# Patient Record
Sex: Female | Born: 1965 | Race: White | Hispanic: No | Marital: Single | Smoking: Current every day smoker
Health system: Southern US, Community
[De-identification: ages and names within clinical notes are randomized; demographics above are authoritative.]

## PROBLEM LIST (undated history)

## (undated) ENCOUNTER — Emergency Department (HOSPITAL_COMMUNITY): Admission: EM | Payer: Self-pay | Source: Home / Self Care

## (undated) DIAGNOSIS — F329 Major depressive disorder, single episode, unspecified: Secondary | ICD-10-CM

## (undated) DIAGNOSIS — R51 Headache: Secondary | ICD-10-CM

## (undated) DIAGNOSIS — IMO0002 Reserved for concepts with insufficient information to code with codable children: Secondary | ICD-10-CM

## (undated) DIAGNOSIS — F32A Depression, unspecified: Secondary | ICD-10-CM

## (undated) DIAGNOSIS — E119 Type 2 diabetes mellitus without complications: Secondary | ICD-10-CM

## (undated) DIAGNOSIS — R87619 Unspecified abnormal cytological findings in specimens from cervix uteri: Secondary | ICD-10-CM

## (undated) DIAGNOSIS — F1120 Opioid dependence, uncomplicated: Secondary | ICD-10-CM

## (undated) DIAGNOSIS — J40 Bronchitis, not specified as acute or chronic: Secondary | ICD-10-CM

## (undated) DIAGNOSIS — K219 Gastro-esophageal reflux disease without esophagitis: Secondary | ICD-10-CM

## (undated) DIAGNOSIS — F1910 Other psychoactive substance abuse, uncomplicated: Secondary | ICD-10-CM

## (undated) HISTORY — DX: Unspecified abnormal cytological findings in specimens from cervix uteri: R87.619

## (undated) HISTORY — DX: Opioid dependence, uncomplicated: F11.20

## (undated) HISTORY — DX: Reserved for concepts with insufficient information to code with codable children: IMO0002

## (undated) HISTORY — PX: DENTAL SURGERY: SHX609

---

## 1998-12-20 ENCOUNTER — Emergency Department (HOSPITAL_COMMUNITY): Admission: EM | Admit: 1998-12-20 | Discharge: 1998-12-20 | Payer: Self-pay | Admitting: *Deleted

## 1998-12-20 ENCOUNTER — Encounter: Payer: Self-pay | Admitting: Emergency Medicine

## 1998-12-21 ENCOUNTER — Encounter: Payer: Self-pay | Admitting: Emergency Medicine

## 1999-10-18 ENCOUNTER — Emergency Department (HOSPITAL_COMMUNITY): Admission: EM | Admit: 1999-10-18 | Discharge: 1999-10-18 | Payer: Self-pay | Admitting: Emergency Medicine

## 1999-10-19 ENCOUNTER — Encounter: Payer: Self-pay | Admitting: *Deleted

## 2002-05-20 ENCOUNTER — Inpatient Hospital Stay (HOSPITAL_COMMUNITY): Admission: EM | Admit: 2002-05-20 | Discharge: 2002-05-24 | Payer: Self-pay | Admitting: Emergency Medicine

## 2002-05-20 ENCOUNTER — Encounter: Payer: Self-pay | Admitting: Oral Surgery

## 2002-05-21 ENCOUNTER — Encounter: Payer: Self-pay | Admitting: Oral Surgery

## 2004-05-22 ENCOUNTER — Emergency Department (HOSPITAL_COMMUNITY): Admission: EM | Admit: 2004-05-22 | Discharge: 2004-05-22 | Payer: Self-pay | Admitting: Emergency Medicine

## 2005-01-07 ENCOUNTER — Emergency Department (HOSPITAL_COMMUNITY): Admission: EM | Admit: 2005-01-07 | Discharge: 2005-01-07 | Payer: Self-pay | Admitting: Emergency Medicine

## 2007-12-06 ENCOUNTER — Emergency Department (HOSPITAL_COMMUNITY): Admission: EM | Admit: 2007-12-06 | Discharge: 2007-12-07 | Payer: Self-pay | Admitting: Emergency Medicine

## 2009-03-24 ENCOUNTER — Emergency Department (HOSPITAL_COMMUNITY): Admission: EM | Admit: 2009-03-24 | Discharge: 2009-03-24 | Payer: Self-pay | Admitting: Family Medicine

## 2010-09-24 LAB — GC/CHLAMYDIA PROBE AMP, GENITAL
Chlamydia, DNA Probe: NEGATIVE
GC Probe Amp, Genital: NEGATIVE

## 2010-09-24 LAB — POCT URINALYSIS DIP (DEVICE)
Bilirubin Urine: NEGATIVE
Glucose, UA: NEGATIVE mg/dL
Ketones, ur: NEGATIVE mg/dL
Nitrite: NEGATIVE
Protein, ur: NEGATIVE mg/dL
Specific Gravity, Urine: 1.02 (ref 1.005–1.030)
Urobilinogen, UA: 0.2 mg/dL (ref 0.0–1.0)
pH: 6 (ref 5.0–8.0)

## 2010-09-24 LAB — WET PREP, GENITAL
Trich, Wet Prep: NONE SEEN
Yeast Wet Prep HPF POC: NONE SEEN

## 2010-09-24 LAB — POCT PREGNANCY, URINE: Preg Test, Ur: NEGATIVE

## 2010-11-06 NOTE — Discharge Summary (Signed)
   Nancy [name], Nancy [name]                         ACCOUNT NO.:  [id number]   MEDICAL RECORD NO.:  [id number]                   PATIENT TYPE:  INP   LOCATION:  5715                                 FACILITY:  [facility]   PHYSICIAN:  Sable Feil., D.D.S.        DATE OF BIRTH:  1966-04-03   DATE OF ADMISSION:  05/20/2002  DATE OF DISCHARGE:  05/23/2002                                 DISCHARGE SUMMARY   ADMISSION DIAGNOSIS:  Right facial infection with draining fistula.   DISCHARGE DIAGNOSIS:  Right facial infection with draining fistula.   HISTORY OF PRESENT ILLNESS:  The patient presented to the emergency  department at [facility] on May 20, 2002,  complaining of right facial swelling and pain.  An examination revealed a  firm cellulitis with an orange-sized swelling in the right face, with a  draining fistula to the skin.  Subsequently the patient was admitted for IV  antibiotics and debridement.  The patient presented with an elevated white  blood cell count and fever, low-grade of 101 degrees.   HOSPITAL COURSE:  Over the course of the next several days, the patient  defervesced and responded to the antibiotics.  The facial swelling  decreased, and the infection resolved.   DISPOSITION:  She was discharged to home on May 23, 2002, with continued  p.o. antibiotics.  The antibiotics was clindamycin 150 mg q.6h. for one  week.  She then was to follow up in the office.  She was also to follow up  in the outpatient clinic for testing for sexually-transmitted diseases, HIV,  and also drug counseling due to her cocaine habit.   CONDITION ON DISCHARGE:  The patient was discharged in good condition.                                                Sable Feil., D.D.S.    JWB/MEDQ  D:  06/25/2002  T:  06/25/2002  Job:  [id number]

## 2011-03-18 LAB — POCT PREGNANCY, URINE
Operator id: 286431
Preg Test, Ur: NEGATIVE

## 2011-07-04 ENCOUNTER — Emergency Department (HOSPITAL_COMMUNITY)
Admission: EM | Admit: 2011-07-04 | Discharge: 2011-07-04 | Disposition: A | Discharge status: Home / Self Care | Payer: Self-pay | Attending: Emergency Medicine | Admitting: Emergency Medicine

## 2011-07-04 ENCOUNTER — Emergency Department (HOSPITAL_COMMUNITY): Payer: Self-pay

## 2011-07-04 ENCOUNTER — Encounter (HOSPITAL_COMMUNITY): Payer: Self-pay | Admitting: *Deleted

## 2011-07-04 DIAGNOSIS — F17200 Nicotine dependence, unspecified, uncomplicated: Secondary | ICD-10-CM | POA: Insufficient documentation

## 2011-07-04 DIAGNOSIS — M7989 Other specified soft tissue disorders: Secondary | ICD-10-CM | POA: Insufficient documentation

## 2011-07-04 DIAGNOSIS — M79609 Pain in unspecified limb: Secondary | ICD-10-CM | POA: Insufficient documentation

## 2011-07-04 DIAGNOSIS — L089 Local infection of the skin and subcutaneous tissue, unspecified: Secondary | ICD-10-CM | POA: Insufficient documentation

## 2011-07-04 MED ORDER — OXYCODONE-ACETAMINOPHEN 5-325 MG PO TABS
1.0000 | ORAL_TABLET | Freq: Once | ORAL | Status: AC
  Administered 2011-07-04: 1 via ORAL

## 2011-07-04 MED ORDER — OXYCODONE-ACETAMINOPHEN 5-325 MG PO TABS
ORAL_TABLET | ORAL | Status: AC
  Filled 2011-07-04: qty 1

## 2011-07-04 MED ORDER — HYDROCODONE-ACETAMINOPHEN 5-325 MG PO TABS: 2.0000 | ORAL_TABLET | ORAL | Status: AC | PRN

## 2011-07-04 MED ORDER — CEPHALEXIN 500 MG PO CAPS: 500.0000 mg | ORAL_CAPSULE | Freq: Four times a day (QID) | ORAL | Status: AC

## 2011-07-04 NOTE — ED Provider Notes (Signed)
History     CSN: [id number]  Arrival date & time 07/04/11  0932   First MD Initiated Contact with Patient 07/04/11 [phone]      No chief complaint on file.   (Consider location/radiation/quality/duration/timing/severity/associated sxs/prior treatment) HPI  46 year old female complaining of left toe/foot pain. For the past 2 months patient has been having pain between her fourth and fifth toe on the left foot. She noticed occasional swelling which comes and goes. She described pain as throbbing, sharp, worsened with walking on palpation. She recently was a spider bite but does not recall seeing it.  She denies pain around her nails. She denies numbness. She has been taking NSAIDs, soak her feet, and wrap with Ace bandage without relief. Patient is here today because pain has worsened. She denies ankle pain.  History reviewed. No pertinent past medical history.  History reviewed. No pertinent past surgical history.  History reviewed. No pertinent family history.  History  Substance Use Topics  . Smoking status: Current Everyday Smoker  . Smokeless tobacco: Not on file  . Alcohol Use: Yes    OB History    Grav Para Term Preterm Abortions TAB SAB Ect Mult Living                  Review of Systems  All other systems reviewed and are negative.    Allergies  Review of patient's allergies indicates no known allergies.  Home Medications   Current Outpatient Rx  Name Route Sig Dispense Refill  . ACETAMINOPHEN 500 MG PO TABS Oral Take 2,000 mg by mouth every 6 (six) hours as needed. For pain.      BP 128/86  Pulse 92  Temp(Src) 98.3 F (36.8 C) (Oral)  Resp 20  SpO2 97%  Physical Exam  Nursing note and vitals reviewed. Constitutional:       Awake, alert, nontoxic appearance  HENT:  Head: Atraumatic.  Eyes: Right eye exhibits no discharge. Left eye exhibits no discharge.  Neck: Neck supple.  Pulmonary/Chest: Effort normal. She exhibits no tenderness.  Abdominal:  There is no tenderness. There is no rebound.  Musculoskeletal: She exhibits no tenderness.       Left ankle: Normal.       Baseline ROM, no obvious new focal weakness  Neurological:       Mental status and motor strength appears baseline for patient and situation  Skin: No rash noted.     Psychiatric: She has a normal mood and affect.    ED Course  Procedures (including critical care time)  Labs Reviewed - No data to display No results found.   No diagnosis found.    MDM  L toe pain x 2 months. Pain suggestive of superficial skin infection.  Will obtain xray to r/o osteo vs. Fb.  No ankle involvement.     1:16 PM X-ray reveals no evidence of osteomyelitis, or fb.  Pt will be treated for skin infection with abx.  I will have pt f/u for further eval. She is able to ambulate and in no acute distress.      Fayrene Helper, PA-C 07/04/11 1317

## 2011-07-04 NOTE — ED Notes (Signed)
Toe pain on lt. Foot between the 4th and 5th. Digits. Injury 6-8 weeks. Believes that she was bitten by a spider. Swelling - off/on. Soaking foot. wrapped with ace bandage. Taking tylenol to control pain.

## 2011-07-06 NOTE — ED Provider Notes (Signed)
Medical screening examination/treatment/procedure(s) were performed by non-physician practitioner and as supervising physician I was immediately available for consultation/collaboration.   Forbes Cellar, MD 07/06/11 [phone]

## 2011-08-17 ENCOUNTER — Emergency Department (HOSPITAL_COMMUNITY): Payer: No Typology Code available for payment source

## 2011-08-17 ENCOUNTER — Emergency Department (HOSPITAL_COMMUNITY)
Admission: EM | Admit: 2011-08-17 | Discharge: 2011-08-17 | Disposition: A | Discharge status: Home / Self Care | Payer: No Typology Code available for payment source | Attending: Emergency Medicine | Admitting: Emergency Medicine

## 2011-08-17 ENCOUNTER — Encounter (HOSPITAL_COMMUNITY): Payer: Self-pay | Admitting: Emergency Medicine

## 2011-08-17 DIAGNOSIS — M25519 Pain in unspecified shoulder: Secondary | ICD-10-CM | POA: Insufficient documentation

## 2011-08-17 DIAGNOSIS — M549 Dorsalgia, unspecified: Secondary | ICD-10-CM | POA: Insufficient documentation

## 2011-08-17 DIAGNOSIS — T1490XA Injury, unspecified, initial encounter: Secondary | ICD-10-CM | POA: Insufficient documentation

## 2011-08-17 DIAGNOSIS — M542 Cervicalgia: Secondary | ICD-10-CM

## 2011-08-17 DIAGNOSIS — M25512 Pain in left shoulder: Secondary | ICD-10-CM

## 2011-08-17 DIAGNOSIS — R209 Unspecified disturbances of skin sensation: Secondary | ICD-10-CM | POA: Insufficient documentation

## 2011-08-17 DIAGNOSIS — Y92410 Unspecified street and highway as the place of occurrence of the external cause: Secondary | ICD-10-CM | POA: Insufficient documentation

## 2011-08-17 MED ORDER — ACETAMINOPHEN 500 MG PO TABS
1000.0000 mg | ORAL_TABLET | Freq: Once | ORAL | Status: AC
  Administered 2011-08-17: 1000 mg via ORAL
  Filled 2011-08-17: qty 2

## 2011-08-17 MED ORDER — IBUPROFEN 800 MG PO TABS: 800.0000 mg | ORAL_TABLET | Freq: Three times a day (TID) | ORAL | Status: AC

## 2011-08-17 MED ORDER — ORPHENADRINE CITRATE ER 100 MG PO TB12: 100.0000 mg | ORAL_TABLET | Freq: Two times a day (BID) | ORAL | Status: AC
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
## 2011-08-18 NOTE — ED Provider Notes (Signed)
Medical screening examination/treatment/procedure(s) were performed by non-physician practitioner and as supervising physician I was immediately available for consultation/collaboration.   Shelda Jakes, MD 08/18/11 2142

## 2011-10-28 ENCOUNTER — Other Ambulatory Visit: Payer: Self-pay | Admitting: Obstetrics and Gynecology

## 2011-10-28 DIAGNOSIS — Z1231 Encounter for screening mammogram for malignant neoplasm of breast: Secondary | ICD-10-CM

## 2011-11-04 ENCOUNTER — Telehealth: Payer: Self-pay

## 2011-11-04 NOTE — Telephone Encounter (Signed)
Called patient and left her a message that we are returning her call.  

## 2011-11-04 NOTE — Telephone Encounter (Signed)
Pt called and stated that she was calling in return of our call.  Please call back. Called pt and left message that were returning her call to please give [place] a call back. Re: pt has appt scheduled with [place] on 11/08/11 @ 230pm for a colpo, referral from [facility]

## 2011-11-05 NOTE — Telephone Encounter (Signed)
Pt notifed.

## 2011-11-08 ENCOUNTER — Encounter: Payer: Self-pay | Admitting: Family Medicine

## 2011-11-08 ENCOUNTER — Ambulatory Visit (HOSPITAL_COMMUNITY)
Admission: RE | Admit: 2011-11-08 | Discharge: 2011-11-08 | Disposition: A | Discharge status: Home / Self Care | Payer: Self-pay | Source: Ambulatory Visit | Attending: Obstetrics and Gynecology | Admitting: Obstetrics and Gynecology

## 2011-11-08 ENCOUNTER — Ambulatory Visit (INDEPENDENT_AMBULATORY_CARE_PROVIDER_SITE_OTHER): Payer: No Typology Code available for payment source | Admitting: Family Medicine

## 2011-11-08 VITALS — BP 107/78 | HR 87 | Temp 98.8°F | Ht 68.0 in | Wt 206.9 lb

## 2011-11-08 DIAGNOSIS — Z1231 Encounter for screening mammogram for malignant neoplasm of breast: Secondary | ICD-10-CM

## 2011-11-08 DIAGNOSIS — Z1239 Encounter for other screening for malignant neoplasm of breast: Secondary | ICD-10-CM

## 2011-11-08 DIAGNOSIS — IMO0002 Reserved for concepts with insufficient information to code with codable children: Secondary | ICD-10-CM

## 2011-11-08 DIAGNOSIS — R87811 Vaginal high risk human papillomavirus (HPV) DNA test positive: Secondary | ICD-10-CM

## 2011-11-08 DIAGNOSIS — Z01812 Encounter for preprocedural laboratory examination: Secondary | ICD-10-CM

## 2011-11-08 LAB — POCT PREGNANCY, URINE: Preg Test, Ur: NEGATIVE

## 2011-11-08 NOTE — Progress Notes (Signed)
Patient referred from the free Cervical Cancer Screening on 09/13/11 for colposcopy for abnormal Pap smear.  Pap Smear:    Pap smear not performed today. Patients last Pap smear was 09/13/11 at the free cervical cancer screening at [facility] and was ASC-H with atypical glands present with a few cells suggestive of a higher grade. Pap smear HPV detected. Per patient she has no history of abnormal Pap smears prior to last Pap smear. Pap smear result above is in EPIC under media. Colposcopy appointment scheduled following this appointment.  Physical exam: Breasts Breasts symmetrical. No skin abnormalities bilateral breast. No nipple retraction bilateral breasts. No nipple discharge bilateral breasts. No lymphadenopathy. No lumps palpated bilateral breasts. No complaints of pain or tenderness on exam.        Pelvic/Bimanual No Pap smear completed today since last Pap smear was 09/13/11. Pap smear not indicated per BCCCP guidelines.   Patient Instructions Taught patient how to perform BSE and gave educational materials to take home. Patient did not need a Pap smear today due to last Pap smear was 09/13/11. Told patient next Pap smear will be based on the result to her colposcopy today. Patient scheduled for a screening mammogram at mammography following colposcopy appointment. Patient aware of appointment and will be there. Let patient know will follow up with her within the next couple weeks with results. Talked with patient about smoking cessation. Told patient about free smoking cessation classes offered at the [facility]. Also, gave patient other quit smoking resources in the community. Patient verbalized understanding.

## 2011-11-08 NOTE — Patient Instructions (Signed)
Colposcopy Care After Colposcopy is a procedure in which a special tool is used to magnify the surface of the cervix. A tissue sample (biopsy) may also be taken. This sample will be looked at for cervical cancer or other problems. After the test:  You may have some cramping.   Lie down for a few minutes if you feel lightheaded.    You may have some bleeding which should stop in a few days.  HOME CARE  Do not have sex or use tampons for 2 to 3 days or as told.   Only take medicine as told by your doctor.   Continue to take your birth control pills as usual.  Finding out the results of your test Ask when your test results will be ready. Make sure you get your test results. GET HELP RIGHT AWAY IF:  You are bleeding a lot or are passing blood clots.   You develop a fever of 102 F (38.9 C) or higher.   You have abnormal vaginal discharge.   You have cramps that do not go away with medicine.   You feel lightheaded, dizzy, or pass out (faint).  MAKE SURE YOU:   Understand these instructions.   Will watch your condition.   Will get help right away if you are not doing well or get worse.  Document Released: 11/24/2007 Document Revised: 05/27/2011 Document Reviewed: 11/24/2007 [employer] Patient Information 2012 [employer]. 

## 2011-11-08 NOTE — Progress Notes (Signed)
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  Patient given informed consent, signed copy in the chart, time out was performed.  Placed in lithotomy position. Cervix viewed with speculum and colposcope after application of acetic acid.   Colposcopy adequate?  yes Acetowhite lesions?12, 2 o'clock Punctation?12 o'clock Mosaicism?  no Abnormal vasculature?  no Biopsies?12, 2 o'clock ECC?yes  Patient was given post procedure instructions.  She will return in 3-4 weeks for results.

## 2011-11-10 ENCOUNTER — Other Ambulatory Visit: Payer: Self-pay | Admitting: Obstetrics and Gynecology

## 2011-11-10 DIAGNOSIS — R928 Other abnormal and inconclusive findings on diagnostic imaging of breast: Secondary | ICD-10-CM

## 2011-11-18 ENCOUNTER — Other Ambulatory Visit: Payer: No Typology Code available for payment source

## 2011-11-24 ENCOUNTER — Ambulatory Visit (INDEPENDENT_AMBULATORY_CARE_PROVIDER_SITE_OTHER): Payer: No Typology Code available for payment source | Admitting: Family Medicine

## 2011-11-24 ENCOUNTER — Ambulatory Visit (INDEPENDENT_AMBULATORY_CARE_PROVIDER_SITE_OTHER): Payer: No Typology Code available for payment source | Admitting: Obstetrics and Gynecology

## 2011-11-24 ENCOUNTER — Encounter: Payer: Self-pay | Admitting: Family Medicine

## 2011-11-24 VITALS — BP 103/71 | HR 82 | Temp 97.6°F | Ht 68.5 in | Wt 203.9 lb

## 2011-11-24 DIAGNOSIS — A499 Bacterial infection, unspecified: Secondary | ICD-10-CM

## 2011-11-24 DIAGNOSIS — N760 Acute vaginitis: Secondary | ICD-10-CM

## 2011-11-24 DIAGNOSIS — B9689 Other specified bacterial agents as the cause of diseases classified elsewhere: Secondary | ICD-10-CM | POA: Insufficient documentation

## 2011-11-24 DIAGNOSIS — D069 Carcinoma in situ of cervix, unspecified: Secondary | ICD-10-CM | POA: Insufficient documentation

## 2011-11-24 DIAGNOSIS — Z01812 Encounter for preprocedural laboratory examination: Secondary | ICD-10-CM

## 2011-11-24 LAB — POCT PREGNANCY, URINE: Preg Test, Ur: NEGATIVE

## 2011-11-24 NOTE — Progress Notes (Signed)
  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  Her colposcopy was 11/08/11.  Patient given informed consent, signed copy in the chart, time out was performed.  Placed in lithotomy position. Cervix viewed with speculum and colposcope after application of acetic acid.  Two lesions adjacent and extending into external cervical os at 12 and 6 o'clock.  After application of lugol's solution, has large areas of no uptake incorporating much of the visible cervix.  Dr Marice Potter was consulted, who agreed that [facility] biopsy was more appropriate for the patient.  Explained this to the patient.  Will schedule the patient for this as soon as possible.

## 2011-11-24 NOTE — Progress Notes (Signed)
Patient presenting today to discuss results of recent colposcopy. 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 Patient informed of these findings and need for excisional biopsy. Patient verbalized understanding and all questions were answered. Patient will be scheduled for a LEEP next available.

## 2011-11-25 ENCOUNTER — Telehealth: Payer: Self-pay | Admitting: *Deleted

## 2011-11-25 LAB — WET PREP, GENITAL
Clue Cells Wet Prep HPF POC: NONE SEEN
Trich, Wet Prep: NONE SEEN
WBC, Wet Prep HPF POC: NONE SEEN
Yeast Wet Prep HPF POC: NONE SEEN

## 2011-11-25 NOTE — Telephone Encounter (Signed)
Patient notified that her wet prep was  Normal.

## 2011-11-25 NOTE — Telephone Encounter (Signed)
Pt called [place] back. I returned her phone call but got no answer.

## 2011-11-25 NOTE — Telephone Encounter (Signed)
Patient called stating wanted to know if Doctor ran test yesterday for BV.

## 2011-11-25 NOTE — Telephone Encounter (Signed)
Telephoned patient at home # and left message to return call to clinic.  

## 2011-11-29 ENCOUNTER — Encounter (HOSPITAL_COMMUNITY): Payer: Self-pay | Admitting: Pharmacist

## 2011-11-30 ENCOUNTER — Encounter (HOSPITAL_COMMUNITY): Payer: Self-pay | Admitting: *Deleted

## 2011-12-02 ENCOUNTER — Telehealth: Payer: Self-pay | Admitting: *Deleted

## 2011-12-02 NOTE — Telephone Encounter (Signed)
Telephoned patient at home # and spoke with patient. Will meet with patient Friday June 14 10:00 to complete [employer] paperwork.

## 2011-12-03 ENCOUNTER — Telehealth: Payer: Self-pay | Admitting: *Deleted

## 2011-12-03 NOTE — Telephone Encounter (Signed)
Spoke with patient and told to come in on Monday 12/06/11 at 9am.  Patient stated understanding.

## 2011-12-03 NOTE — Telephone Encounter (Signed)
Patient called and left a message stating she would like for Dr.Dove to call her - she has questions and would like a call before her procedure on the 24th." I would be grateful if she would call me back."

## 2011-12-03 NOTE — Telephone Encounter (Signed)
Consult w/Dr. Marice Potter re: pt's request.  She would like the pt to come to clinic for face to face meeting. Pt notified that she will be called with an appt time to speak w/Dr. Marice Potter next week.  Pt voiced understanding.  Call message sent to Mayra Neer- clinic manager for appt scheduling.

## 2011-12-06 ENCOUNTER — Telehealth: Payer: Self-pay

## 2011-12-06 ENCOUNTER — Ambulatory Visit (INDEPENDENT_AMBULATORY_CARE_PROVIDER_SITE_OTHER): Payer: No Typology Code available for payment source | Admitting: Obstetrics & Gynecology

## 2011-12-06 ENCOUNTER — Encounter: Payer: Self-pay | Admitting: Family Medicine

## 2011-12-06 VITALS — BP 123/77 | HR 73 | Temp 96.6°F | Ht 68.0 in | Wt 205.0 lb

## 2011-12-06 DIAGNOSIS — N898 Other specified noninflammatory disorders of vagina: Secondary | ICD-10-CM

## 2011-12-06 DIAGNOSIS — R32 Unspecified urinary incontinence: Secondary | ICD-10-CM

## 2011-12-06 LAB — WET PREP, GENITAL
Trich, Wet Prep: NONE SEEN
Yeast Wet Prep HPF POC: NONE SEEN

## 2011-12-06 NOTE — Telephone Encounter (Signed)
Patient called stating that "I received a call from the clinic I believe it was from [place] about an appointment and to give y'all a call back."

## 2011-12-06 NOTE — Progress Notes (Signed)
  Subjective:    Patient ID: Nancy [name], female    DOB: 1966/04/10, 46 y.o.   MRN: [id number]  HPI  Nancy [name] is here today for 3 reasons. She questioned why she has to have a CKC instead of a hysterectomy. I explained that a diagnosis is needed to rule out cervical cancer. She also tells me that she started with GSUI years ago but this has progressed to daily incontinence even with movements. She denies any significant amount of urge incontinence. She wears pads daily for this reason and also complains of vaginal "irritation" and an occasional vaginal odor. She used vagisil yestereday.  Review of Systems     Objective:   Physical Exam  Normal appearing vagina and scant discharge. There is a freckle-like discoloration of her uppermost cervix at the 11 o'clock position. (I will biopsy it at the time of her CKC.)      Assessment & Plan:  Incontinence- refer to urology Her CKC is scheduled for next Monday.

## 2011-12-06 NOTE — Telephone Encounter (Signed)
Called pt and left message to return the call to the clinics. Pt needs to be informed that her referral inform was faxed to [facility] and that someone should contact her from [facility] concerning an appt.

## 2011-12-06 NOTE — Telephone Encounter (Signed)
Called pt and left message to return the call to the clinics.  Pt needs to be informed that her referral inform was sent to [facility] and that someone should contact her from [facility] concerning an appt.

## 2011-12-07 LAB — GC/CHLAMYDIA PROBE AMP, GENITAL
Chlamydia, DNA Probe: NEGATIVE
GC Probe Amp, Genital: NEGATIVE

## 2011-12-08 NOTE — Telephone Encounter (Signed)
Called pt and informed her that she will be receiving a call from [facility].  Pt also asked about her mammo appt. I informed her that she is scheduled for mammo and breast US on 6/21 @ 1040.  Pt voiced understanding of all information given.

## 2011-12-10 ENCOUNTER — Ambulatory Visit
Admission: RE | Admit: 2011-12-10 | Discharge: 2011-12-10 | Disposition: A | Discharge status: Home / Self Care | Payer: No Typology Code available for payment source | Source: Ambulatory Visit | Attending: Obstetrics and Gynecology | Admitting: Obstetrics and Gynecology

## 2011-12-10 DIAGNOSIS — R928 Other abnormal and inconclusive findings on diagnostic imaging of breast: Secondary | ICD-10-CM

## 2011-12-13 ENCOUNTER — Ambulatory Visit (HOSPITAL_COMMUNITY)
Admission: RE | Admit: 2011-12-13 | Discharge: 2011-12-13 | Disposition: A | Discharge status: Home / Self Care | Payer: Medicaid Other | Source: Ambulatory Visit | Attending: Obstetrics & Gynecology | Admitting: Obstetrics & Gynecology

## 2011-12-13 ENCOUNTER — Ambulatory Visit (HOSPITAL_COMMUNITY): Payer: Medicaid Other | Admitting: Anesthesiology

## 2011-12-13 ENCOUNTER — Encounter (HOSPITAL_COMMUNITY): Payer: Self-pay | Admitting: Anesthesiology

## 2011-12-13 ENCOUNTER — Encounter (HOSPITAL_COMMUNITY)
Admission: RE | Disposition: A | Discharge status: Home / Self Care | Payer: Self-pay | Source: Ambulatory Visit | Attending: Obstetrics & Gynecology

## 2011-12-13 ENCOUNTER — Encounter (HOSPITAL_COMMUNITY): Payer: Self-pay | Admitting: *Deleted

## 2011-12-13 DIAGNOSIS — D069 Carcinoma in situ of cervix, unspecified: Secondary | ICD-10-CM

## 2011-12-13 DIAGNOSIS — Z006 Encounter for examination for normal comparison and control in clinical research program: Secondary | ICD-10-CM | POA: Insufficient documentation

## 2011-12-13 HISTORY — DX: Headache: R51

## 2011-12-13 HISTORY — DX: Gastro-esophageal reflux disease without esophagitis: K21.9

## 2011-12-13 HISTORY — PX: CERVICAL CONIZATION W/BX: SHX1330

## 2011-12-13 LAB — CBC
HCT: 41.3 % (ref 36.0–46.0)
Hemoglobin: 13.8 g/dL (ref 12.0–15.0)
MCH: 29.8 pg (ref 26.0–34.0)
MCHC: 33.4 g/dL (ref 30.0–36.0)
MCV: 89.2 fL (ref 78.0–100.0)
Platelets: 270 10*3/uL (ref 150–400)
RBC: 4.63 MIL/uL (ref 3.87–5.11)
RDW: 12.6 % (ref 11.5–15.5)
WBC: 12.5 10*3/uL — ABNORMAL HIGH (ref 4.0–10.5)

## 2011-12-13 LAB — PREGNANCY, URINE: Preg Test, Ur: NEGATIVE

## 2011-12-13 SURGERY — CONE BIOPSY, CERVIX
Anesthesia: General | Site: Vagina | Wound class: Clean Contaminated

## 2011-12-13 MED ORDER — BUPIVACAINE HCL (PF) 0.5 % IJ SOLN
INTRAMUSCULAR | Status: DC | PRN
  Administered 2011-12-13: 10 mL

## 2011-12-13 MED ORDER — MEPERIDINE HCL 25 MG/ML IJ SOLN: 6.2500 mg | INTRAMUSCULAR | Status: DC | PRN

## 2011-12-13 MED ORDER — DEXAMETHASONE SODIUM PHOSPHATE 10 MG/ML IJ SOLN
INTRAMUSCULAR | Status: AC
  Filled 2011-12-13: qty 1

## 2011-12-13 MED ORDER — MIDAZOLAM HCL 5 MG/5ML IJ SOLN
INTRAMUSCULAR | Status: DC | PRN
  Administered 2011-12-13: 2 mg via INTRAVENOUS

## 2011-12-13 MED ORDER — ONDANSETRON HCL 4 MG/2ML IJ SOLN: 4.0000 mg | Freq: Once | INTRAMUSCULAR | Status: DC | PRN

## 2011-12-13 MED ORDER — ONDANSETRON HCL 4 MG/2ML IJ SOLN
INTRAMUSCULAR | Status: DC | PRN
  Administered 2011-12-13: 4 mg via INTRAVENOUS

## 2011-12-13 MED ORDER — ONDANSETRON HCL 4 MG/2ML IJ SOLN
INTRAMUSCULAR | Status: AC
  Filled 2011-12-13: qty 2

## 2011-12-13 MED ORDER — IBUPROFEN 800 MG PO TABS: 800.0000 mg | ORAL_TABLET | Freq: Three times a day (TID) | ORAL | Status: AC | PRN

## 2011-12-13 MED ORDER — LACTATED RINGERS IV SOLN
INTRAVENOUS | Status: DC
  Administered 2011-12-13 (×2): via INTRAVENOUS

## 2011-12-13 MED ORDER — KETOROLAC TROMETHAMINE 30 MG/ML IJ SOLN
INTRAMUSCULAR | Status: DC | PRN
  Administered 2011-12-13: 30 mg via INTRAVENOUS

## 2011-12-13 MED ORDER — PROPOFOL 10 MG/ML IV EMUL
INTRAVENOUS | Status: AC
  Filled 2011-12-13: qty 20

## 2011-12-13 MED ORDER — MIDAZOLAM HCL 2 MG/2ML IJ SOLN
INTRAMUSCULAR | Status: AC
  Filled 2011-12-13: qty 2

## 2011-12-13 MED ORDER — IODINE STRONG (LUGOLS) 5 % PO SOLN
ORAL | Status: DC | PRN
  Administered 2011-12-13: 0.1 mL

## 2011-12-13 MED ORDER — BUPIVACAINE HCL (PF) 0.5 % IJ SOLN
INTRAMUSCULAR | Status: AC
  Filled 2011-12-13: qty 30

## 2011-12-13 MED ORDER — DEXAMETHASONE SODIUM PHOSPHATE 4 MG/ML IJ SOLN
INTRAMUSCULAR | Status: DC | PRN
  Administered 2011-12-13: 5 mg via INTRAVENOUS

## 2011-12-13 MED ORDER — LIDOCAINE HCL (CARDIAC) 20 MG/ML IV SOLN
INTRAVENOUS | Status: AC
  Filled 2011-12-13: qty 5

## 2011-12-13 MED ORDER — KETOROLAC TROMETHAMINE 30 MG/ML IJ SOLN: 15.0000 mg | Freq: Once | INTRAMUSCULAR | Status: DC | PRN

## 2011-12-13 MED ORDER — FENTANYL CITRATE 0.05 MG/ML IJ SOLN
INTRAMUSCULAR | Status: DC | PRN
  Administered 2011-12-13: 50 ug via INTRAVENOUS

## 2011-12-13 MED ORDER — LIDOCAINE HCL (CARDIAC) 20 MG/ML IV SOLN
INTRAVENOUS | Status: DC | PRN
  Administered 2011-12-13: 60 mg via INTRAVENOUS

## 2011-12-13 MED ORDER — PROPOFOL 10 MG/ML IV EMUL
INTRAVENOUS | Status: DC | PRN
  Administered 2011-12-13: 160 mg via INTRAVENOUS

## 2011-12-13 MED ORDER — FENTANYL CITRATE 0.05 MG/ML IJ SOLN
INTRAMUSCULAR | Status: AC
  Filled 2011-12-13: qty 2

## 2011-12-13 MED ORDER — FENTANYL CITRATE 0.05 MG/ML IJ SOLN: 25.0000 ug | INTRAMUSCULAR | Status: DC | PRN

## 2011-12-13 SURGICAL SUPPLY — 27 items
BLADE SURG 11 STRL SS (BLADE) ×2 IMPLANT
CLOTH BEACON ORANGE TIMEOUT ST (SAFETY) ×2 IMPLANT
CONTAINER PREFILL 10% NBF 60ML (FORM) ×5 IMPLANT
COUNTER NEEDLE 1200 MAGNETIC (NEEDLE) ×2 IMPLANT
DILATOR CANAL MILEX (MISCELLANEOUS) IMPLANT
ELECT REM PT RETURN 9FT ADLT (ELECTROSURGICAL) ×2
ELECTRODE REM PT RTRN 9FT ADLT (ELECTROSURGICAL) ×1 IMPLANT
GLOVE BIO SURGEON STRL SZ 6.5 (GLOVE) ×4 IMPLANT
GLOVE BIOGEL PI IND STRL 6 (GLOVE) ×1 IMPLANT
GLOVE BIOGEL PI INDICATOR 6 (GLOVE) ×1
GOWN PREVENTION PLUS LG XLONG (DISPOSABLE) ×4 IMPLANT
NDL SPNL 18GX3.5 QUINCKE PK (NEEDLE) ×1 IMPLANT
NEEDLE SPNL 18GX3.5 QUINCKE PK (NEEDLE) ×2 IMPLANT
PACK VAGINAL MINOR WOMEN LF (CUSTOM PROCEDURE TRAY) ×2 IMPLANT
PENCIL BUTTON HOLSTER BLD 10FT (ELECTRODE) ×2 IMPLANT
SCOPETTES 8  STERILE (MISCELLANEOUS) ×1
SCOPETTES 8 STERILE (MISCELLANEOUS) ×1 IMPLANT
SPONGE SURGIFOAM ABS GEL 12-7 (HEMOSTASIS) IMPLANT
SUT CHROMIC 0 CT 1 (SUTURE) IMPLANT
SUT VIC AB 0 CT1 27 (SUTURE) ×2
SUT VIC AB 0 CT1 27XBRD ANBCTR (SUTURE) IMPLANT
SUT VICRYL 0 UR6 27IN ABS (SUTURE) ×4 IMPLANT
SYR CONTROL 10ML LL (SYRINGE) ×2 IMPLANT
TOWEL OR 17X24 6PK STRL BLUE (TOWEL DISPOSABLE) ×4 IMPLANT
TUBING NON-CON 1/4 X 20 CONN (TUBING) ×2 IMPLANT
WATER STERILE IRR 1000ML POUR (IV SOLUTION) ×2 IMPLANT
YANKAUER SUCT BULB TIP NO VENT (SUCTIONS) ×2 IMPLANT

## 2011-12-13 NOTE — Op Note (Signed)
12/13/2011  10:42 AM  PATIENT:  Nancy [name]  46 y.o. female  PRE-OPERATIVE DIAGNOSIS:  Cervical Intra-epithelial neoplasia 3/CIS  POST-OPERATIVE DIAGNOSIS: same  PROCEDURE:  Procedure(s) (LRB): CONIZATION CERVIX WITH BIOPSY (N/A)  SURGEON:  Surgeon(s) and Role:    * Allie Bossier, MD - Primary  PHYSICIAN ASSISTANT:   ASSISTANTS: none   ANESTHESIA:   general  EBL:  Total I/O In: 1000 [I.V.:1000] Out: -   BLOOD ADMINISTERED:none  DRAINS: none   LOCAL MEDICATIONS USED:  MARCAINE     SPECIMEN:  Source of Specimen:  1) cervical biopsy 2) CKC 3) ECC  DISPOSITION OF SPECIMEN:  PATHOLOGY  COUNTS:  YES  TOURNIQUET:  * No tourniquets in log *  DICTATION: .Dragon Dictation  PLAN OF CARE: Discharge to home after PACU  PATIENT DISPOSITION:  PACU - hemodynamically stable.   Delay start of Pharmacological VTE agent (>24hrs) due to surgical blood loss or risk of bleeding: not applicable  The risks, benefits, and alternatives of surgery were explained, understood, and accepted. She was taken to the operating room and placed in the dorsal lithotomy position. Her vagina was prepped and draped in the usual sterile fashion. A timeout was done. A bimanual exam revealed a ULN  size and shape, anteverted mobile uterus. Nonenlarged adnexa were noted.  A weighted speculum was placed in the posterior aspect the vagina and the cervix was visualized. There were 2 small brown lesions on the cervix, nearing the cervicovaginal junction. I used cervical biopsy forceps to biopsy/almost entirely remove the 11 o'clock lesion. The anterior lip of the cervix was grasped with a single-tooth tenaculum, and a paracervical block was done with 20 mL of 0.5% Marcaine. A cone shaped specimen of the cervix was removed and endocervical curettings were obtained. The cone bed was cauterized with the Bovie. Hemostasis was noted. A pursestring closure of the cervix was done with a 0 Vicryl suture. Excellent hemostasis  was noted. The instrument, sponge, and needle counts were correct. She was taken to the recovery room in stable condition. She tolerated the procedure well.

## 2011-12-13 NOTE — Discharge Instructions (Signed)
Conization of the Cervix Conization is the cutting (excision) of a cone-shaped portion of the cervix. This procedure is usually done when there is abnormal bleeding from the cervix. It can also be done to evaluate an abnormal Pap smear or if an abnormality is seen on the cervix during an exam. Conization of the cervix is not done during a menstrual period or pregnancy. BEFORE THE PROCEDURE  Do not eat or drink anything for 6 to 8 hours before the procedure, especially if you are going to be given a drug to make you sleep (general anesthetic).   Do not take aspirin or blood thinners for at least a week before the procedure, or as directed.   Arrive at least an hour before the procedure to read and sign the necessary forms.   Arrange for someone to take you home after the procedure.   If you smoke, do not smoke for 2 weeks before the procedure.  LET YOUR CAREGIVER KNOW THE FOLLOWING:  Allergies to food or medications.   All the medications you are taking including over-the-counter and prescription medications, herbs, eyedrops, and creams.   You develop a cold or an infection.   If you are using illegal drugs or drinking too much alcohol.   Your smoking habits.   Previous problems with anesthetics including novocaine.   The possibility of being pregnant.   History of blood clots or other bleeding problems.   Other medical problems.  RISKS AND COMPLICATIONS   Bleeding.   Infection.   Damage to the cervix.   Injury to surrounding organs.   Problems with the anesthesia.  PROCEDURE Conization of the cervix can be done by:  Cold knife. This type cuts out the cervical canal and the transformation zone (where the normal cells end and the abnormal cells begin) with a scalpel.   LEEP (electrocautery). This type is done with a thin wire that can cut and burn (cauterize) the cervical tissue with an electrical current.   Laser treatment. This type cuts and burns (cauterizes) the  tissue of the cervix to prevent bleeding with a laser beam.  You will be given a drug to make you sleep (general anesthetic) for cold knife and laser treatments, and you will be given a numbing medication (local anesthetic) for the LEEP procedure. Conization is usually done using colposcopy. Colposcopy magnifies the cervix to see it more clearly. The tissue removed is examined under a microscope by a doctor (pathologist). The pathologist will provide a report to your caregiver. This will help your caregiver decide if further treatment is necessary. This report will also help your caregiver decide on the best treatment if your results are abnormal.  AFTER THE PROCEDURE  If you had a general anesthetic, you may be groggy for a couple of hours after the procedure.   If you had a local anesthetic, you will be advised to rest at the surgical center or caregiver's office until you are stable and feel ready to go home.   Have someone take you home.   You may have some cramping for a couple of days.   You may have a bloody discharge or light bleeding for 2 to 4 weeks.   You may have a black discharge coming from the vagina. This is from the paste used on the cervix to prevent bleeding. This is normal discharge.  HOME CARE INSTRUCTIONS   Do not drive for 24 hours.   Avoid strenuous activities and exercises for at least 7 -   10 days.   Only take over-the-counter or prescription medicines for pain, discomfort, or fever as directed by your caregiver.   Do not take aspirin. It can cause or aggravate bleeding.   You may resume your usual diet.   Drink 6 to 8 glasses of fluid a day.   Rest and sleep the first 24 to 48 hours.   Take showers instead of baths until your caregiver gives you the okay.   Do not use tampons, douche or have intercourse for 4 weeks, or as advised by your caregiver.   Do not lift anything over 10 pounds (4.5 kg) for at least 7 to 10 days, or as advised by your caregiver.    Take your temperature twice a day, for 4 to 5 days. Write them down.   Do not drink or drive when taking pain medication.   If you develop constipation:   Take a mild laxative with the advice of your caregiver.   Eat bran foods.   Drink a lot of fluids.   Try to have someone with you or available for you the first 24 to 48 hours, especially if you had a general anesthetic.   Make sure you and your family understand everything about your surgery and recovery.   Follow your caregiver's advice regarding follow-up appointments and Pap smears.  SEEK MEDICAL CARE IF:   You develop a rash.   You are dizzy or light-headed.   You feel sick to your stomach (nauseous).   You develop a bad smelling vaginal discharge.   You have an abnormal reaction or allergy to your medication.   You need stronger pain medication.  SEEK IMMEDIATE MEDICAL CARE IF:   You have blood clots or bleeding that is heavier than a normal menstrual period, or you develop bright red bleeding.   An oral temperature above 102 F (38.9 C) develops and persists for several hours.   You have increasing cramps.   You pass out.   You have painful or bloody urine.   You start throwing up (vomiting).   The pain is not relieved with your pain medication.  Not all test results are available during your visit. If your test results are not back during your the visit, make an appointment with your caregiver to find out the results. Do not assume everything is normal if you have not heard from your caregiver or the medical facility. It is important for you to follow up on all of your test results. Document Released: 03/17/2005 Document Revised: 05/27/2011 Document Reviewed: 01/06/2009 [facility] Patient Information 2012 [place], [place].DISCHARGE INSTRUCTIONS: D&C / D&E The following instructions have been prepared to help you care for yourself upon your return home.   Personal hygiene: Marland Kitchen Use sanitary pads for vaginal  drainage, not tampons. . Shower the day after your procedure. . NO tub baths, pools or Jacuzzis for 2-3 weeks. . Wipe front to back after using the bathroom.  Activity and limitations: . Do NOT drive or operate any equipment for 24 hours. The effects of anesthesia are still present and drowsiness may result. . Do NOT rest in bed all day. . Walking is encouraged. . Walk up and down stairs slowly. . You may resume your normal activity in one to two days or as indicated by your physician.  Sexual activity: NO intercourse for at least 2 weeks after the procedure, or as indicated by your physician.  Diet: Eat a light meal as desired this evening. You may resume your  usual diet tomorrow.  Return to work: You may resume your work activities in one to two days or as indicated by your doctor.  What to expect after your surgery: Expect to have vaginal bleeding/discharge for 2-3 days and spotting for up to 10 days. It is not unusual to have soreness for up to 1-2 weeks. You may have a slight burning sensation when you urinate for the first day. Mild cramps may continue for a couple of days. You may have a regular period in 2-6 weeks.  Call your doctor for any of the following: . Excessive vaginal bleeding, saturating and changing one pad every hour. . Inability to urinate 6 hours after discharge from hospital. . Pain not relieved by pain medication. . Fever of 100.4 F or greater. . Unusual vaginal discharge or odor.  Return to office ________________ Call for an appointment ___________________  Patient's signature: ______________________  Nurse's signature ________________________  Post Anesthesia Care Unit [phone]

## 2011-12-13 NOTE — Anesthesia Preprocedure Evaluation (Signed)
Anesthesia Evaluation  Patient identified by MRN, date of birth, ID band Patient awake    Reviewed: Allergy & Precautions, H&P , NPO status , Patient's Chart, lab work & pertinent test results  Airway Mallampati: I TM Distance: >3 FB Neck ROM: full    Dental  (+) Teeth Intact and Poor Dentition   Pulmonary neg pulmonary ROS,    Pulmonary exam normal       Cardiovascular negative cardio ROS      Neuro/Psych negative psych ROS   GI/Hepatic Neg liver ROS,   Endo/Other  negative endocrine ROS  Renal/GU negative Renal ROS  negative genitourinary   Musculoskeletal negative musculoskeletal ROS (+)   Abdominal Normal abdominal exam  (+)   Peds negative pediatric ROS (+)  Hematology negative hematology ROS (+)   Anesthesia Other Findings   Reproductive/Obstetrics negative OB ROS                           Anesthesia Physical Anesthesia Plan  ASA: II  Anesthesia Plan: General   Post-op Pain Management:    Induction: Intravenous  Airway Management Planned: LMA  Additional Equipment:   Intra-op Plan:   Post-operative Plan: Extubation in OR  Informed Consent: I have reviewed the patients History and Physical, chart, labs and discussed the procedure including the risks, benefits and alternatives for the proposed anesthesia with the patient or authorized representative who has indicated his/her understanding and acceptance.     Plan Discussed with: CRNA and Surgeon  Anesthesia Plan Comments:         Anesthesia Quick Evaluation

## 2011-12-13 NOTE — Transfer of Care (Signed)
Immediate Anesthesia Transfer of Care Note  Patient: Nancy [name]  Procedure(s) Performed: Procedure(s) (LRB): CONIZATION CERVIX WITH BIOPSY (N/A)  Patient Location: PACU  Anesthesia Type: General  Level of Consciousness: awake, alert  and oriented  Airway & Oxygen Therapy: Patient Spontanous Breathing and Patient connected to nasal cannula oxygen  Post-op Assessment: Report given to PACU RN and Post -op Vital signs reviewed and stable  Post vital signs: Reviewed and stable  Complications: No apparent anesthesia complications

## 2011-12-13 NOTE — H&P (Signed)
  46 yo SW G1P1 (77 yo daughter) who has CIN 3/CIS seen on cervical biopsy. She was scheduled to have a LEEP in the office, but the lesion was too large.  PMH- asthma, migraines, GERD  Meds- IBU prn, Tums  NKDA  SH- smokes 1 ppd, no drug use for 5 months  ROS-uses condoms for birth control  FH- no breast ca, mother with "cervical cancer" at 38 yo  PE- [facility] NAD  Heart- rrr Lungs- CTAB Abd- benign  A/P. CIN3/CIS- plan for CKC, ECC.

## 2011-12-13 NOTE — Anesthesia Postprocedure Evaluation (Signed)
Anesthesia Post Note  Patient: Nancy [name]  Procedure(s) Performed: Procedure(s) (LRB): CONIZATION CERVIX WITH BIOPSY (N/A)  Anesthesia type: General  Patient location: PACU  Post pain: Pain level controlled  Post assessment: Post-op Vital signs reviewed  Last Vitals:  Filed Vitals:   12/13/11 1100  BP: 95/59  Pulse: 69  Temp:   Resp: 18    Post vital signs: Reviewed  Level of consciousness: sedated  Complications: No apparent anesthesia complicationsfj

## 2011-12-14 ENCOUNTER — Encounter (HOSPITAL_COMMUNITY): Payer: Self-pay | Admitting: Obstetrics & Gynecology

## 2011-12-21 ENCOUNTER — Telehealth: Payer: Self-pay | Admitting: *Deleted

## 2011-12-21 NOTE — Telephone Encounter (Signed)
Results of ultrasound was discussed with patient.

## 2011-12-27 ENCOUNTER — Telehealth: Payer: Self-pay | Admitting: *Deleted

## 2011-12-27 NOTE — Telephone Encounter (Signed)
Follow up appt should be 2-4 weeks after surgery which was 12/13/11

## 2011-12-27 NOTE — Telephone Encounter (Signed)
Called Matisha back and she states is having discharge- not sure of color because of urine incontinence and bleeding-but thinks is beige, states same since had surgery- states bleeding is coming and going- states not that much , states odor has came and went since surgery but it is foul, states it is not as foul now.

## 2011-12-27 NOTE — Telephone Encounter (Signed)
Send to front desk to call re: appt

## 2011-12-27 NOTE — Telephone Encounter (Signed)
Discussed patient complaints with Dr. Macon Large- informed patient her complaints are all normal- discharge/bleeding/odor can last for several weeks after coinization, but she should have an appt for follow up after surgery- will ask front office to call her tomorrow to make an appointment for follow up after surgery. Patient also states someone called her a few weeks ago about her urology appointment, but she doesn't remember when or where- per chart review informed patient was referred to [facility] and they were supposed to call her with an appt. Informed her we would follow up with [facility] tomorrow or asap and call her back. Patient has orange card and was told [facility] would take that- informed her I wasn't sure about that- also she says she now has Medicaid- told her wasn't sure they would take that, but we would call her back once we found out- patient states can't afford $250 and also will be having a 30 day inpatient stay soon, not sure exactly when.

## 2011-12-27 NOTE — Telephone Encounter (Signed)
Nancy [name] called and left a message that she had a procedure on 24th with Dr. Marice Potter - conization- and is having a discharge with a bad odor- requests a call back

## 2011-12-30 NOTE — Telephone Encounter (Signed)
Still need to follow up with pt. Urology appt and call her

## 2011-12-30 NOTE — Telephone Encounter (Signed)
Called [facility] and verified they did not receive a referral- referral made and appointment received- for 01/26/12 at 1000 with Dr. Vernie Ammons- [facility] accepts Medicaid, but not orange card- verify patient has medicaid- tell patient they will mail her new patient paperwork to bring to appt along with picture ID, medicaid card, medications. Called patient and left a message we are calling with some information- please call clinic - also need to tell her of fu appt with Dr. Marice Potter

## 2011-12-30 NOTE — Telephone Encounter (Signed)
Called Landy and left a message we are trying to call you back with the information/appt we discussed earlier- please call back during office hours and if you would like [place] to leave you a detailed message with your infomration please tell us on your message.

## 2012-01-05 NOTE — Telephone Encounter (Signed)
Called pt and left message that we have several attempts on trying to contact her its concerning an appt and that if she could give [place] a call back and that we will send out a certified letter.  Will route to [place] to please send certified letter.

## 2012-01-06 ENCOUNTER — Encounter: Payer: Self-pay | Admitting: *Deleted

## 2012-01-10 ENCOUNTER — Ambulatory Visit: Payer: Self-pay | Admitting: Obstetrics & Gynecology

## 2012-01-25 ENCOUNTER — Encounter (HOSPITAL_BASED_OUTPATIENT_CLINIC_OR_DEPARTMENT_OTHER): Payer: Self-pay | Admitting: *Deleted

## 2012-01-25 ENCOUNTER — Emergency Department (HOSPITAL_BASED_OUTPATIENT_CLINIC_OR_DEPARTMENT_OTHER)
Admission: EM | Admit: 2012-01-25 | Discharge: 2012-01-25 | Disposition: A | Discharge status: Home / Self Care | Payer: Medicaid Other | Attending: Emergency Medicine | Admitting: Emergency Medicine

## 2012-01-25 DIAGNOSIS — R059 Cough, unspecified: Secondary | ICD-10-CM

## 2012-01-25 DIAGNOSIS — J069 Acute upper respiratory infection, unspecified: Secondary | ICD-10-CM

## 2012-01-25 DIAGNOSIS — F17200 Nicotine dependence, unspecified, uncomplicated: Secondary | ICD-10-CM | POA: Insufficient documentation

## 2012-01-25 DIAGNOSIS — K029 Dental caries, unspecified: Secondary | ICD-10-CM

## 2012-01-25 DIAGNOSIS — J029 Acute pharyngitis, unspecified: Secondary | ICD-10-CM

## 2012-01-25 DIAGNOSIS — K219 Gastro-esophageal reflux disease without esophagitis: Secondary | ICD-10-CM | POA: Insufficient documentation

## 2012-01-25 DIAGNOSIS — R05 Cough: Secondary | ICD-10-CM

## 2012-01-25 LAB — RAPID STREP SCREEN (MED CTR MEBANE ONLY): Streptococcus, Group A Screen (Direct): NEGATIVE

## 2012-01-25 MED ORDER — BENZONATATE 200 MG PO CAPS: 200.0000 mg | ORAL_CAPSULE | Freq: Three times a day (TID) | ORAL | Status: AC | PRN

## 2012-01-25 MED ORDER — BENZONATATE 100 MG PO CAPS
200.0000 mg | ORAL_CAPSULE | Freq: Once | ORAL | Status: AC
  Administered 2012-01-25: 200 mg via ORAL
  Filled 2012-01-25: qty 2

## 2012-01-25 NOTE — ED Notes (Signed)
Pt is at [facility] drug rehab, c/o sore throat , cough and fever

## 2012-01-25 NOTE — ED Provider Notes (Signed)
History     CSN: [id number]  Arrival date & time 01/25/12  1435   First MD Initiated Contact with Patient 01/25/12 1528      Chief Complaint  Patient presents with  . Sore Throat  . Fever    (Consider location/radiation/quality/duration/timing/severity/associated sxs/prior treatment) HPI Patient is a 46 year old female who presents today complaining of 2-3 days of sore throat as well as occasional cough. She reports fever as well but hasn't not had any measured fevers. She endorses some slight abdominal cramping this morning but denies abdominal pain at this time. She denies any urinary symptoms or vaginal discharge. Patient has had no nausea, vomiting, constipation, or diarrhea. Patient has tried throat lozenges without success. She has been taking ibuprofen and Tylenol as well. Patient is currently a resident at day [facility] for drug rehabilitation but has not used for over 65 days. Patient does note that there are some other residents with similar symptoms. Pain is worse with swallowing. Additionally patient has numerous dental caries and has lost followup with her previous dentist because of changes in insurance status. She has no difficulty swallowing or breathing. There are no other associated or modifying factors. Past Medical History  Diagnosis Date  . Abnormal Pap smear   . GERD (gastroesophageal reflux disease)     tums prn  . Headache     Past Surgical History  Procedure Date  . Dental surgery   . Cervical conization w/bx 12/13/2011    Procedure: CONIZATION CERVIX WITH BIOPSY;  Surgeon: Allie Bossier, MD;  Location: [facility];  Service: Gynecology;  Laterality: N/A;    Family History  Problem Relation Age of Onset  . Cancer Mother     unsure    History  Substance Use Topics  . Smoking status: Current Everyday Smoker -- 0.5 packs/day for 20 years    Types: Cigarettes  . Smokeless tobacco: Not on file  . Alcohol Use: Yes     history of -last used 6 months-[address]     OB History    Grav Para Term Preterm Abortions TAB SAB Ect Mult Living   1 1 1       1       Review of Systems  Constitutional: Negative.   HENT: Positive for congestion and sore throat.   Eyes: Negative.   Respiratory: Positive for cough.   Cardiovascular: Negative.   Gastrointestinal: Positive for abdominal pain.  Genitourinary: Negative.   Musculoskeletal: Negative.   Skin: Negative.   Neurological: Negative.   Hematological: Negative.   Psychiatric/Behavioral: Negative.   All other systems reviewed and are negative.    Allergies  Review of patient's allergies indicates no known allergies.  Home Medications   Current Outpatient Rx  Name Route Sig Dispense Refill  . DIPHENHYDRAMINE HCL 25 MG PO TABS Oral Take 25 mg by mouth every 6 (six) hours as needed. For allergies.    Marland Kitchen HYDROCORTISONE 1 % EX CREA Topical Apply 1 application topically 2 (two) times daily.    . IBUPROFEN 800 MG PO TABS Oral Take 800 mg by mouth every 8 (eight) hours as needed. For pain.    Marland Kitchen MELATONIN PO Oral Take 1-2 tablets by mouth daily as needed. For sleep.    Marland Kitchen OMEPRAZOLE 20 MG PO CPDR Oral Take 20 mg by mouth daily.    Marland Kitchen HALLS COUGH DROPS MT Mouth/Throat Use as directed 1 lozenge in the mouth or throat daily as needed. For sore throat.    Gerre Scull  1 % EX CREA Topical Apply 1 application topically 2 (two) times daily.     . TRIAMCINOLONE ACETONIDE 0.1 % EX CREA Topical Apply 1 application topically 2 (two) times daily.    . ALBUTEROL SULFATE HFA 108 (90 BASE) MCG/ACT IN AERS Inhalation Inhale 2 puffs into the lungs daily as needed. For wheezing    . ASPIRIN-ACETAMINOPHEN-CAFFEINE 250-250-65 MG PO TABS Oral Take 2 tablets by mouth daily as needed. For migraines not relieved by Ibuprofen      BP 108/65  Pulse 61  Temp 97.9 F (36.6 C) (Oral)  Resp 16  Ht 5\' 9"  (1.753 m)  Wt 200 lb (90.719 kg)  BMI 29.53 kg/m2  SpO2 98%  LMP 01/24/2012  Physical Exam  Nursing note and vitals  reviewed. GEN: Well-developed, well-nourished female in no distress HEENT: Atraumatic, normocephalic. Oropharynx with right-sided posterior erythema noted. No exudate seen. EYES: PERRLA BL, no scleral icterus. NECK: Trachea midline, no meningismus. Pain-free full range of motion. CV: regular rate and rhythm. No murmurs, rubs, or gallops PULM: No respiratory distress.  No crackles, wheezes, or rales. GI: soft, non-tender. No guarding, rebound, or tenderness. + bowel sounds  GU: deferred Neuro: cranial nerves grossly 2-12 intact, no abnormalities of strength or sensation, A and O x 3 MSK: Patient moves all 4 extremities symmetrically, no deformity, edema, or injury noted Skin: No rashes petechiae, purpura, or jaundice Psych: no abnormality of mood   ED Course  Procedures (including critical care time)   Labs Reviewed  RAPID STREP SCREEN   No results found.   1. Acute URI   2. Pharyngitis   3. Cough   4. Dental caries       MDM  Patient was seen by myself. Based on evaluation patient most likely had an upper respiratory tract infection. Strep swab was sent and was negative. Patient was very low risk for strep throat. She was given a dose of Tessalon Perles. Patient did not require additional treatment a day. She was told her symptoms may last 7-10 days. She was hemodynamically stable here. Patient did not require chest x-ray. We discussed this and she agreed with my assessment. Patient was discharged in good condition and advised to continue using over-the-counter medications for symptom control.        Cyndra Numbers, MD 01/25/12 1700

## 2012-01-25 NOTE — ED Notes (Signed)
Pt reports a sore throat and dental pain that started yesterday.

## 2012-02-11 ENCOUNTER — Ambulatory Visit (INDEPENDENT_AMBULATORY_CARE_PROVIDER_SITE_OTHER): Payer: Medicaid Other | Admitting: Obstetrics & Gynecology

## 2012-02-11 ENCOUNTER — Encounter: Payer: Self-pay | Admitting: Obstetrics & Gynecology

## 2012-02-11 VITALS — BP 98/66 | HR 84 | Temp 97.0°F | Ht 68.5 in | Wt 201.2 lb

## 2012-02-11 DIAGNOSIS — D069 Carcinoma in situ of cervix, unspecified: Secondary | ICD-10-CM

## 2012-02-11 DIAGNOSIS — Z09 Encounter for follow-up examination after completed treatment for conditions other than malignant neoplasm: Secondary | ICD-10-CM

## 2012-02-11 LAB — POCT URINALYSIS DIP (DEVICE)
Bilirubin Urine: NEGATIVE
Glucose, UA: 100 mg/dL — AB
Hgb urine dipstick: NEGATIVE
Ketones, ur: NEGATIVE mg/dL
Leukocytes, UA: NEGATIVE
Nitrite: NEGATIVE
Protein, ur: NEGATIVE mg/dL
Specific Gravity, Urine: 1.025 (ref 1.005–1.030)
Urobilinogen, UA: 0.2 mg/dL (ref 0.0–1.0)
pH: 5.5 (ref 5.0–8.0)

## 2012-02-11 NOTE — Patient Instructions (Signed)
Cervical Dysplasia Cervical dysplasia is a condition in which a woman has abnormal changes in the cells of her cervix. The cervix is the opening to the uterus (womb) between the vagina and the uterus. These changes are called cervical dysplasia and may be the first signs of cervical cancer. These cells can be taken from the cervix during a Pap test and then looked at under a microscope. With early detection, treatment, and close follow-up care, nearly all cervical dysplasia can be cured. If untreated, the mild to moderate stages of dysplasia often grow more severe.  RISK FACTORS  The following increase the risk for cervical dysplasia.  Having had a sexually transmitted disease, including:   Chlamydia.   Human papilloma virus (HPV).   Becoming sexually active before age 18.   Having had more than 1 sexual partner.   Not using protection, such as condoms, during sexual intercourse, especially with new sexual partners.   Having had cancer of the vagina or vulva.   Having a sexual partner whose previous partner had cancer of the cervix or cervical dysplasia.   Having a sexual partner who has or has had cancer of the penis.   Having a weakened immune system (HIV, organ transplant).   Being the daughter of a woman who took DES (diethylstilbestrol) during pregnancy.   A history of cervical cancer in a woman's sister or mother.   Smoking.   Having had an abnormal Pap test in the past.  SYMPTOMS  There are usually no symptoms. If there are symptoms, they may be vague such as:  Abnormal vaginal discharge.   Bleeding between periods or following intercourse.   Bleeding during menopause.   Pain on intercourse (dyspareunia).  DIAGNOSIS   The Pap test is the best way of detecting abnormalities of the cervix.   Biopsy (removing a piece of tissue to look at under the microscope) of the cervix when the Pap test is abnormal or when the Pap test is normal, but the cervix looks abnormal.    TREATMENT  Catching and treating the changes early with Pap tests can prevent cervical cancer.  Cryotherapy freezes the abnormal cells with a steel tip instrument.   A laser can be used to remove the abnormal cells.   Loop electrocautery excision procedure (LEEP). This procedure uses a heated electrical loop to remove a cone-like portion of the cervix, including the cervical canal.   For more serious cases of cervical dysplasia, the abnormal tissue may be removed surgically by:   A cone biopsy (by cold knife, laser or LEEP). A procedure in which a portion of the center of the cervix with the cervical canal is removed.   The uterus and cervix are removed (hysterectomy).  Your caregiver will advise you regarding the need and timing of Pap tests in your follow-up. Women who have been treated for dysplasia should be closely followed with pelvic exams and Pap tests. During the first year following treatment of cervical dysplasia, Pap tests should be done every 3 to 4 months. In the second year, the schedule is every 6 months, or as recommended by your caregiver. See your caregiver for new or worsening problems. HOME CARE INSTRUCTIONS   Follow the instructions and recommendations of your caregiver regarding medicines and follow-up appointments.   Only take over-the-counter or prescription medicines for pain or discomfort as directed by your caregiver.   Cramping and pelvic discomfort may follow cryotherapy. It is not abnormal to have watery discharge for several weeks after.     Laser, cone surgery, cryotherapy or LEEP can cause a bad smelling vaginal discharge. It may also cause vaginal bleeding for a couple weeks following the procedure. The discharge may be black from the paste used to control bleeding from the cone site. This is normal.   Do not use tampons, have sexual intercourse or douche until your caregiver says it is okay.  SEEK MEDICAL CARE IF:   You develop genital warts.   You  need a prescription for pain medicine following your treatment.  SEEK IMMEDIATE MEDICAL CARE IF:   Your bleeding is heavier than a normal menstrual period.   You develop bright red bleeding, especially if you have blood clots.   You have a fever.   You have increasing cramps or pain not relieved with medicine.   You are lightheaded, unusually weak, or have fainting spells.   You have abnormal vaginal discharge.   You develop abdominal pain.  PREVENTION   The surest way to prevent cervical dysplasia is to abstain from sexual intercourse.   Practice safe sex, use condoms and have only one sex partner who does not have other sex partners.   A Pap test is done to screen for cervical cancer.   The first Pap test should be done at age 21.   Between ages 21 and 29, Pap tests are repeated every 2 years.   Beginning at age 30, you are advised to have a Pap test every 3 years as long as your past 3 Pap tests have been normal.   Some women have medical problems that increase the chance of getting cervical cancer. Talk to your caregiver about these problems. It is especially important to talk to your caregiver if a new problem develops soon after your last Pap test. In these cases, your caregiver may recommend more frequent screening and Pap tests.   The above recommendations are the same for women who have or have not gotten the vaccine for HPV (Human Papillomavirus).   If you had a hysterectomy for a problem that was not a cancer or a condition that could lead to cancer, then you no longer need Pap tests. However, even if you no longer need a Pap test, a regular exam is a good idea to make sure no other problems are starting.    If you are between ages 65 and 70, and you have had normal Pap tests going back 10 years, you no longer need Pap tests. However, even if you no longer need a Pap test, a regular exam is a good idea to make sure no other problems are starting.    If you have  had past treatment for cervical cancer or a condition that could lead to cancer, you need Pap tests and screening for cancer for at least 20 years after your treatment.   If Pap tests have been discontinued, risk factors (such as a new sexual partner) need to be re-assessed to determine if screening should be resumed.   Some women may need screenings more often if they are at high risk for cervical cancer.   Your caregiver may do additional tests including:   Colposcopy. A procedure in which a special microscope magnifies the cells and allows the provider to closely examine the cervix, vagina, and vulva.   Biopsy. A small tissue sample is taken from the cervix, vagina or vulva. This is generally done in your caregivers office.   A cone biopsy (cold knife or laser). A large tissue sample is   taken from the cervix. This procedure is usually done in an operating room under a general anesthetic. The cone often removes all abnormal tissue and so may also complete the treatment.   LEEP, also removing a circular portion of the cervix and is done in a doctors office under a local anesthetic.   Now there is a vaccine, Gardasil, that was developed to prevent the HPV'S that can cause cancer of the cervix and genital warts. It is recommended for females ages 9 to 26. It should not be given to pregnant women until more is known about its effects on the fetus. Not all cancers of the cervix are caused by the HPV. Routine gynecology exams and Pap tests should continue as recommended by your caregiver.  Document Released: 06/07/2005 Document Revised: 05/27/2011 Document Reviewed: 05/29/2008 Exit[employer] Patient Information 2012 [employer]. 

## 2012-02-11 NOTE — Progress Notes (Signed)
Subjective:     Patient ID: Nancy [name], female   DOB: Dec 23, 1965, 46 y.o.   MRN: [id number]  HPIPatient's last menstrual period was 01/24/2012. 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 Has urology appt in 2 weeks, notes dark urine. Slight vaginal irritation, no discharge. LMP normal\   Review of Systems    As noted in HPI Objective:   Physical Exam Filed Vitals:   02/11/12 0852  BP: 98/66  Pulse: 84  Temp: 97 F (36.1 C)   NAD, pleasant Poor dentition Pelvic: EBUS, vaginal normal. Cervical scarring c/w conization. Os patent    Assessment:     Doing well, CIN III      Plan:     RTC 5 months for repeat pap smear.  Junie Avilla 02/11/2012  9:16 AM

## 2012-02-20 ENCOUNTER — Emergency Department (HOSPITAL_COMMUNITY)
Admission: EM | Admit: 2012-02-20 | Discharge: 2012-02-20 | Disposition: A | Discharge status: Home / Self Care | Payer: Medicaid Other | Source: Home / Self Care | Attending: Family Medicine | Admitting: Family Medicine

## 2012-02-20 ENCOUNTER — Encounter (HOSPITAL_COMMUNITY): Payer: Self-pay | Admitting: *Deleted

## 2012-02-20 DIAGNOSIS — J410 Simple chronic bronchitis: Secondary | ICD-10-CM

## 2012-02-20 DIAGNOSIS — K529 Noninfective gastroenteritis and colitis, unspecified: Secondary | ICD-10-CM

## 2012-02-20 DIAGNOSIS — K219 Gastro-esophageal reflux disease without esophagitis: Secondary | ICD-10-CM

## 2012-02-20 DIAGNOSIS — J40 Bronchitis, not specified as acute or chronic: Secondary | ICD-10-CM

## 2012-02-20 DIAGNOSIS — K5289 Other specified noninfective gastroenteritis and colitis: Secondary | ICD-10-CM

## 2012-02-20 HISTORY — DX: Bronchitis, not specified as acute or chronic: J40

## 2012-02-20 MED ORDER — GI COCKTAIL ~~LOC~~
ORAL | Status: AC
  Filled 2012-02-20: qty 30

## 2012-02-20 MED ORDER — SULFAMETHOXAZOLE-TRIMETHOPRIM 800-160 MG PO TABS: 1.0000 | ORAL_TABLET | Freq: Two times a day (BID) | ORAL | Status: AC

## 2012-02-20 MED ORDER — RANITIDINE HCL 150 MG PO TABS: 150.0000 mg | ORAL_TABLET | Freq: Two times a day (BID) | ORAL | Status: DC

## 2012-02-20 MED ORDER — GI COCKTAIL ~~LOC~~
30.0000 mL | Freq: Once | ORAL | Status: AC
  Administered 2012-02-20: 30 mL via ORAL

## 2012-02-20 NOTE — ED Notes (Signed)
Pt with c/o cough/congestion/fever/diarrhea/nausea/generalized body aches - burning in chest with coughing spell

## 2012-02-20 NOTE — ED Provider Notes (Addendum)
History     CSN: [id number]  Arrival date & time 02/20/12  0902   First MD Initiated Contact with Patient 02/20/12 [phone]      Chief Complaint  Patient presents with  . Fever  . Nausea  . Diarrhea  . Nasal Congestion  . Generalized Body Aches    (Consider location/radiation/quality/duration/timing/severity/associated sxs/prior treatment) Patient is a 46 y.o. female presenting with fever and diarrhea. The history is provided by the patient.  Fever Primary symptoms of the febrile illness include fever, cough, nausea, diarrhea and myalgias. Primary symptoms do not include wheezing, vomiting, dysuria, arthralgias or rash. The current episode started yesterday. This is a new problem.  Risk factors: smoker, in drug rehab program. Diarrhea The primary symptoms include fever, nausea, diarrhea and myalgias. Primary symptoms do not include vomiting, dysuria, arthralgias or rash.  The illness does not include chills.    Past Medical History  Diagnosis Date  . Abnormal Pap smear   . GERD (gastroesophageal reflux disease)     tums prn  . Headache   . Bronchitis     Past Surgical History  Procedure Date  . Dental surgery   . Cervical conization w/bx 12/13/2011    Procedure: CONIZATION CERVIX WITH BIOPSY;  Surgeon: Allie Bossier, MD;  Location: [facility];  Service: Gynecology;  Laterality: N/A;    Family History  Problem Relation Age of Onset  . Cancer Mother     unsure    History  Substance Use Topics  . Smoking status: Current Everyday Smoker -- 0.5 packs/day for 20 years    Types: Cigarettes  . Smokeless tobacco: Not on file  . Alcohol Use: Yes     history of -last used 6 months-[address]    OB History    Grav Para Term Preterm Abortions TAB SAB Ect Mult Living   1 1 1       1       Review of Systems  Constitutional: Positive for fever. Negative for chills and appetite change.  HENT: Negative.   Respiratory: Positive for cough. Negative for wheezing.   Cardiovascular:  Negative.   Gastrointestinal: Positive for nausea and diarrhea. Negative for vomiting.  Genitourinary: Negative for dysuria.  Musculoskeletal: Positive for myalgias. Negative for arthralgias.  Skin: Negative for rash.    Allergies  Review of patient's allergies indicates no known allergies.  Home Medications   Current Outpatient Rx  Name Route Sig Dispense Refill  . ALBUTEROL SULFATE HFA 108 (90 BASE) MCG/ACT IN AERS Inhalation Inhale 2 puffs into the lungs daily as needed. For wheezing    . ASPIRIN-ACETAMINOPHEN-CAFFEINE 250-250-65 MG PO TABS Oral Take 2 tablets by mouth daily as needed. For migraines not relieved by Ibuprofen    . TUSSIN COUGH DM PO Oral Take by mouth.    Marland Kitchen DIPHENHYDRAMINE HCL 25 MG PO TABS Oral Take 25 mg by mouth every 6 (six) hours as needed. For allergies.    Marland Kitchen HYDROCORTISONE 1 % EX CREA Topical Apply 1 application topically 2 (two) times daily.    . IBUPROFEN 800 MG PO TABS Oral Take 800 mg by mouth every 8 (eight) hours as needed. For pain.    Marland Kitchen MELATONIN PO Oral Take 1-2 tablets by mouth daily as needed. For sleep.    . SODIUM & POTASSIUM BICARBONATE PO TBEF Oral Take 1 tablet by mouth daily as needed.    Marland Kitchen OMEPRAZOLE 20 MG PO CPDR Oral Take 20 mg by mouth daily.    Marland Kitchen  RANITIDINE HCL 150 MG PO TABS Oral Take 1 tablet (150 mg total) by mouth 2 (two) times daily. 60 tablet 0  . SULFAMETHOXAZOLE-TRIMETHOPRIM 800-160 MG PO TABS Oral Take 1 tablet by mouth 2 (two) times daily. 20 tablet 0  . HALLS COUGH DROPS MT Mouth/Throat Use as directed 1 lozenge in the mouth or throat daily as needed. For sore throat.    . TOLNAFTATE 1 % EX CREA Topical Apply 1 application topically 2 (two) times daily.     . TRIAMCINOLONE ACETONIDE 0.1 % EX CREA Topical Apply 1 application topically 2 (two) times daily.      BP 105/67  Pulse 69  Temp 98.3 F (36.8 C) (Oral)  Resp 18  SpO2 99%  LMP 01/24/2012  Physical Exam  Nursing note and vitals reviewed. Constitutional: She is  oriented to person, place, and time. She appears well-developed and well-nourished.  HENT:  Head: Normocephalic.  Right Ear: External ear normal.  Left Ear: External ear normal.  Nose: Nose normal.  Mouth/Throat: Oropharynx is clear and moist.  Eyes: Pupils are equal, round, and reactive to light.  Neck: Normal range of motion. Neck supple.  Cardiovascular: Normal rate, regular rhythm, normal heart sounds and intact distal pulses.   Pulmonary/Chest: Effort normal. She has rhonchi.  Abdominal: Soft. Bowel sounds are normal. She exhibits no distension. There is no tenderness. There is no rebound and no guarding.  Lymphadenopathy:    She has no cervical adenopathy.  Neurological: She is alert and oriented to person, place, and time.  Skin: Skin is warm and dry.    ED Course  Procedures (including critical care time)  Labs Reviewed - No data to display No results found.   1. Bronchitis due to tobacco use   2. Gastroenteritis/enteritis   3. GERD (gastroesophageal reflux disease)       MDM          Linna Hoff, MD 02/20/12 0945  Linna Hoff, MD 02/20/12 [phone]

## 2012-02-24 NOTE — ED Notes (Signed)
Pt came in requesting [facility] form to be filled out.  Pt states she was given Bactrim which caused a false positive drug test.  Form specifically states it is only to be filled out if we provided a medication that was "either a narcotic or mind altering."  Discussed with Dr. Lorenz Coaster, pt informed we are unable to fill out paperwork because we only prescribed Bactrim and Zantac, neither of which are narcotic or mind altering.

## 2012-03-01 ENCOUNTER — Telehealth (HOSPITAL_COMMUNITY): Payer: Self-pay | Admitting: *Deleted

## 2012-03-01 NOTE — ED Notes (Signed)
Pt. called on VM asking for a form to be filled out for the drug treatment program she is in. She is on Bactrim. I called pt. and left a message to bring the paper in and to call when she got the message.  Pt. called back. She said she already had it taken care of.  She asked if Dr. Artis Flock would do a Rx. for the patch to quit smoking. I told her we don't prescribe that medication. She needs to get a PCP that can follow her for this and do refills. Vassie Moselle 03/01/2012

## 2012-03-23 ENCOUNTER — Ambulatory Visit: Payer: Medicaid Other | Attending: Urology | Admitting: Physical Therapy

## 2012-03-23 DIAGNOSIS — M2420 Disorder of ligament, unspecified site: Secondary | ICD-10-CM | POA: Insufficient documentation

## 2012-03-23 DIAGNOSIS — IMO0001 Reserved for inherently not codable concepts without codable children: Secondary | ICD-10-CM | POA: Insufficient documentation

## 2012-03-23 DIAGNOSIS — M629 Disorder of muscle, unspecified: Secondary | ICD-10-CM | POA: Insufficient documentation

## 2012-04-13 ENCOUNTER — Ambulatory Visit: Payer: Medicaid Other | Admitting: Physical Therapy

## 2012-04-27 ENCOUNTER — Ambulatory Visit: Payer: Medicaid Other | Admitting: Physical Therapy

## 2012-05-01 ENCOUNTER — Ambulatory Visit: Payer: Medicaid Other | Attending: Urology | Admitting: Physical Therapy

## 2012-05-01 DIAGNOSIS — M2420 Disorder of ligament, unspecified site: Secondary | ICD-10-CM | POA: Insufficient documentation

## 2012-05-01 DIAGNOSIS — IMO0001 Reserved for inherently not codable concepts without codable children: Secondary | ICD-10-CM | POA: Insufficient documentation

## 2012-05-01 DIAGNOSIS — M629 Disorder of muscle, unspecified: Secondary | ICD-10-CM | POA: Insufficient documentation

## 2012-05-04 ENCOUNTER — Other Ambulatory Visit: Payer: Self-pay | Admitting: Obstetrics and Gynecology

## 2012-05-04 DIAGNOSIS — N6009 Solitary cyst of unspecified breast: Secondary | ICD-10-CM

## 2012-05-16 ENCOUNTER — Ambulatory Visit: Payer: Medicaid Other | Admitting: Physical Therapy

## 2012-05-25 ENCOUNTER — Encounter: Payer: Medicaid Other | Admitting: Physical Therapy

## 2012-05-31 ENCOUNTER — Other Ambulatory Visit: Payer: Medicaid Other

## 2012-06-22 ENCOUNTER — Encounter: Payer: Medicaid Other | Admitting: Physical Therapy

## 2012-07-12 ENCOUNTER — Telehealth (HOSPITAL_COMMUNITY): Payer: Self-pay | Admitting: *Deleted

## 2012-07-12 NOTE — Telephone Encounter (Signed)
Telephoned patient at home # and left message to return call to [facility] 

## 2013-12-04 ENCOUNTER — Encounter (HOSPITAL_COMMUNITY): Payer: Self-pay | Admitting: Emergency Medicine

## 2013-12-04 ENCOUNTER — Emergency Department (HOSPITAL_COMMUNITY)
Admission: EM | Admit: 2013-12-04 | Discharge: 2013-12-05 | Disposition: A | Discharge status: Home / Self Care | Payer: Medicaid Other | Attending: Emergency Medicine | Admitting: Emergency Medicine

## 2013-12-04 DIAGNOSIS — F1210 Cannabis abuse, uncomplicated: Secondary | ICD-10-CM | POA: Insufficient documentation

## 2013-12-04 DIAGNOSIS — Z09 Encounter for follow-up examination after completed treatment for conditions other than malignant neoplasm: Secondary | ICD-10-CM

## 2013-12-04 DIAGNOSIS — Z8742 Personal history of other diseases of the female genital tract: Secondary | ICD-10-CM | POA: Insufficient documentation

## 2013-12-04 DIAGNOSIS — F17200 Nicotine dependence, unspecified, uncomplicated: Secondary | ICD-10-CM | POA: Insufficient documentation

## 2013-12-04 DIAGNOSIS — Z8709 Personal history of other diseases of the respiratory system: Secondary | ICD-10-CM | POA: Insufficient documentation

## 2013-12-04 DIAGNOSIS — F1910 Other psychoactive substance abuse, uncomplicated: Secondary | ICD-10-CM

## 2013-12-04 DIAGNOSIS — B9689 Other specified bacterial agents as the cause of diseases classified elsewhere: Secondary | ICD-10-CM

## 2013-12-04 DIAGNOSIS — F1410 Cocaine abuse, uncomplicated: Secondary | ICD-10-CM | POA: Insufficient documentation

## 2013-12-04 DIAGNOSIS — F1010 Alcohol abuse, uncomplicated: Secondary | ICD-10-CM | POA: Diagnosis present

## 2013-12-04 DIAGNOSIS — IMO0002 Reserved for concepts with insufficient information to code with codable children: Secondary | ICD-10-CM

## 2013-12-04 DIAGNOSIS — Z9289 Personal history of other medical treatment: Secondary | ICD-10-CM

## 2013-12-04 DIAGNOSIS — N760 Acute vaginitis: Secondary | ICD-10-CM

## 2013-12-04 DIAGNOSIS — Z8719 Personal history of other diseases of the digestive system: Secondary | ICD-10-CM | POA: Insufficient documentation

## 2013-12-04 DIAGNOSIS — Z792 Long term (current) use of antibiotics: Secondary | ICD-10-CM | POA: Insufficient documentation

## 2013-12-04 HISTORY — DX: Depression, unspecified: F32.A

## 2013-12-04 HISTORY — DX: Major depressive disorder, single episode, unspecified: F32.9

## 2013-12-04 LAB — I-STAT CHEM 8, ED
BUN: 9 mg/dL (ref 6–23)
Calcium, Ion: 1.14 mmol/L (ref 1.12–1.23)
Chloride: 103 mEq/L (ref 96–112)
Creatinine, Ser: 0.8 mg/dL (ref 0.50–1.10)
Glucose, Bld: 105 mg/dL — ABNORMAL HIGH (ref 70–99)
HCT: 52 % — ABNORMAL HIGH (ref 36.0–46.0)
Hemoglobin: 17.7 g/dL — ABNORMAL HIGH (ref 12.0–15.0)
Potassium: 3.8 mEq/L (ref 3.7–5.3)
Sodium: 139 mEq/L (ref 137–147)
TCO2: 21 mmol/L (ref 0–100)

## 2013-12-04 LAB — CBC WITH DIFFERENTIAL/PLATELET
Basophils Absolute: 0.1 10*3/uL (ref 0.0–0.1)
Basophils Relative: 1 % (ref 0–1)
Eosinophils Absolute: 0.1 10*3/uL (ref 0.0–0.7)
Eosinophils Relative: 1 % (ref 0–5)
HCT: 47.2 % — ABNORMAL HIGH (ref 36.0–46.0)
Hemoglobin: 16.5 g/dL — ABNORMAL HIGH (ref 12.0–15.0)
Lymphocytes Relative: 24 % (ref 12–46)
Lymphs Abs: 3.5 10*3/uL (ref 0.7–4.0)
MCH: 31.9 pg (ref 26.0–34.0)
MCHC: 35 g/dL (ref 30.0–36.0)
MCV: 91.3 fL (ref 78.0–100.0)
Monocytes Absolute: 0.8 10*3/uL (ref 0.1–1.0)
Monocytes Relative: 6 % (ref 3–12)
Neutro Abs: 10 10*3/uL — ABNORMAL HIGH (ref 1.7–7.7)
Neutrophils Relative %: 68 % (ref 43–77)
Platelets: 258 10*3/uL (ref 150–400)
RBC: 5.17 MIL/uL — ABNORMAL HIGH (ref 3.87–5.11)
RDW: 12.6 % (ref 11.5–15.5)
WBC: 14.5 10*3/uL — ABNORMAL HIGH (ref 4.0–10.5)

## 2013-12-04 LAB — RAPID URINE DRUG SCREEN, HOSP PERFORMED
Amphetamines: NOT DETECTED
Barbiturates: NOT DETECTED
Benzodiazepines: NOT DETECTED
Cocaine: POSITIVE — AB
Opiates: NOT DETECTED
Tetrahydrocannabinol: POSITIVE — AB

## 2013-12-04 LAB — ETHANOL: Alcohol, Ethyl (B): 38 mg/dL — ABNORMAL HIGH (ref 0–11)

## 2013-12-04 MED ORDER — LORAZEPAM 1 MG PO TABS: 0.0000 mg | ORAL_TABLET | Freq: Two times a day (BID) | ORAL | Status: DC

## 2013-12-04 MED ORDER — ONDANSETRON HCL 4 MG PO TABS
4.0000 mg | ORAL_TABLET | Freq: Three times a day (TID) | ORAL | Status: DC | PRN
  Administered 2013-12-05: 4 mg via ORAL
  Filled 2013-12-04: qty 1

## 2013-12-04 MED ORDER — NICOTINE 21 MG/24HR TD PT24
21.0000 mg | MEDICATED_PATCH | Freq: Every day | TRANSDERMAL | Status: DC
  Administered 2013-12-04 – 2013-12-05 (×2): 21 mg via TRANSDERMAL
  Filled 2013-12-04 (×2): qty 1

## 2013-12-04 MED ORDER — ZOLPIDEM TARTRATE 5 MG PO TABS
5.0000 mg | ORAL_TABLET | Freq: Every evening | ORAL | Status: DC | PRN
  Administered 2013-12-05: 5 mg via ORAL
  Filled 2013-12-04: qty 1

## 2013-12-04 MED ORDER — THIAMINE HCL 100 MG/ML IJ SOLN: 100.0000 mg | Freq: Every day | INTRAMUSCULAR | Status: DC

## 2013-12-04 MED ORDER — ALUM & MAG HYDROXIDE-SIMETH 200-200-20 MG/5ML PO SUSP
30.0000 mL | ORAL | Status: DC | PRN
Start: 2013-12-04 — End: 2013-12-05

## 2013-12-04 MED ORDER — LORAZEPAM 1 MG PO TABS
0.0000 mg | ORAL_TABLET | Freq: Four times a day (QID) | ORAL | Status: DC
  Administered 2013-12-04 (×2): 1 mg via ORAL
  Filled 2013-12-04: qty 2
  Filled 2013-12-04: qty 1

## 2013-12-04 MED ORDER — IBUPROFEN 200 MG PO TABS
600.0000 mg | ORAL_TABLET | Freq: Three times a day (TID) | ORAL | Status: DC | PRN
  Administered 2013-12-04: 600 mg via ORAL
  Filled 2013-12-04: qty 3

## 2013-12-04 MED ORDER — VITAMIN B-1 100 MG PO TABS
100.0000 mg | ORAL_TABLET | Freq: Every day | ORAL | Status: DC
  Administered 2013-12-04 – 2013-12-05 (×2): 100 mg via ORAL
  Filled 2013-12-04 (×2): qty 1

## 2013-12-04 NOTE — BH Assessment (Signed)
Tele Assessment Note   Nancy [name] is a 48 y.o. female who [facility] and opioid detox.  Pt denies SI/HI/AVH. Pt reports the following:  She drinks 2-1/5's, daily. Pt.'s last drink was 12/04/13, she drank 2 glasses of wine.  Pt admits she uses $100-$500 worth of crack, daily.  Pt.'s last use was 12/04/13. She says used $40 worth of crack.  She uses a "dime bag" of marijuana, daily.  Pt says she last used on 12/04/13 and "smoked 1/2 of a blunt".  Pt told this writer that she takes 2 vicodin pills at least 2x's a week, her last use was 12/04/14, she took 1 pill.  Pt denies any past hx of inpt detox admissions.  Pt denies any seizure or blackout activity and no legal issues.  Pt is c/o w/d sxs: tremors, hot/cold, anxiety and headache.   Axis I: Alcohol use disorder, Severe; Cocaine use disorder, Severe; Cannabis use disorder, Severe;Opioid use disorder, Mild  Axis II: Deferred Axis III:  Past Medical History  Diagnosis Date  . Abnormal Pap smear   . GERD (gastroesophageal reflux disease)     tums prn  . Headache(784.0)   . Bronchitis   . Depression    Axis IV: other psychosocial or environmental problems, problems related to social environment and problems with primary support group Axis V: 51-60 moderate symptoms  Past Medical History:  Past Medical History  Diagnosis Date  . Abnormal Pap smear   . GERD (gastroesophageal reflux disease)     tums prn  . Headache(784.0)   . Bronchitis   . Depression     Past Surgical History  Procedure Laterality Date  . Dental surgery    . Cervical conization w/bx  12/13/2011    Procedure: CONIZATION CERVIX WITH BIOPSY;  Surgeon: Allie BossierMyra C Dove, MD;  Location: [facility];  Service: Gynecology;  Laterality: N/A;    Family History:  Family History  Problem Relation Age of Onset  . Cancer Mother     unsure    Social History:  reports that she has been smoking Cigarettes.  She has a 10 pack-year smoking history. She has never used  smokeless tobacco. She reports that she drinks alcohol. She reports that she uses illicit drugs ("Crack" cocaine and Marijuana).  Additional Social History:  Alcohol / Drug Use Pain Medications: See MAR  Prescriptions: See MAR  Over the Counter: See MAR  History of alcohol / drug use?: Yes Longest period of sobriety (when/how long): None  Negative Consequences of Use: Work / School;Personal relationships;Financial Withdrawal Symptoms: Fever / Chills;Tremors;Other (Comment) (Anxiety, headache) Substance #1 Name of Substance 1: Alcohol  1 - Age of First Use: 15 YOF 1 - Amount (size/oz): 2-1/5's  1 - Frequency: Daily  1 - Duration: On-going  1 - Last Use / Amount: 12/04/13 Substance #2 Name of Substance 2: Crack  2 - Age of First Use: 27 YOF  2 - Amount (size/oz): $100-$500 2 - Frequency: Daily  2 - Duration: On-going  2 - Last Use / Amount: 12/04/13 Substance #3 Name of Substance 3: THC  3 - Age of First Use: 15 YOF  3 - Amount (size/oz): "Dime Bag"  3 - Frequency: Daily  3 - Duration: On-going  3 - Last Use / Amount: 12/04/13 Substance #4 Name of Substance 4: Pills(Vicodin)  4 - Age of First Use: 42 YOF  4 - Amount (size/oz): 2 Pills  4 - Frequency: 2x's wkly  4 - Duration:  On-going  4 - Last Use / Amount: 12/03/13  CIWA: CIWA-Ar BP: 125/88 mmHg Pulse Rate: 78 Nausea and Vomiting: no nausea and no vomiting Tactile Disturbances: none Tremor: not visible, but can be felt fingertip to fingertip Auditory Disturbances: not present Paroxysmal Sweats: no sweat visible Visual Disturbances: not present Anxiety: moderately anxious, or guarded, so anxiety is inferred Headache, Fullness in Head: none present Agitation: two Orientation and Clouding of Sensorium: oriented and can do serial additions CIWA-Ar Total: 7 COWS:    Allergies: No Known Allergies  Home Medications:  (Not in a hospital admission)  OB/GYN Status:  Patient's last menstrual period was  11/03/2013.  General Assessment Data Location of Assessment: [facility] Is this a Tele or Face-to-Face Assessment?: Face-to-Face Is this an Initial Assessment or a Re-assessment for this encounter?: Initial Assessment Living Arrangements: Alone Can pt return to current living arrangement?: Yes Admission Status: Voluntary Is patient capable of signing voluntary admission?: Yes Transfer from: Acute Hospital Referral Source: MD  Medical Screening Exam [facility] Walk-in ONLY) Medical Exam completed: No Reason for MSE not completed: Other: (None )  [facility] Living Arrangements: Alone Name of Psychiatrist: None  Name of Therapist: None   Education Status Is patient currently in school?: No Current Grade: None  Highest grade of school patient has completed: None  Name of school: None  Contact person: None   Risk to self Suicidal Ideation: No Suicidal Intent: No Is patient at risk for suicide?: No Suicidal Plan?: No Access to Means: No What has been your use of drugs/alcohol within the last 12 months?: Ausing: alcohol, crack, thc, and pills(vicodn)  Previous Attempts/Gestures: No How many times?: 0 Other Self Harm Risks: None  Triggers for Past Attempts: None known Intentional Self Injurious Behavior: None Family Suicide History: No Recent stressful life event(s): Other (Comment) (SA ) Persecutory voices/beliefs?: No Depression: Yes Depression Symptoms: Loss of interest in usual pleasures Substance abuse history and/or treatment for substance abuse?: Yes Suicide prevention information given to non-admitted patients: Not applicable  Risk to Others Homicidal Ideation: No Thoughts of Harm to Others: No Current Homicidal Intent: No Current Homicidal Plan: No Access to Homicidal Means: No Identified Victim: None  History of harm to others?: No Assessment of Violence: None Noted Violent Behavior Description: None  Does patient have access to weapons?: No Criminal  Charges Pending?: No Does patient have a court date: No  Psychosis Hallucinations: None noted Delusions: None noted  Mental Status Report Appear/Hygiene: Disheveled;In scrubs Eye Contact: Good Motor Activity: Unremarkable Speech: Logical/coherent Level of Consciousness: Alert Mood: Depressed Affect: Appropriate to circumstance;Depressed Anxiety Level: None Thought Processes: Coherent;Relevant Judgement: Unimpaired Orientation: Person;Place;Time;Situation Obsessive Compulsive Thoughts/Behaviors: None  Cognitive Functioning Concentration: Decreased Memory: Recent Intact;Remote Intact IQ: Average Insight: Fair Impulse Control: Fair Appetite: Good Weight Loss: 0 Weight Gain: 0 Sleep: No Change Total Hours of Sleep: 5 Vegetative Symptoms: None  ADLScreening [facility] Assessment Services) Patient's cognitive ability adequate to safely complete daily activities?: Yes Patient able to express need for assistance with ADLs?: Yes Independently performs ADLs?: Yes (appropriate for developmental age)  Prior Inpatient Therapy Prior Inpatient Therapy: No Prior Therapy Dates: None  Prior Therapy Facilty/Provider(s): None  Reason for Treatment: None   Prior Outpatient Therapy Prior Outpatient Therapy: No Prior Therapy Facilty/Provider(s): None  Reason for Treatment: None   ADL Screening (condition at time of admission) Patient's cognitive ability adequate to safely complete daily activities?: Yes Is the patient deaf or have difficulty hearing?: No Does the patient have  difficulty seeing, even when wearing glasses/contacts?: No Does the patient have difficulty concentrating, remembering, or making decisions?: Yes Patient able to express need for assistance with ADLs?: Yes Does the patient have difficulty dressing or bathing?: No Independently performs ADLs?: Yes (appropriate for developmental age) Does the patient have difficulty walking or climbing stairs?: No Weakness of Legs:  None Weakness of Arms/Hands: None  Home Assistive Devices/Equipment Home Assistive Devices/Equipment: None  Therapy Consults (therapy consults require a physician order) PT Evaluation Needed: No OT Evalulation Needed: No SLP Evaluation Needed: No Abuse/Neglect Assessment (Assessment to be complete while patient is alone) Physical Abuse: Denies Verbal Abuse: Denies Sexual Abuse: Denies Exploitation of patient/patient's resources: Denies Self-Neglect: Denies Values / Beliefs Cultural Requests During Hospitalization: None Spiritual Requests During Hospitalization: None Consults Spiritual Care Consult Needed: No Social Work Consult Needed: No Merchant navy officer (For Healthcare) Advance Directive: Patient does not have advance directive;Patient would not like information Pre-existing out of facility DNR order (yellow form or pink MOST form): No Nutrition Screen- [facility] Patient's home diet: Regular  Additional Information 1:1 In Past 12 Months?: No CIRT Risk: No Elopement Risk: No Does patient have medical clearance?: Yes     Disposition:  Disposition Initial Assessment Completed for this Encounter: Yes Disposition of Patient: Referred to;Inpatient treatment program ([facility] will need to refer to [facility] ) Type of inpatient treatment program: Adult Patient referred to: [facility]  Beatrix Shipper C 12/05/2013 12:30 AM

## 2013-12-04 NOTE — ED Notes (Signed)
[facility] Expected date:  Expected time:  Means of arrival:  Comments: Hold for [employer]

## 2013-12-04 NOTE — Progress Notes (Signed)
[facility] provided pt with a [facility] Orange Card application to help patient establish primary care.  °

## 2013-12-04 NOTE — ED Notes (Addendum)
Pt requesting detox from ETOh and cocaine. Pt's last use of both was this morning. Pt states that she drinks about 1/5-1 1/2 1/5th's plus beer/day.

## 2013-12-04 NOTE — ED Notes (Signed)
Patient denies feelings of depression. Rates anxiety 7/10. Denies Si, HI, AVH at present. C/o headache 5/10.  Encouragement offered. Given snack, Motrin.  Patient safety maintained, Q 15 checks in place.

## 2013-12-04 NOTE — ED Notes (Signed)
Patient has one bag of belongings. Patient has two bracelets one ring and a cell phone and charger. Belongings went with patient to [facility].

## 2013-12-04 NOTE — ED Notes (Signed)
Pt AAOx3.  Pt mildly anxious and cooperative.  Pt denies SI/HI/AV/VH.  Pt present for etoh and cocaine detox.  Pt last usage this AM.  Will continue to monitor.

## 2013-12-04 NOTE — Progress Notes (Signed)
  CARE MANAGEMENT ED NOTE 12/04/2013  Patient:  Nancy [name],Nancy [name]   Account Number:  [id number]  Date Initiated:  12/04/2013  Documentation initiated by:  Edd ArbourGIBBS,KIMBERLY  Subjective/Objective Assessment:   48 yr [name] requesting detox from ETOh and cocaine. Pt's last use of both was this morning. Pt states that she drinks about 1/5-1 1/2 1/5th's plus beer/day     Subjective/Objective Assessment Detail:     Action/Plan:   Cm reviewed EPIC, CM went to see pt Called her name x 3 Pt did not open her eyes but moved in bed. CM provided pt a list of [place] med[place] providers Left on her beside table   Action/Plan Detail:   Anticipated DC Date:       Status Recommendation to Physician:   Result of Recommendation:    Other ED Services  Consult Working Plan    DC Planning Services  Other  PCP issues    Choice offered to / List presented to:            Status of service:  Completed, signed off  ED Comments:   ED Comments Detail:

## 2013-12-04 NOTE — ED Provider Notes (Addendum)
CSN: [id number]     Arrival date & time 12/04/13  1029 History   First MD Initiated Contact with Patient 12/04/13 1058     Chief Complaint  Patient presents with  . detox      (Consider location/radiation/quality/duration/timing/severity/associated sxs/prior Treatment) HPI Comments: Pt here requesting detox from vicodin, cocaine and alcohol which she has been using heavily for at least the last 6 months.  Long hx of polysubstance abuse.  Pt's last use was PTA.  She denies any SI/HI or depression.  No chronic medical conditions and takes no meds.  LMP 3 weeks ago.  No other complaints.  The history is provided by the patient.    Past Medical History  Diagnosis Date  . Abnormal Pap smear   . GERD (gastroesophageal reflux disease)     tums prn  . Headache(784.0)   . Bronchitis    Past Surgical History  Procedure Laterality Date  . Dental surgery    . Cervical conization w/bx  12/13/2011    Procedure: CONIZATION CERVIX WITH BIOPSY;  Surgeon: Allie BossierMyra C Dove, MD;  Location: [facility];  Service: Gynecology;  Laterality: N/A;   Family History  Problem Relation Age of Onset  . Cancer Mother     unsure   History  Substance Use Topics  . Smoking status: Current Every Day Smoker -- 0.50 packs/day for 20 years    Types: Cigarettes  . Smokeless tobacco: Never Used  . Alcohol Use: Yes   OB History   Grav Para Term Preterm Abortions TAB SAB Ect Mult Living   1 1 1       1      Review of Systems  All other systems reviewed and are negative.     Allergies  Review of patient's allergies indicates no known allergies.  Home Medications   Prior to Admission medications   Medication Sig Start Date End Date Taking? Authorizing Provider  albuterol (PROVENTIL HFA;VENTOLIN HFA) 108 (90 BASE) MCG/ACT inhaler Inhale 2 puffs into the lungs every 6 (six) hours as needed for wheezing or shortness of breath.   Yes Historical Provider, MD  sulfamethoxazole-trimethoprim (BACTRIM DS) 800-160 MG per  tablet Take 1 tablet by mouth 2 (two) times daily.   Yes Historical Provider, MD   BP 114/62  Pulse 88  Temp(Src) 98 F (36.7 C) (Oral)  Resp 16  SpO2 94%  LMP 11/03/2013 Physical Exam  Nursing note and vitals reviewed. Constitutional: She is oriented to person, place, and time. She appears well-developed and well-nourished. No distress.  Smells of etoh and intoxicated on exam  HENT:  Head: Normocephalic and atraumatic.  Mouth/Throat: Oropharynx is clear and moist.  Eyes: Conjunctivae and EOM are normal. Pupils are equal, round, and reactive to light.  Neck: Normal range of motion. Neck supple.  Cardiovascular: Normal rate, regular rhythm and intact distal pulses.   No murmur heard. Pulmonary/Chest: Effort normal and breath sounds normal. No respiratory distress. She has no wheezes. She has no rales.  Abdominal: Soft. She exhibits no distension. There is no tenderness. There is no rebound and no guarding.  Musculoskeletal: Normal range of motion. She exhibits no edema and no tenderness.  Neurological: She is alert and oriented to person, place, and time.  Skin: Skin is warm and dry. No rash noted. No erythema.  Psychiatric: She has a normal mood and affect. Her behavior is normal. She expresses no homicidal and no suicidal ideation.    ED Course  Procedures (including critical care time) Labs  Review Labs Reviewed  CBC WITH DIFFERENTIAL - Abnormal; Notable for the following:    WBC 14.5 (*)    RBC 5.17 (*)    Hemoglobin 16.5 (*)    HCT 47.2 (*)    Neutro Abs 10.0 (*)    All other components within normal limits  ETHANOL - Abnormal; Notable for the following:    Alcohol, Ethyl (B) 38 (*)    All other components within normal limits  I-STAT CHEM 8, ED - Abnormal; Notable for the following:    Glucose, Bld 105 (*)    Hemoglobin 17.7 (*)    HCT 52.0 (*)    All other components within normal limits  URINE RAPID DRUG SCREEN (HOSP PERFORMED)    Imaging Review No results  found.   EKG Interpretation None      MDM   Final diagnoses:  Polysubstance abuse   Patient is here requesting detox from drugs and alcohol. She uses Vicodin, cocaine and alcohol for several years. The last use was prior to arrival. [facility] but now she needs to come here for medical clearance.  She denies any medical history or her chronic medications. She has no other complaints. Patient is obviously intoxicated on exam. Medical clearance labs sent will allow patient to sober up prior to evaluation to ensure that she truly does want detox  12:08 PM Pt is medically clear.  Will consult [facility].  Gwyneth SproutWhitney Jianna Drabik, MD 12/04/13 [id number]  Gwyneth SproutWhitney Vincent Streater, MD 12/04/13 1209

## 2013-12-05 ENCOUNTER — Encounter (HOSPITAL_COMMUNITY): Payer: Self-pay | Admitting: Registered Nurse

## 2013-12-05 DIAGNOSIS — F1010 Alcohol abuse, uncomplicated: Secondary | ICD-10-CM | POA: Diagnosis present

## 2013-12-05 DIAGNOSIS — Z09 Encounter for follow-up examination after completed treatment for conditions other than malignant neoplasm: Secondary | ICD-10-CM

## 2013-12-05 DIAGNOSIS — Z9289 Personal history of other medical treatment: Secondary | ICD-10-CM

## 2013-12-05 LAB — HEPATIC FUNCTION PANEL
ALT: 16 U/L (ref 0–35)
AST: 15 U/L (ref 0–37)
Albumin: 3.2 g/dL — ABNORMAL LOW (ref 3.5–5.2)
Alkaline Phosphatase: 53 U/L (ref 39–117)
Bilirubin, Direct: <0.2 mg/dL (ref 0.0–0.3)
Total Bilirubin: 0.4 mg/dL (ref 0.3–1.2)
Total Protein: 6.7 g/dL (ref 6.0–8.3)

## 2013-12-05 NOTE — Consult Note (Signed)
Face to face evaluation and I agree with this note 

## 2013-12-05 NOTE — Discharge Instructions (Signed)
Alcohol Intoxication °Alcohol intoxication occurs when the amount of alcohol that a person has consumed impairs his or her ability to mentally and physically function. Alcohol directly impairs the normal chemical activity of the brain. Drinking large amounts of alcohol can lead to changes in mental function and behavior, and it can cause many physical effects that can be harmful.  °Alcohol intoxication can range in severity from mild to very severe. Various factors can affect the level of intoxication that occurs, such as the person's age, gender, weight, frequency of alcohol consumption, and the presence of other medical conditions (such as diabetes, seizures, or heart conditions). Dangerous levels of alcohol intoxication may occur when people drink large amounts of alcohol in a short period (binge drinking). Alcohol can also be especially dangerous when combined with certain prescription medicines or "recreational" drugs. °SIGNS AND SYMPTOMS °Some common signs and symptoms of mild alcohol intoxication include: °· Loss of coordination. °· Changes in mood and behavior. °· Impaired judgment. °· Slurred speech. °As alcohol intoxication progresses to more severe levels, other signs and symptoms will appear. These may include: °· Vomiting. °· Confusion and impaired memory. °· Slowed breathing. °· Seizures. °· Loss of consciousness. °DIAGNOSIS  °Your health care provider will take a medical history and perform a physical exam. You will be asked about the amount and type of alcohol you have consumed. Blood tests will be done to measure the concentration of alcohol in your blood. In many places, your blood alcohol level must be lower than 80 mg/dL (0.08%) to legally drive. However, many dangerous effects of alcohol can occur at much lower levels.  °TREATMENT  °People with alcohol intoxication often do not require treatment. Most of the effects of alcohol intoxication are temporary, and they go away as the alcohol naturally  leaves the body. Your health care provider will monitor your condition until you are stable enough to go home. Fluids are sometimes given through an IV access tube to help prevent dehydration.  °HOME CARE INSTRUCTIONS °· Do not drive after drinking alcohol. °· Stay hydrated. Drink enough water and fluids to keep your urine clear or pale yellow. Avoid caffeine.   °· Only take over-the-counter or prescription medicines as directed by your health care provider.   °SEEK MEDICAL CARE IF:  °· You have persistent vomiting.   °· You do not feel better after a few days. °· You have frequent alcohol intoxication. Your health care provider can help determine if you should see a substance use treatment counselor. °SEEK IMMEDIATE MEDICAL CARE IF:  °· You become shaky or tremble when you try to stop drinking.   °· You shake uncontrollably (seizure).   °· You throw up (vomit) blood. This may be bright red or may look like black coffee grounds.   °· You have blood in your stool. This may be bright red or may appear as a black, tarry, bad smelling stool.   °· You become lightheaded or faint.   °MAKE SURE YOU:  °· Understand these instructions. °· Will watch your condition. °· Will get help right away if you are not doing well or get worse. °Document Released: 03/17/2005 Document Revised: 02/07/2013 Document Reviewed: 11/10/2012 °ExitCare® Patient Information ©2015 [employer]. This information is not intended to replace advice given to you by your health care provider. Make sure you discuss any questions you have with your health care provider. ° °Alcohol Withdrawal °Alcohol withdrawal happens when you normally drink alcohol a lot and suddenly stop drinking. Alcohol withdrawal symptoms can be mild to very bad. Mild   withdrawal symptoms can include feeling sick to your stomach (nauseous), headache, or feeling irritable. Bad withdrawal symptoms can include shakiness, being very nervous (anxious), and not thinking clearly.  °HOME  CARE °· Join an alcohol support group. °· Stay away from people or situations that make you want to drink. °· Eat a healthy diet. Eat a lot of fresh fruits, vegetables, and lean meats. °GET HELP RIGHT AWAY IF:  °· You become confused. You start to see and hear things that are not really there. °· You feel your heart beating very fast. °· You throw up (vomit) blood or cannot stop throwing up. This may be bright red or look like black coffee grounds. °· You have blood in your poop (stool). This may be bright red, maroon colored, or black and tarry. °· You are lightheaded or pass out (faint). °· You develop a fever. °MAKE SURE YOU:  °· Understand these instructions. °· Will watch your condition. °· Will get help right away if you are not doing well or get worse. °Document Released: 11/24/2007 Document Revised: 08/30/2011 Document Reviewed: 11/24/2007 °ExitCare® Patient Information ©2015 [employer]. This information is not intended to replace advice given to you by your health care provider. Make sure you discuss any questions you have with your health care provider. ° °

## 2013-12-05 NOTE — Consult Note (Signed)
[facility] Face-to-Face Psychiatry Consult for Discharge  Reason for Consult:  Alcohol abuse and requesting detox   Referring Physician:  EDP STACHIA [name] is an 48 y.o. female. Total Time spent with patient: 1 hour  Assessment: AXIS I:  Alcohol Abuse AXIS II:  Deferred AXIS III:   Past Medical History  Diagnosis Date  . Abnormal Pap smear   . GERD (gastroesophageal reflux disease)     tums prn  . Headache(784.0)   . Bronchitis   . Depression    AXIS IV:  other psychosocial or environmental problems AXIS V:  51-60 moderate symptoms  Plan:  Inpatient detox  Subjective:   Nancy [name] is a 48 y.o. female patient.  HPI:  Patient states "I have a bed at [facility]; but they said that I would have to have 3-4 days detox before I came in."  Patient states that she drinks 2 fifths of wine daily and last drink was yesterday.  Patient states that she is feeling jittery right now.  Patient denies suicidal/homicidal ideation, psychosis, and paranoia.  HPI Elements:   Location:  Alcohol abuse. Quality:  drinks daily. Severity:  drinks daily. Timing:  years. Review of Systems  Constitutional: Positive for diaphoresis.  Gastrointestinal: Negative for nausea, vomiting, abdominal pain, diarrhea and constipation.  Musculoskeletal: Negative.   Neurological: Positive for tremors. Negative for seizures and headaches.  Psychiatric/Behavioral: Positive for substance abuse. Negative for depression, suicidal ideas, hallucinations and memory loss. The patient is nervous/anxious. The patient does not have insomnia.     Family History  Problem Relation Age of Onset  . Cancer Mother     unsure    Past Psychiatric History: Past Medical History  Diagnosis Date  . Abnormal Pap smear   . GERD (gastroesophageal reflux disease)     tums prn  . Headache(784.0)   . Bronchitis   . Depression     reports that she has been smoking Cigarettes.  She has a 10 pack-year smoking history. She has never used  smokeless tobacco. She reports that she drinks alcohol. She reports that she uses illicit drugs ("Crack" cocaine and Marijuana). Family History  Problem Relation Age of Onset  . Cancer Mother     unsure   Family History Substance Abuse: No Family Supports: No (None reported ) Living Arrangements: Alone Can pt return to current living arrangement?: Yes Abuse/Neglect [facility]) Physical Abuse: Denies Verbal Abuse: Denies Sexual Abuse: Denies Allergies:  No Known Allergies  ACT Assessment Complete:  Yes:    Educational Status    Risk to Self: Risk to self Suicidal Ideation: No Suicidal Intent: No Is patient at risk for suicide?: No Suicidal Plan?: No Access to Means: No What has been your use of drugs/alcohol within the last 12 months?: Ausing: alcohol, crack, thc, and pills(vicodn)  Previous Attempts/Gestures: No How many times?: 0 Other Self Harm Risks: None  Triggers for Past Attempts: None known Intentional Self Injurious Behavior: None Family Suicide History: No Recent stressful life event(s): Other (Comment) ([facility] ) Persecutory voices/beliefs?: No Depression: Yes Depression Symptoms: Loss of interest in usual pleasures Substance abuse history and/or treatment for substance abuse?: Yes Suicide prevention information given to non-admitted patients: Not applicable  Risk to Others: Risk to Others Homicidal Ideation: No Thoughts of Harm to Others: No Current Homicidal Intent: No Current Homicidal Plan: No Access to Homicidal Means: No Identified Victim: None  History of harm to others?: No Assessment of Violence: None Noted Violent Behavior Description: None  Does patient  have access to weapons?: No Criminal Charges Pending?: No Does patient have a court date: No  Abuse: Abuse/Neglect Assessment (Assessment to be complete while patient is alone) Physical Abuse: Denies Verbal Abuse: Denies Sexual Abuse: Denies Exploitation of patient/patient's resources:  Denies Self-Neglect: Denies  Prior Inpatient Therapy: Prior Inpatient Therapy Prior Inpatient Therapy: No Prior Therapy Dates: None  Prior Therapy Facilty/Provider(s): None  Reason for Treatment: None   Prior Outpatient Therapy: Prior Outpatient Therapy Prior Outpatient Therapy: No Prior Therapy Facilty/Provider(s): None  Reason for Treatment: None   Additional Information: Additional Information 1:1 In Past 12 Months?: No CIRT Risk: No Elopement Risk: No Does patient have medical clearance?: Yes    Objective: Blood pressure 124/82, pulse 75, temperature 97.7 F (36.5 C), temperature source Oral, resp. rate 18, last menstrual period 11/03/2013, SpO2 98.00%.There is no weight on file to calculate BMI. Results for orders placed during the hospital encounter of 12/04/13 (from the past 72 hour(s))  CBC WITH DIFFERENTIAL     Status: Abnormal   Collection Time    12/04/13 11:22 AM      Result Value Ref Range   WBC 14.5 (*) 4.0 - 10.5 K/uL   RBC 5.17 (*) 3.87 - 5.11 MIL/uL   Hemoglobin 16.5 (*) 12.0 - 15.0 g/dL   HCT 16.147.2 (*) 09.636.0 - 04.546.0 %   MCV 91.3  78.0 - 100.0 fL   MCH 31.9  26.0 - 34.0 pg   MCHC 35.0  30.0 - 36.0 g/dL   RDW 40.912.6  81.111.5 - 91.415.5 %   Platelets 258  150 - 400 K/uL   Neutrophils Relative % 68  43 - 77 %   Neutro Abs 10.0 (*) 1.7 - 7.7 K/uL   Lymphocytes Relative 24  12 - 46 %   Lymphs Abs 3.5  0.7 - 4.0 K/uL   Monocytes Relative 6  3 - 12 %   Monocytes Absolute 0.8  0.1 - 1.0 K/uL   Eosinophils Relative 1  0 - 5 %   Eosinophils Absolute 0.1  0.0 - 0.7 K/uL   Basophils Relative 1  0 - 1 %   Basophils Absolute 0.1  0.0 - 0.1 K/uL  ETHANOL     Status: Abnormal   Collection Time    12/04/13 11:22 AM      Result Value Ref Range   Alcohol, Ethyl (B) 38 (*) 0 - 11 mg/dL   Comment:            LOWEST DETECTABLE LIMIT FOR     SERUM ALCOHOL IS 11 mg/dL     FOR MEDICAL PURPOSES ONLY  I-STAT CHEM 8, ED     Status: Abnormal   Collection Time    12/04/13 11:32 AM       Result Value Ref Range   Sodium 139  137 - 147 mEq/L   Potassium 3.8  3.7 - 5.3 mEq/L   Chloride 103  96 - 112 mEq/L   BUN 9  6 - 23 mg/dL   Creatinine, Ser 7.820.80  0.50 - 1.10 mg/dL   Glucose, Bld 956105 (*) 70 - 99 mg/dL   Calcium, Ion 2.131.14  0.861.12 - 1.23 mmol/L   TCO2 21  0 - 100 mmol/L   Hemoglobin 17.7 (*) 12.0 - 15.0 g/dL   HCT 57.852.0 (*) 46.936.0 - 62.946.0 %  URINE RAPID DRUG SCREEN (HOSP PERFORMED)     Status: Abnormal   Collection Time    12/04/13 11:44 AM  Result Value Ref Range   Opiates NONE DETECTED  NONE DETECTED   Cocaine POSITIVE (*) NONE DETECTED   Benzodiazepines NONE DETECTED  NONE DETECTED   Amphetamines NONE DETECTED  NONE DETECTED   Tetrahydrocannabinol POSITIVE (*) NONE DETECTED   Barbiturates NONE DETECTED  NONE DETECTED   Comment:            DRUG SCREEN FOR MEDICAL PURPOSES     ONLY.  IF CONFIRMATION IS NEEDED     FOR ANY PURPOSE, NOTIFY LAB     WITHIN 5 DAYS.                LOWEST DETECTABLE LIMITS     FOR URINE DRUG SCREEN     Drug Class       Cutoff (ng/mL)     Amphetamine      1000     Barbiturate      200     Benzodiazepine   200     Tricyclics       300     Opiates          300     Cocaine          300     THC              50  HEPATIC FUNCTION PANEL     Status: Abnormal   Collection Time    12/05/13  5:27 AM      Result Value Ref Range   Total Protein 6.7  6.0 - 8.3 g/dL   Albumin 3.2 (*) 3.5 - 5.2 g/dL   AST 15  0 - 37 U/L   ALT 16  0 - 35 U/L   Alkaline Phosphatase 53  39 - 117 U/L   Total Bilirubin 0.4  0.3 - 1.2 mg/dL   Bilirubin, Direct <1.6  0.0 - 0.3 mg/dL   Indirect Bilirubin NOT CALCULATED  0.3 - 0.9 mg/dL   Labs are reviewed see above.  Medications reviewed and no changes made.  Current Facility-Administered Medications  Medication Dose Route Frequency Provider Last Rate Last Dose  . alum & mag hydroxide-simeth (MAALOX/MYLANTA) 200-200-20 MG/5ML suspension 30 mL  30 mL Oral PRN Gwyneth Sprout, MD      . ibuprofen (ADVIL,MOTRIN)  tablet 600 mg  600 mg Oral Q8H PRN Gwyneth Sprout, MD   600 mg at 12/04/13 1956  . LORazepam (ATIVAN) tablet 0-4 mg  0-4 mg Oral 4 times per day Gwyneth Sprout, MD   1 mg at 12/04/13 2150   Followed by  . [START ON 12/06/2013] LORazepam (ATIVAN) tablet 0-4 mg  0-4 mg Oral Q12H Gwyneth Sprout, MD      . nicotine (NICODERM CQ - dosed in mg/24 hours) patch 21 mg  21 mg Transdermal Daily Gwyneth Sprout, MD   21 mg at 12/05/13 0931  . ondansetron (ZOFRAN) tablet 4 mg  4 mg Oral Q8H PRN Gwyneth Sprout, MD   4 mg at 12/05/13 0334  . thiamine (VITAMIN B-1) tablet 100 mg  100 mg Oral Daily Gwyneth Sprout, MD   100 mg at 12/05/13 1096   Or  . thiamine (B-1) injection 100 mg  100 mg Intravenous Daily Gwyneth Sprout, MD      . zolpidem (AMBIEN) tablet 5 mg  5 mg Oral QHS PRN Gwyneth Sprout, MD   5 mg at 12/05/13 0030   Current Outpatient Prescriptions  Medication Sig Dispense Refill  . albuterol (PROVENTIL HFA;VENTOLIN HFA) 108 (90 BASE) MCG/ACT  inhaler Inhale 2 puffs into the lungs every 6 (six) hours as needed for wheezing or shortness of breath.      . sulfamethoxazole-trimethoprim (BACTRIM DS) 800-160 MG per tablet Take 1 tablet by mouth 2 (two) times daily.        Psychiatric Specialty Exam:     Blood pressure 124/82, pulse 75, temperature 97.7 F (36.5 C), temperature source Oral, resp. rate 18, last menstrual period 11/03/2013, SpO2 98.00%.There is no weight on file to calculate BMI.  General Appearance: Casual  Eye Contact::  Good  Speech:  Clear and Coherent and Normal Rate  Volume:  Normal  Mood:  Anxious  Affect:  Congruent  Thought Process:  Circumstantial, Goal Directed and Logical  Orientation:  Full (Time, Place, and Person)  Thought Content:  "I just want to get some help"  Suicidal Thoughts:  No  Homicidal Thoughts:  No  Memory:  Immediate;   Good Recent;   Good Remote;   Good  Judgement:  Intact  Insight:  Present  Psychomotor Activity:  Restlessness and  Tremor  Concentration:  Fair  Recall:  Good  Fund of Knowledge:Good  Language: Good  Akathisia:  No  Handed:  Right  AIMS (if indicated):     Assets:  Communication Skills Desire for Improvement Housing Social Support  Sleep:      Musculoskeletal: Strength & Muscle Tone: within normal limits Gait & Station: normal Patient leans: N/A  Treatment Plan Summary: Daily contact with patient to assess and evaluate symptoms and progress in treatment Medication management  Inpatient detox recommended.  Patient has been accepted by [facility].  Will continue to monitor until transferred to [facility].  Discharge Assessment     Demographic Factors:  Caucasian and Female  Total Time spent with patient: 15 minutes  Psychiatric Specialty Exam: Same as above  Musculoskeletal: Same as above  Mental Status Per Nursing Assessment::   On Admission:     Current Mental Status by Physician: Patient denies suicidal/homicidal ideation, psychosis, and paranoia  Loss Factors: NA  Historical Factors: NA  Risk Reduction Factors:   Sense of responsibility to family  Continued Clinical Symptoms:  Alcohol/Substance Abuse/Dependencies  Cognitive Features That Contribute To Risk:  None noted    Suicide Risk:  Minimal: No identifiable suicidal ideation.  Patients presenting with no risk factors but with morbid ruminations; may be classified as minimal risk based on the severity of the depressive symptoms  Discharge Diagnoses: Same as above  Plan Of Care/Follow-up recommendations:  Activity:  Resume usual activity Diet:  Resume usual diet  Is patient on multiple antipsychotic therapies at discharge:  No   Has Patient had three or more failed trials of antipsychotic monotherapy by history:  No  Recommended Plan for Multiple Antipsychotic Therapies: NA   Rankin, Shuvon, FNP-BC 12/05/2013 10:34 AM

## 2013-12-05 NOTE — ED Notes (Signed)
Pt provided with psychoeducational materials on Warning Signs for Relapse.

## 2013-12-05 NOTE — BHH Counselor (Signed)
Pt has been accepted by [facility]) for detox treatment.  [facility] requested liver panel lab work before pt could be transported, results has been faxed and confirmed with [facility].  Pls call [facility] for transport.

## 2013-12-05 NOTE — BH Assessment (Signed)
Pt is accepted to [facility] for treatment. TTS confirmed this with Liborio NixonJanice admitting nurse at [facility].  Pt is currently awaiting transfer to [facility] via [facility]. Pt's nurse has been notified of this.   Glorious PeachNajah Nazaret Chea, MS, LCASA Assessment Counselor

## 2013-12-05 NOTE — ED Notes (Signed)
Pt aaox3.  Pt cal;m and cooperative.  Pt denies SI/HI/AH/VH.  Pt reports feeling mildly anxious at this time.  Pt awaiting placement [facility]

## 2013-12-16 ENCOUNTER — Emergency Department (HOSPITAL_BASED_OUTPATIENT_CLINIC_OR_DEPARTMENT_OTHER)
Admission: EM | Admit: 2013-12-16 | Discharge: 2013-12-16 | Disposition: A | Discharge status: Home / Self Care | Payer: Medicaid Other | Attending: Emergency Medicine | Admitting: Emergency Medicine

## 2013-12-16 ENCOUNTER — Encounter (HOSPITAL_BASED_OUTPATIENT_CLINIC_OR_DEPARTMENT_OTHER): Payer: Self-pay | Admitting: Emergency Medicine

## 2013-12-16 DIAGNOSIS — F17200 Nicotine dependence, unspecified, uncomplicated: Secondary | ICD-10-CM | POA: Insufficient documentation

## 2013-12-16 DIAGNOSIS — Z8709 Personal history of other diseases of the respiratory system: Secondary | ICD-10-CM | POA: Insufficient documentation

## 2013-12-16 DIAGNOSIS — S40269A Insect bite (nonvenomous) of unspecified shoulder, initial encounter: Secondary | ICD-10-CM | POA: Insufficient documentation

## 2013-12-16 DIAGNOSIS — Z8659 Personal history of other mental and behavioral disorders: Secondary | ICD-10-CM | POA: Insufficient documentation

## 2013-12-16 DIAGNOSIS — W57XXXA Bitten or stung by nonvenomous insect and other nonvenomous arthropods, initial encounter: Secondary | ICD-10-CM | POA: Insufficient documentation

## 2013-12-16 DIAGNOSIS — Y939 Activity, unspecified: Secondary | ICD-10-CM | POA: Insufficient documentation

## 2013-12-16 DIAGNOSIS — Y929 Unspecified place or not applicable: Secondary | ICD-10-CM | POA: Insufficient documentation

## 2013-12-16 DIAGNOSIS — Z8719 Personal history of other diseases of the digestive system: Secondary | ICD-10-CM | POA: Insufficient documentation

## 2013-12-16 DIAGNOSIS — Z792 Long term (current) use of antibiotics: Secondary | ICD-10-CM | POA: Insufficient documentation

## 2013-12-16 MED ORDER — ACETAMINOPHEN 325 MG PO TABS: 650.0000 mg | ORAL_TABLET | Freq: Once | ORAL | Status: DC

## 2013-12-16 NOTE — ED Notes (Signed)
3 to 4 cm reddened area with center point raised induration no drainage noted that is warm to the touch and pain reports site is painful with pain that increases with touch

## 2013-12-16 NOTE — Discharge Instructions (Signed)

## 2013-12-16 NOTE — ED Notes (Signed)
Spider bite to right shoulder, pt resident at [facility], states she just finished treatment for "brown recluse spider bite" to rle

## 2014-01-04 NOTE — ED Provider Notes (Signed)
CSN: [id number]     Arrival date & time 12/16/13  2024 History   First MD Initiated Contact with Patient 12/16/13 2120     Chief Complaint  Patient presents with  . Insect Bite     (Consider location/radiation/quality/duration/timing/severity/associated sxs/prior Treatment) HPI Comments: She feels she was bitten in the shoulder by something after developing swelling and pain to right shoulder. She was concerned having been recently treated for a brown recluse spider bite. No fever. No nausea or vomiting.   The history is provided by the patient. No language interpreter was used.    Past Medical History  Diagnosis Date  . Abnormal Pap smear   . GERD (gastroesophageal reflux disease)     tums prn  . Headache(784.0)   . Bronchitis   . Depression    Past Surgical History  Procedure Laterality Date  . Dental surgery    . Cervical conization w/bx  12/13/2011    Procedure: CONIZATION CERVIX WITH BIOPSY;  Surgeon: Allie BossierMyra C Dove, MD;  Location: [facility];  Service: Gynecology;  Laterality: N/A;   Family History  Problem Relation Age of Onset  . Cancer Mother     unsure   History  Substance Use Topics  . Smoking status: Current Every Day Smoker -- 0.50 packs/day for 20 years    Types: Cigarettes  . Smokeless tobacco: Never Used  . Alcohol Use: Yes   OB History   Grav Para Term Preterm Abortions TAB SAB Ect Mult Living   1 1 1       1      Review of Systems  Constitutional: Negative for fever.  Gastrointestinal: Negative for nausea and vomiting.  Musculoskeletal: Negative for myalgias.  Skin:       See HPI.      Allergies  Review of patient's allergies indicates no known allergies.  Home Medications   Prior to Admission medications   Medication Sig Start Date End Date Taking? Authorizing Provider  albuterol (PROVENTIL HFA;VENTOLIN HFA) 108 (90 BASE) MCG/ACT inhaler Inhale 2 puffs into the lungs every 6 (six) hours as needed for wheezing or shortness of breath.     Historical Provider, MD  sulfamethoxazole-trimethoprim (BACTRIM DS) 800-160 MG per tablet Take 1 tablet by mouth 2 (two) times daily.    Historical Provider, MD   BP 128/84  Pulse 74  Temp(Src) 98.3 F (36.8 C) (Oral)  Resp 20  Ht 5' 8.5" (1.74 m)  Wt 184 lb (83.462 kg)  BMI 27.57 kg/m2  SpO2 100%  LMP 10/27/2013 Physical Exam  Constitutional: She is oriented to person, place, and time. She appears well-developed and well-nourished.  Neck: Normal range of motion.  Pulmonary/Chest: Effort normal.  Neurological: She is alert and oriented to person, place, and time.  Skin: Skin is warm and dry.  Red, inflammed area posterior right shoulder. No drainage, pustule, fluctuance.     ED Course  Procedures (including critical care time) Labs Review Labs Reviewed - No data to display  Imaging Review No results found.   EKG Interpretation None      MDM   Final diagnoses:  Insect bite    Uncomplicated insect bite.     Arnoldo HookerShari A Ereka Brau, PA-C 01/04/14 1653

## 2014-01-05 NOTE — ED Provider Notes (Signed)
Medical screening examination/treatment/procedure(s) were performed by non-physician practitioner and as supervising physician I was immediately available for consultation/collaboration.    Rehan Holness L Krystl Wickware, MD 01/05/14 1445 

## 2014-04-22 ENCOUNTER — Encounter (HOSPITAL_BASED_OUTPATIENT_CLINIC_OR_DEPARTMENT_OTHER): Payer: Self-pay | Admitting: Emergency Medicine

## 2014-06-20 ENCOUNTER — Encounter (HOSPITAL_COMMUNITY): Payer: Self-pay | Admitting: Emergency Medicine

## 2014-06-20 ENCOUNTER — Emergency Department (HOSPITAL_COMMUNITY)
Admission: EM | Admit: 2014-06-20 | Discharge: 2014-06-21 | Disposition: A | Discharge status: Home / Self Care | Payer: Self-pay | Attending: Emergency Medicine | Admitting: Emergency Medicine

## 2014-06-20 DIAGNOSIS — Z792 Long term (current) use of antibiotics: Secondary | ICD-10-CM | POA: Insufficient documentation

## 2014-06-20 DIAGNOSIS — F10929 Alcohol use, unspecified with intoxication, unspecified: Secondary | ICD-10-CM

## 2014-06-20 DIAGNOSIS — X58XXXA Exposure to other specified factors, initial encounter: Secondary | ICD-10-CM | POA: Insufficient documentation

## 2014-06-20 DIAGNOSIS — R0602 Shortness of breath: Secondary | ICD-10-CM

## 2014-06-20 DIAGNOSIS — Z8709 Personal history of other diseases of the respiratory system: Secondary | ICD-10-CM | POA: Insufficient documentation

## 2014-06-20 DIAGNOSIS — F10129 Alcohol abuse with intoxication, unspecified: Secondary | ICD-10-CM | POA: Insufficient documentation

## 2014-06-20 DIAGNOSIS — Z720 Tobacco use: Secondary | ICD-10-CM | POA: Insufficient documentation

## 2014-06-20 DIAGNOSIS — Z8659 Personal history of other mental and behavioral disorders: Secondary | ICD-10-CM | POA: Insufficient documentation

## 2014-06-20 DIAGNOSIS — Y9289 Other specified places as the place of occurrence of the external cause: Secondary | ICD-10-CM | POA: Insufficient documentation

## 2014-06-20 DIAGNOSIS — S40811A Abrasion of right upper arm, initial encounter: Secondary | ICD-10-CM | POA: Insufficient documentation

## 2014-06-20 DIAGNOSIS — S8011XA Contusion of right lower leg, initial encounter: Secondary | ICD-10-CM | POA: Insufficient documentation

## 2014-06-20 DIAGNOSIS — Y998 Other external cause status: Secondary | ICD-10-CM | POA: Insufficient documentation

## 2014-06-20 DIAGNOSIS — Z8719 Personal history of other diseases of the digestive system: Secondary | ICD-10-CM | POA: Insufficient documentation

## 2014-06-20 DIAGNOSIS — Y9389 Activity, other specified: Secondary | ICD-10-CM | POA: Insufficient documentation

## 2014-06-20 HISTORY — DX: Other psychoactive substance abuse, uncomplicated: F19.10

## 2014-06-20 MED ORDER — SODIUM CHLORIDE 0.9 % IV BOLUS (SEPSIS)
1000.0000 mL | Freq: Once | INTRAVENOUS | Status: AC
  Administered 2014-06-21: 1000 mL via INTRAVENOUS

## 2014-06-20 MED ORDER — SODIUM CHLORIDE 0.9 % IV SOLN
INTRAVENOUS | Status: DC
  Administered 2014-06-21: 02:00:00 via INTRAVENOUS

## 2014-06-20 MED ORDER — ONDANSETRON HCL 4 MG/2ML IJ SOLN
4.0000 mg | Freq: Once | INTRAMUSCULAR | Status: AC
  Administered 2014-06-21: 4 mg via INTRAVENOUS
  Filled 2014-06-20: qty 2
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
## 2014-06-21 ENCOUNTER — Emergency Department (HOSPITAL_COMMUNITY): Payer: Medicaid Other

## 2014-06-21 ENCOUNTER — Emergency Department (HOSPITAL_COMMUNITY): Payer: Self-pay

## 2014-06-21 LAB — CBC WITH DIFFERENTIAL/PLATELET
Basophils Absolute: 0 10*3/uL (ref 0.0–0.1)
Basophils Relative: 0 % (ref 0–1)
Eosinophils Absolute: 0.2 10*3/uL (ref 0.0–0.7)
Eosinophils Relative: 1 % (ref 0–5)
HCT: 45.6 % (ref 36.0–46.0)
Hemoglobin: 15.3 g/dL — ABNORMAL HIGH (ref 12.0–15.0)
Lymphocytes Relative: 18 % (ref 12–46)
Lymphs Abs: 3.1 10*3/uL (ref 0.7–4.0)
MCH: 30 pg (ref 26.0–34.0)
MCHC: 33.6 g/dL (ref 30.0–36.0)
MCV: 89.4 fL (ref 78.0–100.0)
Monocytes Absolute: 1 10*3/uL (ref 0.1–1.0)
Monocytes Relative: 6 % (ref 3–12)
Neutro Abs: 12.7 10*3/uL — ABNORMAL HIGH (ref 1.7–7.7)
Neutrophils Relative %: 75 % (ref 43–77)
Platelets: ADEQUATE 10*3/uL (ref 150–400)
RBC: 5.1 MIL/uL (ref 3.87–5.11)
RDW: 12.9 % (ref 11.5–15.5)
WBC: 17 10*3/uL — ABNORMAL HIGH (ref 4.0–10.5)

## 2014-06-21 LAB — COMPREHENSIVE METABOLIC PANEL
ALT: 39 U/L — ABNORMAL HIGH (ref 0–35)
AST: 42 U/L — ABNORMAL HIGH (ref 0–37)
Albumin: 4.4 g/dL (ref 3.5–5.2)
Alkaline Phosphatase: 83 U/L (ref 39–117)
Anion gap: 12 (ref 5–15)
BUN: 9 mg/dL (ref 6–23)
CO2: 23 mmol/L (ref 19–32)
Calcium: 8.8 mg/dL (ref 8.4–10.5)
Chloride: 107 mEq/L (ref 96–112)
Creatinine, Ser: 0.73 mg/dL (ref 0.50–1.10)
GFR calc Af Amer: >90 mL/min (ref >90)
GFR calc non Af Amer: >90 mL/min (ref >90)
Glucose, Bld: 102 mg/dL — ABNORMAL HIGH (ref 70–99)
Potassium: 3.1 mmol/L — ABNORMAL LOW (ref 3.5–5.1)
Sodium: 142 mmol/L (ref 135–145)
Total Bilirubin: 0.5 mg/dL (ref 0.3–1.2)
Total Protein: 7.9 g/dL (ref 6.0–8.3)

## 2014-06-21 LAB — ETHANOL: Alcohol, Ethyl (B): 191 mg/dL — ABNORMAL HIGH (ref 0–9)

## 2014-06-21 MED ORDER — ACETAMINOPHEN 500 MG PO TABS
1000.0000 mg | ORAL_TABLET | Freq: Once | ORAL | Status: AC
  Administered 2014-06-21: 1000 mg via ORAL
  Filled 2014-06-21: qty 2

## 2014-06-21 MED ORDER — POTASSIUM CHLORIDE 10 MEQ/100ML IV SOLN
10.0000 meq | Freq: Once | INTRAVENOUS | Status: AC
  Administered 2014-06-21: 10 meq via INTRAVENOUS
  Filled 2014-06-21: qty 100

## 2014-06-21 NOTE — ED Notes (Signed)
Pt continues to be uncooperative, rolling around on stretcher, IV fluids started and Zofran given.
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
## 2014-06-21 NOTE — ED Notes (Signed)
Pt ambulated to and from the bathroom.

## 2014-06-21 NOTE — Discharge Instructions (Signed)
Alcohol Intoxication °Alcohol intoxication occurs when the amount of alcohol that a person has consumed impairs his or her ability to mentally and physically function. Alcohol directly impairs the normal chemical activity of the brain. Drinking large amounts of alcohol can lead to changes in mental function and behavior, and it can cause many physical effects that can be harmful.  °Alcohol intoxication can range in severity from mild to very severe. Various factors can affect the level of intoxication that occurs, such as the person's age, gender, weight, frequency of alcohol consumption, and the presence of other medical conditions (such as diabetes, seizures, or heart conditions). Dangerous levels of alcohol intoxication may occur when people drink large amounts of alcohol in a short period (binge drinking). Alcohol can also be especially dangerous when combined with certain prescription medicines or "recreational" drugs. °SIGNS AND SYMPTOMS °Some common signs and symptoms of mild alcohol intoxication include: °· Loss of coordination. °· Changes in mood and behavior. °· Impaired judgment. °· Slurred speech. °As alcohol intoxication progresses to more severe levels, other signs and symptoms will appear. These may include: °· Vomiting. °· Confusion and impaired memory. °· Slowed breathing. °· Seizures. °· Loss of consciousness. °DIAGNOSIS  °Your health care provider will take a medical history and perform a physical exam. You will be asked about the amount and type of alcohol you have consumed. Blood tests will be done to measure the concentration of alcohol in your blood. In many places, your blood alcohol level must be lower than 80 mg/dL (0.08%) to legally drive. However, many dangerous effects of alcohol can occur at much lower levels.  °TREATMENT  °People with alcohol intoxication often do not require treatment. Most of the effects of alcohol intoxication are temporary, and they go away as the alcohol naturally  leaves the body. Your health care provider will monitor your condition until you are stable enough to go home. Fluids are sometimes given through an IV access tube to help prevent dehydration.  °HOME CARE INSTRUCTIONS °· Do not drive after drinking alcohol. °· Stay hydrated. Drink enough water and fluids to keep your urine clear or pale yellow. Avoid caffeine.   °· Only take over-the-counter or prescription medicines as directed by your health care provider.   °SEEK MEDICAL CARE IF:  °· You have persistent vomiting.   °· You do not feel better after a few days. °· You have frequent alcohol intoxication. Your health care provider can help determine if you should see a substance use treatment counselor. °SEEK IMMEDIATE MEDICAL CARE IF:  °· You become shaky or tremble when you try to stop drinking.   °· You shake uncontrollably (seizure).   °· You throw up (vomit) blood. This may be bright red or may look like black coffee grounds.   °· You have blood in your stool. This may be bright red or may appear as a black, tarry, bad smelling stool.   °· You become lightheaded or faint.   °MAKE SURE YOU:  °· Understand these instructions. °· Will watch your condition. °· Will get help right away if you are not doing well or get worse. °Document Released: 03/17/2005 Document Revised: 02/07/2013 Document Reviewed: 11/10/2012 °[employer]® Patient Information ©2015 [employer]. This information is not intended to replace advice given to you by your health care provider. Make sure you discuss any questions you have with your health care provider. ° °Contusion °A contusion is a deep bruise. Contusions are the result of an injury that caused bleeding under the skin. The contusion may turn blue, purple,   or yellow. Minor injuries will give you a painless contusion, but more severe contusions may stay painful and swollen for a few weeks.  °CAUSES  °A contusion is usually caused by a blow, trauma, or direct force to an area of the  body. °SYMPTOMS  °· Swelling and redness of the injured area. °· Bruising of the injured area. °· Tenderness and soreness of the injured area. °· Pain. °DIAGNOSIS  °The diagnosis can be made by taking a history and physical exam. An X-ray, CT scan, or MRI may be needed to determine if there were any associated injuries, such as fractures. °TREATMENT  °Specific treatment will depend on what area of the body was injured. In general, the best treatment for a contusion is resting, icing, elevating, and applying cold compresses to the injured area. Over-the-counter medicines may also be recommended for pain control. Ask your caregiver what the best treatment is for your contusion. °HOME CARE INSTRUCTIONS  °· Put ice on the injured area. °¨ Put ice in a plastic bag. °¨ Place a towel between your skin and the bag. °¨ Leave the ice on for 15-20 minutes, 3-4 times a day, or as directed by your health care provider. °· Only take over-the-counter or prescription medicines for pain, discomfort, or fever as directed by your caregiver. Your caregiver may recommend avoiding anti-inflammatory medicines (aspirin, ibuprofen, and naproxen) for 48 hours because these medicines may increase bruising. °· Rest the injured area. °· If possible, elevate the injured area to reduce swelling. °SEEK IMMEDIATE MEDICAL CARE IF:  °· You have increased bruising or swelling. °· You have pain that is getting worse. °· Your swelling or pain is not relieved with medicines. °MAKE SURE YOU:  °· Understand these instructions. °· Will watch your condition. °· Will get help right away if you are not doing well or get worse. °Document Released: 03/17/2005 Document Revised: 06/12/2013 Document Reviewed: 04/12/2011 °ExitCare® Patient Information ©2015 [employer]. This information is not intended to replace advice given to you by your health care provider. Make sure you discuss any questions you have with your health care provider. ° °

## 2015-08-27 ENCOUNTER — Emergency Department (HOSPITAL_COMMUNITY): Payer: Self-pay

## 2015-08-27 ENCOUNTER — Emergency Department (HOSPITAL_COMMUNITY)
Admission: EM | Admit: 2015-08-27 | Discharge: 2015-08-27 | Disposition: A | Discharge status: Home / Self Care | Payer: Self-pay | Attending: Emergency Medicine | Admitting: Emergency Medicine

## 2015-08-27 ENCOUNTER — Encounter (HOSPITAL_COMMUNITY): Payer: Self-pay | Admitting: Vascular Surgery

## 2015-08-27 DIAGNOSIS — M160 Bilateral primary osteoarthritis of hip: Secondary | ICD-10-CM

## 2015-08-27 DIAGNOSIS — M5441 Lumbago with sciatica, right side: Secondary | ICD-10-CM | POA: Insufficient documentation

## 2015-08-27 DIAGNOSIS — M25551 Pain in right hip: Secondary | ICD-10-CM

## 2015-08-27 DIAGNOSIS — N390 Urinary tract infection, site not specified: Secondary | ICD-10-CM | POA: Insufficient documentation

## 2015-08-27 DIAGNOSIS — R739 Hyperglycemia, unspecified: Secondary | ICD-10-CM | POA: Insufficient documentation

## 2015-08-27 DIAGNOSIS — Z8719 Personal history of other diseases of the digestive system: Secondary | ICD-10-CM | POA: Insufficient documentation

## 2015-08-27 DIAGNOSIS — Z79899 Other long term (current) drug therapy: Secondary | ICD-10-CM | POA: Insufficient documentation

## 2015-08-27 DIAGNOSIS — F17210 Nicotine dependence, cigarettes, uncomplicated: Secondary | ICD-10-CM | POA: Insufficient documentation

## 2015-08-27 DIAGNOSIS — M1612 Unilateral primary osteoarthritis, left hip: Secondary | ICD-10-CM | POA: Insufficient documentation

## 2015-08-27 DIAGNOSIS — Z8659 Personal history of other mental and behavioral disorders: Secondary | ICD-10-CM | POA: Insufficient documentation

## 2015-08-27 DIAGNOSIS — M1611 Unilateral primary osteoarthritis, right hip: Secondary | ICD-10-CM | POA: Insufficient documentation

## 2015-08-27 LAB — URINALYSIS, ROUTINE W REFLEX MICROSCOPIC
Bilirubin Urine: NEGATIVE
Glucose, UA: >1000 mg/dL — AB
Hgb urine dipstick: NEGATIVE
Ketones, ur: NEGATIVE mg/dL
Leukocytes, UA: NEGATIVE
Nitrite: POSITIVE — AB
Protein, ur: NEGATIVE mg/dL
Specific Gravity, Urine: 1.02 (ref 1.005–1.030)
pH: 6 (ref 5.0–8.0)

## 2015-08-27 LAB — URINE MICROSCOPIC-ADD ON

## 2015-08-27 LAB — CBG MONITORING, ED: Glucose-Capillary: 241 mg/dL — ABNORMAL HIGH (ref 65–99)

## 2015-08-27 MED ORDER — CEPHALEXIN 500 MG PO CAPS: 500.0000 mg | ORAL_CAPSULE | Freq: Four times a day (QID) | ORAL | Status: DC

## 2015-08-27 MED ORDER — CYCLOBENZAPRINE HCL 10 MG PO TABS: 10.0000 mg | ORAL_TABLET | Freq: Two times a day (BID) | ORAL | Status: DC | PRN

## 2015-08-27 MED ORDER — IBUPROFEN 800 MG PO TABS: 800.0000 mg | ORAL_TABLET | Freq: Three times a day (TID) | ORAL | Status: DC

## 2015-08-27 MED ORDER — PREDNISONE 20 MG PO TABS: 40.0000 mg | ORAL_TABLET | Freq: Every day | ORAL | Status: DC

## 2015-08-27 MED ORDER — HYDROCODONE-ACETAMINOPHEN 5-325 MG PO TABS: 2.0000 | ORAL_TABLET | ORAL | Status: DC | PRN

## 2015-08-27 MED ORDER — TRAMADOL HCL 50 MG PO TABS
50.0000 mg | ORAL_TABLET | Freq: Once | ORAL | Status: AC
  Administered 2015-08-27: 50 mg via ORAL
  Filled 2015-08-27: qty 1

## 2015-08-27 MED ORDER — PREDNISONE 20 MG PO TABS
60.0000 mg | ORAL_TABLET | Freq: Once | ORAL | Status: AC
  Administered 2015-08-27: 60 mg via ORAL
  Filled 2015-08-27: qty 3

## 2015-08-27 MED ORDER — DIAZEPAM 5 MG/ML IJ SOLN
2.5000 mg | Freq: Once | INTRAMUSCULAR | Status: AC
  Administered 2015-08-27: 2.5 mg via INTRAMUSCULAR
  Filled 2015-08-27: qty 2

## 2015-08-27 MED ORDER — DEXAMETHASONE SODIUM PHOSPHATE 10 MG/ML IJ SOLN: 10.0000 mg | Freq: Once | INTRAMUSCULAR | Status: DC

## 2015-08-27 MED ORDER — KETOROLAC TROMETHAMINE 60 MG/2ML IM SOLN
60.0000 mg | Freq: Once | INTRAMUSCULAR | Status: AC
  Administered 2015-08-27: 60 mg via INTRAMUSCULAR
  Filled 2015-08-27: qty 2

## 2015-08-27 NOTE — ED Notes (Signed)
Pt ambulates independently and with steady gait at time of discharge. Discharge instructions and follow up information reviewed with patient. No other questions or concerns voiced at this time. RX x 5.

## 2015-08-27 NOTE — ED Notes (Signed)
Pt reports to the ED for eval of low back pain that radiates into her right leg. She was dx with arthritis 1 year ago and she was on Tylenol but it is no longer helping. Pt reports increasing pain x 3 months. She also reports right foot numbness intermittently x 3 months as well. Pt reports increased falls as well but denies any injury to the back from the falls. Falls precipitated by pain or knee locking up. Denies any tingling, paralysis, or bowel or bladder incontinence. Pt A&Ox4, resp e/u, and skin warm and dry.

## 2015-08-27 NOTE — Care Management Note (Signed)
Case Management Note  Patient Details  Name: Nancy [name] MRN: [id number] Date of Birth: Nov 15, 1965  Subjective/Objective:                  Patient presented to [facility] ED with hip pains, incidentally for CBG 231, OP follow up needed  Action/Plan:  CM met with patient at bedside, patient confirmed being without health insurance. Discussed [facility] indigent care, patient is agreeable to establish care with the clinic. Discussed the services rendered at the clinic. Scheduled a follow up appt 09/02/15 at 2:30p with Dr. Jarold Song, information placed on AVS. Stressed the importance of keeping this appointment,and if unable to make it contact the clinic at the number provided 24 -48 hours prior to cancel, teach back done  patient verbalized understanding. No further ED CM needs identified  Expected Discharge Date:       08/27/15           Expected Discharge Plan:   [facility]  In-House Referral:     Discharge [place] with [facility]  Post Acute Care Choice:    Choice offered to:   patient  DME Arranged:    DME Agency:     HH Arranged:    HH Agency:     Status of Service:     Medicare Important Message Given:    Date Medicare IM Given:    Medicare IM give by:    Date Additional Medicare IM Given:    Additional Medicare Important Message give by:     If discussed at [place] of Stay Meetings, dates discussed:    Additional CommentsLaurena Slimmer, RN 08/27/2015, 6:27 PM

## 2015-08-27 NOTE — ED Provider Notes (Signed)
CSN: [id number]     Arrival date & time 08/27/15  1349 History  By signing my name below, I, Nancy [name], attest that this documentation has been prepared under the direction and in the presence of Danelle Berry, PA-C. Electronically Signed: Angelene Giovanni, ED Scribe. 08/27/2015. 4:58 PM.    Chief Complaint  Patient presents with  . Back Pain   The history is provided by the patient. No language interpreter was used.   HPI Comments: Nancy [name] is a 50 y.o. female with a hx of arthritis and polysubstance abuse who presents to the Emergency Department complaining of gradually worsening 10/10 bilateral anterior right hip pain that has been radiating to her lower back and also radiating down her right leg onset 3 months ago. She reports associated numbness in her right foot that lasts for approx one hour, right leg swelling, and intermittent chills which she attributes to pain. She feels like it "pops out" sometimes when she stands up and she has to "pop it back in" to be able to walk. No alleviating factors noted. Pt has not tried any medications PTA. She denies any recent falls, injuries, or trauma. She denies any IV drug use, cancers, or chronic steroid use. She denies any fever, numbness in her bilateral arms, saddle anesthesia, or bladder/bowel incontinence. No dysuria, hematuria, vaginal discharge, abdominal pain, flank pain, N, V, fevers.   Past Medical History  Diagnosis Date  . Abnormal Pap smear   . GERD (gastroesophageal reflux disease)     tums prn  . Headache(784.0)   . Bronchitis   . Depression   . Polysubstance abuse    Past Surgical History  Procedure Laterality Date  . Dental surgery    . Cervical conization w/bx  12/13/2011    Procedure: CONIZATION CERVIX WITH BIOPSY;  Surgeon: Allie Bossier, MD;  Location: [facility];  Service: Gynecology;  Laterality: N/A;   Family History  Problem Relation Age of Onset  . Cancer Mother     unsure   Social History  Substance Use  Topics  . Smoking status: Current Every Day Smoker -- 0.50 packs/day for 20 years    Types: Cigarettes  . Smokeless tobacco: Never Used  . Alcohol Use: Yes   OB History    Gravida Para Term Preterm AB TAB SAB Ectopic Multiple Living   Review of Systems  Constitutional: Positive for chills. Negative for fever.  Cardiovascular: Positive for leg swelling.  Musculoskeletal: Positive for back pain (lower ) and arthralgias (right anterior hip).  Neurological: Positive for numbness (right foot).  All other systems reviewed and are negative.     Allergies  Review of patient's allergies indicates no known allergies.  Home Medications   Prior to Admission medications   Medication Sig Start Date End Date Taking? Authorizing Provider  albuterol (PROVENTIL HFA;VENTOLIN HFA) 108 (90 BASE) MCG/ACT inhaler Inhale 2 puffs into the lungs every 6 (six) hours as needed for wheezing or shortness of breath.    Historical Provider, MD  cephALEXin (KEFLEX) 500 MG capsule Take 1 capsule (500 mg total) by mouth 4 (four) times daily. 08/27/15   Danelle Berry, PA-C  cyclobenzaprine (FLEXERIL) 10 MG tablet Take 1 tablet (10 mg total) by mouth 2 (two) times daily as needed for muscle spasms. 08/27/15   Danelle Berry, PA-C  HYDROcodone-acetaminophen (NORCO/VICODIN) 5-325 MG tablet Take 2 tablets by mouth every 4 (four) hours as needed.  08/27/15   Danelle Berry, PA-C  ibuprofen (ADVIL,MOTRIN) 800 MG tablet Take 1 tablet (800 mg total) by mouth 3 (three) times daily. 08/27/15   Danelle Berry, PA-C  predniSONE (DELTASONE) 20 MG tablet Take 2 tablets (40 mg total) by mouth daily. Take 40 mg by mouth daily for 3 days, then 20mg  by mouth daily for 3 days, then 10mg  daily for 3 days 08/27/15   Danelle Berry, PA-C   BP 125/88 mmHg  Pulse 82  Temp(Src) 97.7 F (36.5 C) (Oral)  Resp 18  SpO2 94% Physical Exam  Constitutional: She is oriented to person, place, and time. She appears well-developed and well-nourished.  No distress.  HENT:  Head: Normocephalic and atraumatic.  Right Ear: External ear normal.  Left Ear: External ear normal.  Nose: Nose normal.  Mouth/Throat: Oropharynx is clear and moist. No oropharyngeal exudate.  Eyes: Conjunctivae and EOM are normal. Pupils are equal, round, and reactive to light. Right eye exhibits no discharge. Left eye exhibits no discharge. No scleral icterus.  Neck: Normal range of motion. Neck supple. No JVD present. No tracheal deviation present. No thyromegaly present.  Cardiovascular: Normal rate, regular rhythm, normal heart sounds and intact distal pulses.  Exam reveals no gallop and no friction rub.   No murmur heard. Pulmonary/Chest: Effort normal and breath sounds normal. No stridor. No respiratory distress. She has no wheezes. She has no rales. She exhibits no tenderness.  Abdominal: Soft. Bowel sounds are normal. She exhibits no distension and no mass. There is no tenderness. There is no rebound and no guarding.  No CVA tenderness  Musculoskeletal: She exhibits tenderness. She exhibits no edema.       Right hip: She exhibits decreased range of motion and tenderness. She exhibits normal strength, no bony tenderness, no swelling and no crepitus.       Lumbar back: She exhibits tenderness and bony tenderness. She exhibits normal range of motion, no swelling, no edema, no pain and no spasm.       Back:       Right upper leg: She exhibits no tenderness, no bony tenderness, no swelling and no edema.  Right hip limited external and internal rotation, limited by pain   Lymphadenopathy:    She has no cervical adenopathy.  Neurological: She is alert and oriented to person, place, and time. She has normal strength and normal reflexes. She displays no atrophy and no tremor. No cranial nerve deficit or sensory deficit. She exhibits normal muscle tone. She displays no seizure activity. Coordination normal.  Antalgic gait, normal sensation to sharp and light touch, 5/5  strength in LE  Skin: Skin is warm and dry. No rash noted. She is not diaphoretic. No erythema. No pallor.  Psychiatric: She has a normal mood and affect. Her behavior is normal. Judgment and thought content normal.  Nursing note and vitals reviewed.   ED Course  Procedures (including critical care time) DIAGNOSTIC STUDIES: Oxygen Saturation is 95% on RA, normal by my interpretation.    COORDINATION OF CARE: 3:16 PM- Pt advised of plan for treatment and pt agrees. Pt will receive hip x-ray for further evaluation.    Results for orders placed or performed during the hospital encounter of 08/27/15  Urinalysis, Routine w reflex microscopic (not at [facility])  Result Value Ref Range   Color, Urine YELLOW YELLOW   APPearance CLOUDY (A) CLEAR   Specific Gravity, Urine 1.020 1.005 - 1.030   pH 6.0 5.0 - 8.0   Glucose, UA >  1000 (A) NEGATIVE mg/dL   Hgb urine dipstick NEGATIVE NEGATIVE   Bilirubin Urine NEGATIVE NEGATIVE   Ketones, ur NEGATIVE NEGATIVE mg/dL   Protein, ur NEGATIVE NEGATIVE mg/dL   Nitrite POSITIVE (A) NEGATIVE   Leukocytes, UA NEGATIVE NEGATIVE  Urine microscopic-add on  Result Value Ref Range   Squamous Epithelial / LPF 0-5 (A) NONE SEEN   WBC, UA 0-5 0 - 5 WBC/hpf   RBC / HPF 0-5 0 - 5 RBC/hpf   Bacteria, UA MANY (A) NONE SEEN  POC CBG, ED  Result Value Ref Range   Glucose-Capillary 241 (H) 65 - 99 mg/dL    Imaging Review  DG HIP UNILAT WITH PELVIS 2-3 VIEWS RIGHT (Final result) Result time: 08/27/15 16:10:28   Final result by Rad Results In Interface (08/27/15 16:10:28)   Narrative:   CLINICAL DATA: Intermittent right hip and leg pain for 1 year, worsening over the last 3 weeks. No related acute injury.  EXAM: DG HIP (WITH OR WITHOUT PELVIS) 2-3V RIGHT  COMPARISON: None.  FINDINGS: The mineralization and alignment are normal. There is no evidence of acute fracture, dislocation or femoral head avascular necrosis. There are osteoarthritic changes of  both hips which are worse on the right. The sacroiliac joints appear normal. There are mild degenerative changes in the lower lumbar spine.  IMPRESSION: Age advanced osteoarthritic changes of both hips, worse on the right. No acute osseous findings.   Electronically Signed By: Carey Bullocks M.D. On: 08/27/2015 16:10          DG Lumbar Spine Complete (Final result) Result time: 08/27/15 16:14:55   Final result by Rad Results In Interface (08/27/15 16:14:55)   Narrative:   CLINICAL DATA: Intermittent leg pain and right hip pain for about 1 year, worsening right hip pain last 3 weeks  EXAM: LUMBAR SPINE - COMPLETE 4+ VIEW  COMPARISON: None.  FINDINGS: Five views of lumbar spine submitted. No acute fracture or subluxation. Mild anterior spurring upper and lower endplate of L2 vertebral body and upper endplate of L4 vertebral body. Moderate anterior spurring upper endplate of L3 vertebral body. Minimal disc space flattening at L4-L5 and L5-S1 level. Facet degenerative changes L4 and L5 level. Alignment and vertebral body heights are preserved.  There is a ovoid calcification in right upper abdomen medially. Measures 3.5 cm. Calcified gallbladder, gallstone or calcified adrenal lesion cannot be excluded.  IMPRESSION: No acute fracture or subluxation. Mild degenerative changes as described above.There is a ovoid calcification in right upper abdomen medially. Calcified gallbladder, gallstone or calcified adrenal lesion cannot be excluded.   Electronically Signed By: Natasha Mead M.D. On: 08/27/2015 16:14     Danelle Berry, PA-C has personally reviewed and evaluated these images as part of her medical decision-making.   MDM   Patient is a 50 year old female with progressive low back pain, bilateral hip pain, with recent increase in severity of right hip pain with radiation down into her right leg and foot.  Xray significant for lumbar spine degenerative  changes and spurring, bilateral hips consistent with age advances osteoarthritic changes R>L.  While being worked up for her back pain, which is most consistent with sciatica secondary to OA and DDD, patient also complained of polyuria, polydipsia, fatigue. She denies weight loss. Her urinalysis which was run to rule out UTI/pyelo for back pain was pertinent for greater than 1000 glucose in her urine. UA was also consistent with UTI and patient states she was wondering whether or not she had a bladder infection,  however she denied dysuria and hematuria.  She has no abdominal pain, nausea, vomiting.  Will treat UTI and sciatica, ortho referral for joint pain follow up, pt has not PCP.  Case management assisted with establishing follow up appointment for pt for blood sugars. She was discharged in good condition, pain improved, able to ambulate without difficulty, VSS.  Return precautions reviewed  Filed Vitals:   08/27/15 1407 08/27/15 1658  BP: 115/74 125/88  Pulse: 83 82  Temp: 97.7 F (36.5 C)   TempSrc: Oral   Resp: 16 18  SpO2: 95% 94%     Final diagnoses:  Right hip pain  Bilateral low back pain with right-sided sciatica  Osteoarthritis of both hips, unspecified osteoarthritis type  UTI (lower urinary tract infection)  Hyperglycemia   I personally performed the services described in this documentation, which was scribed in my presence. The recorded information has been reviewed and is accurate.     Danelle BerryLeisa Sindy Mccune, PA-C 08/29/15 1705  Alvira MondayErin Schlossman, MD 08/29/15 1952

## 2015-08-27 NOTE — ED Notes (Signed)
Patient not in subwaiting room when called.

## 2015-08-27 NOTE — Discharge Instructions (Signed)
Hip Pain Your hip is the joint between your upper legs and your lower pelvis. The bones, cartilage, tendons, and muscles of your hip joint perform a lot of work each day supporting your body weight and allowing you to move around. Hip pain can range from a minor ache to severe pain in one or both of your hips. Pain may be felt on the inside of the hip joint near the groin, or the outside near the buttocks and upper thigh. You may have swelling or stiffness as well.  HOME CARE INSTRUCTIONS   Take medicines only as directed by your health care provider.  Apply ice to the injured area:  Put ice in a plastic bag.  Place a towel between your skin and the bag.  Leave the ice on for 15-20 minutes at a time, 3-4 times a day.  Keep your leg raised (elevated) when possible to lessen swelling.  Avoid activities that cause pain.  Follow specific exercises as directed by your health care provider.  Sleep with a pillow between your legs on your most comfortable side.  Record how often you have hip pain, the location of the pain, and what it feels like. SEEK MEDICAL CARE IF:   You are unable to put weight on your leg.  Your hip is red or swollen or very tender to touch.  Your pain or swelling continues or worsens after 1 week.  You have increasing difficulty walking.  You have a fever. SEEK IMMEDIATE MEDICAL CARE IF:   You have fallen.  You have a sudden increase in pain and swelling in your hip. MAKE SURE YOU:   Understand these instructions.  Will watch your condition.  Will get help right away if you are not doing well or get worse.   This information is not intended to replace advice given to you by your health care provider. Make sure you discuss any questions you have with your health care provider.   Document Released: 11/25/2009 Document Revised: 06/28/2014 Document Reviewed: 02/01/2013 Elsevier Interactive Patient Education 2016 Elsevier  Inc.  Osteoarthritis Osteoarthritis is a disease that causes soreness and inflammation of a joint. It occurs when the cartilage at the affected joint wears down. Cartilage acts as a cushion, covering the ends of bones where they meet to form a joint. Osteoarthritis is the most common form of arthritis. It often occurs in older people. The joints affected most often by this condition include those in the:  Ends of the fingers.  Thumbs.  Neck.  Lower back.  Knees.  Hips. CAUSES  Over time, the cartilage that covers the ends of bones begins to wear away. This causes bone to rub on bone, producing pain and stiffness in the affected joints.  RISK FACTORS Certain factors can increase your chances of having osteoarthritis, including:  Older age.  Excessive body weight.  Overuse of joints.  Previous joint injury. SIGNS AND SYMPTOMS   Pain, swelling, and stiffness in the joint.  Over time, the joint may lose its normal shape.  Small deposits of bone (osteophytes) may grow on the edges of the joint.  Bits of bone or cartilage can break off and float inside the joint space. This may cause more pain and damage. DIAGNOSIS  Your health care provider will do a physical exam and ask about your symptoms. Various tests may be ordered, such as:  X-rays of the affected joint.  Blood tests to rule out other types of arthritis. Additional tests may be used  to diagnose your condition. TREATMENT  Goals of treatment are to control pain and improve joint function. Treatment plans may include:  A prescribed exercise program that allows for rest and joint relief.  A weight control plan.  Pain relief techniques, such as:  Properly applied heat and cold.  Electric pulses delivered to nerve endings under the skin (transcutaneous electrical nerve stimulation [TENS]).  Massage.  Certain nutritional supplements.  Medicines to control pain, such as:  Acetaminophen.  Nonsteroidal  anti-inflammatory drugs (NSAIDs), such as naproxen.  Narcotic or central-acting agents, such as tramadol.  Corticosteroids. These can be given orally or as an injection.  Surgery to reposition the bones and relieve pain (osteotomy) or to remove loose pieces of bone and cartilage. Joint replacement may be needed in advanced states of osteoarthritis. HOME CARE INSTRUCTIONS   Take medicines only as directed by your health care provider.  Maintain a healthy weight. Follow your health care provider's instructions for weight control. This may include dietary instructions.  Exercise as directed. Your health care provider can recommend specific types of exercise. These may include:  Strengthening exercises. These are done to strengthen the muscles that support joints affected by arthritis. They can be performed with weights or with exercise bands to add resistance.  Aerobic activities. These are exercises, such as brisk walking or low-impact aerobics, that get your heart pumping.  Range-of-motion activities. These keep your joints limber.  Balance and agility exercises. These help you maintain daily living skills.  Rest your affected joints as directed by your health care provider.  Keep all follow-up visits as directed by your health care provider. SEEK MEDICAL CARE IF:   Your skin turns red.  You develop a rash in addition to your joint pain.  You have worsening joint pain.  You have a fever along with joint or muscle aches. SEEK IMMEDIATE MEDICAL CARE IF:  You have a significant loss of weight or appetite.  You have night sweats. FOR MORE INFORMATION   National Institute of Arthritis and Musculoskeletal and Skin Diseases: www.niams.http://www.myers.net/  [employer] on Aging: https://walker.com/  American College of Rheumatology: www.rheumatology.org   This information is not intended to replace advice given to you by your health care provider. Make sure you discuss any questions you  have with your health care provider.   Document Released: 06/07/2005 Document Revised: 06/28/2014 Document Reviewed: 02/12/2013 Elsevier Interactive Patient Education 2016 Elsevier Inc.  Sciatica Sciatica is pain, weakness, numbness, or tingling along your sciatic nerve. The nerve starts in the lower back and runs down the back of each leg. Nerve damage or certain conditions pinch or put pressure on the sciatic nerve. This causes the pain, weakness, and other discomforts of sciatica. HOME CARE   Only take medicine as told by your doctor.  Apply ice to the affected area for 20 minutes. Do this 3-4 times a day for the first 48-72 hours. Then try heat in the same way.  Exercise, stretch, or do your usual activities if these do not make your pain worse.  Go to physical therapy as told by your doctor.  Keep all doctor visits as told.  Do not wear high heels or shoes that are not supportive.  Get a firm mattress if your mattress is too soft to lessen pain and discomfort. GET HELP RIGHT AWAY IF:   You cannot control when you poop (bowel movement) or pee (urinate).  You have more weakness in your lower back, lower belly (pelvis), butt (buttocks), or legs.  You have redness or puffiness (swelling) of your back.  You have a burning feeling when you pee.  You have pain that gets worse when you lie down.  You have pain that wakes you from your sleep.  Your pain is worse than past pain.  Your pain lasts longer than 4 weeks.  You are suddenly losing weight without reason. MAKE SURE YOU:   Understand these instructions.  Will watch this condition.  Will get help right away if you are not doing well or get worse.   This information is not intended to replace advice given to you by your health care provider. Make sure you discuss any questions you have with your health care provider.   Document Released: 03/16/2008 Document Revised: 02/26/2015 Document Reviewed:  10/17/2011 Elsevier Interactive Patient Education 2016 Elsevier Inc.   Hyperglycemia High blood sugar (hyperglycemia) means that the level of sugar in your blood is higher than it should be. Signs of high blood sugar include:  Feeling thirsty.  Frequent peeing (urinating).  Feeling tired or sleepy.  Dry mouth.  Vision changes.  Feeling weak.  Feeling hungry but losing weight.  Numbness and tingling in your hands or feet.  Headache. When you ignore these signs, your blood sugar may keep going up. These problems may get worse, and other problems may begin. HOME CARE  Check your blood sugars as told by your doctor. Write down the numbers with the date and time.  Take the right amount of insulin or diabetes pills at the right time. Write down the dose with date and time.  Refill your insulin or diabetes pills before running out.  Watch what you eat. Follow your meal plan.  Drink liquids without sugar, such as water. Check with your doctor if you have kidney or heart disease.  Follow your doctor's orders for exercise. Exercise at the same time of day.  Keep your doctor's appointments. GET HELP RIGHT AWAY IF:   You have trouble thinking or are confused.  You have fast breathing with fruity smelling breath.  You pass out (faint).  You have 2 to 3 days of high blood sugars and you do not know why.  You have chest pain.  You are feeling sick to your stomach (nauseous) or throwing up (vomiting).  You have sudden vision changes. MAKE SURE YOU:   Understand these instructions.  Will watch your condition.  Will get help right away if you are not doing well or get worse.   This information is not intended to replace advice given to you by your health care provider. Make sure you discuss any questions you have with your health care provider.   Document Released: 04/04/2009 Document Revised: 06/28/2014 Document Reviewed: 02/11/2015 Elsevier Interactive Patient  Education [employer]

## 2015-08-28 MED FILL — CEPHALEXIN 500 MG CAPSULE: 500 | 7 days supply | Qty: 28 | Fill #0

## 2015-08-28 MED FILL — IBUPROFEN 800 MG TABLET: 800 | 20 days supply | Qty: 60 | Fill #0

## 2015-08-28 MED FILL — ?CYCLOBENZAPRINE 10 MG TABL: 10 | 10 days supply | Qty: 20 | Fill #0

## 2015-08-28 MED FILL — predniSONE 20 MG TABS: 20 | 9 days supply | Qty: 12 | Fill #0

## 2015-09-02 ENCOUNTER — Inpatient Hospital Stay: Payer: Self-pay | Admitting: Family Medicine

## 2015-09-04 ENCOUNTER — Ambulatory Visit: Payer: Self-pay | Attending: Family Medicine | Admitting: Family Medicine

## 2015-09-04 ENCOUNTER — Ambulatory Visit (HOSPITAL_BASED_OUTPATIENT_CLINIC_OR_DEPARTMENT_OTHER): Payer: Self-pay | Admitting: Clinical

## 2015-09-04 ENCOUNTER — Encounter: Payer: Self-pay | Admitting: Family Medicine

## 2015-09-04 VITALS — BP 136/83 | HR 91 | Temp 98.1°F | Resp 15 | Ht 68.0 in | Wt 205.8 lb

## 2015-09-04 DIAGNOSIS — F329 Major depressive disorder, single episode, unspecified: Secondary | ICD-10-CM | POA: Insufficient documentation

## 2015-09-04 DIAGNOSIS — F32A Depression, unspecified: Secondary | ICD-10-CM

## 2015-09-04 DIAGNOSIS — E1165 Type 2 diabetes mellitus with hyperglycemia: Secondary | ICD-10-CM | POA: Insufficient documentation

## 2015-09-04 DIAGNOSIS — M1611 Unilateral primary osteoarthritis, right hip: Secondary | ICD-10-CM | POA: Insufficient documentation

## 2015-09-04 DIAGNOSIS — Z79899 Other long term (current) drug therapy: Secondary | ICD-10-CM | POA: Insufficient documentation

## 2015-09-04 DIAGNOSIS — F4323 Adjustment disorder with mixed anxiety and depressed mood: Secondary | ICD-10-CM

## 2015-09-04 DIAGNOSIS — F1910 Other psychoactive substance abuse, uncomplicated: Secondary | ICD-10-CM | POA: Insufficient documentation

## 2015-09-04 DIAGNOSIS — F1410 Cocaine abuse, uncomplicated: Secondary | ICD-10-CM | POA: Insufficient documentation

## 2015-09-04 DIAGNOSIS — F1210 Cannabis abuse, uncomplicated: Secondary | ICD-10-CM | POA: Insufficient documentation

## 2015-09-04 LAB — GLUCOSE, POCT (MANUAL RESULT ENTRY): POC Glucose: 291 mg/dl — AB (ref 70–99)

## 2015-09-04 LAB — POCT GLYCOSYLATED HEMOGLOBIN (HGB A1C): Hemoglobin A1C: 8.6

## 2015-09-04 MED ORDER — FLUCONAZOLE 150 MG PO TABS: 150.0000 mg | ORAL_TABLET | Freq: Once | ORAL | Status: DC

## 2015-09-04 MED ORDER — GLUCOSE BLOOD VI STRP: ORAL_STRIP | Status: DC

## 2015-09-04 MED ORDER — TRUE METRIX METER DEVI: 1.0000 | Freq: Three times a day (TID) | Status: DC

## 2015-09-04 MED ORDER — METFORMIN HCL 500 MG PO TABS: ORAL_TABLET | ORAL | Status: DC

## 2015-09-04 MED ORDER — TRUEPLUS LANCETS 28G MISC: 1.0000 | Freq: Three times a day (TID) | Status: DC

## 2015-09-04 NOTE — Patient Instructions (Signed)
Diabetes Mellitus and Food It is important for you to manage your blood sugar (glucose) level. Your blood glucose level can be greatly affected by what you eat. Eating healthier foods in the appropriate amounts throughout the day at about the same time each day will help you control your blood glucose level. It can also help slow or prevent worsening of your diabetes mellitus. Healthy eating may even help you improve the level of your blood pressure and reach or maintain a healthy weight.  General recommendations for healthful eating and cooking habits include:  Eating meals and snacks regularly. Avoid going long periods of time without eating to lose weight.  Eating a diet that consists mainly of plant-based foods, such as fruits, vegetables, nuts, legumes, and whole grains.  Using low-heat cooking methods, such as baking, instead of high-heat cooking methods, such as deep frying. Work with your dietitian to make sure you understand how to use the Nutrition Facts information on food labels. HOW CAN FOOD AFFECT ME? Carbohydrates Carbohydrates affect your blood glucose level more than any other type of food. Your dietitian will help you determine how many carbohydrates to eat at each meal and teach you how to count carbohydrates. Counting carbohydrates is important to keep your blood glucose at a healthy level, especially if you are using insulin or taking certain medicines for diabetes mellitus. Alcohol Alcohol can cause sudden decreases in blood glucose (hypoglycemia), especially if you use insulin or take certain medicines for diabetes mellitus. Hypoglycemia can be a life-threatening condition. Symptoms of hypoglycemia (sleepiness, dizziness, and disorientation) are similar to symptoms of having too much alcohol.  If your health care provider has given you approval to drink alcohol, do so in moderation and use the following guidelines:  Women should not have more than one drink per day, and men  should not have more than two drinks per day. One drink is equal to:  12 oz of beer.  5 oz of wine.  1 oz of hard liquor.  Do not drink on an empty stomach.  Keep yourself hydrated. Have water, diet soda, or unsweetened iced tea.  Regular soda, juice, and other mixers might contain a lot of carbohydrates and should be counted. WHAT FOODS ARE NOT RECOMMENDED? As you make food choices, it is important to remember that all foods are not the same. Some foods have fewer nutrients per serving than other foods, even though they might have the same number of calories or carbohydrates. It is difficult to get your body what it needs when you eat foods with fewer nutrients. Examples of foods that you should avoid that are high in calories and carbohydrates but low in nutrients include:  Trans fats (most processed foods list trans fats on the Nutrition Facts label).  Regular soda.  Juice.  Candy.  Sweets, such as cake, pie, doughnuts, and cookies.  Fried foods. WHAT FOODS CAN I EAT? Eat nutrient-rich foods, which will nourish your body and keep you healthy. The food you should eat also will depend on several factors, including:  The calories you need.  The medicines you take.  Your weight.  Your blood glucose level.  Your blood pressure level.  Your cholesterol level. You should eat a variety of foods, including:  Protein.  Lean cuts of meat.  Proteins low in saturated fats, such as fish, egg whites, and beans. Avoid processed meats.  Fruits and vegetables.  Fruits and vegetables that may help control blood glucose levels, such as apples, mangoes, and   yams.  Dairy products.  Choose fat-free or low-fat dairy products, such as milk, yogurt, and cheese.  Grains, bread, pasta, and rice.  Choose whole grain products, such as multigrain bread, whole oats, and brown rice. These foods may help control blood pressure.  Fats.  Foods containing healthful fats, such as nuts,  avocado, olive oil, canola oil, and fish. DOES EVERYONE WITH DIABETES MELLITUS HAVE THE SAME MEAL PLAN? Because every person with diabetes mellitus is different, there is not one meal plan that works for everyone. It is very important that you meet with a dietitian who will help you create a meal plan that is just right for you.   This information is not intended to replace advice given to you by your health care provider. Make sure you discuss any questions you have with your health care provider.   Document Released: 03/04/2005 Document Revised: 06/28/2014 Document Reviewed: 05/04/2013 Elsevier Interactive Patient Education 2016 Elsevier Inc.  

## 2015-09-04 NOTE — Progress Notes (Signed)
Patient teary in exam room Pain in hips and knees States she cannot give herself injections of insulin " I will pass out." Asking to see social worker

## 2015-09-04 NOTE — Progress Notes (Signed)
ASSESSMENT: Pt currently experiencing Adjustment reaction with anxiety and depression. Pt needs to f[facility]u with PCP and [facility]; would benefit from psychoeducation and brief therapeutic interventions regarding coping with symptoms of anxiety and depression, and may benefit from community resources.  Stage of Change: contemplative  PLAN: 1. F/U with behavioral health consultant in three weeks, or sooner, as needed 2. Psychiatric Medications: none. 3. Behavioral recommendation(s):   -Make appointment to see financial counselors at [facility] -Go to social services tomorrow, 09-05-15 to apply for food stamps -[facility](to be able to talk to disability lawyers and apply for housing) -Consider reading educational material regarding coping with symptoms of anxiety and depression  SUBJECTIVE: Pt. referred by Dr Venetia NightAmao for symptoms of depression/adjusting to new diagnosis:  Pt. reports the following symptoms/concerns: Pt states that she is overwhelmed, does not know where to begin; having difficulty adjusting to new diabetes diagnosis is primary concern. Pt wants to put finances in order concerning healthcare, and is thinking about applying for disability. Primary symptoms are feeling stressed, changes in sleeping and eating, easily irritated, worrying about current situation.  Duration of problem: Over one week Severity: moderate  OBJECTIVE: Orientation & Cognition: Oriented x3. Thought processes normal and appropriate to situation. Mood: low, teary. Affect: appropriate Appearance: appropriate Risk of harm to self or others: no known risk of harm to self or others Substance use: polysubstance Assessments administered: PHQ9: 14/ GAD7: 13  Diagnosis: Adjustment reaction with anxiety and depression CPT Code: F43.23 -------------------------------------------- Other(s) present in the room: none  Time spent with patient in exam room: 20 minutes, 12:30-12:40pm   Depression screen PHQ 2/9  09/04/2015  Decreased Interest 1  Down, Depressed, Hopeless 2  PHQ - 2 Score 3  Altered sleeping 3  Tired, decreased energy 2  Change in appetite 3  Feeling bad or failure about yourself  1  Trouble concentrating 1  Moving slowly or fidgety/restless 1  Suicidal thoughts 0  PHQ-9 Score 14    GAD 7 : Generalized Anxiety Score 09/04/2015  Nervous, Anxious, on Edge 1  Control/stop worrying 2  Worry too much - different things 2  Trouble relaxing 3  Restless 1  Easily annoyed or irritable 3  Afraid - awful might happen 1  Total GAD 7 Score 13

## 2015-09-04 NOTE — Progress Notes (Signed)
Subjective:  Patient ID: Nancy [name], female    DOB: 1966/01/31  Age: 50 y.o. MRN: [id number]  CC: Hospitalization Follow-up   HPI Nancy [name] is a 50 year old female with a history of polysubstance abuse recently seen at [facility] ED for right hip pain; imaging revealed age advanced osteophytic changes of both hips worse on the right. She has completed a course of prednisone and hydrocodone and remains on ibuprofen which helps her symptoms; she currently ambulates with the aid of crutches. Urine had revealed glycosuria and a UTI and she was referred to the clinic here to be evaluated for diabetes.  A1c in the clinic reveals a new diagnosis of type 2 diabetes mellitus with A1c of 8.6 and she is overwhelmed. She still has some polydipsia and polyuria. She is currently on antibiotics for UTI and is requesting a prescription for Diflucan because she has a yeast infection from antibiotic use. Also complains of being depressed due to multifocal psychosocial factors including lack of food, lack of health insurance and is asking to see the Child psychotherapist.  Outpatient Prescriptions Prior to Visit  Medication Sig Dispense Refill  . albuterol (PROVENTIL HFA;VENTOLIN HFA) 108 (90 BASE) MCG/ACT inhaler Inhale 2 puffs into the lungs every 6 (six) hours as needed for wheezing or shortness of breath.    . cephALEXin (KEFLEX) 500 MG capsule Take 1 capsule (500 mg total) by mouth 4 (four) times daily. 28 capsule 0  . ibuprofen (ADVIL,MOTRIN) 800 MG tablet Take 1 tablet (800 mg total) by mouth 3 (three) times daily. 60 tablet 0  . predniSONE (DELTASONE) 20 MG tablet Take 2 tablets (40 mg total) by mouth daily. Take 40 mg by mouth daily for 3 days, then  by mouth daily for 3 days, then  daily for 3 days 12 tablet 0  . cyclobenzaprine (FLEXERIL) 10 MG tablet Take 1 tablet (10 mg total) by mouth 2 (two) times daily as needed for muscle spasms. (Patient not taking: Reported on 09/04/2015) 20 tablet 0    . HYDROcodone-acetaminophen (NORCO/VICODIN) 5-325 MG tablet Take 2 tablets by mouth every 4 (four) hours as needed. (Patient not taking: Reported on 09/04/2015) 6 tablet 0   No facility-administered medications prior to visit.    ROS Review of Systems  Constitutional: Negative for activity change, appetite change and fatigue.  HENT: Negative for congestion, sinus pressure and sore throat.   Eyes: Negative for visual disturbance.  Respiratory: Negative for cough, chest tightness, shortness of breath and wheezing.   Cardiovascular: Negative for chest pain and palpitations.  Gastrointestinal: Negative for abdominal pain, constipation and abdominal distention.  Endocrine: Negative for polydipsia.  Genitourinary: Negative for dysuria and frequency.  Musculoskeletal: Positive for back pain.       See history of present illness  Skin: Negative for rash.  Neurological: Negative for tremors, light-headedness and numbness.  Hematological: Does not bruise/bleed easily.  Psychiatric/Behavioral: Negative for behavioral problems and agitation.    Objective:  BP 136/83 mmHg  Pulse 91  Temp(Src) 98.1 F (36.7 C)  Resp 15  Ht  (1.727 m)  Wt 205 lb 12.8 oz (93.35 kg)  BMI 31.30 kg/m2  SpO2 96%  LMP 08/07/2015 (Approximate)  BP/Weight 09/04/2015 08/27/2015 06/21/2014  Systolic BP 136 125 114  Diastolic BP 83 88 57  Wt. (Lbs) 205.8 - -  BMI 31.3 - -      Physical Exam  Constitutional: She is oriented to person, place, and time. She appears well-developed and well-nourished.  Cardiovascular: Normal rate, normal heart sounds and intact distal pulses.   No murmur heard. Pulmonary/Chest: Effort normal and breath sounds normal. She has no wheezes. She has no rales. She exhibits no tenderness.  Abdominal: Soft. Bowel sounds are normal. She exhibits no distension and no mass. There is no tenderness.  Musculoskeletal: She exhibits tenderness (Tenderness on range of motion of the right hip and  right knee).  Neurological: She is alert and oriented to person, place, and time.  Psychiatric:  Patient discussed is teary    CMP Latest Ref Rng 06/20/2014 12/05/2013 12/04/2013  Glucose 70 - 99 mg/dL 811(B) - 147(W)  BUN 6 - 23 mg/dL 9 - 9  Creatinine 2.95 - 1.10 mg/dL 6.21 - 3.08  Sodium 657 - 145 mmol/L 142 - 139  Potassium 3.5 - 5.1 mmol/L 3.1(L) - 3.8  Chloride 96 - 112 mEq/L 107 - 103  CO2 19 - 32 mmol/L 23 - -  Calcium 8.4 - 10.5 mg/dL 8.8 - -  Total Protein 6.0 - 8.3 g/dL 7.9 6.7 -  Total Bilirubin 0.3 - 1.2 mg/dL 0.5 0.4 -  Alkaline Phos 39 - 117 U/L 83 53 -  AST 0 - 37 U/L 42(H) 15 -  ALT 0 - 35 U/L 39(H) 16 -     Lab Results  Component Value Date   HGBA1C 8.6 09/04/2015    Assessment & Plan:   1. Type 2 diabetes mellitus with hyperglycemia, without long-term current use of insulin (HCC) Newly diagnosed with A1c of 8.6 Patient is overwhelmed with new diagnosis at this time so I would discuss diabetic healthcare maintenance at her next office visit. Scheduled to see the clinical pharmacist for diabetic teaching. Keep blood sugar logs with fasting goals of 80-120 mg/dl, random of less than 846 and in the event of sugars less than 60 mg/dl or greater than 962 mg/dl please notify the clinic ASAP. It is recommended that you undergo annual eye exams and annual foot exams. Pneumovax is recommended every 5 years before the age of 11 and once for a lifetime at or after the age of 15. - HgB A1c - Glucose (CBG) - metFORMIN (GLUCOPHAGE) 500 MG tablet; 1 tablet (500 mg) orally twice daily for 1 week and then 2 tablets (1000 mg) twice daily  Dispense: 120 tablet; Refill: 3 - glucose blood (TRUE METRIX BLOOD GLUCOSE TEST) test strip; Use 3 times daily before meals.  Dispense: 100 each; Refill: 12 - Blood Glucose Monitoring Suppl (TRUE METRIX METER) DEVI; 1 each by Does not apply route 3 (three) times daily before meals.  Dispense: 1 Device; Refill: 0 - TRUEPLUS LANCETS 28G MISC; 1  each by Does not apply route 3 (three) times daily before meals.  Dispense: 100 each; Refill: 12  2. Polysubstance abuse Patient completed rehabilitation at caring services but continues to use cocaine and marijuana. Conselled on cessation  3. Depression LCSW called in to see the patient for a therapy session. May need initiation of antidepressants if not responding to therapy.  4. Primary osteoarthritis of right hip Currently on NSAIDs. I have explained to the patient that given severity of symptoms she may need evaluation for cold for possible hip replacement. Advised to work on [employer] application to facilitate referral process.   Meds ordered this encounter  Medications  . metFORMIN (GLUCOPHAGE) 500 MG tablet    Sig: 1 tablet (500 mg) orally twice daily for 1 week and then 2 tablets (1000 mg) twice daily  Dispense:  120 tablet    Refill:  3  . glucose blood (TRUE METRIX BLOOD GLUCOSE TEST) test strip    Sig: Use 3 times daily before meals.    Dispense:  100 each    Refill:  12  . Blood Glucose Monitoring Suppl (TRUE METRIX METER) DEVI    Sig: 1 each by Does not apply route 3 (three) times daily before meals.    Dispense:  1 Device    Refill:  0  . TRUEPLUS LANCETS 28G MISC    Sig: 1 each by Does not apply route 3 (three) times daily before meals.    Dispense:  100 each    Refill:  12  . fluconazole (DIFLUCAN) 150 MG tablet    Sig: Take 1 tablet (150 mg total) by mouth once.    Dispense:  1 tablet    Refill:  0    Follow-up: Return in about 3 weeks (around 09/25/2015) for Follow-up on diabetes mellitus; schedule with Misty StanleyStacey for diabetic teaching.   Jaclyn ShaggyEnobong Amao MD

## 2015-09-05 MED FILL — TRUE METRIX TEST STRIP: 30 days supply | Qty: 100 | Fill #0

## 2015-09-05 MED FILL — TRUEplus LANCETS 28G MISC: 30 days supply | Qty: 100 | Fill #0

## 2015-09-05 MED FILL — metFORMIN HCL 500 MG TABS: 500 | 30 days supply | Qty: 120 | Fill #0

## 2015-09-05 MED FILL — TRUE METRIX BLOOD GLUCOSE M: W/DEVICE | 1 days supply | Qty: 1 | Fill #0

## 2015-09-05 MED FILL — FLUCONAZOLE 150 MG TABLET: 150 | 1 days supply | Qty: 1 | Fill #0

## 2015-09-10 ENCOUNTER — Ambulatory Visit: Payer: Self-pay | Admitting: Pharmacist

## 2015-09-12 ENCOUNTER — Ambulatory Visit: Payer: Self-pay

## 2015-09-24 ENCOUNTER — Ambulatory Visit: Payer: Self-pay | Admitting: Pharmacist

## 2015-09-29 ENCOUNTER — Ambulatory Visit: Payer: Self-pay | Admitting: Family Medicine

## 2015-09-30 ENCOUNTER — Ambulatory Visit: Payer: Self-pay | Admitting: Pharmacist

## 2015-10-06 ENCOUNTER — Telehealth: Payer: Self-pay | Admitting: Clinical

## 2015-10-06 NOTE — Telephone Encounter (Signed)
Attempt to f/u with pt; left HIPPA-compliant message to call Sheridan Gettel at [employer] at [phone] 

## 2015-10-17 ENCOUNTER — Ambulatory Visit: Payer: Self-pay

## 2015-10-29 ENCOUNTER — Ambulatory Visit: Payer: Self-pay | Admitting: Pharmacist

## 2015-10-29 ENCOUNTER — Ambulatory Visit: Payer: Self-pay | Attending: Family Medicine | Admitting: Family Medicine

## 2015-10-29 ENCOUNTER — Encounter: Payer: Self-pay | Admitting: Family Medicine

## 2015-10-29 VITALS — BP 117/87 | HR 88 | Temp 98.0°F | Resp 16 | Ht 68.0 in | Wt 199.4 lb

## 2015-10-29 DIAGNOSIS — F418 Other specified anxiety disorders: Secondary | ICD-10-CM

## 2015-10-29 DIAGNOSIS — F419 Anxiety disorder, unspecified: Secondary | ICD-10-CM | POA: Insufficient documentation

## 2015-10-29 DIAGNOSIS — K219 Gastro-esophageal reflux disease without esophagitis: Secondary | ICD-10-CM | POA: Insufficient documentation

## 2015-10-29 DIAGNOSIS — E1165 Type 2 diabetes mellitus with hyperglycemia: Secondary | ICD-10-CM

## 2015-10-29 DIAGNOSIS — M1611 Unilateral primary osteoarthritis, right hip: Secondary | ICD-10-CM

## 2015-10-29 DIAGNOSIS — F1910 Other psychoactive substance abuse, uncomplicated: Secondary | ICD-10-CM

## 2015-10-29 DIAGNOSIS — F329 Major depressive disorder, single episode, unspecified: Secondary | ICD-10-CM | POA: Insufficient documentation

## 2015-10-29 DIAGNOSIS — Z79899 Other long term (current) drug therapy: Secondary | ICD-10-CM | POA: Insufficient documentation

## 2015-10-29 LAB — COMPLETE METABOLIC PANEL WITH GFR
ALT: 52 U/L — ABNORMAL HIGH (ref 6–29)
AST: 27 U/L (ref 10–35)
Albumin: 4.3 g/dL (ref 3.6–5.1)
Alkaline Phosphatase: 75 U/L (ref 33–115)
BUN: 9 mg/dL (ref 7–25)
CO2: 25 mmol/L (ref 20–31)
Calcium: 9.4 mg/dL (ref 8.6–10.2)
Chloride: 103 mmol/L (ref 98–110)
Creat: 0.79 mg/dL (ref 0.50–1.10)
GFR, Est African American: >89 mL/min (ref >=60)
GFR, Est Non African American: 89 mL/min (ref >=60)
Glucose, Bld: 164 mg/dL — ABNORMAL HIGH (ref 65–99)
Potassium: 4.4 mmol/L (ref 3.5–5.3)
Sodium: 138 mmol/L (ref 135–146)
Total Bilirubin: 0.4 mg/dL (ref 0.2–1.2)
Total Protein: 7.3 g/dL (ref 6.1–8.1)

## 2015-10-29 LAB — GLUCOSE, POCT (MANUAL RESULT ENTRY): POC Glucose: 197 mg/dl — AB (ref 70–99)

## 2015-10-29 LAB — LIPID PANEL
Cholesterol: 200 mg/dL (ref 125–200)
HDL: 53 mg/dL (ref >=46)
LDL Cholesterol: 115 mg/dL (ref <130)
Total CHOL/HDL Ratio: 3.8 Ratio (ref <=5.0)
Triglycerides: 161 mg/dL — ABNORMAL HIGH (ref <150)
VLDL: 32 mg/dL — ABNORMAL HIGH (ref <30)

## 2015-10-29 MED ORDER — METFORMIN HCL 500 MG PO TABS
1000.0000 mg | ORAL_TABLET | Freq: Two times a day (BID) | ORAL | Status: DC
Start: 2015-10-29 — End: 2017-05-06

## 2015-10-29 MED ORDER — MELOXICAM 15 MG PO TABS: 15.0000 mg | ORAL_TABLET | Freq: Every day | ORAL | Status: DC

## 2015-10-29 MED ORDER — CYCLOBENZAPRINE HCL 10 MG PO TABS: 10.0000 mg | ORAL_TABLET | Freq: Two times a day (BID) | ORAL | Status: DC | PRN

## 2015-10-29 MED ORDER — FLUOXETINE HCL 20 MG PO TABS: 20.0000 mg | ORAL_TABLET | Freq: Every day | ORAL | Status: DC

## 2015-10-29 MED FILL — ?CYCLOBENZAPRINE 10 MG TABL: 10 | 30 days supply | Qty: 60 | Fill #0

## 2015-10-29 MED FILL — metFORMIN HCL 500 MG TABS: 500 | 30 days supply | Qty: 120 | Fill #0

## 2015-10-29 MED FILL — MELOXICAM 15 MG TABLET: 15 | 30 days supply | Qty: 30 | Fill #0

## 2015-10-29 MED FILL — FLUoxetine HCL 20 MG CAPS: 20 | 30 days supply | Qty: 30 | Fill #0

## 2015-10-29 NOTE — Progress Notes (Signed)
Patient's here for 3wks f/up DM.  Patient c/o pain in her hips 12/10 pain is unbearable causing patient agony and distress and  anxiety.  Requesting med refills.  Patient requesting a Doctors note stating that she can't work.

## 2015-10-29 NOTE — Patient Instructions (Signed)
Diabetes Mellitus and Food It is important for you to manage your blood sugar (glucose) level. Your blood glucose level can be greatly affected by what you eat. Eating healthier foods in the appropriate amounts throughout the day at about the same time each day will help you control your blood glucose level. It can also help slow or prevent worsening of your diabetes mellitus. Healthy eating may even help you improve the level of your blood pressure and reach or maintain a healthy weight.  General recommendations for healthful eating and cooking habits include:  Eating meals and snacks regularly. Avoid going long periods of time without eating to lose weight.  Eating a diet that consists mainly of plant-based foods, such as fruits, vegetables, nuts, legumes, and whole grains.  Using low-heat cooking methods, such as baking, instead of high-heat cooking methods, such as deep frying. Work with your dietitian to make sure you understand how to use the Nutrition Facts information on food labels. HOW CAN FOOD AFFECT ME? Carbohydrates Carbohydrates affect your blood glucose level more than any other type of food. Your dietitian will help you determine how many carbohydrates to eat at each meal and teach you how to count carbohydrates. Counting carbohydrates is important to keep your blood glucose at a healthy level, especially if you are using insulin or taking certain medicines for diabetes mellitus. Alcohol Alcohol can cause sudden decreases in blood glucose (hypoglycemia), especially if you use insulin or take certain medicines for diabetes mellitus. Hypoglycemia can be a life-threatening condition. Symptoms of hypoglycemia (sleepiness, dizziness, and disorientation) are similar to symptoms of having too much alcohol.  If your health care provider has given you approval to drink alcohol, do so in moderation and use the following guidelines:  Women should not have more than one drink per day, and men  should not have more than two drinks per day. One drink is equal to:  12 oz of beer.  5 oz of wine.  1 oz of hard liquor.  Do not drink on an empty stomach.  Keep yourself hydrated. Have water, diet soda, or unsweetened iced tea.  Regular soda, juice, and other mixers might contain a lot of carbohydrates and should be counted. WHAT FOODS ARE NOT RECOMMENDED? As you make food choices, it is important to remember that all foods are not the same. Some foods have fewer nutrients per serving than other foods, even though they might have the same number of calories or carbohydrates. It is difficult to get your body what it needs when you eat foods with fewer nutrients. Examples of foods that you should avoid that are high in calories and carbohydrates but low in nutrients include:  Trans fats (most processed foods list trans fats on the Nutrition Facts label).  Regular soda.  Juice.  Candy.  Sweets, such as cake, pie, doughnuts, and cookies.  Fried foods. WHAT FOODS CAN I EAT? Eat nutrient-rich foods, which will nourish your body and keep you healthy. The food you should eat also will depend on several factors, including:  The calories you need.  The medicines you take.  Your weight.  Your blood glucose level.  Your blood pressure level.  Your cholesterol level. You should eat a variety of foods, including:  Protein.  Lean cuts of meat.  Proteins low in saturated fats, such as fish, egg whites, and beans. Avoid processed meats.  Fruits and vegetables.  Fruits and vegetables that may help control blood glucose levels, such as apples, mangoes, and   yams.  Dairy products.  Choose fat-free or low-fat dairy products, such as milk, yogurt, and cheese.  Grains, bread, pasta, and rice.  Choose whole grain products, such as multigrain bread, whole oats, and brown rice. These foods may help control blood pressure.  Fats.  Foods containing healthful fats, such as nuts,  avocado, olive oil, canola oil, and fish. DOES EVERYONE WITH DIABETES MELLITUS HAVE THE SAME MEAL PLAN? Because every person with diabetes mellitus is different, there is not one meal plan that works for everyone. It is very important that you meet with a dietitian who will help you create a meal plan that is just right for you.   This information is not intended to replace advice given to you by your health care provider. Make sure you discuss any questions you have with your health care provider.   Document Released: 03/04/2005 Document Revised: 06/28/2014 Document Reviewed: 05/04/2013 Elsevier Interactive Patient Education 2016 Elsevier Inc.  

## 2015-10-29 NOTE — Progress Notes (Signed)
Subjective:    Patient ID: Nancy [name], female    DOB: 02/18/1966, 50 y.o.   MRN: [id number]  HPI She is a 50 year old female with a history of Type 2 diabetes mellitus A1c 8.6, polysubstance abuse, bilateral hip pain with most recent hip x-rays revealing age advanced osteophytic changes of both hips worse on the right.  She remains on ibuprofen for pain which does not help much and we had discussed referral to orthopedics at her last office visit but she is yet to [facility] to facilitate this process; she currently ambulates with the aid of crutches. She is requesting a letter stating she is unable to work to enable her to obtain food stamps.  With regards to her diabetes she has been out of her medications as she informs me the pharmacy would not give her any more because she owes $32 and has to pay that prior to getting new refills.  She complains of worsening anxiety and depression due to her medical condition; was seen by the LCSW at her last office visit for counseling session but now she is willing to go on medications. Denies suicidal ideations or intents.  Past Medical History  Diagnosis Date  . Abnormal Pap smear   . GERD (gastroesophageal reflux disease)     tums prn  . Headache(784.0)   . Bronchitis   . Depression   . Polysubstance abuse     Past Surgical History  Procedure Laterality Date  . Dental surgery    . Cervical conization w/bx  12/13/2011    Procedure: CONIZATION CERVIX WITH BIOPSY;  Surgeon: Allie BossierMyra C Dove, MD;  Location: [facility];  Service: Gynecology;  Laterality: N/A;    No Known Allergies  Current Outpatient Prescriptions on File Prior to Visit  Medication Sig Dispense Refill  . albuterol (PROVENTIL HFA;VENTOLIN HFA) 108 (90 BASE) MCG/ACT inhaler Inhale 2 puffs into the lungs every 6 (six) hours as needed for wheezing or shortness of breath.    . Blood Glucose Monitoring Suppl (TRUE METRIX METER) DEVI 1 each by Does not apply route  3 (three) times daily before meals. 1 Device 0  . fluconazole (DIFLUCAN) 150 MG tablet Take 1 tablet (150 mg total) by mouth once. 1 tablet 0  . glucose blood (TRUE METRIX BLOOD GLUCOSE TEST) test strip Use 3 times daily before meals. 100 each 12  . TRUEPLUS LANCETS 28G MISC 1 each by Does not apply route 3 (three) times daily before meals. 100 each 12   No current facility-administered medications on file prior to visit.      Review of Systems Constitutional: Negative for activity change, appetite change and fatigue.  HENT: Negative for congestion, sinus pressure and sore throat.   Eyes: Negative for visual disturbance.  Respiratory: Negative for cough, chest tightness, shortness of breath and wheezing.   Cardiovascular: Negative for chest pain and palpitations.  Gastrointestinal: Negative for abdominal pain, constipation and abdominal distention.  Endocrine: Negative for polydipsia.  Genitourinary: Negative for dysuria and frequency.  Musculoskeletal:      See history of present illness  Skin: Negative for rash.  Neurological: Negative for tremors, light-headedness and numbness.  Hematological: Does not bruise/bleed easily.  Psychiatric/Behavioral: Positive for depression and anxiety, negative for suicidal intent or ideations.     Objective: Filed Vitals:   10/29/15 1203 10/29/15 1204  BP: 117/87 117/87  Pulse: 88 88  Temp: 98 F (36.7 C) 98 F (36.7 C)  TempSrc: Oral Oral  Resp: 16 16  Height:  (1.727 m)   Weight: 199 lb 6.4 oz (90.447 kg)   SpO2: 96% 96%      Physical Exam Constitutional: She is oriented to person, place, and time. She appears well-developed and well-nourished.  Cardiovascular: Normal rate, normal heart sounds and intact distal pulses.   No murmur heard. Pulmonary/Chest: Effort normal and breath sounds normal. She has no wheezes. She has no rales. She exhibits no tenderness.  Abdominal: Soft. Bowel sounds are normal. She exhibits no distension  and no mass. There is no tenderness.  Musculoskeletal: She exhibits tenderness (Tenderness on range of motion of the right hip and right knee).  Neurological: She is alert and oriented to person, place, and time.  Psychiatric: She is very teary   CLINICAL DATA: Intermittent right hip and leg pain for 1 year, worsening over the last 3 weeks. No related acute injury.  EXAM: DG HIP (WITH OR WITHOUT PELVIS) 2-3V RIGHT  COMPARISON: None.  FINDINGS: The mineralization and alignment are normal. There is no evidence of acute fracture, dislocation or femoral head avascular necrosis. There are osteoarthritic changes of both hips which are worse on the right. The sacroiliac joints appear normal. There are mild degenerative changes in the lower lumbar spine.  IMPRESSION: Age advanced osteoarthritic changes of both hips, worse on the right. No acute osseous findings.   Electronically Signed  By: Carey Bullocks M.D.  On: 08/27/2015 16:10     Assessment & Plan:  1. Type 2 diabetes mellitus with hyperglycemia, without long-term current use of insulin (HCC) Uncontrolled with A1c of 8.6, she has been noncompliant with metformin Compliance emphasized as well as ADA diet healthcare maintenance at her next office visit. Keep blood sugar logs with fasting goals of 80-120 mg/dl, random of less than 161 and in the event of sugars less than 60 mg/dl or greater than 096 mg/dl please notify the clinic ASAP. It is recommended that you undergo annual eye exams and annual foot exams. Pneumovax is recommended every 5 years before the age of 61 and once for a lifetime at or after the age of 50. - Glucose (CBG) - metFORMIN (GLUCOPHAGE) 500 MG tablet; 1 tablet (500 mg) orally twice daily for 1 week and then 2 tablets (1000 mg) twice daily  Dispense: 120 tablet; Refill: 3 - glucose blood (TRUE METRIX BLOOD GLUCOSE TEST) test strip; Use 3 times daily before meals.  Dispense: 100 each; Refill: 12 -  Blood Glucose Monitoring Suppl (TRUE METRIX METER) DEVI; 1 each by Does not apply route 3 (three) times daily before meals.  Dispense: 1 Device; Refill: 0 - TRUEPLUS LANCETS 28G MISC; 1 each by Does not apply route 3 (three) times daily before meals.  Dispense: 100 each; Refill: 12  2. Polysubstance abuse Patient completed rehabilitation at [facility] but continues to use cocaine and marijuana. Conselled on cessation  3. Depression and anxiety Had a counseling session with daily LCSW at her last visit Will commence on Prozac  4. Primary osteoarthritis of right hip Substituted ibuprofen with meloxicam I have explained to the patient that given severity of symptoms she may need evaluation for cold for possible hip replacement. Advised to work on [employer] application to facilitate referral process.  This note has been created with Education officer, environmental. Any transcriptional errors are unintentional.

## 2015-10-30 ENCOUNTER — Other Ambulatory Visit: Payer: Self-pay | Admitting: Family Medicine

## 2015-10-30 DIAGNOSIS — E785 Hyperlipidemia, unspecified: Secondary | ICD-10-CM | POA: Insufficient documentation

## 2015-10-30 LAB — MICROALBUMIN / CREATININE URINE RATIO
Creatinine, Urine: 100 mg/dL (ref 20–320)
Microalb Creat Ratio: 10 mcg/mg creat (ref <30)
Microalb, Ur: 1 mg/dL

## 2015-10-30 MED ORDER — ATORVASTATIN CALCIUM 20 MG PO TABS: 20.0000 mg | ORAL_TABLET | Freq: Every day | ORAL | Status: DC

## 2015-10-30 MED FILL — ATORVASTATIN 20 MG TABLET: 20 | 30 days supply | Qty: 30 | Fill #0

## 2015-11-03 ENCOUNTER — Telehealth: Payer: Self-pay

## 2015-11-03 NOTE — Telephone Encounter (Signed)
-----   Message from Jaclyn ShaggyEnobong Amao, MD sent at 10/30/2015  9:15 AM EDT ----- Labs revealed mildly elevated cholesterol and given history of diabetes she is at increased risk of cardiovascular events and so I have placed her on atorvastatin.

## 2015-11-03 NOTE — Telephone Encounter (Signed)
Placed call to patient, patient verified name and DOB. Patient was given lab results.

## 2015-11-05 ENCOUNTER — Ambulatory Visit: Payer: Self-pay | Attending: Family Medicine | Admitting: Pharmacist

## 2015-11-05 ENCOUNTER — Ambulatory Visit: Payer: Self-pay

## 2015-11-05 ENCOUNTER — Encounter: Payer: Self-pay | Admitting: Pharmacist

## 2015-11-05 DIAGNOSIS — E1165 Type 2 diabetes mellitus with hyperglycemia: Secondary | ICD-10-CM

## 2015-11-05 NOTE — Progress Notes (Signed)
S:    Patient presents for diabetes evaluation, education, and management at the request of Dr. Venetia NightAmao. Patient was referred on 10/29/15.  Patient was last seen by Primary Care Provider on 10/29/15.   Patient reports adherence with medications.  Current diabetes medications include: metformin 1000 mg BID.   Patient denies hypoglycemic events.  Patient reported dietary habits: Eats 3 meals/day and will also wake up at 3AM to have a snack, such as ice cream. She will drink regular ginger ale throughout the day.   Patient reported exercise habits:    Patient reports nocturia.  Patient denies neuropathy. Patient denies visual changes. Patient reports self foot exams.   She reports that she used to use her blood glucose meter but that she stopped using it in March. She wants to review how to take it.    O:  Lab Results  Component Value Date   HGBA1C 8.6 09/04/2015    A/P: Type 2 diabetes currently uncontrolled based on A1c of 8.6. Patient denies hypoglycemic events and is able to verbalize appropriate hypoglycemia management plan. Patient reports adherence with medication. Control is suboptimal due to dietary indiscretion.  Continue metformin 1000 mg BID. Need home readings to really determine if further medication therapy is needed so will only recommended decreasing sugar intake at this time. Encouraged patient to check blood glucose fasting and a few post-prandial throughout the week. Also encouraged patient to change to diet ginger ale or water as the 2 L bottle of soda contributes to her elevated blood glucose. Reviewed use of the blood glucose meter with her, as well as s/sx of hypo and hyperglycemia, basic carbohydrate counting and the plate method, and factors that increase and decrease blood glucose (stress, pain, infection can increase blood glucose and diet and exercise can decrease blood glucose).   Next A1C anticipated June 2017.    Written patient instructions provided.  Total  time in face to face counseling 30 minutes.  Follow up in Pharmacist Clinic Visit PRN. Next visit with Dr. Venetia NightAmao.

## 2015-11-05 NOTE — Patient Instructions (Signed)
Thanks for coming to see me!  Start checking your blood sugar at least once a day when you wake up from your longest period of not eating.  Follow up with Dr. Venetia [name] next month.  Blood Glucose Monitoring, Adult Monitoring your blood glucose (also know as blood sugar) helps you to manage your diabetes. It also helps you and your health care provider monitor your diabetes and determine how well your treatment plan is working. WHY SHOULD YOU MONITOR YOUR BLOOD GLUCOSE?  It can help you understand how food, exercise, and medicine affect your blood glucose.  It allows you to know what your blood glucose is at any given moment. You can quickly tell if you are having low blood glucose (hypoglycemia) or high blood glucose (hyperglycemia).  It can help you and your health care provider know how to adjust your medicines.  It can help you understand how to manage an illness or adjust medicine for exercise. WHEN SHOULD YOU TEST? Your health care provider will help you decide how often you should check your blood glucose. This may depend on the type of diabetes you have, your diabetes control, or the types of medicines you are taking. Be sure to write down all of your blood glucose readings so that this information can be reviewed with your health care provider. See below for examples of testing times that your health care provider may suggest. Type 1 Diabetes  Test at least 2 times per day if your diabetes is well controlled, if you are using an insulin pump, or if you perform multiple daily injections.  If your diabetes is not well controlled or if you are sick, you may need to test more often.  It is a good idea to also test:  Before every insulin injection.  Before and after exercise.  Between meals and 2 hours after a meal.  Occasionally between 2:00 a.m. and 3:00 a.m. Type 2 Diabetes  If you are taking insulin, test at least 2 times per day. However, it is best to test before every insulin  injection.  If you take medicines by mouth (orally), test 2 times a day.  If you are on a controlled diet, test once a day.  If your diabetes is not well controlled or if you are sick, you may need to monitor more often. HOW TO MONITOR YOUR BLOOD GLUCOSE Supplies Needed  Blood glucose meter.  Test strips for your meter. Each meter has its own strips. You must use the strips that go with your own meter.  A pricking needle (lancet).  A device that holds the lancet (lancing device).  A journal or log book to write down your results. Procedure  Wash your hands with soap and water. Alcohol is not preferred.  Prick the side of your finger (not the tip) with the lancet.  Gently milk the finger until a small drop of blood appears.  Follow the instructions that come with your meter for inserting the test strip, applying blood to the strip, and using your blood glucose meter. Other Areas to Get Blood for Testing Some meters allow you to use other areas of your body (other than your finger) to test your blood. These areas are called alternative sites. The most common alternative sites are:  The forearm.  The thigh.  The back area of the lower leg.  The palm of the hand. The blood flow in these areas is slower. Therefore, the blood glucose values you get may be delayed, and  the numbers are different from what you would get from your fingers. Do not use alternative sites if you think you are having hypoglycemia. Your reading will not be accurate. Always use a finger if you are having hypoglycemia. Also, if you cannot feel your lows (hypoglycemia unawareness), always use your fingers for your blood glucose checks. ADDITIONAL TIPS FOR GLUCOSE MONITORING  Do not reuse lancets.  Always carry your supplies with you.  All blood glucose meters have a 24-hour "hotline" number to call if you have questions or need help.  Adjust (calibrate) your blood glucose meter with a control solution  after finishing a few boxes of strips. BLOOD GLUCOSE RECORD KEEPING It is a good idea to keep a daily record or log of your blood glucose readings. Most glucose meters, if not all, keep your glucose records stored in the meter. Some meters come with the ability to download your records to your home computer. Keeping a record of your blood glucose readings is especially helpful if you are wanting to look for patterns. Make notes to go along with the blood glucose readings because you might forget what happened at that exact time. Keeping good records helps you and your health care provider to work together to achieve good diabetes management.    This information is not intended to replace advice given to you by your health care provider. Make sure you discuss any questions you have with your health care provider.   Document Released: 06/10/2003 Document Revised: 06/28/2014 Document Reviewed: 10/30/2012 Elsevier Interactive Patient Education [employer].

## 2015-11-18 ENCOUNTER — Ambulatory Visit: Payer: Self-pay | Admitting: Family Medicine

## 2015-12-15 MED FILL — FLUoxetine HCL 20 MG CAPS: 20 | 30 days supply | Qty: 30 | Fill #1

## 2015-12-15 MED FILL — MELOXICAM 15 MG TABLET: 15 | 30 days supply | Qty: 30 | Fill #1

## 2015-12-15 MED FILL — ?CYCLOBENZAPRINE 10 MG TABL: 10 | 30 days supply | Qty: 60 | Fill #1

## 2015-12-15 MED FILL — ATORVASTATIN 20 MG TABLET: 20 | 30 days supply | Qty: 30 | Fill #1

## 2015-12-24 ENCOUNTER — Ambulatory Visit: Payer: Self-pay

## 2015-12-31 ENCOUNTER — Encounter: Payer: Self-pay | Admitting: Family Medicine

## 2015-12-31 ENCOUNTER — Ambulatory Visit: Payer: Self-pay | Attending: Family Medicine | Admitting: Family Medicine

## 2015-12-31 VITALS — BP 128/82 | HR 83 | Temp 97.2°F | Ht 68.0 in | Wt 195.2 lb

## 2015-12-31 DIAGNOSIS — J4520 Mild intermittent asthma, uncomplicated: Secondary | ICD-10-CM

## 2015-12-31 DIAGNOSIS — E119 Type 2 diabetes mellitus without complications: Secondary | ICD-10-CM

## 2015-12-31 DIAGNOSIS — B379 Candidiasis, unspecified: Secondary | ICD-10-CM

## 2015-12-31 DIAGNOSIS — J45909 Unspecified asthma, uncomplicated: Secondary | ICD-10-CM | POA: Insufficient documentation

## 2015-12-31 LAB — GLUCOSE, POCT (MANUAL RESULT ENTRY): POC Glucose: 173 mg/dl — AB (ref 70–99)

## 2015-12-31 LAB — POCT GLYCOSYLATED HEMOGLOBIN (HGB A1C): Hemoglobin A1C: 7

## 2015-12-31 MED ORDER — ALBUTEROL SULFATE HFA 108 (90 BASE) MCG/ACT IN AERS: 2.0000 | INHALATION_SPRAY | Freq: Four times a day (QID) | RESPIRATORY_TRACT | Status: DC | PRN

## 2015-12-31 MED ORDER — NYSTATIN 100000 UNIT/GM EX POWD: Freq: Three times a day (TID) | CUTANEOUS | Status: DC

## 2015-12-31 MED FILL — NYSTOP 100,000 UNITS/GM PWD: 100000 | 30 days supply | Qty: 45 | Fill #0

## 2015-12-31 MED FILL — VENTOLIN HFA 90 MCG INHALER: 108 (90 BAS | 20 days supply | Qty: 18 | Fill #0

## 2015-12-31 NOTE — Patient Instructions (Signed)
Cutaneous Candidiasis °Cutaneous candidiasis is a condition in which there is an overgrowth of yeast (candida) on the skin. Yeast normally live on the skin, but in small enough numbers not to cause any symptoms. In certain cases, increased growth of the yeast may cause an actual yeast infection. This kind of infection usually occurs in areas of the skin that are constantly warm and moist, such as the armpits or the groin. Yeast is the most common cause of diaper rash in babies and in people who cannot control their bowel movements (incontinence). °CAUSES  °The fungus that most often causes cutaneous candidiasis is Candida albicans. Conditions that can increase the risk of getting a yeast infection of the skin include: °· Obesity. °· Pregnancy. °· Diabetes. °· Taking antibiotic medicine. °· Taking birth control pills. °· Taking steroid medicines. °· Thyroid disease. °· An iron or zinc deficiency. °· Problems with the immune system. °SYMPTOMS  °· Red, swollen area of the skin. °· Bumps on the skin. °· Itchiness. °DIAGNOSIS  °The diagnosis of cutaneous candidiasis is usually based on its appearance. Light scrapings of the skin may also be taken and viewed under a microscope to identify the presence of yeast. °TREATMENT  °Antifungal creams may be applied to the infected skin. In severe cases, oral medicines may be needed.  °HOME CARE INSTRUCTIONS  °· Keep your skin clean and dry. °· Maintain a healthy weight. °· If you have diabetes, keep your blood sugar under control. °SEEK IMMEDIATE MEDICAL CARE IF: °· Your rash continues to spread despite treatment. °· You have a fever, chills, or abdominal pain. °  °This information is not intended to replace advice given to you by your health care provider. Make sure you discuss any questions you have with your health care provider. °  °Document Released: 02/23/2011 Document Revised: 08/30/2011 Document Reviewed: 12/09/2014 °Elsevier Interactive Patient Education ©2016 Elsevier  Inc. ° °

## 2015-12-31 NOTE — Progress Notes (Signed)
Subjective:  Patient ID: Nancy [name], female    DOB: 03/07/1966  Age: 50 y.o. MRN: [id number]  CC: Rash   HPI Nancy MussSally C Beougher is a 50 year old female with a history of type 2 diabetes mellitus (A1c 7), bilateral hip osteoarthritis who comes into the clinic complaining of a pruritic rash on the medial aspect of both thighs for the last few weeks. Denies fever and has been using over-the-counter creams with no relief.  She would also like to be referred to orthopedics for severe bilateral osteoarthritis of the hip and is working on having her [place] discounts renewed so she can be referred. Advised to make an appointment with Diane for this.  Outpatient Prescriptions Prior to Visit  Medication Sig Dispense Refill  . atorvastatin (LIPITOR) 20 MG tablet Take 1 tablet (20 mg total) by mouth daily. 30 tablet 3  . Blood Glucose Monitoring Suppl (TRUE METRIX METER) DEVI 1 each by Does not apply route 3 (three) times daily before meals. 1 Device 0  . cyclobenzaprine (FLEXERIL) 10 MG tablet Take 1 tablet (10 mg total) by mouth 2 (two) times daily as needed for muscle spasms. 60 tablet 1  . FLUoxetine (PROZAC) 20 MG tablet Take 1 tablet (20 mg total) by mouth daily. 30 tablet 3  . meloxicam (MOBIC) 15 MG tablet Take 1 tablet (15 mg total) by mouth daily. 30 tablet 3  . metFORMIN (GLUCOPHAGE) 500 MG tablet Take 2 tablets (1,000 mg total) by mouth 2 (two) times daily with a meal. 120 tablet 3  . TRUEPLUS LANCETS 28G MISC 1 each by Does not apply route 3 (three) times daily before meals. 100 each 12  . fluconazole (DIFLUCAN) 150 MG tablet Take 1 tablet (150 mg total) by mouth once. (Patient not taking: Reported on 12/31/2015) 1 tablet 0  . glucose blood (TRUE METRIX BLOOD GLUCOSE TEST) test strip Use 3 times daily before meals. (Patient not taking: Reported on 12/31/2015) 100 each 12  . albuterol (PROVENTIL HFA;VENTOLIN HFA) 108 (90 BASE) MCG/ACT inhaler Inhale 2 puffs into the lungs every 6 (six)  hours as needed for wheezing or shortness of breath. Reported on 12/31/2015     No facility-administered medications prior to visit.    ROS Review of Systems  Constitutional: Negative for activity change and appetite change.  HENT: Negative for sinus pressure and sore throat.   Respiratory: Negative for chest tightness, shortness of breath and wheezing.   Cardiovascular: Negative for chest pain and palpitations.  Gastrointestinal: Negative for abdominal pain, constipation and abdominal distention.  Genitourinary: Negative.   Musculoskeletal: Negative.   Skin:       See hpi  Psychiatric/Behavioral: Negative for behavioral problems and dysphoric mood.    Objective:  BP 128/82 mmHg  Pulse 83  Temp(Src) 97.2 F (36.2 C) (Oral)  Ht 5\' 8"  (1.727 m)  Wt 195 lb 3.2 oz (88.542 kg)  BMI 29.69 kg/m2  SpO2 97%  LMP 12/20/2015  BP/Weight 12/31/2015 10/29/2015 09/04/2015  Systolic BP 128 117 136  Diastolic BP 82 87 83  Wt. (Lbs) 195.2 199.4 205.8  BMI 29.69 30.33 31.3      Physical Exam  Constitutional: She is oriented to person, place, and time. She appears well-developed and well-nourished.  Cardiovascular: Normal rate, normal heart sounds and intact distal pulses.   No murmur heard. Pulmonary/Chest: Effort normal and breath sounds normal. She has no wheezes. She has no rales. She exhibits no tenderness.  Abdominal: Soft. Bowel sounds are normal. She  exhibits no distension and no mass. There is no tenderness.  Musculoskeletal: Normal range of motion.  Neurological: She is alert and oriented to person, place, and time.  Skin:  Erythematous rash in bilateral crural folds with satellite lesions     Assessment & Plan:   1. Type 2 diabetes mellitus without complication, without long-term current use of insulin (HCC) A1c controlled at 7 - POCT glucose (manual entry) - POCT glycosylated hemoglobin (Hb A1C)  2. Candida infection Advised to wear loose clothing - nystatin  (MYCOSTATIN/NYSTOP) powder; Apply topically 3 (three) times daily.  Dispense: 45 g; Refill: 1  3. Asthma, mild intermittent, uncomplicated Controlled - albuterol (PROVENTIL HFA;VENTOLIN HFA) 108 (90 Base) MCG/ACT inhaler; Inhale 2 puffs into the lungs every 6 (six) hours as needed for wheezing or shortness of breath. Reported on 12/31/2015  Dispense: 1 each; Refill: 3   Meds ordered this encounter  Medications  . albuterol (PROVENTIL HFA;VENTOLIN HFA) 108 (90 Base) MCG/ACT inhaler    Sig: Inhale 2 puffs into the lungs every 6 (six) hours as needed for wheezing or shortness of breath. Reported on 12/31/2015    Dispense:  1 each    Refill:  3  . nystatin (MYCOSTATIN/NYSTOP) powder    Sig: Apply topically 3 (three) times daily.    Dispense:  45 g    Refill:  1    Follow-up: Return in about 1 month (around 01/31/2016) for Diabetic visit.   Jaclyn Shaggy MD

## 2016-01-02 ENCOUNTER — Ambulatory Visit: Payer: Medicaid Other | Attending: Family Medicine

## 2016-01-02 ENCOUNTER — Telehealth: Payer: Self-pay | Admitting: Family Medicine

## 2016-01-02 DIAGNOSIS — M1611 Unilateral primary osteoarthritis, right hip: Secondary | ICD-10-CM

## 2016-01-02 NOTE — Telephone Encounter (Signed)
Pt. Came into facility to let her PCP know that she has been approved for 100% discount.

## 2016-01-05 ENCOUNTER — Ambulatory Visit: Payer: Self-pay

## 2016-01-05 NOTE — Telephone Encounter (Signed)
Referral placed.

## 2016-01-13 ENCOUNTER — Ambulatory Visit: Payer: Self-pay | Admitting: Family Medicine

## 2016-01-14 ENCOUNTER — Other Ambulatory Visit: Payer: Self-pay | Admitting: Family Medicine

## 2016-01-14 DIAGNOSIS — M1611 Unilateral primary osteoarthritis, right hip: Secondary | ICD-10-CM

## 2016-01-14 MED FILL — ?METFORMIN HCL 500MG TABLET: 500 | 30 days supply | Qty: 120 | Fill #1

## 2016-01-14 MED FILL — ATORVASTATIN 20 MG TABLET: 20 | 30 days supply | Qty: 30 | Fill #2

## 2016-01-14 MED FILL — FLUoxetine HCL 20 MG CAPS: 20 | 30 days supply | Qty: 30 | Fill #2

## 2016-01-14 MED FILL — MELOXICAM 15 MG TABLET: 15 | 30 days supply | Qty: 30 | Fill #2

## 2016-01-14 NOTE — Telephone Encounter (Signed)
Rx Request 

## 2016-01-15 MED FILL — ?CYCLOBENZAPRINE 10 MG TABL: 10 | 30 days supply | Qty: 60 | Fill #0

## 2016-02-10 ENCOUNTER — Encounter: Payer: Self-pay | Admitting: Family Medicine

## 2016-02-10 ENCOUNTER — Ambulatory Visit (INDEPENDENT_AMBULATORY_CARE_PROVIDER_SITE_OTHER): Payer: Self-pay | Admitting: Family Medicine

## 2016-02-10 VITALS — BP 116/83 | Ht 69.0 in | Wt 180.0 lb

## 2016-02-10 DIAGNOSIS — M1611 Unilateral primary osteoarthritis, right hip: Secondary | ICD-10-CM

## 2016-02-10 DIAGNOSIS — M1612 Unilateral primary osteoarthritis, left hip: Secondary | ICD-10-CM | POA: Insufficient documentation

## 2016-02-10 NOTE — Assessment & Plan Note (Signed)
Review of x-ray show bone-on-bone of her left hip as well. She is having less pain in the left hip than the right. She is crying taken mobic and Flexeril. - Referral has been placed to orthopedics.

## 2016-02-10 NOTE — Assessment & Plan Note (Signed)
Right hip is giving her more pain in left hip. After reviewing her x-rays she would most likely not benefit from intra-articular ultrasound-guided hip injections. She most likely benefit from a hip replacement. - Referral to orthopedics - Follow-up as needed.

## 2016-02-10 NOTE — Patient Instructions (Addendum)
[facility] Dr Doneen Poissonhristopher Blackman Wednesday September 13th at 1:45pm [address], [place], [place] [address] Phone: [phone]

## 2016-02-10 NOTE — Progress Notes (Signed)
  Nancy [name] - 50 y.o. female MRN [id number]  Date of birth: 01/20/1966  SUBJECTIVE:  Including CC & ROS.  Chief Complaint  Patient presents with  . Hip Pain    [name] is a 50 year old female that is presenting with bilateral hip pain. Her right hip is worse than her left. The pain is acute on chronic and has been present for over 2 years. She is having pain in the front of her thigh and pain at night. She describes the pain as severe and constantly throbbing in nature. The pain is worse with walking. She hasn't found anything to improve her pain. Has taken mobic with limited help. He reports having numbness in her feet.  ROS: No unexpected weight loss, fever, chills, swelling, instability, redness, otherwise see HPI    HISTORY: Past Medical, Surgical, Social, and Family History Reviewed & Updated per EMR.   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  FHx -  dm2  Medications - lipitor, flexeril, metformin, prozac, mobic  DATA REVIEWED: 08/27/15: Right hip x-ray: advanced OA changes of both hips with right worse than left.  06/21/14: right femur x-ray. Imaging showing moderate arthritic changes within her right hip.  PHYSICAL EXAM:  VS: BP:116/83  HR: bpm  TEMP: ( )  RESP:   HT:5\' 9"  (175.3 cm)   WT:180 lb (81.6 kg)  BMI:26.6 PHYSICAL EXAM: Gen: NAD, alert, cooperative with exam, well-appearing HEENT: clear conjunctiva, EOMI CV:  no edema, capillary refill brisk,  Resp: non-labored, normal speech Skin: no rashes, normal turgor  Neuro: no gross deficits.  Psych:  alert and oriented Hip: Ambulating with one crutch for the right side. No tenderness to palpation of the greater trochanter bilaterally. Normal knee extension. Normal strength with hip flexion. Normal plantar and dorsiflexion. Normal pulses. Normal sensation distally. Limited internal  rotation to less than 10 bilaterally. Limited external rotation bilaterally.  ASSESSMENT & PLAN:   Osteoarthritis of right hip Right hip is giving her more pain in left hip. After reviewing her x-rays she would most likely not benefit from intra-articular ultrasound-guided hip injections. She most likely benefit from a hip replacement. - Referral to orthopedics - Follow-up as needed.    Primary osteoarthritis of left hip Review of x-ray show bone-on-bone of her left hip as well. She is having less pain in the left hip than the right. She is crying taken mobic and Flexeril. - Referral has been placed to orthopedics.

## 2016-02-12 MED FILL — ?ATORVASTATIN 20 MG TABLET: 20 | 30 days supply | Qty: 30 | Fill #3

## 2016-02-12 MED FILL — MELOXICAM 15 MG TABLET: 15 | 30 days supply | Qty: 30 | Fill #3

## 2016-02-12 MED FILL — CYCLOBENZAPRINE 10 MG TAB: 10 | 30 days supply | Qty: 60 | Fill #1

## 2016-02-12 MED FILL — FLUoxetine HCL 20 MG CAPS: 20 | 30 days supply | Qty: 30 | Fill #3

## 2016-02-12 MED FILL — ?METFORMIN HCL 500MG TABLET: 500 | 30 days supply | Qty: 120 | Fill #2

## 2016-03-21 ENCOUNTER — Other Ambulatory Visit: Payer: Self-pay | Admitting: Orthopaedic Surgery

## 2016-03-25 ENCOUNTER — Other Ambulatory Visit: Payer: Self-pay | Admitting: Family Medicine

## 2016-03-25 DIAGNOSIS — F418 Other specified anxiety disorders: Secondary | ICD-10-CM

## 2016-03-25 DIAGNOSIS — E785 Hyperlipidemia, unspecified: Secondary | ICD-10-CM

## 2016-03-25 DIAGNOSIS — M1611 Unilateral primary osteoarthritis, right hip: Secondary | ICD-10-CM

## 2016-03-25 MED FILL — TRUEplus LANCETS 28G MISC: 30 days supply | Qty: 100 | Fill #1

## 2016-03-25 MED FILL — ATORVASTATIN 20 MG TABLET: 20 | 30 days supply | Qty: 30 | Fill #0

## 2016-03-25 MED FILL — NYSTOP 100,000 UNITS/GM PWD: 100000 | 30 days supply | Qty: 45 | Fill #1

## 2016-03-25 MED FILL — TRUE METRIX TEST STRIP: 30 days supply | Qty: 100 | Fill #1

## 2016-03-25 MED FILL — metFORMIN HCL 500 MG TABS: 500 | 30 days supply | Qty: 120 | Fill #3

## 2016-03-25 MED FILL — MELOXICAM 15 MG TABLET: 15 | 30 days supply | Qty: 30 | Fill #0

## 2016-03-25 MED FILL — CYCLOBENZAPRINE 10 MG TAB: 10 | 30 days supply | Qty: 60 | Fill #0

## 2016-03-25 MED FILL — FLUoxetine HCL 20 MG CAPS: 20 | 30 days supply | Qty: 30 | Fill #0

## 2016-03-25 NOTE — Pre-Procedure Instructions (Signed)
Nancy [name]  03/25/2016     Your procedure is scheduled on : Tuesday March 30, 2016 at 12:10 PM.  Report to [facility] Admitting at 10:10 AM.  Call this number if you have problems the morning of surgery: [phone]    Remember:  Do not eat food or drink liquids after midnight.  Take these medicines the morning of surgery with A SIP OF WATER : Albuterol inhaler if needed (bring inhaler with you), Fluoxetine (Prozac)   Stop taking any vitamins, herbal medications/supplements, NSAIDs, Ibuprofen, Advil, Motrin, Aleve, Meloxicam/Mobic, etc today   Do NOT take any diabetic pills the morning of your surgery (NO Metformin/Glucophage)     How to Manage Your Diabetes Before and After Surgery  Why is it important to control my blood sugar before and after surgery? . Improving blood sugar levels before and after surgery helps healing and can limit problems. . A way of improving blood sugar control is eating a healthy diet by: o  Eating less sugar and carbohydrates o  Increasing activity/exercise o  Talking with your doctor about reaching your blood sugar goals . High blood sugars (greater than 180 mg/dL) can raise your risk of infections and slow your recovery, so you will need to focus on controlling your diabetes during the weeks before surgery. . Make sure that the doctor who takes care of your diabetes knows about your planned surgery including the date and location.  How do I manage my blood sugar before surgery? . Check your blood sugar at least 4 times a day, starting 2 days before surgery, to make sure that the level is not too high or low. o Check your blood sugar the morning of your surgery when you wake up and every 2 hours until you get to the Short Stay unit. . If your blood sugar is less than 70 mg/dL, you will need to treat for low blood sugar: o Do not take insulin. o Treat a low blood sugar (less than 70 mg/dL) with  cup of clear juice (cranberry or  apple), 4 glucose tablets, OR glucose gel. o Recheck blood sugar in 15 minutes after treatment (to make sure it is greater than 70 mg/dL). If your blood sugar is not greater than 70 mg/dL on recheck, call [phone] for further instructions. . Report your blood sugar to the short stay nurse when you get to Short Stay.  . If you are admitted to the hospital after surgery: o Your blood sugar will be checked by the staff and you will probably be given insulin after surgery (instead of oral diabetes medicines) to make sure you have good blood sugar levels. o The goal for blood sugar control after surgery is 80-180 mg/dL.     WHAT DO I DO ABOUT MY DIABETES MEDICATION?   Marland Kitchen Do not take oral diabetes medicines (pills) the morning of surgery.   Reviewed and Endorsed by [facility] Patient Education Committee, August 2015   Do not wear jewelry, make-up or nail polish.  Do not wear lotions, powders, or perfumes, or deoderant.  Do not shave 48 hours prior to surgery.    Do not bring valuables to the hospital.  [facility] is not responsible for any belongings or valuables.  Contacts, dentures or bridgework may not be worn into surgery.  Leave your suitcase in the car.  After surgery it may be brought to your room.  For patients admitted to the hospital, discharge time will be determined by  your treatment team.  Patients discharged the day of surgery will not be allowed to drive home.   Name and phone number of your driver:    Special instructions:  Shower using CHG soap the night before and the morning of your surgery  Please read over the following fact sheets that you were given. Pain Booklet, Total Joint Packet and MRSA Information

## 2016-03-26 ENCOUNTER — Inpatient Hospital Stay (HOSPITAL_COMMUNITY)
Admission: RE | Admit: 2016-03-26 | Discharge: 2016-03-26 | Disposition: A | Discharge status: Home / Self Care | Payer: Self-pay | Source: Ambulatory Visit

## 2016-03-29 ENCOUNTER — Encounter (HOSPITAL_COMMUNITY): Payer: Self-pay | Admitting: Vascular Surgery

## 2016-03-29 MED ORDER — TRANEXAMIC ACID 1000 MG/10ML IV SOLN
1000.0000 mg | INTRAVENOUS | Status: AC
  Filled 2016-03-29: qty 10

## 2016-03-29 MED ORDER — TRANEXAMIC ACID 1000 MG/10ML IV SOLN
1000.0000 mg | INTRAVENOUS | Status: DC
  Filled 2016-03-29: qty 10

## 2016-04-05 ENCOUNTER — Encounter (HOSPITAL_COMMUNITY): Payer: Self-pay | Admitting: *Deleted

## 2016-04-05 MED ORDER — CEFAZOLIN SODIUM-DEXTROSE 2-4 GM/100ML-% IV SOLN: 2.0000 g | INTRAVENOUS | Status: AC

## 2016-04-05 NOTE — Progress Notes (Signed)
Unable to reach pt by phone for pre-op call. Tried all available numbers. Left message on voicemail of # that was listed as "best" number under "Questions".

## 2016-04-06 ENCOUNTER — Inpatient Hospital Stay (HOSPITAL_COMMUNITY): Admission: RE | Admit: 2016-04-06 | Payer: Medicaid Other | Source: Ambulatory Visit | Admitting: Orthopaedic Surgery

## 2016-04-06 SURGERY — ARTHROPLASTY, HIP, TOTAL, ANTERIOR APPROACH
Anesthesia: Choice | Laterality: Right

## 2016-04-14 ENCOUNTER — Inpatient Hospital Stay (INDEPENDENT_AMBULATORY_CARE_PROVIDER_SITE_OTHER): Payer: Self-pay | Admitting: Orthopaedic Surgery

## 2016-05-06 ENCOUNTER — Other Ambulatory Visit: Payer: Self-pay | Admitting: Family Medicine

## 2016-05-06 DIAGNOSIS — M1611 Unilateral primary osteoarthritis, right hip: Secondary | ICD-10-CM

## 2016-05-06 MED FILL — metFORMIN HCL 500 MG TABS: 500 | 30 days supply | Qty: 120 | Fill #1

## 2016-05-06 MED FILL — FLUoxetine HCL 20 MG CAPS: 20 | 30 days supply | Qty: 30 | Fill #1

## 2016-05-06 MED FILL — ATORVASTATIN 20 MG TABLET: 20 | 30 days supply | Qty: 30 | Fill #1

## 2016-05-06 NOTE — Telephone Encounter (Signed)
PATIENT IS REQUESTING TRAMADOL REFILL  Cb#: [phone]

## 2016-05-07 ENCOUNTER — Other Ambulatory Visit (INDEPENDENT_AMBULATORY_CARE_PROVIDER_SITE_OTHER): Payer: Self-pay

## 2016-05-07 DIAGNOSIS — M25551 Pain in right hip: Secondary | ICD-10-CM

## 2016-05-07 MED ORDER — TRAMADOL HCL 50 MG PO TABS: 50.0000 mg | ORAL_TABLET | Freq: Four times a day (QID) | ORAL | Status: DC | PRN

## 2016-05-07 MED FILL — CYCLOBENZAPRINE 10 MG TAB: 10 | 30 days supply | Qty: 60 | Fill #0

## 2016-05-07 MED FILL — MELOXICAM 15 MG TABLET: 15 | 30 days supply | Qty: 30 | Fill #0

## 2016-05-07 NOTE — Progress Notes (Unsigned)
tra

## 2016-05-07 NOTE — Telephone Encounter (Signed)
Please advise on all three of these medications

## 2016-05-07 NOTE — Telephone Encounter (Signed)
Called into pharmacy

## 2016-05-09 ENCOUNTER — Other Ambulatory Visit (INDEPENDENT_AMBULATORY_CARE_PROVIDER_SITE_OTHER): Payer: Self-pay | Admitting: Orthopaedic Surgery

## 2016-05-10 ENCOUNTER — Other Ambulatory Visit (INDEPENDENT_AMBULATORY_CARE_PROVIDER_SITE_OTHER): Payer: Self-pay | Admitting: Orthopaedic Surgery

## 2016-05-10 MED FILL — traMADol HCL 50 MG TABS: 50 | 6 days supply | Qty: 50 | Fill #0

## 2016-05-10 NOTE — Telephone Encounter (Signed)
Called into pharmacy

## 2016-05-10 NOTE — Telephone Encounter (Signed)
This has already been called in 

## 2016-05-10 NOTE — Telephone Encounter (Signed)
Can you just hit approve so I can sign off on this please 

## 2016-05-10 NOTE — Telephone Encounter (Signed)
Please advise 

## 2016-06-25 ENCOUNTER — Other Ambulatory Visit: Payer: Self-pay | Admitting: Family Medicine

## 2016-06-25 ENCOUNTER — Other Ambulatory Visit: Payer: Self-pay | Admitting: Orthopaedic Surgery

## 2016-06-25 DIAGNOSIS — M1611 Unilateral primary osteoarthritis, right hip: Secondary | ICD-10-CM

## 2016-06-25 DIAGNOSIS — B379 Candidiasis, unspecified: Secondary | ICD-10-CM

## 2016-06-25 MED FILL — NYSTOP 100,000 UNITS/GM PWD: 100000 | 30 days supply | Qty: 45 | Fill #0

## 2016-06-25 MED FILL — metFORMIN HCL 500 MG TABS: 500 | 30 days supply | Qty: 120 | Fill #2

## 2016-06-25 MED FILL — TRUE METRIX TEST STRIP: 30 days supply | Qty: 100 | Fill #2

## 2016-06-25 MED FILL — ATORVASTATIN 20 MG TABLET: 20 | 30 days supply | Qty: 30 | Fill #2

## 2016-06-25 MED FILL — TRUEplus LANCETS 28G MISC: 30 days supply | Qty: 100 | Fill #2

## 2016-06-25 MED FILL — FLUoxetine HCL 20 MG CAPS: 20 | 30 days supply | Qty: 30 | Fill #2

## 2016-06-25 MED FILL — VENTOLIN HFA 90 MCG INHALER: 108 (90 BAS | 20 days supply | Qty: 18 | Fill #1

## 2016-06-28 MED FILL — CYCLOBENZAPRINE 10 MG TAB: 10 | 30 days supply | Qty: 60 | Fill #0

## 2016-06-28 MED FILL — traMADol HCL 50 MG TABS: 50 | 8 days supply | Qty: 50 | Fill #0

## 2016-06-28 MED FILL — MELOXICAM 15 MG TABLET: 15 | 30 days supply | Qty: 30 | Fill #0

## 2016-06-28 NOTE — Telephone Encounter (Signed)
Please advise 

## 2016-06-28 NOTE — Telephone Encounter (Signed)
Called into pharmacy

## 2016-06-29 ENCOUNTER — Ambulatory Visit: Payer: Self-pay | Admitting: Family Medicine

## 2016-07-30 ENCOUNTER — Other Ambulatory Visit: Payer: Self-pay | Admitting: Orthopaedic Surgery

## 2016-07-30 DIAGNOSIS — M1611 Unilateral primary osteoarthritis, right hip: Secondary | ICD-10-CM

## 2016-07-30 MED FILL — MELOXICAM 15 MG TABLET: 15 | 30 days supply | Qty: 30 | Fill #0

## 2016-07-30 MED FILL — ATORVASTATIN 20 MG TABLET: 20 | 30 days supply | Qty: 30 | Fill #3

## 2016-07-30 MED FILL — FLUoxetine HCL 20 MG CAPS: 20 | 30 days supply | Qty: 30 | Fill #3

## 2017-05-06 ENCOUNTER — Other Ambulatory Visit: Payer: Self-pay

## 2017-05-06 ENCOUNTER — Emergency Department (HOSPITAL_COMMUNITY)
Admission: EM | Admit: 2017-05-06 | Discharge: 2017-05-06 | Disposition: A | Discharge status: Home / Self Care | Payer: Self-pay | Attending: Emergency Medicine | Admitting: Emergency Medicine

## 2017-05-06 ENCOUNTER — Emergency Department (HOSPITAL_COMMUNITY): Payer: Self-pay

## 2017-05-06 ENCOUNTER — Encounter (HOSPITAL_COMMUNITY): Payer: Self-pay | Admitting: *Deleted

## 2017-05-06 DIAGNOSIS — E785 Hyperlipidemia, unspecified: Secondary | ICD-10-CM

## 2017-05-06 DIAGNOSIS — Z7984 Long term (current) use of oral hypoglycemic drugs: Secondary | ICD-10-CM | POA: Insufficient documentation

## 2017-05-06 DIAGNOSIS — E1165 Type 2 diabetes mellitus with hyperglycemia: Secondary | ICD-10-CM

## 2017-05-06 DIAGNOSIS — F17210 Nicotine dependence, cigarettes, uncomplicated: Secondary | ICD-10-CM | POA: Insufficient documentation

## 2017-05-06 DIAGNOSIS — Z9114 Patient's other noncompliance with medication regimen: Secondary | ICD-10-CM | POA: Insufficient documentation

## 2017-05-06 DIAGNOSIS — R358 Other polyuria: Secondary | ICD-10-CM | POA: Insufficient documentation

## 2017-05-06 DIAGNOSIS — J45909 Unspecified asthma, uncomplicated: Secondary | ICD-10-CM | POA: Insufficient documentation

## 2017-05-06 DIAGNOSIS — M25551 Pain in right hip: Secondary | ICD-10-CM

## 2017-05-06 DIAGNOSIS — Z79899 Other long term (current) drug therapy: Secondary | ICD-10-CM | POA: Insufficient documentation

## 2017-05-06 DIAGNOSIS — R739 Hyperglycemia, unspecified: Secondary | ICD-10-CM

## 2017-05-06 DIAGNOSIS — F418 Other specified anxiety disorders: Secondary | ICD-10-CM

## 2017-05-06 DIAGNOSIS — Z91148 Patient's other noncompliance with medication regimen for other reason: Secondary | ICD-10-CM

## 2017-05-06 LAB — CBG MONITORING, ED
Glucose-Capillary: 432 mg/dL — ABNORMAL HIGH (ref 65–99)
Glucose-Capillary: 413 mg/dL — ABNORMAL HIGH (ref 65–99)
Glucose-Capillary: 344 mg/dL — ABNORMAL HIGH (ref 65–99)
Glucose-Capillary: 297 mg/dL — ABNORMAL HIGH (ref 65–99)

## 2017-05-06 LAB — I-STAT CHEM 8, ED
BUN: 17 mg/dL (ref 6–20)
Calcium, Ion: 1.25 mmol/L (ref 1.15–1.40)
Chloride: 92 mmol/L — ABNORMAL LOW (ref 101–111)
Creatinine, Ser: 0.6 mg/dL (ref 0.44–1.00)
Glucose, Bld: 625 mg/dL (ref 65–99)
HCT: 49 % — ABNORMAL HIGH (ref 36.0–46.0)
Hemoglobin: 16.7 g/dL — ABNORMAL HIGH (ref 12.0–15.0)
Potassium: 4.5 mmol/L (ref 3.5–5.1)
Sodium: 130 mmol/L — ABNORMAL LOW (ref 135–145)
TCO2: 29 mmol/L (ref 22–32)

## 2017-05-06 MED ORDER — INSULIN ASPART 100 UNIT/ML ~~LOC~~ SOLN
4.0000 [IU] | Freq: Once | SUBCUTANEOUS | Status: AC
  Administered 2017-05-06: 4 [IU] via INTRAVENOUS
  Filled 2017-05-06: qty 1

## 2017-05-06 MED ORDER — SODIUM CHLORIDE 0.9 % IV BOLUS (SEPSIS)
1000.0000 mL | Freq: Once | INTRAVENOUS | Status: AC
  Administered 2017-05-06: 1000 mL via INTRAVENOUS

## 2017-05-06 MED ORDER — METFORMIN HCL 500 MG PO TABS: 1000.0000 mg | ORAL_TABLET | Freq: Two times a day (BID) | ORAL | 0 refills | Status: DC

## 2017-05-06 MED ORDER — ALBUTEROL SULFATE HFA 108 (90 BASE) MCG/ACT IN AERS
2.0000 | INHALATION_SPRAY | RESPIRATORY_TRACT | Status: DC | PRN
  Administered 2017-05-06: 2 via RESPIRATORY_TRACT
  Filled 2017-05-06: qty 6.7

## 2017-05-06 MED ORDER — ATORVASTATIN CALCIUM 20 MG PO TABS: 20.0000 mg | ORAL_TABLET | Freq: Every day | ORAL | 0 refills | Status: DC

## 2017-05-06 MED ORDER — FLUOXETINE HCL 20 MG PO CAPS: 20.0000 mg | ORAL_CAPSULE | Freq: Every day | ORAL | 0 refills | Status: DC

## 2017-05-06 MED ORDER — ACETAMINOPHEN 325 MG PO TABS
650.0000 mg | ORAL_TABLET | Freq: Once | ORAL | Status: AC
  Administered 2017-05-06: 650 mg via ORAL
  Filled 2017-05-06: qty 2

## 2017-05-06 MED ORDER — KETOROLAC TROMETHAMINE 30 MG/ML IJ SOLN
15.0000 mg | Freq: Once | INTRAMUSCULAR | Status: AC
  Administered 2017-05-06: 15 mg via INTRAVENOUS
  Filled 2017-05-06: qty 1

## 2017-05-06 NOTE — ED Triage Notes (Signed)
Pt c/o bil hip pain ongoing x 1 yr, pain recently worsening, pt fell yesterday onto carpet, pt denies LOC & denies hitting head, A&O x4, hx of arthritis

## 2017-05-06 NOTE — ED Provider Notes (Signed)
[facility] Provider Note   CSN: [id number] Arrival date & time: 05/06/17  1059     History   Chief Complaint Chief Complaint  Patient presents with  . Hip Pain  . Hyperglycemia    HPI Nancy MussSally C [name] is a 51 y.o. female.  HPI   Patient is a 51 year old female with a history of depression, GERD, arthritis, polysubstance use, and diabetes presenting for acute pain in the right hip after fall.  Patient reports she has had this pain for a year due to arthritis, but she fell yesterday and again this morning.  Patient denies any syncope or head trauma with these incidents.  Patient has been taking ibuprofen every 4 hours for her symptoms.  Patient denies any weakness or numbness in her lower extremities, loss of bowel or bladder control, IVDU, or history of cancer.  Patient is in a substance use recovery program currently. Patient has not had fever or chills.  Patient was ambulatory with a walker after both falls.  Past Medical History:  Diagnosis Date  . Abnormal Pap smear   . Bronchitis   . Depression   . GERD (gastroesophageal reflux disease)    tums prn  . Headache(784.0)   . Polysubstance abuse [facility](HCC)     Patient Active Problem List   Diagnosis Date Noted  . Primary osteoarthritis of left hip 02/10/2016  . Asthma 12/31/2015  . Hyperlipidemia 10/30/2015  . Depression with anxiety 10/29/2015  . Polysubstance abuse (HCC) 09/04/2015  . Depression 09/04/2015  . Osteoarthritis of right hip 09/04/2015  . Alcohol abuse 12/05/2013  . S/P alcohol detoxification 12/05/2013  . 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 BV (bacterial vaginosis) 11/24/2011  . ASCUS with positive high risk HPV 11/08/2011    Past Surgical History:  Procedure Laterality Date  . CONIZATION CERVIX WITH BIOPSY N/A 12/13/2011   Performed by Allie Bossierove, Myra C, [facility]  . DENTAL SURGERY      OB History    Gravida Para Term Preterm AB Living   1 1 1      1    SAB TAB Ectopic Multiple Live Births                   Home Medications    Prior to Admission medications   Medication Sig Start Date End Date Taking? Authorizing Provider  albuterol (PROVENTIL HFA;VENTOLIN HFA) 108 (90 Base) MCG/ACT inhaler Inhale 2 puffs into the lungs every 6 (six) hours as needed for wheezing or shortness of breath. Reported on 12/31/2015 12/31/15   Jaclyn ShaggyAmao, Enobong, MD  atorvastatin (LIPITOR) 20 MG tablet TAKE 1 TABLET BY MOUTH DAILY. 03/25/16   Jaclyn ShaggyAmao, Enobong, MD  Blood Glucose Monitoring Suppl (TRUE METRIX METER) DEVI 1 each by Does not apply route 3 (three) times daily before meals. 09/04/15   Jaclyn ShaggyAmao, Enobong, MD  cyclobenzaprine (FLEXERIL) 10 MG tablet TAKE 1 TABLET BY MOUTH 2 TIMES DAILY AS NEEDED FOR MUSCLE SPASMS. 06/28/16   Kathryne HitchBlackman, Christopher Y, MD  fluconazole (DIFLUCAN) 150 MG tablet Take 1 tablet (150 mg total) by mouth once. Patient not taking: Reported on 12/31/2015 09/04/15   Jaclyn ShaggyAmao, Enobong, MD  FLUoxetine (PROZAC) 20 MG capsule TAKE ONE CAPSULE BY MOUTH DAILY 03/25/16   Jaclyn ShaggyAmao, Enobong, MD  glucose blood (TRUE METRIX BLOOD GLUCOSE TEST) test strip Use 3 times daily before meals. Patient not taking: Reported on 12/31/2015 09/04/15   Jaclyn ShaggyAmao, Enobong, MD  meloxicam (MOBIC) 15 MG  tablet TAKE 1 TABLET BY MOUTH DAILY. 07/30/16   Kathryne HitchBlackman, Christopher Y, MD  metFORMIN (GLUCOPHAGE) 500 MG tablet Take 2 tablets (1,000 mg total) by mouth 2 (two) times daily with a meal. 10/29/15   Jaclyn ShaggyAmao, Enobong, MD  NYSTATIN powder APPLY TOPICALLY 3 TIMES DAILY. 06/25/16   Jaclyn ShaggyAmao, Enobong, MD  traMADol (ULTRAM) 50 MG tablet TAKE 1 TO 2 TABLETS BY MOUTH EVERY 8 HOURS AS NEEDED FOR PAIN 06/28/16   Kathryne HitchBlackman, Christopher Y, MD  TRUEPLUS LANCETS 28G MISC 1 each by Does not apply route 3 (three) times daily before meals. 09/04/15   Jaclyn ShaggyAmao, Enobong, MD    Family History Family History  Problem Relation Age of Onset  . Cancer Mother        unsure    Social History Social History   Tobacco Use  .  Smoking status: Current Every Day Smoker    Packs/day: 1.00    Years: 20.00    Pack years: 20.00    Types: Cigarettes  . Smokeless tobacco: Never Used  Substance Use Topics  . Alcohol use: Yes    Alcohol/week: 4.2 oz    Types: 7 Cans of beer per week  . Drug use: Yes    Types: "Crack" cocaine, Marijuana    Comment: Pills(Vicodin) heroin (states no today 09/04/15)     Allergies   No known allergies   Review of Systems Review of Systems  Constitutional: Negative for chills and fever.  Genitourinary: Negative for dysuria.  Musculoskeletal: Positive for arthralgias, back pain and gait problem. Negative for neck pain.  Skin: Negative for wound.  Neurological: Negative for weakness and numbness.     Physical Exam Updated Vital Signs BP 116/73   Pulse 84   Temp 98.2 F (36.8 C) (Oral)   Resp 16   Ht 5\' 8"  (1.727 m)   Wt 72.6 kg (160 lb)   LMP 05/06/2016   SpO2 100%   BMI 24.33 kg/m   Physical Exam  Constitutional: She appears well-developed and well-nourished. No distress.  Sitting comfortably in bed.  HENT:  Head: Normocephalic and atraumatic.  Eyes: Conjunctivae are normal. Right eye exhibits no discharge. Left eye exhibits no discharge.  EOMs normal to gross examination.  Neck: Normal range of motion.  Cardiovascular: Normal rate and regular rhythm.  DP pulses 2+ and equal in bilateral lower extremities.  Pulmonary/Chest:  Normal respiratory effort. Patient converses comfortably. No audible wheeze or stridor.  Abdominal: She exhibits no distension.  Musculoskeletal:  Patient can actively flex and extend her right lower extremity.  Patient cannot perform internal or external rotation.  Neurological: She is alert.  Cranial nerves intact to gross observation. Spine Exam: Inspection/Palpation: Tenderness to palpation over right sacroiliac joint down through the greater trochanter. Strength: 5/5 throughout LE bilaterally (hip flexion/extension,  adduction/abduction; knee flexion/extension; foot dorsiflexion/plantarflexion, inversion/eversion; great toe inversion) Sensation: Intact to light touch in proximal and distal LE bilaterally Reflexes: 2+ quadriceps    Skin: Skin is warm and dry. She is not diaphoretic.  Psychiatric: She has a normal mood and affect. Her behavior is normal. Judgment and thought content normal.  Nursing note and vitals reviewed.    ED Treatments / Results  Labs (all labs ordered are listed, but only abnormal results are displayed) Labs Reviewed  I-STAT CHEM 8, ED - Abnormal; Notable for the following components:      Result Value   Sodium 130 (*)    Chloride 92 (*)    Glucose, Bld 625 (*)  Hemoglobin 16.7 (*)    HCT 49.0 (*)    All other components within normal limits    EKG  EKG Interpretation None       Radiology Dg Hip Unilat W Or Wo Pelvis 2-3 Views Right  Result Date: 05/06/2017 CLINICAL DATA:  Bilateral hip pain for 1 year. Increasing pain following fall yesterday. Initial encounter. EXAM: DG HIP (WITH OR WITHOUT PELVIS) 2-3V RIGHT COMPARISON:  None. FINDINGS: There is no evidence of acute fracture or dislocation. Severe degenerative changes in both hips noted, right greater than left. No suspicious focal bony lesions are present. IMPRESSION: 1. No evidence of acute abnormality 2. Severe degenerative changes in the hips, right greater than left. Electronically Signed   By: Harmon Pier M.D.   On: 05/06/2017 14:57    Procedures Procedures (including critical care time)  Medications Ordered in ED Medications  sodium chloride 0.9 % bolus 1,000 mL (1,000 mLs Intravenous New Bag/Given 05/06/17 1536)  acetaminophen (TYLENOL) tablet 650 mg (650 mg Oral Given 05/06/17 1407)  ketorolac (TORADOL) 30 MG/ML injection 15 mg (15 mg Intravenous Given 05/06/17 1538)     Initial Impression / Assessment and Plan / ED Course  I have reviewed the triage vital signs and the nursing  notes.  Pertinent labs & imaging results that were available during my care of the patient were reviewed by me and considered in my medical decision making (see chart for details).      Final Clinical Impressions(s) / ED Diagnoses   Final diagnoses:  None   Will initiate Tylenol for pain control, and x-ray of the right hip.  Patient has no recent creatinine to assess kidney function in order to give Toradol.  We will check i-STAT Chem-8.  Patient found to have a glucose of 625 on i-STAT Chem-8.  Patient does have a history of diabetes but is not closely followed for this.  Patient is supposed to check blood sugar, however she does not routinely perform this.  Per nursing, patient will be transferred to the acute area of the emergency department for lowering of blood glucose.  Patient is complaining of polyuria at present, but has no other symptoms such as dizziness, lightheadedness, or blurry vision.  This case was discussed with Dr. Benjiman Core.  ED Discharge Orders    None       Delia Chimes 05/06/17 1601    Gwyneth Sprout, MD 05/06/17 2004

## 2017-05-06 NOTE — ED Notes (Signed)
EDP aware of pts CBG, pt given water and sandwich, ok by EDP

## 2017-05-06 NOTE — ED Provider Notes (Signed)
  Physical Exam  BP 97/73   Pulse 74   Temp 98.2 F (36.8 C) (Oral)   Resp 16   Ht 5\' 8"  (1.727 m)   Wt 72.6 kg (160 lb)   LMP 05/06/2016   SpO2 98%   BMI 24.33 kg/m   Physical Exam  ED Course  Procedures  MDM Patient transferred to my area for her hyperglycemia.  X-ray of the hip shows chronic changes.  Then later complaining of right knee pain.  Reassuring x-ray.  Sugar is elevated but not in DKA.  Required fluid boluses and insulin to help bring the sugar down.  She been not compliant with her medicines due to financial issues.  Seen by care management and follow-up arranged and helped her get her medicines.  Will discharge home with follow-up on Monday.       Benjiman CorePickering, Gabrielle Mester, MD 05/06/17 2136

## 2017-05-06 NOTE — ED Notes (Signed)
ED Provider at bedside. 

## 2017-05-06 NOTE — ED Notes (Signed)
PA Dayton ScrapeMurray made aware pt can move to pod C per Charge RN due to increase in acuity.

## 2017-05-06 NOTE — ED Notes (Signed)
CBG 413 

## 2017-05-06 NOTE — Care Management (Signed)
ED CM consulted concerning patient needing Primary Care follow up and medication assistance. ED CM met with patient at bedside to discuss transitional care planning. Patient is uninsured and has attended the [facility] in the past. Patient presented to the [facility] ED with CBG of 672m/dl. Patient reports she has had some life issues and has not been able to follow up. CM explained the importance of f/u and the risk from not follow ing up. Patient verbalized understanding [facility] CM will contact [facility] CM to assist with arranging an appointment at the clinic. Patient also states, she does not have the funds to get medications, CM reviewed MATCH patient is eligible patient was enrolled and [place] and handed to patient with instructions on how to redeem.  Patient verbalized understanding and is agreeable with transitional care plan. Reviewed transitional care plan and instructed her to contact CM at clinic on Monday to arrange f/u appointment. Verbalized understanding and teach back done. Updated EDP  No further ED CM needs identified.

## 2017-05-06 NOTE — ED Notes (Signed)
Pt ambulated to doorway of room asking to go to restroom, assisted pt with wheelchair to restroom and back to room.  Pt ambulated from wheelchair to exam chair in room without assistance.

## 2017-05-06 NOTE — ED Notes (Signed)
Pt c.o right knee pain, EDP made aware

## 2017-05-06 NOTE — ED Notes (Signed)
Pt departed in NAD.  

## 2017-05-06 NOTE — ED Notes (Signed)
Patient transported to X-ray 

## 2017-05-09 ENCOUNTER — Telehealth: Payer: Self-pay | Admitting: General Practice

## 2017-05-09 NOTE — Telephone Encounter (Signed)
Call placed to patient # [phone] in order to schedule a [facility]. No answer. Left patient a message asking her to return the call at [phone].

## 2017-05-10 ENCOUNTER — Telehealth: Payer: Self-pay | Admitting: General Practice

## 2017-05-10 NOTE — Telephone Encounter (Signed)
Call placed to patient #[phone], regarding a hospital follow up appointment. Spoke with patient and informed her that hospital case manager contacted us in order to schedule patient a hospital [facility]. Informed patient that we have an opening for tomorrow Wednesday 11/21 at 9:45am and patient stated that she can't come in. Informed patient that the office would be close for the remaining of the week because of the holiday. Encouraged patient to call on Monday 11/26 to schedule an appointment at her best convenience. Patient understood and had no further questions.

## 2017-05-17 ENCOUNTER — Telehealth: Payer: Self-pay | Admitting: General Practice

## 2017-05-17 NOTE — Telephone Encounter (Signed)
Pt called to receive information on her 100% CAFA she has some questions  Please follow up

## 2017-06-27 ENCOUNTER — Telehealth: Payer: Self-pay | Admitting: Internal Medicine

## 2017-06-27 ENCOUNTER — Telehealth: Payer: Self-pay | Admitting: General Practice

## 2017-06-27 NOTE — Telephone Encounter (Signed)
Patient has not been seen in a year and a half. Will forward to Dr. Laural BenesJohnson who patient is establishing with.

## 2017-06-27 NOTE — Telephone Encounter (Signed)
I called pt to informed of the provider decision of not refill until she see her, I left the information for [facility] [phone] to see if the have a sooner appt

## 2017-06-27 NOTE — Telephone Encounter (Signed)
Pt called to request a refill for albuterol (PROVENTIL HFA;VENTOLIN HFA) 108 (90 Base) MCG/ACT inhaler  Blood Glucose Monitoring Suppl (TRUE METRIX METER) DEVI  fluconazole (DIFLUCAN) 150 MG tablet glucose blood (TRUE METRIX BLOOD GLUCOSE TEST) test strip NYSTATIN powder  TRUEPLUS LANCETS 28G MISC   [facility] She has an appt for the ned of the month Please follow up

## 2017-06-27 NOTE — Telephone Encounter (Signed)
Pt called states she has 2 spots on her legs that are swelling and itchy and would like to know what she could use. Pt was advised to f/up at urgent or ED since she needs to re-est care

## 2017-07-05 ENCOUNTER — Ambulatory Visit: Payer: Self-pay | Attending: Internal Medicine

## 2017-07-08 ENCOUNTER — Encounter (INDEPENDENT_AMBULATORY_CARE_PROVIDER_SITE_OTHER): Payer: Self-pay | Admitting: Physician Assistant

## 2017-07-08 ENCOUNTER — Other Ambulatory Visit: Payer: Self-pay

## 2017-07-08 ENCOUNTER — Ambulatory Visit (INDEPENDENT_AMBULATORY_CARE_PROVIDER_SITE_OTHER): Payer: Self-pay | Admitting: Physician Assistant

## 2017-07-08 ENCOUNTER — Other Ambulatory Visit (INDEPENDENT_AMBULATORY_CARE_PROVIDER_SITE_OTHER): Payer: Self-pay | Admitting: Physician Assistant

## 2017-07-08 VITALS — BP 118/85 | HR 93 | Temp 98.0°F | Wt 161.2 lb

## 2017-07-08 DIAGNOSIS — R252 Cramp and spasm: Secondary | ICD-10-CM

## 2017-07-08 DIAGNOSIS — M1611 Unilateral primary osteoarthritis, right hip: Secondary | ICD-10-CM

## 2017-07-08 DIAGNOSIS — E119 Type 2 diabetes mellitus without complications: Secondary | ICD-10-CM

## 2017-07-08 DIAGNOSIS — R739 Hyperglycemia, unspecified: Secondary | ICD-10-CM

## 2017-07-08 DIAGNOSIS — Z9119 Patient's noncompliance with other medical treatment and regimen: Secondary | ICD-10-CM

## 2017-07-08 DIAGNOSIS — F418 Other specified anxiety disorders: Secondary | ICD-10-CM

## 2017-07-08 DIAGNOSIS — J029 Acute pharyngitis, unspecified: Secondary | ICD-10-CM

## 2017-07-08 DIAGNOSIS — L03115 Cellulitis of right lower limb: Secondary | ICD-10-CM

## 2017-07-08 DIAGNOSIS — M1612 Unilateral primary osteoarthritis, left hip: Secondary | ICD-10-CM

## 2017-07-08 DIAGNOSIS — Z91199 Patient's noncompliance with other medical treatment and regimen due to unspecified reason: Secondary | ICD-10-CM

## 2017-07-08 DIAGNOSIS — L03116 Cellulitis of left lower limb: Secondary | ICD-10-CM

## 2017-07-08 DIAGNOSIS — E7841 Elevated Lipoprotein(a): Secondary | ICD-10-CM

## 2017-07-08 DIAGNOSIS — H43391 Other vitreous opacities, right eye: Secondary | ICD-10-CM

## 2017-07-08 DIAGNOSIS — Z79899 Other long term (current) drug therapy: Secondary | ICD-10-CM

## 2017-07-08 LAB — POCT GLYCOSYLATED HEMOGLOBIN (HGB A1C): Hemoglobin A1C: 14.6

## 2017-07-08 LAB — GLUCOSE, POCT (MANUAL RESULT ENTRY)
POC Glucose: 584 mg/dl — AB (ref 70–99)
POC Glucose: 480 mg/dl — AB (ref 70–99)

## 2017-07-08 MED ORDER — CEPHALEXIN 500 MG PO TABS: 500.0000 mg | ORAL_TABLET | Freq: Three times a day (TID) | ORAL | 0 refills | Status: DC

## 2017-07-08 MED ORDER — NAPROXEN 500 MG PO TABS: 500.0000 mg | ORAL_TABLET | Freq: Two times a day (BID) | ORAL | 0 refills | Status: DC

## 2017-07-08 MED ORDER — ESCITALOPRAM OXALATE 20 MG PO TABS: 20.0000 mg | ORAL_TABLET | Freq: Every day | ORAL | 2 refills | Status: DC

## 2017-07-08 MED ORDER — ATORVASTATIN CALCIUM 20 MG PO TABS: 20.0000 mg | ORAL_TABLET | Freq: Every day | ORAL | 3 refills | Status: DC

## 2017-07-08 MED ORDER — METFORMIN HCL 1000 MG PO TABS: 1000.0000 mg | ORAL_TABLET | Freq: Two times a day (BID) | ORAL | 3 refills | Status: DC

## 2017-07-08 MED ORDER — FLUCONAZOLE 150 MG PO TABS: 150.0000 mg | ORAL_TABLET | Freq: Once | ORAL | 0 refills | Status: AC

## 2017-07-08 MED ORDER — GLIMEPIRIDE 4 MG PO TABS: 4.0000 mg | ORAL_TABLET | Freq: Every day | ORAL | 3 refills | Status: DC

## 2017-07-08 MED ORDER — CEPHALEXIN 500 MG PO TABS: 500.0000 mg | ORAL_TABLET | Freq: Three times a day (TID) | ORAL | 0 refills | Status: AC

## 2017-07-08 MED ORDER — "INSULIN SYRINGE 29G X 1/2"" 1 ML MISC": 1.0000 "application " | Freq: Every day | 5 refills | Status: DC

## 2017-07-08 MED ORDER — TRUEPLUS LANCETS 28G MISC
1.0000 | Freq: Three times a day (TID) | 12 refills | Status: DC
Start: 2017-07-08 — End: 2017-07-08

## 2017-07-08 MED ORDER — CYCLOBENZAPRINE HCL 10 MG PO TABS: 10.0000 mg | ORAL_TABLET | Freq: Three times a day (TID) | ORAL | 0 refills | Status: DC | PRN

## 2017-07-08 MED ORDER — INSULIN GLARGINE 100 UNIT/ML ~~LOC~~ SOLN: 25.0000 [IU] | Freq: Every day | SUBCUTANEOUS | 11 refills | Status: DC

## 2017-07-08 MED ORDER — GLUCOSE BLOOD VI STRP: ORAL_STRIP | 11 refills | Status: DC

## 2017-07-08 MED ORDER — TRUEPLUS LANCETS 28G MISC: 1.0000 | Freq: Three times a day (TID) | 12 refills | Status: DC

## 2017-07-08 MED ORDER — "INSULIN SYRINGE 30G X 1/2"" 1 ML MISC": 1.0000 "application " | Freq: Every day | 5 refills | Status: DC

## 2017-07-08 MED ORDER — FLUCONAZOLE 150 MG PO TABS: 150.0000 mg | ORAL_TABLET | Freq: Once | ORAL | 0 refills | Status: DC

## 2017-07-08 MED FILL — ?GLIMEPIRIDE 4 MG TABLET: 4 | 30 days supply | Qty: 30 | Fill #0

## 2017-07-08 MED FILL — NAPROXEN 500 MG TABLET: 500 | 15 days supply | Qty: 30 | Fill #0

## 2017-07-08 MED FILL — ?ATORVASTATIN 20MG TABLET: 20 | 30 days supply | Qty: 30 | Fill #0

## 2017-07-08 MED FILL — TRUEplus LANCETS 28G MISC: 30 days supply | Qty: 100 | Fill #0

## 2017-07-08 MED FILL — ESCITALOPRAM 20 MG TABLET: 20 | 30 days supply | Qty: 30 | Fill #0

## 2017-07-08 MED FILL — FLUCONAZOLE 150 MG TABLET: 150 | 1 days supply | Qty: 1 | Fill #0

## 2017-07-08 MED FILL — TRUE METRIX TEST STRIP: 30 days supply | Qty: 100 | Fill #0

## 2017-07-08 MED FILL — CEPHALEXIN 500 MG CAPSULE: 500 | 10 days supply | Qty: 30 | Fill #0

## 2017-07-08 MED FILL — CYCLOBENZAPRINE 10 MG TAB: 10 | 10 days supply | Qty: 30 | Fill #0

## 2017-07-08 MED FILL — ?METFORMIN HCL 1,000 MG TAB: 1000 | 30 days supply | Qty: 60 | Fill #0

## 2017-07-08 MED FILL — !LANTUS 100 UNITS/ML VIAL: 100 | 28 days supply | Qty: 10 | Fill #0

## 2017-07-08 NOTE — Progress Notes (Signed)
Pt has spots on both legs Pt complains of severe muscle spasms  Pt needs referral to orthopedic for hip replacement surgery  Pt complains of knee pain

## 2017-07-08 NOTE — Patient Instructions (Signed)
Diabetes Mellitus and Nutrition When you have diabetes (diabetes mellitus), it is very important to have healthy eating habits because your blood sugar (glucose) levels are greatly affected by what you eat and drink. Eating healthy foods in the appropriate amounts, at about the same times every day, can help you:  Control your blood glucose.  Lower your risk of heart disease.  Improve your blood pressure.  Reach or maintain a healthy weight.  Every person with diabetes is different, and each person has different needs for a meal plan. Your health care provider may recommend that you work with a diet and nutrition specialist (dietitian) to make a meal plan that is best for you. Your meal plan may vary depending on factors such as:  The calories you need.  The medicines you take.  Your weight.  Your blood glucose, blood pressure, and cholesterol levels.  Your activity level.  Other health conditions you have, such as heart or kidney disease.  How do carbohydrates affect me? Carbohydrates affect your blood glucose level more than any other type of food. Eating carbohydrates naturally increases the amount of glucose in your blood. Carbohydrate counting is a method for keeping track of how many carbohydrates you eat. Counting carbohydrates is important to keep your blood glucose at a healthy level, especially if you use insulin or take certain oral diabetes medicines. It is important to know how many carbohydrates you can safely have in each meal. This is different for every person. Your dietitian can help you calculate how many carbohydrates you should have at each meal and for snack. Foods that contain carbohydrates include:  Bread, cereal, rice, pasta, and crackers.  Potatoes and corn.  Peas, beans, and lentils.  Milk and yogurt.  Fruit and juice.  Desserts, such as cakes, cookies, ice cream, and candy.  How does alcohol affect me? Alcohol can cause a sudden decrease in blood  glucose (hypoglycemia), especially if you use insulin or take certain oral diabetes medicines. Hypoglycemia can be a life-threatening condition. Symptoms of hypoglycemia (sleepiness, dizziness, and confusion) are similar to symptoms of having too much alcohol. If your health care provider says that alcohol is safe for you, follow these guidelines:  Limit alcohol intake to no more than 1 drink per day for nonpregnant women and 2 drinks per day for men. One drink equals 12 oz of beer, 5 oz of wine, or 1 oz of hard liquor.  Do not drink on an empty stomach.  Keep yourself hydrated with water, diet soda, or unsweetened iced tea.  Keep in mind that regular soda, juice, and other mixers may contain a lot of sugar and must be counted as carbohydrates.  What are tips for following this plan? Reading food labels  Start by checking the serving size on the label. The amount of calories, carbohydrates, fats, and other nutrients listed on the label are based on one serving of the food. Many foods contain more than one serving per package.  Check the total grams (g) of carbohydrates in one serving. You can calculate the number of servings of carbohydrates in one serving by dividing the total carbohydrates by 15. For example, if a food has 30 g of total carbohydrates, it would be equal to 2 servings of carbohydrates.  Check the number of grams (g) of saturated and trans fats in one serving. Choose foods that have low or no amount of these fats.  Check the number of milligrams (mg) of sodium in one serving. Most people   should limit total sodium intake to less than 2,300 mg per day.  Always check the nutrition information of foods labeled as "low-fat" or "nonfat". These foods may be higher in added sugar or refined carbohydrates and should be avoided.  Talk to your dietitian to identify your daily goals for nutrients listed on the label. Shopping  Avoid buying canned, premade, or processed foods. These  foods tend to be high in fat, sodium, and added sugar.  Shop around the outside edge of the grocery store. This includes fresh fruits and vegetables, bulk grains, fresh meats, and fresh dairy. Cooking  Use low-heat cooking methods, such as baking, instead of high-heat cooking methods like deep frying.  Cook using healthy oils, such as olive, canola, or sunflower oil.  Avoid cooking with butter, cream, or high-fat meats. Meal planning  Eat meals and snacks regularly, preferably at the same times every day. Avoid going long periods of time without eating.  Eat foods high in fiber, such as fresh fruits, vegetables, beans, and whole grains. Talk to your dietitian about how many servings of carbohydrates you can eat at each meal.  Eat 4-6 ounces of lean protein each day, such as lean meat, chicken, fish, eggs, or tofu. 1 ounce is equal to 1 ounce of meat, chicken, or fish, 1 egg, or 1/4 cup of tofu.  Eat some foods each day that contain healthy fats, such as avocado, nuts, seeds, and fish. Lifestyle   Check your blood glucose regularly.  Exercise at least 30 minutes 5 or more days each week, or as told by your health care provider.  Take medicines as told by your health care provider.  Do not use any products that contain nicotine or tobacco, such as cigarettes and e-cigarettes. If you need help quitting, ask your health care provider.  Work with a counselor or diabetes educator to identify strategies to manage stress and any emotional and social challenges. What are some questions to ask my health care provider?  Do I need to meet with a diabetes educator?  Do I need to meet with a dietitian?  What number can I call if I have questions?  When are the best times to check my blood glucose? Where to find more information:  American Diabetes Association: diabetes.org/food-and-fitness/food  Academy of Nutrition and Dietetics:  www.eatright.org/resources/health/diseases-and-conditions/diabetes  National Institute of Diabetes and Digestive and Kidney Diseases (NIH): www.niddk.nih.gov/health-information/diabetes/overview/diet-eating-physical-activity Summary  A healthy meal plan will help you control your blood glucose and maintain a healthy lifestyle.  Working with a diet and nutrition specialist (dietitian) can help you make a meal plan that is best for you.  Keep in mind that carbohydrates and alcohol have immediate effects on your blood glucose levels. It is important to count carbohydrates and to use alcohol carefully. This information is not intended to replace advice given to you by your health care provider. Make sure you discuss any questions you have with your health care provider. Document Released: 03/04/2005 Document Revised: 07/12/2016 Document Reviewed: 07/12/2016 Elsevier Interactive Patient Education  2018 [employer]  

## 2017-07-08 NOTE — Progress Notes (Signed)
Subjective:  Patient ID: Nancy [name], female    DOB: 07/10/1965  Age: 52 y.o. MRN: [id number]  CC: DM  HPI Nancy MussSally C Seago is a 52 y.o. female with a medical history of DM2, noncompliance, depression, GERD, HA, polysubstance abuse, abnormal Pap smear, right hip pain, tobacco use, and bronchitis presents as a new patient with multiple compaints.    Has uncontrolled diabetes. Was seen in the ED two months ago for DM but ran out of medications prescribed at ED one month ago. A1c 14.6% in clinic today. Endorses polydipsia, polyuria, visual blurring, and fatigue. States she has been seeing a black spot that quickly disappears on the right side.      Has right > left hip pain. Was diagnosed with primary osteoarthritis of both hips. Was supposed to have right total hip arthroplasty but surgery was cancelled due to lack of insurance coverage. Says hip pains cause muscle cramps in the lower back and RLE. Has been having more frequent muscle cramps x1 month. Has not taken atorvastatin or any other medication for one month now so cramps are not thought to be related to atorvastatin.     Has patches of redness and scabbing in the shin of RLE and LLE. Onset of redness occurred after injury with furniture at her home. Has applied Neosporin and is trying to keep wounds clean.      Complains of sore throat x4 days. No associated upper respiratory symptoms. Has not taken anything for relief. No close contacts with the same. Does not endorse f/c/n/v, CP, SOB, HA, facial pressure, ear pain, eye pain, abdominal pain, rash, or GI/GU sxs.      Has depression and anxiety for many years now. Has taken Prozac with some relief but has run out . She was doubling her dose on her own accord because she did not feel one pill was effective enough. No recent psychiatric visits. Feels she needs a refill of Prozac. No suicidal ideation/intent.       Outpatient Medications Prior to Visit  Medication Sig Dispense Refill  .  albuterol (PROVENTIL HFA;VENTOLIN HFA) 108 (90 Base) MCG/ACT inhaler Inhale 2 puffs into the lungs every 6 (six) hours as needed for wheezing or shortness of breath. Reported on 12/31/2015 1 each 3  . atorvastatin (LIPITOR) 20 MG tablet Take 1 tablet (20 mg total) daily by mouth. 30 tablet 0  . Blood Glucose Monitoring Suppl (TRUE METRIX METER) DEVI 1 each by Does not apply route 3 (three) times daily before meals. 1 Device 0  . cyclobenzaprine (FLEXERIL) 10 MG tablet TAKE 1 TABLET BY MOUTH 2 TIMES DAILY AS NEEDED FOR MUSCLE SPASMS. 60 tablet 0  . fluconazole (DIFLUCAN) 150 MG tablet Take 1 tablet (150 mg total) by mouth once. (Patient not taking: Reported on 12/31/2015) 1 tablet 0  . FLUoxetine (PROZAC) 20 MG capsule Take 1 capsule (20 mg total) daily by mouth. 30 capsule 0  . glucose blood (TRUE METRIX BLOOD GLUCOSE TEST) test strip Use 3 times daily before meals. (Patient not taking: Reported on 12/31/2015) 100 each 12  . meloxicam (MOBIC) 15 MG tablet TAKE 1 TABLET BY MOUTH DAILY. 30 tablet 0  . metFORMIN (GLUCOPHAGE) 500 MG tablet Take 2 tablets (1,000 mg total) 2 (two) times daily with a meal by mouth. 120 tablet 0  . NYSTATIN powder APPLY TOPICALLY 3 TIMES DAILY. 45 g 0  . traMADol (ULTRAM) 50 MG tablet TAKE 1 TO 2 TABLETS BY MOUTH EVERY 8 HOURS AS  NEEDED FOR PAIN 50 tablet 0  . TRUEPLUS LANCETS 28G MISC 1 each by Does not apply route 3 (three) times daily before meals. 100 each 12   Facility-Administered Medications Prior to Visit  Medication Dose Route Frequency Provider Last Rate Last Dose  . traMADol (ULTRAM) tablet 50 mg  50 mg Oral Q6H PRN Kathryne Hitch, MD         ROS Review of Systems  Constitutional: Negative for chills, fever and malaise/fatigue.  HENT: Positive for sore throat. Negative for ear pain and sinus pain.   Eyes: Negative for blurred vision and pain.  Respiratory: Negative for shortness of breath.   Cardiovascular: Negative for chest pain and palpitations.   Gastrointestinal: Negative for abdominal pain and nausea.  Genitourinary: Negative for dysuria and hematuria.  Musculoskeletal: Positive for back pain, joint pain and myalgias.  Skin: Negative for rash.  Neurological: Negative for tingling and headaches.  Psychiatric/Behavioral: Positive for depression. The patient is nervous/anxious.     Objective:   Vitals:   07/08/17 0838  BP: 118/85  Pulse: 93  Temp: 98 F (36.7 C)  SpO2: 96%      Physical Exam  Constitutional: She is oriented to person, place, and time.  Well developed, well nourished, NAD, polite, ambulating with one crutch  HENT:  Head: Normocephalic and atraumatic.  Mouth/Throat: No oropharyngeal exudate.  Mild pharyngeal erythema. Tonsils 1+.  Eyes: Conjunctivae and EOM are normal. Pupils are equal, round, and reactive to light. Right eye exhibits no discharge. Left eye exhibits no discharge. No scleral icterus.  Neck: Normal range of motion. Neck supple. No thyromegaly present.  Cardiovascular: Normal rate, regular rhythm and normal heart sounds.  Pulmonary/Chest: Effort normal and breath sounds normal. No respiratory distress. She has no wheezes.  Abdominal: Soft. Bowel sounds are normal. She exhibits no mass. There is no tenderness.  Musculoskeletal: She exhibits no edema.  Lymphadenopathy:    She has no cervical adenopathy.  Neurological: She is alert and oriented to person, place, and time. No cranial nerve deficit. Coordination normal.  Skin: Skin is warm and dry. No rash noted. No erythema. No pallor.  Large patches of erythema and crusting on the shin bilaterally. No necrosis, suppuration or active bleeding.  Psychiatric: She has a normal mood and affect. Her behavior is normal. Thought content normal.  Vitals reviewed.    Assessment & Plan:   1. Type 2 diabetes mellitus without complication, without long-term current use of insulin (HCC) - metFORMIN (GLUCOPHAGE) 1000 MG tablet; Take 1 tablet (1,000  mg total) by mouth 2 (two) times daily with a meal.  Dispense: 180 tablet; Refill: 3 - glimepiride (AMARYL) 4 MG tablet; Take 1 tablet (4 mg total) by mouth daily before breakfast.  Dispense: 30 tablet; Refill: 3 - insulin glargine (LANTUS) 100 UNIT/ML injection; Inject 0.25 mLs (25 Units total) into the skin at bedtime.  Dispense: 10 mL; Refill: 11 - INSULIN SYRINGE 1CC/29G 29G X 1/2" 1 ML MISC; 1 application by Does not apply route at bedtime.  Dispense: 30 each; Refill: 5  2. Hyperglycemia - HgB A1c 14.6 in clinic today. - Glucose (CBG) 584 in clinic today.  3. Primary osteoarthritis of right hip - AMB referral to orthopedics  4. Primary osteoarthritis of left hip - AMB referral to orthopedics  5. Depression with anxiety - Begin escitalopram (LEXAPRO) 20 MG tablet; Take 1 tablet (20 mg total) by mouth daily.  Dispense: 30 tablet; Refill: 2  6. Cellulitis of left lower extremity -  Cephalexin 500 MG tablet; Take 1 tablet (500 mg total) by mouth 3 (three) times daily for 10 days.  Dispense: 30 tablet; Refill: 0  7. Cellulitis of right lower extremity - Cephalexin 500 MG tablet; Take 1 tablet (500 mg total) by mouth 3 (three) times daily for 10 days.  Dispense: 30 tablet; Refill: 0  8. Muscle cramps - cyclobenzaprine (FLEXERIL) 10 MG tablet; Take 1 tablet (10 mg total) by mouth 3 (three) times daily as needed for muscle spasms.  Dispense: 30 tablet; Refill: 0 - naproxen (NAPROSYN) 500 MG tablet; Take 1 tablet (500 mg total) by mouth 2 (two) times daily with a meal.  Dispense: 30 tablet; Refill: 0  9. Acute pharyngitis, unspecified etiology - naproxen (NAPROSYN) 500 MG tablet; Take 1 tablet (500 mg total) by mouth 2 (two) times daily with a meal.  Dispense: 30 tablet; Refill: 0  10. Vitreous floaters of right eye - Ambulatory referral to Ophthalmology   Meds ordered this encounter  Medications  . metFORMIN (GLUCOPHAGE) 1000 MG tablet    Sig: Take 1 tablet (1,000 mg total) by mouth  2 (two) times daily with a meal.    Dispense:  180 tablet    Refill:  3    Order Specific Question:   Supervising Provider    Answer:   Quentin Angst [id number]  . glimepiride (AMARYL) 4 MG tablet    Sig: Take 1 tablet (4 mg total) by mouth daily before breakfast.    Dispense:  30 tablet    Refill:  3    Order Specific Question:   Supervising Provider    Answer:   Quentin Angst [id number]  . insulin glargine (LANTUS) 100 UNIT/ML injection    Sig: Inject 0.25 mLs (25 Units total) into the skin at bedtime.    Dispense:  10 mL    Refill:  11    Order Specific Question:   Supervising Provider    Answer:   Quentin Angst [id number]  . INSULIN SYRINGE 1CC/29G 29G X 1/2" 1 ML MISC    Sig: 1 application by Does not apply route at bedtime.    Dispense:  30 each    Refill:  5    Order Specific Question:   Supervising Provider    Answer:   Quentin Angst [id number]  . escitalopram (LEXAPRO) 20 MG tablet    Sig: Take 1 tablet (20 mg total) by mouth daily.    Dispense:  30 tablet    Refill:  2    Order Specific Question:   Supervising Provider    Answer:   Quentin Angst [id number]  . Cephalexin 500 MG tablet    Sig: Take 1 tablet (500 mg total) by mouth 3 (three) times daily for 10 days.    Dispense:  30 tablet    Refill:  0    Order Specific Question:   Supervising Provider    Answer:   Quentin Angst [id number]  . cyclobenzaprine (FLEXERIL) 10 MG tablet    Sig: Take 1 tablet (10 mg total) by mouth 3 (three) times daily as needed for muscle spasms.    Dispense:  30 tablet    Refill:  0    Order Specific Question:   Supervising Provider    Answer:   Quentin Angst [id number]  . naproxen (NAPROSYN) 500 MG tablet    Sig: Take 1 tablet (500 mg total) by mouth 2 (two) times daily with a meal.  Dispense:  30 tablet    Refill:  0    Order Specific Question:   Supervising Provider    Answer:   Quentin Angst [id number]    Follow-up: Return in  about 4 weeks (around 08/05/2017) for dm.   Loletta Specter PA

## 2017-07-09 LAB — COMPREHENSIVE METABOLIC PANEL
ALT: 24 IU/L (ref 0–32)
AST: 24 IU/L (ref 0–40)
Albumin/Globulin Ratio: 1.5 (ref 1.2–2.2)
Albumin: 4.8 g/dL (ref 3.5–5.5)
Alkaline Phosphatase: 97 IU/L (ref 39–117)
BUN/Creatinine Ratio: 22 (ref 9–23)
BUN: 15 mg/dL (ref 6–24)
Bilirubin Total: 0.3 mg/dL (ref 0.0–1.2)
CO2: 20 mmol/L (ref 20–29)
Calcium: 10.4 mg/dL — ABNORMAL HIGH (ref 8.7–10.2)
Chloride: 92 mmol/L — ABNORMAL LOW (ref 96–106)
Creatinine, Ser: 0.69 mg/dL (ref 0.57–1.00)
GFR calc Af Amer: 117 mL/min/{1.73_m2} (ref >59)
GFR calc non Af Amer: 102 mL/min/{1.73_m2} (ref >59)
Globulin, Total: 3.1 g/dL (ref 1.5–4.5)
Glucose: 496 mg/dL — ABNORMAL HIGH (ref 65–99)
Potassium: 4.6 mmol/L (ref 3.5–5.2)
Sodium: 132 mmol/L — ABNORMAL LOW (ref 134–144)
Total Protein: 7.9 g/dL (ref 6.0–8.5)

## 2017-07-09 LAB — TSH: TSH: 7.16 u[IU]/mL — ABNORMAL HIGH (ref 0.450–4.500)

## 2017-07-11 ENCOUNTER — Telehealth: Payer: Self-pay

## 2017-07-11 ENCOUNTER — Other Ambulatory Visit (INDEPENDENT_AMBULATORY_CARE_PROVIDER_SITE_OTHER): Payer: Self-pay | Admitting: Physician Assistant

## 2017-07-11 DIAGNOSIS — R7989 Other specified abnormal findings of blood chemistry: Secondary | ICD-10-CM

## 2017-07-11 NOTE — Telephone Encounter (Signed)
-----   Message from Loletta Specteroger David Gomez, PA-C sent at 07/11/2017  8:45 AM EST ----- Glucose high as expected. TSH is elevated. She will need to do the full panel. Please have her return for lab only. I will order now.

## 2017-07-11 NOTE — Telephone Encounter (Signed)
Left patient a message about elevated glucose and TSH. Informed that she will need a full Thyroid panel and can come in at anytime to have it done. Asked patient to call office to let us know when she expects to come in for the additional testing. Maryjean Mornempestt S Roberts, CMA

## 2017-07-18 ENCOUNTER — Ambulatory Visit: Payer: Self-pay | Admitting: Internal Medicine

## 2017-07-29 ENCOUNTER — Ambulatory Visit (INDEPENDENT_AMBULATORY_CARE_PROVIDER_SITE_OTHER): Payer: Self-pay | Admitting: Physician Assistant

## 2017-07-29 ENCOUNTER — Encounter (INDEPENDENT_AMBULATORY_CARE_PROVIDER_SITE_OTHER): Payer: Self-pay | Admitting: Physician Assistant

## 2017-07-29 VITALS — Ht 68.0 in | Wt 163.0 lb

## 2017-07-29 DIAGNOSIS — E1165 Type 2 diabetes mellitus with hyperglycemia: Secondary | ICD-10-CM | POA: Insufficient documentation

## 2017-07-29 DIAGNOSIS — E119 Type 2 diabetes mellitus without complications: Secondary | ICD-10-CM | POA: Insufficient documentation

## 2017-07-29 DIAGNOSIS — R5383 Other fatigue: Secondary | ICD-10-CM

## 2017-07-29 DIAGNOSIS — R7989 Other specified abnormal findings of blood chemistry: Secondary | ICD-10-CM

## 2017-07-29 DIAGNOSIS — R21 Rash and other nonspecific skin eruption: Secondary | ICD-10-CM

## 2017-07-29 DIAGNOSIS — E11620 Type 2 diabetes mellitus with diabetic dermatitis: Secondary | ICD-10-CM

## 2017-07-29 LAB — GLUCOSE, POCT (MANUAL RESULT ENTRY): POC Glucose: 149 mg/dl — AB (ref 70–99)

## 2017-07-29 MED ORDER — ACETAMINOPHEN-CODEINE #3 300-30 MG PO TABS: 1.0000 | ORAL_TABLET | Freq: Three times a day (TID) | ORAL | 0 refills | Status: DC | PRN

## 2017-07-29 MED ORDER — CEPHALEXIN 500 MG PO TABS: 500.0000 mg | ORAL_TABLET | Freq: Three times a day (TID) | ORAL | 0 refills | Status: AC

## 2017-07-29 MED FILL — CEPHALEXIN 500 MG CAPSULE: 500 | 10 days supply | Qty: 30 | Fill #0

## 2017-07-29 MED FILL — ACETAMINOPHEN/COD #3 TABLET: 300-30 | 14 days supply | Qty: 42 | Fill #0

## 2017-07-29 NOTE — Patient Instructions (Signed)

## 2017-07-29 NOTE — Progress Notes (Signed)
Subjective:  Patient ID: Nancy [name], female    DOB: [birth year]  Age: 52 y.o. MRN: [id number]  CC: DM  HPI  Nancy [name] is a 52 y.o. female with a medical history of DM2, noncompliance, depression, GERD, HA, polysubstance abuse, abnormal Pap smear, right hip pain, tobacco use, and bronchitis presents to f/u on DM2. Glucometer readings have steadily decreased with the use of Lantus, Metformin, and Glimepiride. Went from averaging 500s to 140s with current regimen. Feeling much better in regards to polydipsia, polyuria, and energy level. Pt concerned with pretibial rash that has appeared and is intensely pruritic and somewhat painful. Has applied antifungal cream with minimal relief. Does not endorse any other complaints or symptoms.       Outpatient Medications Prior to Visit  Medication Sig Dispense Refill  . albuterol (PROVENTIL HFA;VENTOLIN HFA) 108 (90 Base) MCG/ACT inhaler Inhale 2 puffs into the lungs every 6 (six) hours as needed for wheezing or shortness of breath. Reported on 12/31/2015 1 each 3  . atorvastatin (LIPITOR) 20 MG tablet Take 1 tablet (20 mg total) by mouth daily. 90 tablet 3  . Blood Glucose Monitoring Suppl (TRUE METRIX METER) DEVI 1 each by Does not apply route 3 (three) times daily before meals. 1 Device 0  . cyclobenzaprine (FLEXERIL) 10 MG tablet Take 1 tablet (10 mg total) by mouth 3 (three) times daily as needed for muscle spasms. 30 tablet 0  . escitalopram (LEXAPRO) 20 MG tablet Take 1 tablet (20 mg total) by mouth daily. 30 tablet 2  . glimepiride (AMARYL) 4 MG tablet Take 1 tablet (4 mg total) by mouth daily before breakfast. 30 tablet 3  . glucose blood (TRUE METRIX BLOOD GLUCOSE TEST) test strip Use 3 times daily before meals. 100 each 11  . insulin glargine (LANTUS) 100 UNIT/ML injection Inject 0.25 mLs (25 Units total) into the skin at bedtime. 10 mL 11  . Insulin Syringe-Needle U-100 (INSULIN SYRINGE 1CC/30GX1/2") 30G X 1/2" 1 ML MISC 1 application  by Does not apply route daily. 30 each 5  . metFORMIN (GLUCOPHAGE) 1000 MG tablet Take 1 tablet (1,000 mg total) by mouth 2 (two) times daily with a meal. 180 tablet 3  . naproxen (NAPROSYN) 500 MG tablet Take 1 tablet (500 mg total) by mouth 2 (two) times daily with a meal. 30 tablet 0  . TRUEPLUS LANCETS 28G MISC 1 each by Does not apply route 3 (three) times daily before meals. 100 each 12  . NYSTATIN powder APPLY TOPICALLY 3 TIMES DAILY. (Patient not taking: Reported on 07/08/2017) 45 g 0  . traMADol (ULTRAM) 50 MG tablet TAKE 1 TO 2 TABLETS BY MOUTH EVERY 8 HOURS AS NEEDED FOR PAIN (Patient not taking: Reported on 07/08/2017) 50 tablet 0   Facility-Administered Medications Prior to Visit  Medication Dose Route Frequency Provider Last Rate Last Dose  . traMADol (ULTRAM) tablet 50 mg  50 mg Oral Q6H PRN Kathryne Hitch, MD         ROS Review of Systems  Constitutional: Negative for chills, fever and malaise/fatigue.  Eyes: Negative for blurred vision.  Respiratory: Negative for shortness of breath.   Cardiovascular: Negative for chest pain and palpitations.  Gastrointestinal: Negative for abdominal pain and nausea.  Genitourinary: Negative for dysuria and hematuria.  Musculoskeletal: Negative for joint pain and myalgias.  Skin: Positive for rash.  Neurological: Negative for tingling and headaches.  Psychiatric/Behavioral: Negative for depression. The patient is not nervous/anxious.  Objective:  Ht 5\' 8"  (1.727 m)   Wt 163 lb (73.9 kg)   BMI 24.78 kg/m   BP/Weight 07/29/2017 07/08/2017 05/06/2017  Systolic BP - 118 97  Diastolic BP - 85 73  Wt. (Lbs) 163 161.2 160  BMI 24.78 24.51 24.33      Physical Exam  Constitutional: She is oriented to person, place, and time.  Well developed, well nourished, NAD, polite  HENT:  Head: Normocephalic and atraumatic.  Eyes: No scleral icterus.  Neck: Normal range of motion. Neck supple. No thyromegaly present.   Cardiovascular: Normal rate, regular rhythm and normal heart sounds.  Pulmonary/Chest: Effort normal and breath sounds normal.  Abdominal: Soft. Bowel sounds are normal. There is no tenderness.  Musculoskeletal: She exhibits no edema.  Neurological: She is alert and oriented to person, place, and time. No cranial nerve deficit. Coordination normal.  Skin:  Bilateral pretibial region with erythema and yellowish crusting. Areas of excoriations evident. No suppuration, induration, cellulitis, edema, or ecchymosis.   Psychiatric: She has a normal mood and affect. Her behavior is normal. Thought content normal.  Vitals reviewed.    Assessment & Plan:     1. Type 2 diabetes mellitus without complication, unspecified whether long term insulin use (HCC) - Glucose (CBG) 149 mg/dL today  2. Necrobiosis lipoidica diabeticorum (HCC) - Usually self limiting. Will monitor for ulceration over time.   3. Rash and nonspecific skin eruption -Breaks in skin 2/2 NLD, will try to prevent superimposed infection. Cephalexin 500 MG tablet; Take 1 tablet (500 mg total) by mouth 3 (three) times daily for 10 days.  Dispense: 30 tablet; Refill: 0  4. Abnormal TSH - Thyroid Panel With TSH  5. Fatigue, unspecified type - Abnormal TSH. Thyroid panel drawn   Meds ordered this encounter  Medications  . Cephalexin 500 MG tablet    Sig: Take 1 tablet (500 mg total) by mouth 3 (three) times daily for 10 days.    Dispense:  30 tablet    Refill:  0    Order Specific Question:   Supervising Provider    Answer:   Quentin AngstJEGEDE, OLUGBEMIGA E [id number]  . acetaminophen-codeine (TYLENOL #3) 300-30 MG tablet    Sig: Take 1 tablet by mouth every 8 (eight) hours as needed for moderate pain.    Dispense:  42 tablet    Refill:  0    Order Specific Question:   Supervising Provider    Answer:   Quentin AngstJEGEDE, OLUGBEMIGA E [[id number]]    Follow-up: Return in about 2 months (around 09/26/2017) for diabetes.   Loletta Specteroger David Shady Bradish  PA

## 2017-07-30 LAB — THYROID PANEL WITH TSH
Free Thyroxine Index: 1.8 (ref 1.2–4.9)
T3 Uptake Ratio: 35 % (ref 24–39)
T4, Total: 5.2 ug/dL (ref 4.5–12.0)
TSH: 3.49 u[IU]/mL (ref 0.450–4.500)

## 2017-08-02 ENCOUNTER — Telehealth (INDEPENDENT_AMBULATORY_CARE_PROVIDER_SITE_OTHER): Payer: Self-pay | Admitting: *Deleted

## 2017-08-02 NOTE — Telephone Encounter (Signed)
-----   Message from Loletta Specteroger David Gomez, PA-C sent at 08/01/2017  1:44 PM EST ----- Thyroid is completely normal. May have had an inflammatory process that led to the mild elevation seen previously.

## 2017-08-02 NOTE — Telephone Encounter (Signed)
Medical Assistant left message on patient's home and cell voicemail. Voicemail states to give [place] [facility] back [facility] [place] [facility] at [phone]. Patient is aware of thyroid being completely normal.

## 2017-08-03 ENCOUNTER — Encounter (INDEPENDENT_AMBULATORY_CARE_PROVIDER_SITE_OTHER): Payer: Self-pay | Admitting: Orthopaedic Surgery

## 2017-08-03 ENCOUNTER — Ambulatory Visit (INDEPENDENT_AMBULATORY_CARE_PROVIDER_SITE_OTHER): Payer: Self-pay | Admitting: Orthopaedic Surgery

## 2017-08-03 DIAGNOSIS — M1611 Unilateral primary osteoarthritis, right hip: Secondary | ICD-10-CM | POA: Insufficient documentation

## 2017-08-03 DIAGNOSIS — M1612 Unilateral primary osteoarthritis, left hip: Secondary | ICD-10-CM

## 2017-08-03 MED ORDER — TRAMADOL HCL 50 MG PO TABS: 100.0000 mg | ORAL_TABLET | Freq: Three times a day (TID) | ORAL | 0 refills | Status: DC | PRN

## 2017-08-03 NOTE — Progress Notes (Signed)
Office Visit Note   Patient: Nancy [name]           Date of Birth: 10/02/1965           MRN: [id number] Visit Date: 08/03/2017              Requested by: Loletta SpecterGomez, Roger David, PA-C [address] [place], [place] [id number] PCP: Loletta SpecterGomez, Roger David, PA-C   Assessment & Plan: Visit Diagnoses:  1. Unilateral primary osteoarthritis, left hip   2. Unilateral primary osteoarthritis, right hip     Plan: Given the severity of her arthritis I do feel that she is a candidate for hip replacement surgery however we cannot performed at this time until her blood glucose is under better control and she has dental work done due to the infection risk from both of these things.  I talked her length about this and she understands this as well.  We will keep following her from month to month until we see her health status is improved we will decrease her infection risk for this elective surgery.  I do feel the surgery is warranted given the severity of arthritis but we have to take everything into consideration especially the hemoglobin A1c and her teeth.  She is to work on getting these things taken care of.  She says she does follow up with her physician in 3 weeks to continue to follow her blood glucose.  Follow-Up Instructions: Return in about 4 weeks (around 08/31/2017).   Orders:  No orders of the defined types were placed in this encounter.  Meds ordered this encounter  Medications  . traMADol (ULTRAM) 50 MG tablet    Sig: Take 2 tablets (100 mg total) by mouth 3 (three) times daily as needed.    Dispense:  60 tablet    Refill:  0      Procedures: No procedures performed   Clinical Data: No additional findings.   Subjective: Chief Complaint  Patient presents with  . Right Hip - Pain  The patient is only seen before.  She has debilitating arthritis in both her hips with the right being much worse than left.  Recent x-rays confirmed this as well.  Is ambulate with crutches and has  severe pain.  However she is a diabetic and her blood glucose has been under poor control.  Her last hemoglobin A1c injection January was over 14.  She also has significant bad teeth of her lower teeth.  Her pain is daily and is severe.  It is 10 out of 10.  It is detrimentally affecting her active daily living, quality of life, mobility.  Axis R some time ago and had her set up for surgery which she then canceled.  HPI  Review of Systems She currently denies any headache, chest pain, shortness of breath, fever, chills, nausea, vomiting.  Objective: Vital Signs: There were no vitals taken for this visit.  Physical Exam She is alert and oriented x3 and in no acute distress.  She is ambulating with crutches. Ortho Exam Examination of both her hip shows severe pain with any attempts of internal or external rotation.  The external rotation is significant limited on her right hip. Specialty Comments:  No specialty comments available.  Imaging: No results found. X-rays of both hips on the canopy system independently reviewed that were done in November of this past year show severe end-stage arthritis of both her hips.  The right hip is much worse.  The joint  space is obliterated.  There is severe periarticular osteophytes throughout both hips.  PMFS History: Patient Active Problem List   Diagnosis Date Noted  . Unilateral primary osteoarthritis, left hip 08/03/2017  . Unilateral primary osteoarthritis, right hip 08/03/2017  . Type 2 diabetes mellitus (HCC) 07/29/2017  . Primary osteoarthritis of left hip 02/10/2016  . Asthma 12/31/2015  . Hyperlipidemia 10/30/2015  . Depression with anxiety 10/29/2015  . Polysubstance abuse (HCC) 09/04/2015  . Depression 09/04/2015  . Osteoarthritis of right hip 09/04/2015  . Alcohol abuse 12/05/2013  . S/P alcohol detoxification 12/05/2013  . 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 BV (bacterial vaginosis) 11/24/2011  . ASCUS with  positive high risk HPV 11/08/2011   Past Medical History:  Diagnosis Date  . Abnormal Pap smear   . Bronchitis   . Depression   . GERD (gastroesophageal reflux disease)    tums prn  . Headache(784.0)   . Polysubstance abuse (HCC)     Family History  Problem Relation Age of Onset  . Cancer Mother        unsure    Past Surgical History:  Procedure Laterality Date  . CERVICAL CONIZATION W/BX  12/13/2011   Procedure: CONIZATION CERVIX WITH BIOPSY;  Surgeon: Allie Bossier, MD;  Location: [facility];  Service: Gynecology;  Laterality: N/A;  . DENTAL SURGERY     Social History   Occupational History  . Not on file  Tobacco Use  . Smoking status: Current Every Day Smoker    Packs/day: 1.00    Years: 20.00    Pack years: 20.00    Types: Cigarettes  . Smokeless tobacco: Never Used  Substance and Sexual Activity  . Alcohol use: Yes    Alcohol/week: 4.2 oz    Types: 7 Cans of beer per week  . Drug use: Yes    Types: "Crack" cocaine, Marijuana    Comment: Pills(Vicodin) heroin (states no today 09/04/15)  . Sexual activity: Yes    Birth control/protection: None    Comment: Pt in rehab for drug and alcohol use

## 2017-08-04 ENCOUNTER — Other Ambulatory Visit (INDEPENDENT_AMBULATORY_CARE_PROVIDER_SITE_OTHER): Payer: Self-pay | Admitting: Physician Assistant

## 2017-08-04 ENCOUNTER — Telehealth (INDEPENDENT_AMBULATORY_CARE_PROVIDER_SITE_OTHER): Payer: Self-pay | Admitting: Physician Assistant

## 2017-08-04 DIAGNOSIS — R252 Cramp and spasm: Secondary | ICD-10-CM

## 2017-08-04 DIAGNOSIS — J029 Acute pharyngitis, unspecified: Secondary | ICD-10-CM

## 2017-08-04 MED ORDER — NAPROXEN 500 MG PO TABS: 500.0000 mg | ORAL_TABLET | Freq: Two times a day (BID) | ORAL | 0 refills | Status: DC

## 2017-08-04 MED ORDER — CYCLOBENZAPRINE HCL 10 MG PO TABS: 10.0000 mg | ORAL_TABLET | Freq: Every day | ORAL | 0 refills | Status: DC

## 2017-08-04 MED FILL — ?GLIMEPIRIDE 4 MG TABLET: 4 | 30 days supply | Qty: 30 | Fill #1

## 2017-08-04 MED FILL — ESCITALOPRAM 20 MG TABLET: 20 | 30 days supply | Qty: 30 | Fill #1

## 2017-08-04 MED FILL — ?METFORMIN HCL 1,000 MG TAB: 1000 | 30 days supply | Qty: 60 | Fill #1

## 2017-08-04 MED FILL — ?ATORVASTATIN 20MG TABL: 20 | 30 days supply | Qty: 30 | Fill #1

## 2017-08-04 NOTE — Telephone Encounter (Signed)
Patient requesting a refill. Please advise.

## 2017-08-04 NOTE — Telephone Encounter (Signed)
Medication refill sent to [facility]

## 2017-08-04 NOTE — Telephone Encounter (Signed)
Pt called to request a refill for cyclobenzaprine (FLEXERIL) 10 MG tablet  naproxen (NAPROSYN) 500 MG tablet Please sent it to Pharmacy:  [facility] [place] Please follow up

## 2017-08-05 ENCOUNTER — Other Ambulatory Visit (INDEPENDENT_AMBULATORY_CARE_PROVIDER_SITE_OTHER): Payer: Self-pay | Admitting: Physician Assistant

## 2017-08-05 DIAGNOSIS — J029 Acute pharyngitis, unspecified: Secondary | ICD-10-CM

## 2017-08-05 DIAGNOSIS — R252 Cramp and spasm: Secondary | ICD-10-CM

## 2017-08-05 MED FILL — $LANTUS 100 UNITS/ML VIAL: 100 | 28 days supply | Qty: 10 | Fill #1

## 2017-08-05 MED FILL — NAPROXEN 500 MG TABLET: 500 | 15 days supply | Qty: 30 | Fill #0

## 2017-08-05 MED FILL — ?CYCLOBENZAPRINE 10MG TB: 10 | 30 days supply | Qty: 30 | Fill #0

## 2017-08-23 NOTE — Telephone Encounter (Signed)
FWD to PCP. Shyia Fillingim S Dima Ferrufino, CMA  

## 2017-08-24 ENCOUNTER — Telehealth (INDEPENDENT_AMBULATORY_CARE_PROVIDER_SITE_OTHER): Payer: Self-pay | Admitting: Orthopaedic Surgery

## 2017-08-24 ENCOUNTER — Telehealth (INDEPENDENT_AMBULATORY_CARE_PROVIDER_SITE_OTHER): Payer: Self-pay | Admitting: Physician Assistant

## 2017-08-24 NOTE — Telephone Encounter (Signed)
Please advise  Looks like she's asking Dr Lily KocherGomez for Tylenol#3

## 2017-08-24 NOTE — Telephone Encounter (Signed)
Pt called requesting a RX Tylenol 3  To [facility]  Thank You

## 2017-08-24 NOTE — Telephone Encounter (Signed)
Patient called advised she only got part of her Rx (Tramadol) she received 42 tabs. Patient asked if she can get a Rx filled for the rest of the Tabs. The number to contact patient is [phone]

## 2017-08-24 NOTE — Telephone Encounter (Signed)
Returned call to patient left message for a call back  °

## 2017-08-24 NOTE — Telephone Encounter (Signed)
Left message on her machine letting her know below message

## 2017-08-24 NOTE — Telephone Encounter (Signed)
She should only get her pain medication and then through Dr. Lily KocherGomez and not through us.  We cannot have her on tramadol and her also be on Tylenol No. 3.

## 2017-08-24 NOTE — Telephone Encounter (Signed)
FWD to PCP. Tempestt S Roberts, CMA  

## 2017-08-25 ENCOUNTER — Other Ambulatory Visit (INDEPENDENT_AMBULATORY_CARE_PROVIDER_SITE_OTHER): Payer: Self-pay | Admitting: Physician Assistant

## 2017-08-25 NOTE — Telephone Encounter (Signed)
Pt called to check on the refill for her acetaminophen-codeine (TYLENOL #3) 300-30 MG tablet  [facility], please follow up

## 2017-08-25 NOTE — Telephone Encounter (Signed)
FWD to PCP. Tempestt S Roberts, CMA  

## 2017-08-26 NOTE — Telephone Encounter (Signed)
Please call pt and tell her that I do not send refills of opioid medications. Also, Mikle Boswortharlos, I have previously told you that I do not send refills of narcotic pain medicines. Please do not send these types of messages to me or the staff. Simply tell the patient that I do not refill narcotic pain relievers without an office visit. Furthermore, I often tell patients they will receive limited quantities of opioids until they can get to a pain clinic. Thank you.

## 2017-08-29 ENCOUNTER — Ambulatory Visit (INDEPENDENT_AMBULATORY_CARE_PROVIDER_SITE_OTHER): Payer: Self-pay | Admitting: Orthopaedic Surgery

## 2017-08-29 NOTE — Telephone Encounter (Signed)
I called Pt an informed to call us back to schedule appt to get the med refill

## 2017-08-31 ENCOUNTER — Other Ambulatory Visit (INDEPENDENT_AMBULATORY_CARE_PROVIDER_SITE_OTHER): Payer: Self-pay | Admitting: Physician Assistant

## 2017-08-31 DIAGNOSIS — F418 Other specified anxiety disorders: Secondary | ICD-10-CM

## 2017-08-31 DIAGNOSIS — J029 Acute pharyngitis, unspecified: Secondary | ICD-10-CM

## 2017-08-31 DIAGNOSIS — R252 Cramp and spasm: Secondary | ICD-10-CM

## 2017-08-31 MED FILL — ?METFORMIN HCL 1,000 MG TAB: 1000 | 30 days supply | Qty: 60 | Fill #2

## 2017-08-31 MED FILL — ESCITALOPRAM 20 MG TABLET: 20 | 30 days supply | Qty: 30 | Fill #2

## 2017-08-31 MED FILL — $LANTUS 100 UNITS/ML VIAL: 100 | 28 days supply | Qty: 10 | Fill #2

## 2017-08-31 MED FILL — ?ATORVASTATIN 20 MG TABLET: 20 | 30 days supply | Qty: 30 | Fill #2

## 2017-08-31 MED FILL — ?GLIMEPIRIDE 4 MG TABLET: 4 | 30 days supply | Qty: 30 | Fill #2

## 2017-08-31 NOTE — Telephone Encounter (Signed)
FWD to PCP. Demon Volante S Ole Lafon, CMA  

## 2017-09-01 MED FILL — CYCLOBENZAPRINE 10 MG TAB: 10 | 30 days supply | Qty: 30 | Fill #0

## 2017-09-01 MED FILL — NAPROXEN 500 MG TABLET: 500 | 15 days supply | Qty: 30 | Fill #0

## 2017-09-02 ENCOUNTER — Ambulatory Visit (INDEPENDENT_AMBULATORY_CARE_PROVIDER_SITE_OTHER): Payer: Self-pay | Admitting: Physician Assistant

## 2017-09-07 ENCOUNTER — Ambulatory Visit (INDEPENDENT_AMBULATORY_CARE_PROVIDER_SITE_OTHER): Payer: Self-pay | Admitting: Physician Assistant

## 2017-09-07 ENCOUNTER — Encounter (INDEPENDENT_AMBULATORY_CARE_PROVIDER_SITE_OTHER): Payer: Self-pay | Admitting: Physician Assistant

## 2017-09-07 ENCOUNTER — Other Ambulatory Visit: Payer: Self-pay

## 2017-09-07 VITALS — BP 105/72 | HR 83 | Temp 97.8°F | Wt 176.8 lb

## 2017-09-07 DIAGNOSIS — M1611 Unilateral primary osteoarthritis, right hip: Secondary | ICD-10-CM

## 2017-09-07 DIAGNOSIS — K089 Disorder of teeth and supporting structures, unspecified: Secondary | ICD-10-CM

## 2017-09-07 DIAGNOSIS — M19072 Primary osteoarthritis, left ankle and foot: Secondary | ICD-10-CM

## 2017-09-07 DIAGNOSIS — K056 Periodontal disease, unspecified: Secondary | ICD-10-CM

## 2017-09-07 MED ORDER — CHLORHEXIDINE GLUCONATE 0.12 % MT SOLN: 15.0000 mL | Freq: Two times a day (BID) | OROMUCOSAL | 0 refills | Status: DC

## 2017-09-07 MED ORDER — AMOXICILLIN-POT CLAVULANATE 875-125 MG PO TABS: 1.0000 | ORAL_TABLET | Freq: Two times a day (BID) | ORAL | 0 refills | Status: DC

## 2017-09-07 MED ORDER — TRAMADOL HCL 50 MG PO TABS: 50.0000 mg | ORAL_TABLET | Freq: Three times a day (TID) | ORAL | 0 refills | Status: DC

## 2017-09-07 NOTE — Progress Notes (Signed)
Subjective:  Patient ID: Nancy [name], female    DOB: [birth year]  Age: 52 y.o. MRN: [id number]  CC: medication refill    HPI Nancy [name] a 52 y.o.femalewith a medical history of DM2, noncompliance, depression, GERD, HA, polysubstance abuse, abnormal Pap smear, right hip pain, tobacco use, and bronchitis presents with bilateral hip pain. She has run out of Tramadol and Tylenol #3 which were prescribed by orthopedist and PCP respectively 48 days ago. Total number of pills amounted to 21 days of use. Orthopedic specialist has recommended she have total hip replacement starting with the right hip first. However, he will not perform operation until her diabetes is well controlled and her teeth are extracted. Patient has obtained orange card and plans to call [facility] after this visit. Pain in hips are unbearable and has difficulty supporting weight on hips. Pain greater in right hip. Using crutch for ambulation.     Outpatient Medications Prior to Visit  Medication Sig Dispense Refill  . albuterol (PROVENTIL HFA;VENTOLIN HFA) 108 (90 Base) MCG/ACT inhaler Inhale 2 puffs into the lungs every 6 (six) hours as needed for wheezing or shortness of breath. Reported on 12/31/2015 1 each 3  . atorvastatin (LIPITOR) 20 MG tablet Take 1 tablet (20 mg total) by mouth daily. 90 tablet 3  . Blood Glucose Monitoring Suppl (TRUE METRIX METER) DEVI 1 each by Does not apply route 3 (three) times daily before meals. 1 Device 0  . cyclobenzaprine (FLEXERIL) 10 MG tablet TAKE 1 TABLET BY MOUTH AT BEDTIME. 30 tablet 0  . escitalopram (LEXAPRO) 20 MG tablet TAKE 1 TABLET BY MOUTH DAILY. 30 tablet 2  . glimepiride (AMARYL) 4 MG tablet Take 1 tablet (4 mg total) by mouth daily before breakfast. 30 tablet 3  . glucose blood (TRUE METRIX BLOOD GLUCOSE TEST) test strip Use 3 times daily before meals. 100 each 11  . insulin glargine (LANTUS) 100 UNIT/ML injection Inject 0.25 mLs (25 Units total) into  the skin at bedtime. 10 mL 11  . Insulin Syringe-Needle U-100 (INSULIN SYRINGE 1CC/30GX1/2") 30G X 1/2" 1 ML MISC 1 application by Does not apply route daily. 30 each 5  . metFORMIN (GLUCOPHAGE) 1000 MG tablet Take 1 tablet (1,000 mg total) by mouth 2 (two) times daily with a meal. 180 tablet 3  . naproxen (NAPROSYN) 500 MG tablet TAKE 1 TABLET BY MOUTH 2 TIMES DAILY WITH A MEAL. 30 tablet 0  . TRUEPLUS LANCETS 28G MISC 1 each by Does not apply route 3 (three) times daily before meals. 100 each 12  . acetaminophen-codeine (TYLENOL #3) 300-30 MG tablet Take 1 tablet by mouth every 8 (eight) hours as needed for moderate pain. (Patient not taking: Reported on 09/07/2017) 42 tablet 0  . NYSTATIN powder APPLY TOPICALLY 3 TIMES DAILY. (Patient not taking: Reported on 07/08/2017) 45 g 0  . traMADol (ULTRAM) 50 MG tablet Take 2 tablets (100 mg total) by mouth 3 (three) times daily as needed. 60 tablet 0   Facility-Administered Medications Prior to Visit  Medication Dose Route Frequency Provider Last Rate Last Dose  . traMADol (ULTRAM) tablet 50 mg  50 mg Oral Q6H PRN Kathryne Hitch, MD         ROS Review of Systems  Constitutional: Negative for chills, fever and malaise/fatigue.  Eyes: Negative for blurred vision.  Respiratory: Negative for shortness of breath.   Cardiovascular: Negative for chest pain and palpitations.  Gastrointestinal: Negative for abdominal pain and nausea.  Genitourinary: Negative for dysuria and hematuria.  Musculoskeletal: Positive for joint pain. Negative for myalgias.  Skin: Negative for rash.  Neurological: Negative for tingling and headaches.  Psychiatric/Behavioral: Negative for depression. The patient is not nervous/anxious.     Objective:  BP 105/72 (BP Location: Right Arm, Patient Position: Sitting, Cuff Size: Normal)   Pulse 83   Temp 97.8 F (36.6 C) (Oral)   Wt 176 lb 12.8 oz (80.2 kg)   SpO2 96%   BMI 26.88 kg/m   BP/Weight 09/07/2017 07/29/2017  07/08/2017  Systolic BP 105 - 118  Diastolic BP 72 - 85  Wt. (Lbs) 176.8 163 161.2  BMI 26.88 24.78 24.51      Physical Exam  Constitutional: She is oriented to person, place, and time.  Well developed, well nourished, NAD, polite, using single crutch for ambulation.  HENT:  Head: Normocephalic and atraumatic.  Eyes: No scleral icterus.  Neck: Normal range of motion. Neck supple. No thyromegaly present.  Cardiovascular: Normal rate, regular rhythm and normal heart sounds.  Pulmonary/Chest: Effort normal and breath sounds normal.  Abdominal: Soft. Bowel sounds are normal. There is no tenderness.  Musculoskeletal: She exhibits no edema.  Moderately severe limited aROM of Right hip 2/2 pain.   Neurological: She is alert and oriented to person, place, and time.  Skin: Skin is warm and dry. No rash noted. No erythema. No pallor.  Psychiatric: She has a normal mood and affect. Her behavior is normal. Thought content normal.  Vitals reviewed.    Assessment & Plan:    1. Primary osteoarthritis of right hip - traMADol (ULTRAM) 50 MG tablet; Take 1 tablet (50 mg total) by mouth every 8 (eight) hours.  Dispense: 90 tablet; Refill: 0  2. Primary osteoarthritis of left ankle - traMADol (ULTRAM) 50 MG tablet; Take 1 tablet (50 mg total) by mouth every 8 (eight) hours.  Dispense: 90 tablet; Refill: 0  3. Poor dentition - Ambulatory referral to Dentistry - amoxicillin-clavulanate (AUGMENTIN) 875-125 MG tablet; Take 1 tablet by mouth 2 (two) times daily.  Dispense: 20 tablet; Refill: 0  4. Periodontal disease - Ambulatory referral to Dentistry - chlorhexidine (PERIDEX) 0.12 % solution; Use as directed 15 mLs in the mouth or throat 2 (two) times daily.  Dispense: 120 mL; Refill: 0 - amoxicillin-clavulanate (AUGMENTIN) 875-125 MG tablet; Take 1 tablet by mouth 2 (two) times daily.  Dispense: 20 tablet; Refill: 0   Meds ordered this encounter  Medications  . traMADol (ULTRAM) 50 MG tablet     Sig: Take 1 tablet (50 mg total) by mouth every 8 (eight) hours.    Dispense:  90 tablet    Refill:  0    Order Specific Question:   Supervising Provider    Answer:   Quentin Angst [id number]  . chlorhexidine (PERIDEX) 0.12 % solution    Sig: Use as directed 15 mLs in the mouth or throat 2 (two) times daily.    Dispense:  120 mL    Refill:  0    Order Specific Question:   Supervising Provider    Answer:   Quentin Angst [id number]  . amoxicillin-clavulanate (AUGMENTIN) 875-125 MG tablet    Sig: Take 1 tablet by mouth 2 (two) times daily.    Dispense:  20 tablet    Refill:  0    Order Specific Question:   Supervising Provider    Answer:   Quentin Angst [id number]    Follow-up: Return in about 4 weeks (around  10/05/2017) for DM and A1c.   Loletta Specteroger David Lyman Balingit PA

## 2017-09-07 NOTE — Patient Instructions (Signed)
Joint Pain Joint pain can be caused by many things. The joint can be bruised, infected, weak from aging, or sore from exercise. The pain will probably go away if you follow your doctor's instructions for home care. If your joint pain continues, more tests may be needed to help find the cause of your condition. Follow these instructions at home: Watch your condition for any changes. Follow these instructions as told to lessen the pain that you are feeling:  Take medicines only as told by your doctor.  Rest the sore joint for as long as told by your doctor. If your doctor tells you to, raise (elevate) the painful joint above the level of your heart while you are sitting or lying down.  Do not do things that cause pain or make the pain worse.  If told, put ice on the painful area: ? Put ice in a plastic bag. ? Place a towel between your skin and the bag. ? Leave the ice on for 20 minutes, 2-3 times per day.  Wear an elastic bandage, splint, or sling as told by your doctor. Loosen the bandage or splint if your fingers or toes lose feeling (become numb) and tingle, or if they turn cold and blue.  Begin exercising or stretching the joint as told by your doctor. Ask your doctor what types of exercise are safe for you.  Keep all follow-up visits as told by your doctor. This is important.  Contact a doctor if:  Your pain gets worse and medicine does not help it.  Your joint pain does not get better in 3 days.  You have more bruising or swelling.  You have a fever.  You lose 10 pounds (4.5 kg) or more without trying. Get help right away if:  You are not able to move the joint.  Your fingers or toes become numb or they turn cold and blue. This information is not intended to replace advice given to you by your health care provider. Make sure you discuss any questions you have with your health care provider. Document Released: 05/26/2009 Document Revised: 11/13/2015 Document Reviewed:  03/19/2014 Elsevier Interactive Patient Education  2018 Elsevier Inc.  

## 2017-09-08 ENCOUNTER — Other Ambulatory Visit (INDEPENDENT_AMBULATORY_CARE_PROVIDER_SITE_OTHER): Payer: Self-pay | Admitting: Physician Assistant

## 2017-09-08 DIAGNOSIS — K056 Periodontal disease, unspecified: Secondary | ICD-10-CM

## 2017-09-08 MED ORDER — CHLORHEXIDINE GLUCONATE 0.12 % MT SOLN: 15.0000 mL | Freq: Two times a day (BID) | OROMUCOSAL | 0 refills | Status: DC

## 2017-09-21 MED FILL — CHLORHEXIDINE 0.12% RINSE: 0.12 | 15 days supply | Qty: 473 | Fill #0

## 2017-09-21 MED FILL — AMOX-CLAV 875-125 MG TABLET: 875-125 | 10 days supply | Qty: 20 | Fill #0

## 2017-09-21 MED FILL — traMADol HCL 50 MG TABS: 50 | 30 days supply | Qty: 90 | Fill #0

## 2017-09-26 ENCOUNTER — Ambulatory Visit (INDEPENDENT_AMBULATORY_CARE_PROVIDER_SITE_OTHER): Payer: Self-pay | Admitting: Physician Assistant

## 2017-10-03 ENCOUNTER — Ambulatory Visit (INDEPENDENT_AMBULATORY_CARE_PROVIDER_SITE_OTHER): Payer: Self-pay | Admitting: Nurse Practitioner

## 2017-11-02 ENCOUNTER — Other Ambulatory Visit: Payer: Self-pay | Admitting: Nurse Practitioner

## 2017-11-02 ENCOUNTER — Other Ambulatory Visit: Payer: Self-pay | Admitting: Family Medicine

## 2017-11-02 ENCOUNTER — Other Ambulatory Visit (INDEPENDENT_AMBULATORY_CARE_PROVIDER_SITE_OTHER): Payer: Self-pay | Admitting: Physician Assistant

## 2017-11-02 DIAGNOSIS — M1611 Unilateral primary osteoarthritis, right hip: Secondary | ICD-10-CM

## 2017-11-02 DIAGNOSIS — M19072 Primary osteoarthritis, left ankle and foot: Secondary | ICD-10-CM

## 2017-11-02 DIAGNOSIS — J029 Acute pharyngitis, unspecified: Secondary | ICD-10-CM

## 2017-11-02 DIAGNOSIS — R252 Cramp and spasm: Secondary | ICD-10-CM

## 2017-11-02 DIAGNOSIS — J4520 Mild intermittent asthma, uncomplicated: Secondary | ICD-10-CM

## 2017-11-02 MED ORDER — ACCU-CHEK AVIVA DEVI: 0 refills | Status: DC

## 2017-11-02 MED ORDER — ACCU-CHEK SOFT TOUCH LANCETS MISC: 12 refills | Status: DC

## 2017-11-02 MED ORDER — GLUCOSE BLOOD VI STRP: ORAL_STRIP | 12 refills | Status: DC

## 2017-11-02 NOTE — Telephone Encounter (Signed)
FWD to covering provider at [facility]. Darlyn Repsher S Kaysey Berndt, CMA  

## 2017-11-04 MED FILL — traMADol HCL 50 MG TABS: 50 | 7 days supply | Qty: 21 | Fill #0

## 2017-11-04 MED FILL — PROAIR HFA 90 MCG INHALER: 108 (90 BAS | 25 days supply | Qty: 9 | Fill #0

## 2017-11-04 MED FILL — NAPROXEN 500 MG TABLET: 500 | 15 days supply | Qty: 30 | Fill #0

## 2017-11-04 MED FILL — CYCLOBENZAPRINE 10 MG TAB: 10 | 30 days supply | Qty: 30 | Fill #0

## 2017-11-11 MED FILL — ACCU-CHEK SOFTCLIX LANCETS: 25 days supply | Qty: 100 | Fill #0

## 2017-11-11 MED FILL — ACCU-CHEK AVIVA PLUS METER: W/DEVICE | 30 days supply | Qty: 1 | Fill #0

## 2017-11-11 MED FILL — ESCITALOPRAM 20 MG TABLET: 20 | 30 days supply | Qty: 30 | Fill #0

## 2017-11-11 MED FILL — ACCU-CHEK AVIVA PLUS TEST S: 25 days supply | Qty: 100 | Fill #0

## 2017-11-28 ENCOUNTER — Ambulatory Visit (INDEPENDENT_AMBULATORY_CARE_PROVIDER_SITE_OTHER): Payer: Self-pay | Admitting: Physician Assistant

## 2018-01-05 ENCOUNTER — Other Ambulatory Visit (INDEPENDENT_AMBULATORY_CARE_PROVIDER_SITE_OTHER): Payer: Self-pay | Admitting: Nurse Practitioner

## 2018-01-05 DIAGNOSIS — J029 Acute pharyngitis, unspecified: Secondary | ICD-10-CM

## 2018-01-05 DIAGNOSIS — R252 Cramp and spasm: Secondary | ICD-10-CM

## 2018-01-05 MED FILL — ATORVASTATIN 20 MG TABLET: 20 | 30 days supply | Qty: 30 | Fill #3

## 2018-01-05 MED FILL — traMADol HCL 50 MG TABS: 50 | 7 days supply | Qty: 21 | Fill #1

## 2018-01-05 MED FILL — ESCITALOPRAM 20 MG TABLET: 20 | 30 days supply | Qty: 30 | Fill #1

## 2018-01-05 MED FILL — metFORMIN HCL 1000 MG TABS: 1000 | 30 days supply | Qty: 60 | Fill #3

## 2018-01-06 ENCOUNTER — Other Ambulatory Visit: Payer: Self-pay | Admitting: Pharmacist

## 2018-01-06 MED ORDER — NAPROXEN 500 MG PO TABS: 500.0000 mg | ORAL_TABLET | Freq: Two times a day (BID) | ORAL | 0 refills | Status: DC

## 2018-01-06 MED ORDER — CYCLOBENZAPRINE HCL 10 MG PO TABS: 10.0000 mg | ORAL_TABLET | Freq: Every day | ORAL | 0 refills | Status: DC

## 2018-01-06 MED FILL — CYCLOBENZAPRINE 10 MG TAB: 10 | 30 days supply | Qty: 30 | Fill #0

## 2018-01-06 MED FILL — NAPROXEN 500 MG TABLET: 500 | 15 days supply | Qty: 30 | Fill #0

## 2018-01-06 NOTE — Progress Notes (Signed)
Pt requesting refills with last OV 09/07/17 with Fredrik Coveoger. Will give short-term supply with future refills to be approved by him.

## 2018-02-14 ENCOUNTER — Ambulatory Visit (INDEPENDENT_AMBULATORY_CARE_PROVIDER_SITE_OTHER): Payer: Self-pay | Admitting: Physician Assistant

## 2018-04-03 ENCOUNTER — Other Ambulatory Visit: Payer: Self-pay | Admitting: Family Medicine

## 2018-04-13 ENCOUNTER — Ambulatory Visit: Payer: Self-pay

## 2018-07-15 ENCOUNTER — Emergency Department (HOSPITAL_COMMUNITY)
Admission: EM | Admit: 2018-07-15 | Discharge: 2018-07-15 | Disposition: A | Discharge status: Home / Self Care | Payer: Medicaid Other | Attending: Emergency Medicine | Admitting: Emergency Medicine

## 2018-07-15 ENCOUNTER — Emergency Department (HOSPITAL_COMMUNITY): Payer: Medicaid Other

## 2018-07-15 ENCOUNTER — Encounter (HOSPITAL_COMMUNITY): Payer: Self-pay | Admitting: Emergency Medicine

## 2018-07-15 DIAGNOSIS — Z794 Long term (current) use of insulin: Secondary | ICD-10-CM | POA: Insufficient documentation

## 2018-07-15 DIAGNOSIS — F329 Major depressive disorder, single episode, unspecified: Secondary | ICD-10-CM | POA: Insufficient documentation

## 2018-07-15 DIAGNOSIS — F17210 Nicotine dependence, cigarettes, uncomplicated: Secondary | ICD-10-CM | POA: Diagnosis not present

## 2018-07-15 DIAGNOSIS — Y929 Unspecified place or not applicable: Secondary | ICD-10-CM | POA: Insufficient documentation

## 2018-07-15 DIAGNOSIS — Y9389 Activity, other specified: Secondary | ICD-10-CM | POA: Diagnosis not present

## 2018-07-15 DIAGNOSIS — Y998 Other external cause status: Secondary | ICD-10-CM | POA: Insufficient documentation

## 2018-07-15 DIAGNOSIS — W19XXXA Unspecified fall, initial encounter: Secondary | ICD-10-CM

## 2018-07-15 DIAGNOSIS — G8929 Other chronic pain: Secondary | ICD-10-CM

## 2018-07-15 DIAGNOSIS — M25552 Pain in left hip: Secondary | ICD-10-CM | POA: Insufficient documentation

## 2018-07-15 DIAGNOSIS — E1165 Type 2 diabetes mellitus with hyperglycemia: Secondary | ICD-10-CM | POA: Insufficient documentation

## 2018-07-15 DIAGNOSIS — R739 Hyperglycemia, unspecified: Secondary | ICD-10-CM

## 2018-07-15 DIAGNOSIS — E119 Type 2 diabetes mellitus without complications: Secondary | ICD-10-CM

## 2018-07-15 DIAGNOSIS — M25551 Pain in right hip: Secondary | ICD-10-CM

## 2018-07-15 DIAGNOSIS — W010XXA Fall on same level from slipping, tripping and stumbling without subsequent striking against object, initial encounter: Secondary | ICD-10-CM | POA: Diagnosis not present

## 2018-07-15 HISTORY — DX: Type 2 diabetes mellitus without complications: E11.9

## 2018-07-15 LAB — URINALYSIS, ROUTINE W REFLEX MICROSCOPIC
Bilirubin Urine: NEGATIVE
Glucose, UA: >=500 mg/dL — AB
Ketones, ur: NEGATIVE mg/dL
Nitrite: NEGATIVE
Protein, ur: NEGATIVE mg/dL
Specific Gravity, Urine: 1.017 (ref 1.005–1.030)
pH: 5 (ref 5.0–8.0)

## 2018-07-15 LAB — BASIC METABOLIC PANEL
Anion gap: 13 (ref 5–15)
BUN: 10 mg/dL (ref 6–20)
CO2: 21 mmol/L — ABNORMAL LOW (ref 22–32)
Calcium: 9 mg/dL (ref 8.9–10.3)
Chloride: 97 mmol/L — ABNORMAL LOW (ref 98–111)
Creatinine, Ser: 0.78 mg/dL (ref 0.44–1.00)
GFR calc Af Amer: >60 mL/min (ref >60)
GFR calc non Af Amer: >60 mL/min (ref >60)
Glucose, Bld: 543 mg/dL (ref 70–99)
Potassium: 3.1 mmol/L — ABNORMAL LOW (ref 3.5–5.1)
Sodium: 131 mmol/L — ABNORMAL LOW (ref 135–145)

## 2018-07-15 LAB — CBG MONITORING, ED
Glucose-Capillary: 538 mg/dL (ref 70–99)
Glucose-Capillary: 483 mg/dL — ABNORMAL HIGH (ref 70–99)
Glucose-Capillary: 315 mg/dL — ABNORMAL HIGH (ref 70–99)
Glucose-Capillary: 293 mg/dL — ABNORMAL HIGH (ref 70–99)
Glucose-Capillary: 230 mg/dL — ABNORMAL HIGH (ref 70–99)

## 2018-07-15 LAB — CBC
HCT: 41.2 % (ref 36.0–46.0)
Hemoglobin: 13.5 g/dL (ref 12.0–15.0)
MCH: 30.6 pg (ref 26.0–34.0)
MCHC: 32.8 g/dL (ref 30.0–36.0)
MCV: 93.4 fL (ref 80.0–100.0)
Platelets: 288 10*3/uL (ref 150–400)
RBC: 4.41 MIL/uL (ref 3.87–5.11)
RDW: 12.1 % (ref 11.5–15.5)
WBC: 13.2 10*3/uL — ABNORMAL HIGH (ref 4.0–10.5)
nRBC: 0 % (ref 0.0–0.2)

## 2018-07-15 LAB — RAPID URINE DRUG SCREEN, HOSP PERFORMED
Amphetamines: NOT DETECTED
Barbiturates: NOT DETECTED
Benzodiazepines: NOT DETECTED
Cocaine: POSITIVE — AB
Opiates: NOT DETECTED
Tetrahydrocannabinol: NOT DETECTED

## 2018-07-15 LAB — I-STAT BETA HCG BLOOD, ED (MC, WL, AP ONLY): I-stat hCG, quantitative: <5 m[IU]/mL (ref <5)

## 2018-07-15 MED ORDER — "INSULIN SYRINGE 30G X 1/2"" 1 ML MISC": 1.0000 "application " | Freq: Every day | 0 refills | Status: DC

## 2018-07-15 MED ORDER — METFORMIN HCL 1000 MG PO TABS: 1000.0000 mg | ORAL_TABLET | Freq: Two times a day (BID) | ORAL | 0 refills | Status: DC

## 2018-07-15 MED ORDER — INSULIN GLARGINE 100 UNIT/ML ~~LOC~~ SOLN: 25.0000 [IU] | Freq: Two times a day (BID) | SUBCUTANEOUS | 1 refills | Status: DC

## 2018-07-15 MED ORDER — KETOROLAC TROMETHAMINE 30 MG/ML IJ SOLN
30.0000 mg | Freq: Once | INTRAMUSCULAR | Status: AC
  Administered 2018-07-15: 30 mg via INTRAVENOUS
  Filled 2018-07-15: qty 1

## 2018-07-15 MED ORDER — SODIUM CHLORIDE 0.9 % IV BOLUS
1000.0000 mL | Freq: Once | INTRAVENOUS | Status: AC
  Administered 2018-07-15: 1000 mL via INTRAVENOUS

## 2018-07-15 MED ORDER — INSULIN REGULAR(HUMAN) IN NACL 100-0.9 UT/100ML-% IV SOLN
INTRAVENOUS | Status: DC
  Administered 2018-07-15: 4.2 [IU]/h via INTRAVENOUS
  Filled 2018-07-15: qty 100
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
## 2018-07-15 NOTE — ED Triage Notes (Signed)
Pt arrives ems, fall etoh, with hypergylcemia  Pt not compliant on medicines and blood sugars per ems, v/s on bp 116/74 hr 90, cbg high. possible drug use. Alert  Pt was combative w ems, pulled out IV and bleeding controlled

## 2018-07-15 NOTE — ED Notes (Addendum)
Date and time results received: 07/15/18 0431   Test: Glucose Critical Value: 543  Name of Provider Notified: Dr. Read Drivers  Orders Received? Or Actions Taken?: Insulin drip to be started

## 2018-07-15 NOTE — ED Notes (Signed)
Patient arrived by EMS with clothing on person, small clutch purse, red inhaler and a cell phone

## 2018-07-15 NOTE — ED Notes (Signed)
Bed: OZ30 Expected date:  Expected time:  Means of arrival:  Comments: EMS 53 yo female fell outside-hip pain-no shortening or rotation-CBG reading high-non-compliant with medications/having anxiety

## 2018-07-19 ENCOUNTER — Other Ambulatory Visit: Payer: Self-pay | Admitting: Nurse Practitioner

## 2018-07-19 MED FILL — CYCLOBENZAPRINE 10 MG TAB: 10 | 30 days supply | Qty: 30 | Fill #0

## 2018-07-19 MED FILL — TRUEPLUS SYR 1ML 30GX5/16": 30G X 5/16" | 25 days supply | Qty: 100 | Fill #0

## 2018-07-19 MED FILL — metFORMIN HCL 1000 MG TABS: 1000 | 30 days supply | Qty: 60 | Fill #0

## 2018-07-19 MED FILL — ACCU-CHEK SOFTCLIX LANCETS: 25 days supply | Qty: 100 | Fill #1

## 2018-07-19 MED FILL — !LANTUS 100 UNITS/ML VIAL: 100 | 20 days supply | Qty: 10 | Fill #0

## 2018-07-19 MED FILL — NAPROXEN 500 MG TABLET: 500 | 15 days supply | Qty: 30 | Fill #0

## 2018-07-19 MED FILL — ESCITALOPRAM 20 MG TABLET: 20 | 30 days supply | Qty: 30 | Fill #2

## 2018-07-19 MED FILL — TRUEPLUS SYR 1ML 30GX5/16: 30G X 5/16" | 25 days supply | Qty: 100 | Fill #0

## 2019-02-08 ENCOUNTER — Other Ambulatory Visit: Payer: Self-pay

## 2019-02-08 ENCOUNTER — Emergency Department (HOSPITAL_COMMUNITY)
Admission: EM | Admit: 2019-02-08 | Discharge: 2019-02-09 | Disposition: A | Discharge status: Home / Self Care | Payer: Medicaid Other | Attending: Emergency Medicine | Admitting: Emergency Medicine

## 2019-02-08 ENCOUNTER — Emergency Department (HOSPITAL_COMMUNITY): Payer: Medicaid Other

## 2019-02-08 ENCOUNTER — Encounter (HOSPITAL_COMMUNITY): Payer: Self-pay

## 2019-02-08 DIAGNOSIS — Y999 Unspecified external cause status: Secondary | ICD-10-CM | POA: Diagnosis not present

## 2019-02-08 DIAGNOSIS — Z794 Long term (current) use of insulin: Secondary | ICD-10-CM | POA: Diagnosis not present

## 2019-02-08 DIAGNOSIS — N739 Female pelvic inflammatory disease, unspecified: Secondary | ICD-10-CM | POA: Insufficient documentation

## 2019-02-08 DIAGNOSIS — N766 Ulceration of vulva: Secondary | ICD-10-CM

## 2019-02-08 DIAGNOSIS — Y939 Activity, unspecified: Secondary | ICD-10-CM | POA: Diagnosis not present

## 2019-02-08 DIAGNOSIS — M25551 Pain in right hip: Secondary | ICD-10-CM | POA: Insufficient documentation

## 2019-02-08 DIAGNOSIS — N730 Acute parametritis and pelvic cellulitis: Secondary | ICD-10-CM

## 2019-02-08 DIAGNOSIS — W19XXXA Unspecified fall, initial encounter: Secondary | ICD-10-CM

## 2019-02-08 DIAGNOSIS — E119 Type 2 diabetes mellitus without complications: Secondary | ICD-10-CM | POA: Diagnosis not present

## 2019-02-08 DIAGNOSIS — Z760 Encounter for issue of repeat prescription: Secondary | ICD-10-CM | POA: Diagnosis not present

## 2019-02-08 DIAGNOSIS — W1830XA Fall on same level, unspecified, initial encounter: Secondary | ICD-10-CM | POA: Diagnosis not present

## 2019-02-08 DIAGNOSIS — F17210 Nicotine dependence, cigarettes, uncomplicated: Secondary | ICD-10-CM | POA: Insufficient documentation

## 2019-02-08 DIAGNOSIS — G8929 Other chronic pain: Secondary | ICD-10-CM | POA: Diagnosis not present

## 2019-02-08 DIAGNOSIS — Y929 Unspecified place or not applicable: Secondary | ICD-10-CM | POA: Diagnosis not present

## 2019-02-08 LAB — CBG MONITORING, ED: Glucose-Capillary: 392 mg/dL — ABNORMAL HIGH (ref 70–99)
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
## 2019-02-09 ENCOUNTER — Encounter (HOSPITAL_COMMUNITY): Payer: Self-pay | Admitting: Emergency Medicine

## 2019-02-09 LAB — WET PREP, GENITAL
Sperm: NONE SEEN
Trich, Wet Prep: NONE SEEN
Yeast Wet Prep HPF POC: NONE SEEN

## 2019-02-09 MED ORDER — ALBUTEROL SULFATE HFA 108 (90 BASE) MCG/ACT IN AERS: 1.0000 | INHALATION_SPRAY | Freq: Four times a day (QID) | RESPIRATORY_TRACT | 0 refills | Status: DC | PRN

## 2019-02-09 MED ORDER — STERILE WATER FOR INJECTION IJ SOLN
INTRAMUSCULAR | Status: AC
  Administered 2019-02-09: 10 mL
  Filled 2019-02-09: qty 10

## 2019-02-09 MED ORDER — CEFTRIAXONE SODIUM 250 MG IJ SOLR
250.0000 mg | Freq: Once | INTRAMUSCULAR | Status: AC
  Administered 2019-02-09: 250 mg via INTRAMUSCULAR
  Filled 2019-02-09: qty 250

## 2019-02-09 MED ORDER — DOXYCYCLINE HYCLATE 100 MG PO CAPS: 100.0000 mg | ORAL_CAPSULE | Freq: Two times a day (BID) | ORAL | 0 refills | Status: DC

## 2019-02-09 MED ORDER — NAPROXEN 375 MG PO TABS: 375.0000 mg | ORAL_TABLET | Freq: Two times a day (BID) | ORAL | 0 refills | Status: DC

## 2019-02-09 MED ORDER — ACETAMINOPHEN 500 MG PO TABS
1000.0000 mg | ORAL_TABLET | Freq: Once | ORAL | Status: AC
  Administered 2019-02-09: 1000 mg via ORAL
  Filled 2019-02-09: qty 2

## 2019-02-09 MED ORDER — NAPROXEN 250 MG PO TABS
500.0000 mg | ORAL_TABLET | ORAL | Status: AC
  Administered 2019-02-09: 500 mg via ORAL
  Filled 2019-02-09: qty 2

## 2019-02-09 MED ORDER — METRONIDAZOLE 500 MG PO TABS
2000.0000 mg | ORAL_TABLET | Freq: Once | ORAL | Status: AC
  Administered 2019-02-09: 2000 mg via ORAL
  Filled 2019-02-09: qty 4

## 2019-02-09 MED ORDER — AZITHROMYCIN 1 G PO PACK
1.0000 g | PACK | Freq: Once | ORAL | Status: AC
  Administered 2019-02-09: 1 g via ORAL
  Filled 2019-02-09: qty 1

## 2019-02-09 MED FILL — VENTOLIN HFA 90 MCG INHALER: 108 (90 BAS | 25 days supply | Qty: 18 | Fill #0

## 2019-02-09 MED FILL — NAPROXEN 375 MG TABLET: 375 | 7 days supply | Qty: 14 | Fill #0

## 2019-02-09 NOTE — ED Provider Notes (Signed)
[facility] EMERGENCY DEPARTMENT Provider Note   CSN: [id number] Arrival date & time: 02/08/19  [place]     History   Chief Complaint Chief Complaint  Patient presents with  . Hip Pain    HPI Nancy [name] is a 53 y.o. female.     The history is provided by the patient.  Hip Pain This is a chronic problem. The current episode started more than 1 week ago. The problem occurs constantly. The problem has not changed since onset.Pertinent negatives include no chest pain, no abdominal pain, no headaches and no shortness of breath. Nothing aggravates the symptoms. Nothing relieves the symptoms. She has tried nothing for the symptoms. The treatment provided no relief.  Vaginal Discharge Quality:  Mucopurulent Severity:  Severe Onset quality:  Gradual Timing:  Constant Progression:  Worsening Chronicity:  Recurrent Context: spontaneously   Relieved by:  Nothing Worsened by:  Nothing Ineffective treatments:  None tried Associated symptoms: genital lesions   Associated symptoms: no abdominal pain, no dysuria and no fever   Risk factors: STI   Patient with a h/o syphilis and polysubstance abuse who presents with a genital lesion that she was messing with and this caused her to fall.  She is also having vaginal discharge which is green and copious.  No f/c/r.    Past Medical History:  Diagnosis Date  . Abnormal Pap smear   . Bronchitis   . Depression   . Diabetes ([place])   . GERD (gastroesophageal reflux disease)    tums prn  . Headache(784.0)   . Polysubstance abuse [facility])     Patient Active Problem List   Diagnosis Date Noted  . Unilateral primary osteoarthritis, left hip 08/03/2017  . Unilateral primary osteoarthritis, right hip 08/03/2017  . Type 2 diabetes mellitus ([place]) 07/29/2017  . Primary osteoarthritis of left hip 02/10/2016  . Asthma 12/31/2015  . Hyperlipidemia 10/30/2015  . Depression with anxiety 10/29/2015  . Polysubstance abuse ([place])  09/04/2015  . Depression 09/04/2015  . Osteoarthritis of right hip 09/04/2015  . Alcohol abuse 12/05/2013  . S/P alcohol detoxification 12/05/2013  . 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 BV (bacterial vaginosis) 11/24/2011  . ASCUS with positive high risk HPV 11/08/2011    Past Surgical History:  Procedure Laterality Date  . CERVICAL CONIZATION W/BX  12/13/2011   Procedure: CONIZATION CERVIX WITH BIOPSY;  Surgeon: Emily Filbert, MD;  Location: [facility];  Service: Gynecology;  Laterality: N/A;  . DENTAL SURGERY       OB History    Gravida  1   Para  1   Term  1   Preterm      AB      Living  1     SAB      TAB      Ectopic      Multiple      Live Births               Home Medications    Prior to Admission medications   Medication Sig Start Date End Date Taking? Authorizing Provider  Blood Glucose Monitoring Suppl (ACCU-CHEK AVIVA PLUS) w/Device KIT USE AS INSTRUCTED 07/19/18   Charlott Rakes, MD  doxycycline (VIBRAMYCIN) 100 MG capsule Take 1 capsule (100 mg total) by mouth 2 (two) times daily. 02/09/19   Palumbo, April, MD  insulin glargine (LANTUS) 100 UNIT/ML injection Inject 0.25 mLs (25 Units total) into the skin 2 (two) times daily.  07/15/18   Molpus, John, MD  Insulin Syringe-Needle U-100 (INSULIN SYRINGE 1CC/30GX1/2") 30G X 1/2" 1 ML MISC 1 application by Does not apply route daily. 07/15/18   Molpus, Jenny Reichmann, MD  metFORMIN (GLUCOPHAGE) 1000 MG tablet Take 1 tablet (1,000 mg total) by mouth 2 (two) times daily with a meal. 07/15/18   Molpus, John, MD  naproxen (NAPROSYN) 375 MG tablet Take 1 tablet (375 mg total) by mouth 2 (two) times daily with a meal. 02/09/19   Odeth Bry, MD    Family History Family History  Problem Relation Age of Onset  . Cancer Mother        unsure    Social History Social History   Tobacco Use  . Smoking status: Current Every Day Smoker    Packs/day: 1.00    Years: 20.00    Pack years: 20.00    Types:  Cigarettes  . Smokeless tobacco: Never Used  Substance Use Topics  . Alcohol use: Yes    Alcohol/week: 7.0 standard drinks    Types: 7 Cans of beer per week  . Drug use: Yes    Types: "Crack" cocaine, Marijuana    Comment: Pills(Vicodin) heroin (states no today 09/04/15)     Allergies   No known allergies   Review of Systems Review of Systems  Constitutional: Negative for fever.  HENT: Negative for congestion.   Eyes: Negative for visual disturbance.  Respiratory: Negative for shortness of breath.   Cardiovascular: Negative for chest pain.  Gastrointestinal: Negative for abdominal pain.  Genitourinary: Positive for genital sores and vaginal discharge. Negative for dysuria.  Musculoskeletal: Negative for arthralgias and back pain.  Neurological: Negative for facial asymmetry, speech difficulty, weakness, numbness and headaches.  Psychiatric/Behavioral: Negative for agitation.  All other systems reviewed and are negative.    Physical Exam Updated Vital Signs BP 119/85   Pulse 98   Temp 98.4 F (36.9 C) (Oral)   Resp 18   Ht 5' 5" (1.651 m)   Wt 74.8 kg   SpO2 99%   BMI 27.46 kg/m   Physical Exam Vitals signs and nursing note reviewed. Exam conducted with a chaperone present.  Constitutional:      General: She is not in acute distress.    Appearance: She is normal weight.  HENT:     Head: Normocephalic.     Nose: Nose normal.  Eyes:     Conjunctiva/sclera: Conjunctivae normal.     Pupils: Pupils are equal, round, and reactive to light.  Neck:     Musculoskeletal: Normal range of motion and neck supple.  Cardiovascular:     Rate and Rhythm: Normal rate and regular rhythm.     Pulses: Normal pulses.     Heart sounds: Normal heart sounds.  Pulmonary:     Effort: Pulmonary effort is normal.     Breath sounds: Normal breath sounds.  Abdominal:     General: Abdomen is flat. Bowel sounds are normal.     Tenderness: There is no abdominal tenderness. There is no  guarding or rebound.  Genitourinary:    Vagina: Vaginal discharge present.     Cervix: Cervical motion tenderness present.    Neurological:     Mental Status: She is alert.      ED Treatments / Results  Labs (all labs ordered are listed, but only abnormal results are displayed) Results for orders placed or performed during the hospital encounter of 02/08/19  CBG monitoring, ED  Result Value Ref Range  Glucose-Capillary 392 (H) 70 - 99 mg/dL   Dg Hip Unilat  With Pelvis 2-3 Views Right  Result Date: 02/08/2019 CLINICAL DATA:  Status post fall today with right hip pain. EXAM: DG HIP (WITH OR WITHOUT PELVIS) 2-3V RIGHT COMPARISON:  July 15, 2018 FINDINGS: There is no evidence of hip fracture or dislocation. Advanced osteoarthritis of with further flattening weight bearing portion right femoral head are noted. Degenerative joint changes of spine are. IMPRESSION: No acute fracture or dislocation. Chronic change the right femoral head as described. Electronically Signed   By: Abelardo Diesel M.D.   On: 02/08/2019 20:02    EKG None  Radiology Dg Hip Unilat  With Pelvis 2-3 Views Right  Result Date: 02/08/2019 CLINICAL DATA:  Status post fall today with right hip pain. EXAM: DG HIP (WITH OR WITHOUT PELVIS) 2-3V RIGHT COMPARISON:  July 15, 2018 FINDINGS: There is no evidence of hip fracture or dislocation. Advanced osteoarthritis of with further flattening weight bearing portion right femoral head are noted. Degenerative joint changes of spine are. IMPRESSION: No acute fracture or dislocation. Chronic change the right femoral head as described. Electronically Signed   By: Abelardo Diesel M.D.   On: 02/08/2019 20:02    Procedures Procedures (including critical care time)  Medications Ordered in ED Medications  naproxen (NAPROSYN) tablet 500 mg (has no administration in time range)  acetaminophen (TYLENOL) tablet 1,000 mg (has no administration in time range)  cefTRIAXone (ROCEPHIN)  injection 250 mg (has no administration in time range)  azithromycin (ZITHROMAX) powder 1 g (has no administration in time range)  metroNIDAZOLE (FLAGYL) tablet 2,000 mg (has no administration in time range)    Clearly PID, have treated. Will start doxycycline for 14 days and have [facility].  RPR sent given lesion and previous history  Final Clinical Impressions(s) / ED Diagnoses   Final diagnoses:  PID (acute pelvic inflammatory disease)  Fall, initial encounter  Genital ulcer, female   Return for intractable cough, coughing up blood,fevers >100.4 unrelieved by medication, shortness of breath, intractable vomiting, chest pain, shortness of breath, weakness,numbness, changes in speech, facial asymmetry,abdominal pain, passing out,Inability to tolerate liquids or food, cough, altered mental status or any concerns. No signs of systemic illness or infection. The patient is nontoxic-appearing on exam and vital signs are within normal limits.   I have reviewed the triage vital signs and the nursing notes. Pertinent labs &imaging results that were available during my care of the patient were reviewed by me and considered in my medical decision making (see chart for details).  After history, exam, and medical workup I feel the patient has been appropriately medically screened and is safe for discharge home. Pertinent diagnoses were discussed with the patient. Patient was given return precautions ED Discharge Orders         Ordered    doxycycline (VIBRAMYCIN) 100 MG capsule  2 times daily     02/09/19 0223    naproxen (NAPROSYN) 375 MG tablet  2 times daily with meals     02/09/19 0223           Chaun Uemura, MD 02/09/19 0938

## 2019-02-10 ENCOUNTER — Telehealth: Payer: Self-pay | Admitting: *Deleted

## 2019-02-10 LAB — GC/CHLAMYDIA PROBE AMP (~~LOC~~) NOT AT ARMC
Chlamydia: NEGATIVE
Neisseria Gonorrhea: NEGATIVE

## 2019-02-10 NOTE — Telephone Encounter (Signed)
TOC CM received a call from pt stating her meds were sent to [facility] and that pharmacy is closed on weekend. Pt wanted Rx called to [facility] on [address]. Called Rx into [facility]. Jonnie Finner RN CCM Case Mgmt phone [phone]

## 2019-02-12 LAB — RPR, QUANT+TP ABS (REFLEX)
Rapid Plasma Reagin, Quant: 1:2 {titer} — ABNORMAL HIGH
T Pallidum Abs: REACTIVE — AB

## 2019-02-12 LAB — RPR: RPR Ser Ql: REACTIVE — AB

## 2019-02-19 ENCOUNTER — Emergency Department (HOSPITAL_COMMUNITY)
Admission: EM | Admit: 2019-02-19 | Discharge: 2019-02-19 | Disposition: A | Discharge status: Home / Self Care | Payer: Medicaid Other | Attending: Emergency Medicine | Admitting: Emergency Medicine

## 2019-02-19 ENCOUNTER — Emergency Department (HOSPITAL_COMMUNITY): Payer: Medicaid Other

## 2019-02-19 ENCOUNTER — Other Ambulatory Visit: Payer: Self-pay

## 2019-02-19 ENCOUNTER — Encounter (HOSPITAL_COMMUNITY): Payer: Self-pay | Admitting: Emergency Medicine

## 2019-02-19 DIAGNOSIS — R4182 Altered mental status, unspecified: Secondary | ICD-10-CM | POA: Diagnosis present

## 2019-02-19 DIAGNOSIS — Z20828 Contact with and (suspected) exposure to other viral communicable diseases: Secondary | ICD-10-CM | POA: Insufficient documentation

## 2019-02-19 DIAGNOSIS — Z79899 Other long term (current) drug therapy: Secondary | ICD-10-CM | POA: Insufficient documentation

## 2019-02-19 DIAGNOSIS — F10929 Alcohol use, unspecified with intoxication, unspecified: Secondary | ICD-10-CM | POA: Insufficient documentation

## 2019-02-19 DIAGNOSIS — F10920 Alcohol use, unspecified with intoxication, uncomplicated: Secondary | ICD-10-CM

## 2019-02-19 DIAGNOSIS — E119 Type 2 diabetes mellitus without complications: Secondary | ICD-10-CM | POA: Insufficient documentation

## 2019-02-19 DIAGNOSIS — F17210 Nicotine dependence, cigarettes, uncomplicated: Secondary | ICD-10-CM | POA: Insufficient documentation

## 2019-02-19 DIAGNOSIS — W19XXXA Unspecified fall, initial encounter: Secondary | ICD-10-CM

## 2019-02-19 DIAGNOSIS — E876 Hypokalemia: Secondary | ICD-10-CM | POA: Diagnosis not present

## 2019-02-19 DIAGNOSIS — Z794 Long term (current) use of insulin: Secondary | ICD-10-CM | POA: Insufficient documentation

## 2019-02-19 DIAGNOSIS — Y907 Blood alcohol level of 200-239 mg/100 ml: Secondary | ICD-10-CM | POA: Insufficient documentation

## 2019-02-19 LAB — COMPREHENSIVE METABOLIC PANEL
ALT: 14 U/L (ref 0–44)
AST: 14 U/L — ABNORMAL LOW (ref 15–41)
Albumin: 4.1 g/dL (ref 3.5–5.0)
Alkaline Phosphatase: 64 U/L (ref 38–126)
Anion gap: 18 — ABNORMAL HIGH (ref 5–15)
BUN: 9 mg/dL (ref 6–20)
CO2: 21 mmol/L — ABNORMAL LOW (ref 22–32)
Calcium: 9.1 mg/dL (ref 8.9–10.3)
Chloride: 100 mmol/L (ref 98–111)
Creatinine, Ser: 0.49 mg/dL (ref 0.44–1.00)
GFR calc Af Amer: >60 mL/min (ref >60)
GFR calc non Af Amer: >60 mL/min (ref >60)
Glucose, Bld: 381 mg/dL — ABNORMAL HIGH (ref 70–99)
Potassium: 2.9 mmol/L — ABNORMAL LOW (ref 3.5–5.1)
Sodium: 139 mmol/L (ref 135–145)
Total Bilirubin: 0.3 mg/dL (ref 0.3–1.2)
Total Protein: 7.8 g/dL (ref 6.5–8.1)

## 2019-02-19 LAB — CBC WITH DIFFERENTIAL/PLATELET
Abs Immature Granulocytes: 0.07 10*3/uL (ref 0.00–0.07)
Basophils Absolute: 0.1 10*3/uL (ref 0.0–0.1)
Basophils Relative: 1 %
Eosinophils Absolute: 0.2 10*3/uL (ref 0.0–0.5)
Eosinophils Relative: 2 %
HCT: 42.5 % (ref 36.0–46.0)
Hemoglobin: 14.2 g/dL (ref 12.0–15.0)
Immature Granulocytes: 1 %
Lymphocytes Relative: 30 %
Lymphs Abs: 3.7 10*3/uL (ref 0.7–4.0)
MCH: 30.1 pg (ref 26.0–34.0)
MCHC: 33.4 g/dL (ref 30.0–36.0)
MCV: 90 fL (ref 80.0–100.0)
Monocytes Absolute: 0.6 10*3/uL (ref 0.1–1.0)
Monocytes Relative: 5 %
Neutro Abs: 7.7 10*3/uL (ref 1.7–7.7)
Neutrophils Relative %: 61 %
Platelets: 259 10*3/uL (ref 150–400)
RBC: 4.72 MIL/uL (ref 3.87–5.11)
RDW: 11.8 % (ref 11.5–15.5)
WBC: 12.4 10*3/uL — ABNORMAL HIGH (ref 4.0–10.5)
nRBC: 0 % (ref 0.0–0.2)

## 2019-02-19 LAB — I-STAT BETA HCG BLOOD, ED (MC, WL, AP ONLY): I-stat hCG, quantitative: <5 m[IU]/mL (ref <5)

## 2019-02-19 LAB — SARS CORONAVIRUS 2 BY RT PCR (HOSPITAL ORDER, PERFORMED IN ~~LOC~~ HOSPITAL LAB): SARS Coronavirus 2: NEGATIVE

## 2019-02-19 LAB — SALICYLATE LEVEL: Salicylate Lvl: <7 mg/dL (ref 2.8–30.0)

## 2019-02-19 LAB — ACETAMINOPHEN LEVEL: Acetaminophen (Tylenol), Serum: <10 ug/mL — ABNORMAL LOW (ref 10–30)

## 2019-02-19 LAB — ETHANOL: Alcohol, Ethyl (B): 220 mg/dL — ABNORMAL HIGH (ref <10)

## 2019-02-19 LAB — PROTIME-INR
INR: 1 (ref 0.8–1.2)
Prothrombin Time: 12.7 seconds (ref 11.4–15.2)

## 2019-02-19 MED ORDER — POTASSIUM CHLORIDE CRYS ER 20 MEQ PO TBCR
40.0000 meq | EXTENDED_RELEASE_TABLET | Freq: Once | ORAL | Status: AC
  Administered 2019-02-19: 14:00:00 40 meq via ORAL
  Filled 2019-02-19: qty 2

## 2019-02-19 NOTE — ED Triage Notes (Addendum)
Per EMS called out for fall tripped over ottoman; pt responsive to pain and verbal but uncooperative and combative with EMS arrival. Pt moves all extremities.   Pt given 2.5 mg versed IM and 2.5 mg versed IV with EMS. Admits to cocaine use.

## 2019-02-19 NOTE — ED Notes (Signed)
Patient transported to CT 

## 2019-02-19 NOTE — ED Provider Notes (Signed)
[facility] DEPT Provider Note   CSN: [id number] Arrival date & time: 02/19/19  2505     History   Chief Complaint Chief Complaint  Patient presents with  . Fall  . Altered Mental Status    HPI Nancy [name] is a 53 y.o. female.     53 year old female presents by EMS following reported fall.  Patient arrives from home.  Patient reportedly tripped over an ottoman and fell.  She also reportedly used cocaine earlier today.  She was very agitated and mildly combative with EMS.  She was given Versed by EMS for control of her agitation.  Upon my initial evaluation the patient is somnolent but arousable.  She does not appear to be in distress.  She is without specific acute complaint.  Level 5 caveat secondary to patient's sedation.  The history is provided by the patient and medical records.  Fall This is a new problem. The current episode started 1 to 2 hours ago. The problem occurs rarely. The problem has not changed since onset.Pertinent negatives include no chest pain, no abdominal pain, no headaches and no shortness of breath. Nothing aggravates the symptoms. Nothing relieves the symptoms.  Altered Mental Status Associated symptoms: no abdominal pain and no headaches     Past Medical History:  Diagnosis Date  . Abnormal Pap smear   . Bronchitis   . Depression   . Diabetes ([place])   . GERD (gastroesophageal reflux disease)    tums prn  . Headache(784.0)   . Polysubstance abuse [facility])     Patient Active Problem List   Diagnosis Date Noted  . Unilateral primary osteoarthritis, left hip 08/03/2017  . Unilateral primary osteoarthritis, right hip 08/03/2017  . Type 2 diabetes mellitus ([place]) 07/29/2017  . Primary osteoarthritis of left hip 02/10/2016  . Asthma 12/31/2015  . Hyperlipidemia 10/30/2015  . Depression with anxiety 10/29/2015  . Polysubstance abuse ([place]) 09/04/2015  . Depression 09/04/2015  . Osteoarthritis of right hip  09/04/2015  . Alcohol abuse 12/05/2013  . S/P alcohol detoxification 12/05/2013  . 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 BV (bacterial vaginosis) 11/24/2011  . ASCUS with positive high risk HPV 11/08/2011    Past Surgical History:  Procedure Laterality Date  . CERVICAL CONIZATION W/BX  12/13/2011   Procedure: CONIZATION CERVIX WITH BIOPSY;  Surgeon: Emily Filbert, MD;  Location: [facility];  Service: Gynecology;  Laterality: N/A;  . DENTAL SURGERY       OB History    Gravida  1   Para  1   Term  1   Preterm      AB      Living  1     SAB      TAB      Ectopic      Multiple      Live Births               Home Medications    Prior to Admission medications   Medication Sig Start Date End Date Taking? Authorizing Provider  albuterol (VENTOLIN HFA) 108 (90 Base) MCG/ACT inhaler Inhale 1-2 puffs into the lungs every 6 (six) hours as needed for wheezing or shortness of breath. 02/09/19   Palumbo, April, MD  Blood Glucose Monitoring Suppl (ACCU-CHEK AVIVA PLUS) w/Device KIT USE AS INSTRUCTED 07/19/18   Charlott Rakes, MD  doxycycline (VIBRAMYCIN) 100 MG capsule Take 1 capsule (100 mg total) by mouth 2 (two) times daily. 02/09/19  Palumbo, April, MD  insulin glargine (LANTUS) 100 UNIT/ML injection Inject 0.25 mLs (25 Units total) into the skin 2 (two) times daily. 07/15/18   Molpus, John, MD  Insulin Syringe-Needle U-100 (INSULIN SYRINGE 1CC/30GX1/2") 30G X 1/2" 1 ML MISC 1 application by Does not apply route daily. 07/15/18   Molpus, Jenny Reichmann, MD  metFORMIN (GLUCOPHAGE) 1000 MG tablet Take 1 tablet (1,000 mg total) by mouth 2 (two) times daily with a meal. 07/15/18   Molpus, John, MD  naproxen (NAPROSYN) 375 MG tablet Take 1 tablet (375 mg total) by mouth 2 (two) times daily with a meal. 02/09/19   Palumbo, April, MD    Family History Family History  Problem Relation Age of Onset  . Cancer Mother        unsure    Social History Social History   Tobacco Use   . Smoking status: Current Every Day Smoker    Packs/day: 1.00    Years: 20.00    Pack years: 20.00    Types: Cigarettes  . Smokeless tobacco: Never Used  Substance Use Topics  . Alcohol use: Yes    Alcohol/week: 7.0 standard drinks    Types: 7 Cans of beer per week  . Drug use: Yes    Types: "Crack" cocaine, Marijuana    Comment: Pills(Vicodin) heroin (states no today 09/04/15)     Allergies   No known allergies   Review of Systems Review of Systems  Respiratory: Negative for shortness of breath.   Cardiovascular: Negative for chest pain.  Gastrointestinal: Negative for abdominal pain.  Neurological: Negative for headaches.  All other systems reviewed and are negative.    Physical Exam Updated Vital Signs BP 109/75   Pulse 81   Temp (!) 97.4 F (36.3 C) (Oral)   Resp 16   Ht 5' 5"  (1.651 m)   Wt 74.8 kg   SpO2 93%   BMI 27.46 kg/m   Physical Exam Vitals signs and nursing note reviewed.  Constitutional:      General: She is not in acute distress.    Appearance: Normal appearance. She is well-developed.     Comments: Sedated   Smells of alcohol    HENT:     Head: Normocephalic and atraumatic.  Eyes:     Conjunctiva/sclera: Conjunctivae normal.     Pupils: Pupils are equal, round, and reactive to light.  Neck:     Musculoskeletal: Normal range of motion and neck supple.  Cardiovascular:     Rate and Rhythm: Normal rate and regular rhythm.     Heart sounds: Normal heart sounds.  Pulmonary:     Effort: Pulmonary effort is normal. No respiratory distress.     Breath sounds: Normal breath sounds.  Abdominal:     General: There is no distension.     Palpations: Abdomen is soft.     Tenderness: There is no abdominal tenderness.  Musculoskeletal: Normal range of motion.        General: No deformity.  Skin:    General: Skin is warm and dry.  Neurological:     General: No focal deficit present.     Mental Status: She is alert and oriented to person,  place, and time.      ED Treatments / Results  Labs (all labs ordered are listed, but only abnormal results are displayed) Labs Reviewed  ACETAMINOPHEN LEVEL - Abnormal; Notable for the following components:      Result Value   Acetaminophen (Tylenol), Serum <10 (*)  All other components within normal limits  ETHANOL - Abnormal; Notable for the following components:   Alcohol, Ethyl (B) 220 (*)    All other components within normal limits  COMPREHENSIVE METABOLIC PANEL - Abnormal; Notable for the following components:   Potassium 2.9 (*)    CO2 21 (*)    Glucose, Bld 381 (*)    AST 14 (*)    Anion gap 18 (*)    All other components within normal limits  CBC WITH DIFFERENTIAL/PLATELET - Abnormal; Notable for the following components:   WBC 12.4 (*)    All other components within normal limits  SARS CORONAVIRUS 2 (HOSPITAL ORDER, [facility])  PROTIME-INR  SALICYLATE LEVEL  BLOOD GAS, VENOUS  RAPID URINE DRUG SCREEN, HOSP PERFORMED  URINALYSIS, ROUTINE W REFLEX MICROSCOPIC  I-STAT BETA HCG BLOOD, ED ([facility], [facility], [facility] ONLY)    EKG EKG Interpretation  Date/Time:  Monday February 19 2019 09:42:59 EDT Ventricular Rate:  89 PR Interval:    QRS Duration: 91 QT Interval:  406 QTC Calculation: 494 R Axis:   82 Text Interpretation:  Sinus rhythm Probable left atrial enlargement Anteroseptal infarct, age indeterminate Baseline wander in lead(s) V5 Confirmed by Dene Gentry [phone]) on 02/19/2019 10:03:18 AM   Radiology Dg Pelvis 1-2 Views  Result Date: 02/19/2019 CLINICAL DATA:  Hip pain EXAM: PELVIS - 1-2 VIEW COMPARISON:  07/15/2018 FINDINGS: No acute fracture or dislocation. SI joints and pubic symphysis intact without diastasis. Severe degenerative changes of the bilateral hips, similar to prior. No discernible bone lesion. IMPRESSION: 1. No acute osseous abnormality. 2. Severe degenerative changes of the bilateral hips, right worse than left.  Electronically Signed   By: Davina Poke M.D.   On: 02/19/2019 12:58   Ct Head Wo Contrast  Result Date: 02/19/2019 CLINICAL DATA:  Minor head trauma, fell, tripped over an ottoman, responsive to pain and verbal but uncooperative and combative, history diabetes mellitus, polysubstance abuse, GERD, smoker EXAM: CT HEAD WITHOUT CONTRAST TECHNIQUE: Contiguous axial images were obtained from the base of the skull through the vertex without intravenous contrast. Sagittal and coronal MPR images reconstructed from axial data set. COMPARISON:  None FINDINGS: Brain: Significant motion artifacts despite repeating exam. Normal ventricular morphology. No gross midline shift. No gross evidence of intracranial hemorrhage, mass or infarct though exam is severely limited. Intracranial pathology not excluded by this study. Vascular: Unable to assess Skull: Unable to adequately assess Sinuses/Orbits: Grossly clear Other: N/A IMPRESSION: No gross acute intracranial abnormalities identified on exam severely limited by patient motion artifacts despite repeating imaging. Intracranial pathology cannot be excluded by this study. Electronically Signed   By: Lavonia Dana M.D.   On: 02/19/2019 11:04    Procedures Procedures (including critical care time)  Medications Ordered in ED Medications - No data to display   Initial Impression / Assessment and Plan / ED Course  I have reviewed the triage vital signs and the nursing notes.  Pertinent labs & imaging results that were available during my care of the patient were reviewed by me and considered in my medical decision making (see chart for details).        MDM  Screen complete  Nancy [name] was evaluated in Emergency Department on 02/19/2019 for the symptoms described in the history of present illness. She was evaluated in the context of the global COVID-19 pandemic, which necessitated consideration that the patient might be at risk for infection with the  SARS-CoV-2 virus that causes  COVID-19. Institutional protocols and algorithms that pertain to the evaluation of patients at risk for COVID-19 are in a state of rapid change based on information released by regulatory bodies including the CDC and federal and state organizations. These policies and algorithms were followed during the patient's care in the ED.  Patient presented for evaluation following fall.  She appears to be clinically intoxicated.  Her alcohol level was noted to be elevated.  She is without evidence of significant traumatic injury during the work-up.  Following her ED evaluation she feels improved.  She desires discharge home.  She is advised to closely follow-up with her regular care providers.  Strict return precautions given and understood.   Final Clinical Impressions(s) / ED Diagnoses   Final diagnoses:  Fall, initial encounter  Alcoholic intoxication without complication [facility])  Hypokalemia    ED Discharge Orders    None       Valarie Merino, MD 02/19/19 1358

## 2019-02-19 NOTE — ED Notes (Signed)
Pt complaining her calf is hurting. Pt slapping calf.

## 2019-02-19 NOTE — Discharge Instructions (Signed)
Please return for any problem.  Follow-up with your regular care provider as instructed.  Drink alcohol in moderation.  Your potassium levels today were mildly decreased today.  You should have a repeat potassium performed within the next 2 weeks.

## 2019-04-12 ENCOUNTER — Encounter: Payer: Self-pay | Admitting: Obstetrics & Gynecology

## 2019-04-12 ENCOUNTER — Ambulatory Visit: Payer: Medicaid Other | Admitting: Obstetrics & Gynecology

## 2019-05-02 ENCOUNTER — Ambulatory Visit: Payer: Medicaid Other | Admitting: Orthopaedic Surgery

## 2019-07-12 ENCOUNTER — Ambulatory Visit: Payer: Medicaid Other | Admitting: Internal Medicine

## 2019-08-08 ENCOUNTER — Ambulatory Visit: Payer: Medicaid Other | Admitting: Internal Medicine

## 2019-08-08 ENCOUNTER — Other Ambulatory Visit: Payer: Self-pay

## 2019-08-08 ENCOUNTER — Encounter: Payer: Self-pay | Admitting: Podiatry

## 2019-08-08 ENCOUNTER — Ambulatory Visit (INDEPENDENT_AMBULATORY_CARE_PROVIDER_SITE_OTHER): Payer: Medicaid Other | Admitting: Podiatry

## 2019-08-08 DIAGNOSIS — E119 Type 2 diabetes mellitus without complications: Secondary | ICD-10-CM | POA: Insufficient documentation

## 2019-08-08 DIAGNOSIS — B351 Tinea unguium: Secondary | ICD-10-CM | POA: Diagnosis not present

## 2019-08-08 DIAGNOSIS — M79674 Pain in right toe(s): Secondary | ICD-10-CM | POA: Diagnosis not present

## 2019-08-08 DIAGNOSIS — M79675 Pain in left toe(s): Secondary | ICD-10-CM | POA: Diagnosis not present

## 2019-08-08 NOTE — Progress Notes (Signed)
This patient presents to the office with chief complaint of long thick nails and diabetic feet.  This patient  says there  is  no pain and discomfort in her  feet.  This patient says there are long thick painful nails.  These nails are painful walking and wearing shoes.  Patient has no history of infection or drainage from both feet.  Patient is unable to  self treat his own nails . This patient presents  to the office today for treatment of the  long nails and a foot evaluation due to history of  diabetes. ? ?General Appearance  Alert, conversant and in no acute stress. ? ?Vascular  Dorsalis pedis and posterior tibial  pulses are palpable  bilaterally.  Capillary return is within normal limits  bilaterally. Temperature is within normal limits  bilaterally. ? ?Neurologic  Senn-Weinstein monofilament wire test within normal limits  bilaterally. Muscle power within normal limits bilaterally. ? ?Nails Thick disfigured discolored nails with subungual debris  from hallux to fifth toes bilaterally. No evidence of bacterial infection or drainage bilaterally. ? ?Orthopedic  No limitations of motion of motion feet .  No crepitus or effusions noted.  No bony pathology or digital deformities noted. ? ?Skin  normotropic skin with no porokeratosis noted bilaterally.  No signs of infections or ulcers noted.    ? ?Onychomycosis  Diabetes with no foot complications ? ?IE  Debride nails x 10.  A diabetic foot exam was performed and there is no evidence of any vascular or neurologic pathology.   RTC 3 months. ? ? ?Suhailah Kwan DPM   ?

## 2019-11-07 ENCOUNTER — Ambulatory Visit: Payer: Medicaid Other | Admitting: Podiatry

## 2019-12-12 IMAGING — CT CT HEAD W/O CM
3 of 6 series · 16 of 47 positions shown, 19 images · non-contrast
Comparison: None

CLINICAL DATA: Minor head trauma, fell, tripped over an ottoman,
responsive to pain and verbal but uncooperative and combative,
history diabetes mellitus, polysubstance abuse, GERD, smoker

EXAM:
CT HEAD WITHOUT CONTRAST
TECHNIQUE: Contiguous axial images were obtained from the base of the skull
through the vertex without intravenous contrast. Sagittal and
coronal MPR images reconstructed from axial data set.

[Series 5: sagittal soft tissue · sagittal · 0.37mm/px · 2 of 56 slices shown]
[im 19/56  brain]
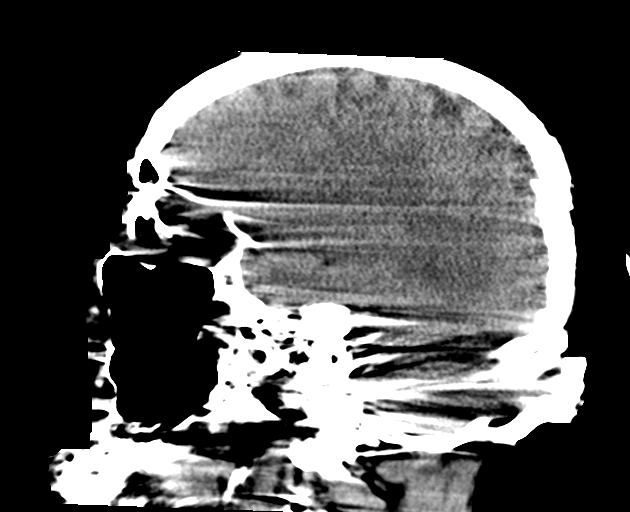
[im 37/56  brain]
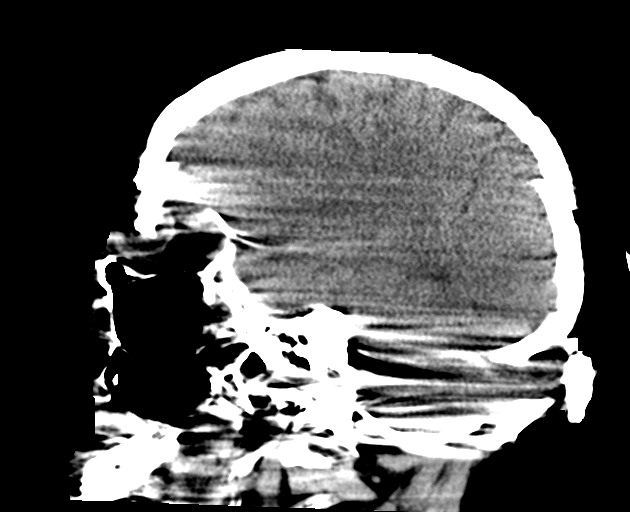

[Series 7: head wo · axial · 0.53mm/px · z∈[-107,+58]mm · 11 of 39 slices shown, 14 images]
[im 3/39  brain]
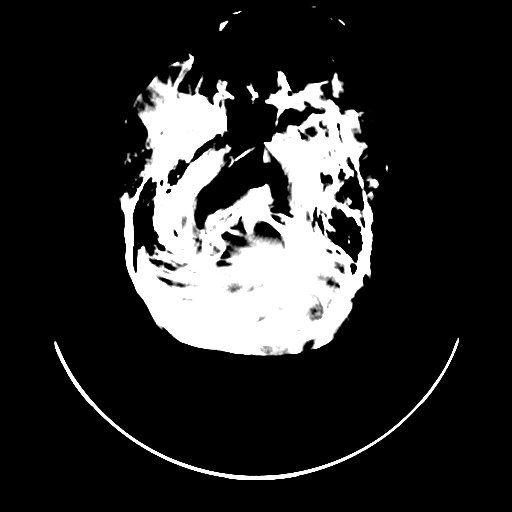
[im 3/39  bone]
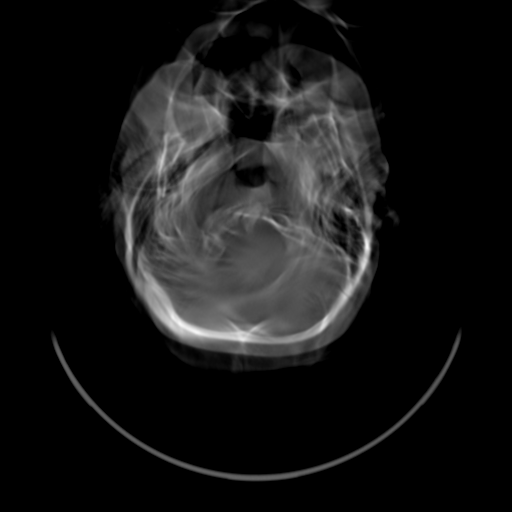
[im 6/39  brain]
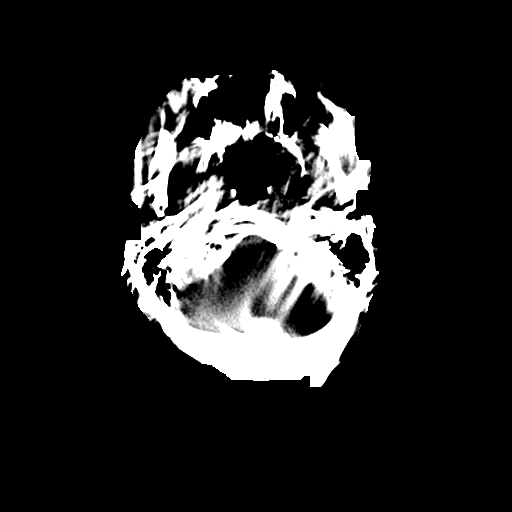
[im 11/39  brain]
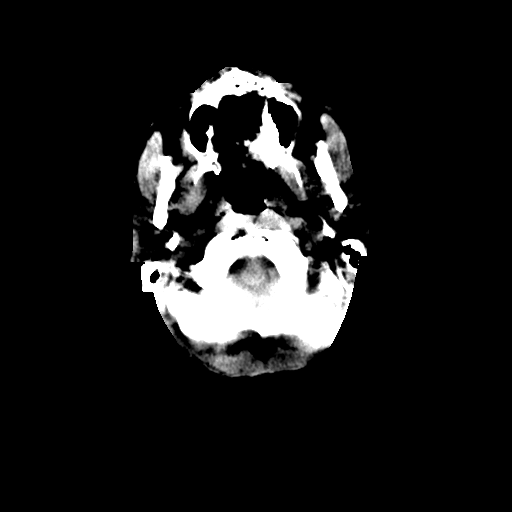
[im 13/39  brain]
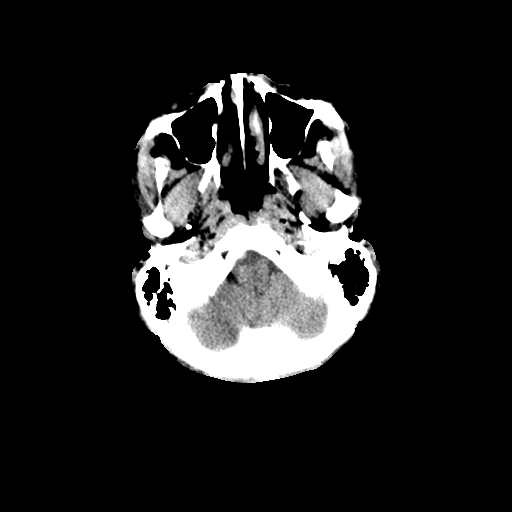
[im 16/39  brain]
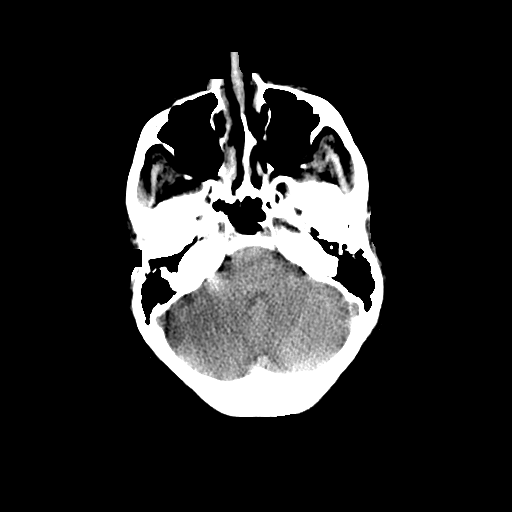
[im 16/39  bone]
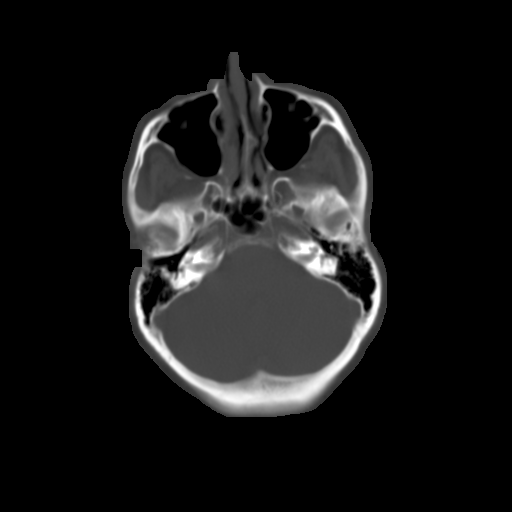
[im 21/39  brain]
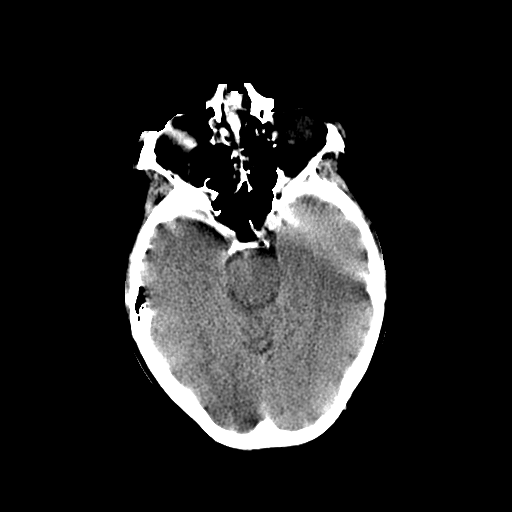
[im 23/39  brain]
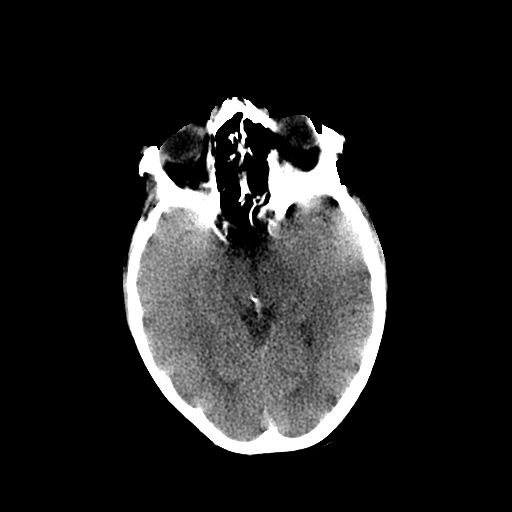
[im 26/39  brain]
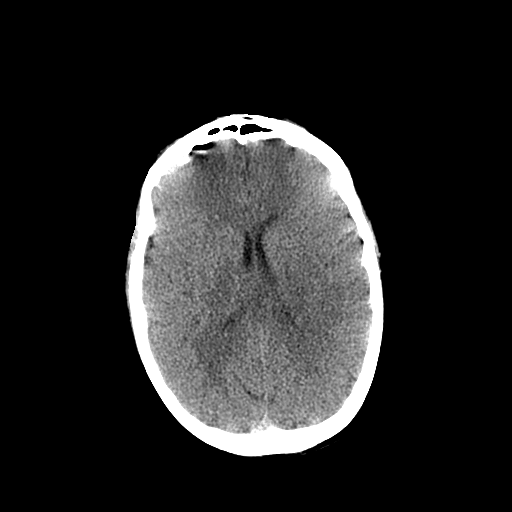
[im 28/39  brain]
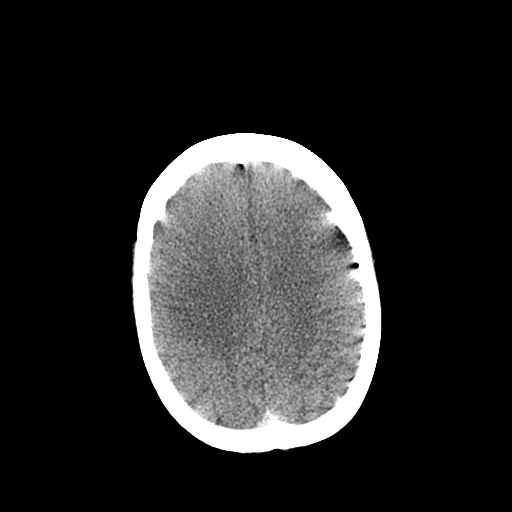
[im 28/39  bone]
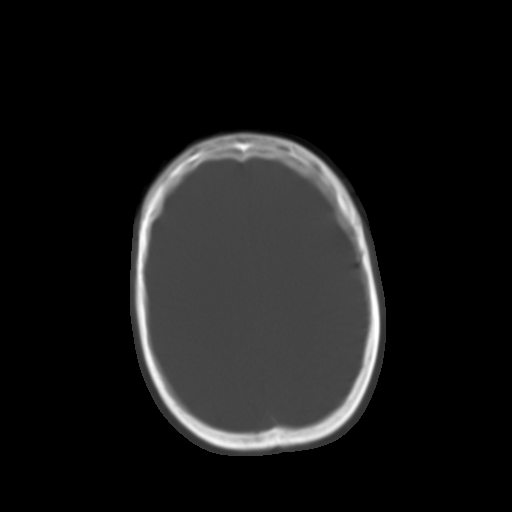
[im 33/39  brain]
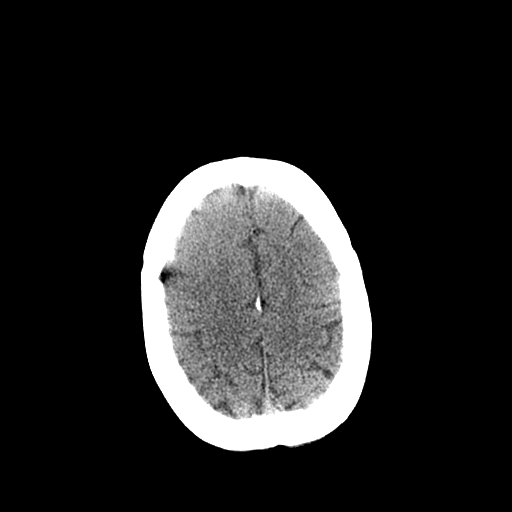
[im 36/39  brain]
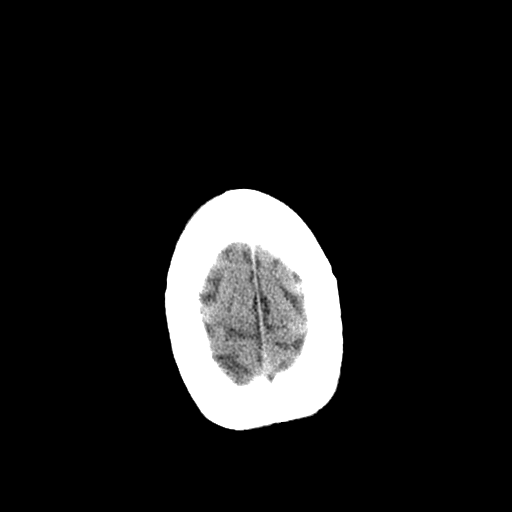

[Series 9: coronal soft tissue · coronal · 0.33mm/px · 3 of 84 slices shown]
[im 21/84  brain]
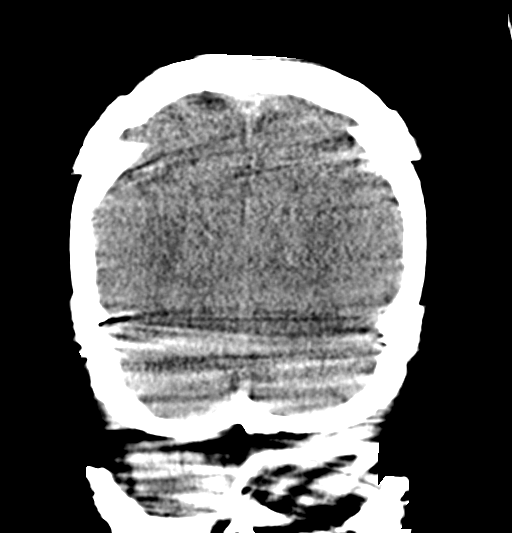
[im 42/84  brain]
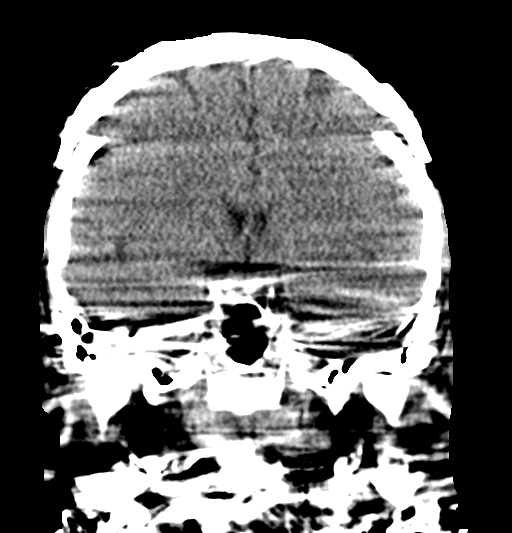
[im 63/84  brain]
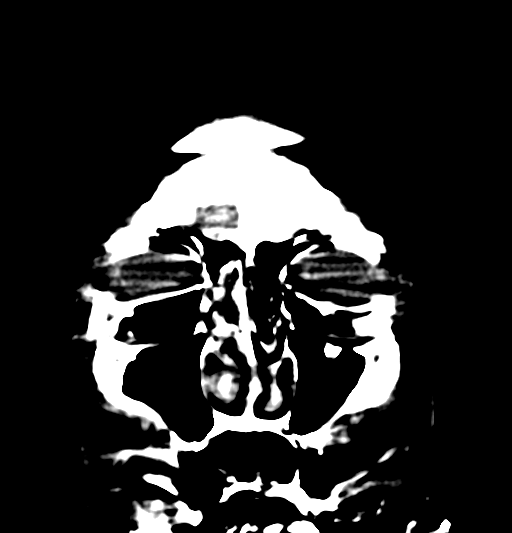

[16 of 47 positions shown; findings below may reference images not displayed]

FINDINGS: Brain: Significant motion artifacts despite repeating exam. Normal
ventricular morphology. No gross midline shift. No gross evidence of
intracranial hemorrhage, mass or infarct though exam is severely
limited. Intracranial pathology not excluded by this study.

Vascular: Unable to assess

Skull: Unable to adequately assess

Sinuses/Orbits: Grossly clear

Other: N/A
IMPRESSION: No gross acute intracranial abnormalities identified on exam
severely limited by patient motion artifacts despite repeating
imaging.

Intracranial pathology cannot be excluded by this study.

## 2020-02-08 ENCOUNTER — Telehealth: Payer: Self-pay | Admitting: Podiatry

## 2020-02-08 NOTE — Telephone Encounter (Signed)
Pt left message @ 325pm and had some questions about diabetic shoes.  I returned call and pts insurance will not cover them in our office but pt needs to see the provider here and have a diabetic foot exam to make sure she qualifies for diabetic shoes and then the doctor can give pt a rx to go to [facility] or [facility] in [place] as those are the only 2 that I am aware of that can bill for shoes with pts insurance. I put the note in the office visit that pt is interested in diabetic shoes.

## 2020-05-02 ENCOUNTER — Ambulatory Visit: Payer: Medicaid Other | Admitting: Podiatry

## 2020-05-14 ENCOUNTER — Ambulatory Visit: Payer: Medicaid Other | Admitting: Podiatry

## 2020-05-21 ENCOUNTER — Other Ambulatory Visit: Payer: Self-pay

## 2020-05-21 ENCOUNTER — Encounter: Payer: Self-pay | Admitting: Podiatry

## 2020-05-21 ENCOUNTER — Ambulatory Visit (INDEPENDENT_AMBULATORY_CARE_PROVIDER_SITE_OTHER): Payer: Medicaid Other | Admitting: Podiatry

## 2020-05-21 DIAGNOSIS — M79674 Pain in right toe(s): Secondary | ICD-10-CM | POA: Diagnosis not present

## 2020-05-21 DIAGNOSIS — B351 Tinea unguium: Secondary | ICD-10-CM | POA: Diagnosis not present

## 2020-05-21 DIAGNOSIS — M79675 Pain in left toe(s): Secondary | ICD-10-CM | POA: Diagnosis not present

## 2020-05-21 DIAGNOSIS — E119 Type 2 diabetes mellitus without complications: Secondary | ICD-10-CM

## 2020-05-21 NOTE — Progress Notes (Signed)
This patient presents to the office with chief complaint of long thick nails and diabetic feet.  This patient  says there  is  no pain and discomfort in her  feet.  This patient says there are long thick painful nails.  These nails are painful walking and wearing shoes.   Patient is unable to  self treat his own nails . This patient presents  to the office today for treatment of the  long nails   General Appearance  Alert, conversant and in no acute stress.  Vascular  Dorsalis pedis and posterior tibial  pulses are palpable  bilaterally.  Capillary return is within normal limits  bilaterally. Temperature is within normal limits  bilaterally.  Neurologic  Senn-Weinstein monofilament wire test within normal limits  bilaterally. Muscle power within normal limits bilaterally.  Nails Thick disfigured discolored nails with subungual debris  from hallux to fifth toes bilaterally. No evidence of bacterial infection or drainage bilaterally.  Orthopedic  No limitations of motion of motion feet .  No crepitus or effusions noted.  No bony pathology or digital deformities noted.  HAV  B/L.  Skin  normotropic skin with no porokeratosis noted bilaterally.  No signs of infections or ulcers noted.     Onychomycosis  Diabetes with no foot complications  Debride nails x 10.    Discussed surgery for her bunions.  To check with her doctor performing hip surgery.  RTC 4 months.   Helane Gunther DPM

## 2020-08-26 ENCOUNTER — Encounter: Payer: Self-pay | Admitting: Podiatry

## 2020-08-26 ENCOUNTER — Other Ambulatory Visit: Payer: Self-pay

## 2020-08-26 ENCOUNTER — Ambulatory Visit (INDEPENDENT_AMBULATORY_CARE_PROVIDER_SITE_OTHER): Payer: Medicaid Other | Admitting: Podiatry

## 2020-08-26 DIAGNOSIS — M79675 Pain in left toe(s): Secondary | ICD-10-CM | POA: Diagnosis not present

## 2020-08-26 DIAGNOSIS — E119 Type 2 diabetes mellitus without complications: Secondary | ICD-10-CM

## 2020-08-26 DIAGNOSIS — B351 Tinea unguium: Secondary | ICD-10-CM | POA: Diagnosis not present

## 2020-08-26 DIAGNOSIS — M79674 Pain in right toe(s): Secondary | ICD-10-CM

## 2020-08-26 NOTE — Progress Notes (Signed)
This patient presents to the office with chief complaint of long thick nails and diabetic feet.  This patient  says there  is  no pain and discomfort in her  feet.  This patient says there are long thick painful nails.  These nails are painful walking and wearing shoes.   Patient is unable to  self treat his own nails . This patient presents  to the office today for treatment of the  long nails   General Appearance  Alert, conversant and in no acute stress.  Vascular  Dorsalis pedis and posterior tibial  pulses are palpable  bilaterally.  Capillary return is within normal limits  bilaterally. Temperature is within normal limits  bilaterally.  Neurologic  Senn-Weinstein monofilament wire test within normal limits  bilaterally. Muscle power within normal limits bilaterally.  Nails Thick disfigured discolored nails with subungual debris  from hallux to fifth toes bilaterally. No evidence of bacterial infection or drainage bilaterally.  Orthopedic  No limitations of motion of motion feet .  No crepitus or effusions noted.  No bony pathology or digital deformities noted.  HAV  B/L.  Skin  normotropic skin with no porokeratosis noted bilaterally.  No signs of infections or ulcers noted.     Onychomycosis  Diabetes with no foot complications  Debride nails x 10.    Discussed surgery for her bunions.    RTC 3  months.   Helane Gunther DPM

## 2020-09-24 ENCOUNTER — Ambulatory Visit: Payer: Medicaid Other | Admitting: Podiatry

## 2020-12-02 ENCOUNTER — Ambulatory Visit: Payer: Medicaid Other | Admitting: Podiatry

## 2020-12-12 ENCOUNTER — Ambulatory Visit: Payer: Medicaid Other | Admitting: Podiatry

## 2020-12-30 ENCOUNTER — Encounter: Payer: Self-pay | Admitting: Podiatry

## 2020-12-30 ENCOUNTER — Ambulatory Visit (INDEPENDENT_AMBULATORY_CARE_PROVIDER_SITE_OTHER): Payer: Medicaid Other | Admitting: Podiatry

## 2020-12-30 ENCOUNTER — Other Ambulatory Visit: Payer: Self-pay

## 2020-12-30 DIAGNOSIS — M79674 Pain in right toe(s): Secondary | ICD-10-CM | POA: Diagnosis not present

## 2020-12-30 DIAGNOSIS — M79675 Pain in left toe(s): Secondary | ICD-10-CM | POA: Diagnosis not present

## 2020-12-30 DIAGNOSIS — B351 Tinea unguium: Secondary | ICD-10-CM

## 2020-12-30 DIAGNOSIS — E119 Type 2 diabetes mellitus without complications: Secondary | ICD-10-CM | POA: Diagnosis not present

## 2020-12-30 NOTE — Progress Notes (Signed)
This patient presents to the office with chief complaint of long thick nails and diabetic feet.  This patient  says there  is  no pain and discomfort in her  feet.  This patient says there are long thick painful nails.  These nails are painful walking and wearing shoes.   Patient is unable to  self treat his own nails . This patient presents  to the office today for treatment of the  long nails   General Appearance  Alert, conversant and in no acute stress.  Vascular  Dorsalis pedis and posterior tibial  pulses are palpable  bilaterally.  Capillary return is within normal limits  bilaterally. Temperature is within normal limits  bilaterally.  Neurologic  Senn-Weinstein monofilament wire test within normal limits  bilaterally. Muscle power within normal limits bilaterally.  Nails Thick disfigured discolored nails with subungual debris  from hallux to fifth toes bilaterally. No evidence of bacterial infection or drainage bilaterally.  Orthopedic  No limitations of motion of motion feet .  No crepitus or effusions noted.  No bony pathology or digital deformities noted.  HAV  B/L.  Skin  normotropic skin with no porokeratosis noted bilaterally.  No signs of infections or ulcers noted.     Onychomycosis  Diabetes with no foot complications  Debride nails x 10.    Discussed surgery for her bunions.    RTC 3  months.   Helane Gunther DPM

## 2021-04-07 ENCOUNTER — Telehealth: Payer: Self-pay | Admitting: *Deleted

## 2021-04-07 ENCOUNTER — Ambulatory Visit (INDEPENDENT_AMBULATORY_CARE_PROVIDER_SITE_OTHER): Payer: Medicaid Other

## 2021-04-07 ENCOUNTER — Other Ambulatory Visit: Payer: Self-pay

## 2021-04-07 ENCOUNTER — Encounter: Payer: Self-pay | Admitting: Podiatry

## 2021-04-07 ENCOUNTER — Ambulatory Visit (INDEPENDENT_AMBULATORY_CARE_PROVIDER_SITE_OTHER): Payer: Medicaid Other | Admitting: Podiatry

## 2021-04-07 DIAGNOSIS — L02619 Cutaneous abscess of unspecified foot: Secondary | ICD-10-CM

## 2021-04-07 DIAGNOSIS — B351 Tinea unguium: Secondary | ICD-10-CM

## 2021-04-07 DIAGNOSIS — E119 Type 2 diabetes mellitus without complications: Secondary | ICD-10-CM | POA: Diagnosis not present

## 2021-04-07 DIAGNOSIS — L03119 Cellulitis of unspecified part of limb: Secondary | ICD-10-CM | POA: Diagnosis not present

## 2021-04-07 DIAGNOSIS — M79675 Pain in left toe(s): Secondary | ICD-10-CM

## 2021-04-07 DIAGNOSIS — M79674 Pain in right toe(s): Secondary | ICD-10-CM

## 2021-04-07 MED ORDER — CEPHALEXIN 500 MG PO CAPS
500.0000 mg | ORAL_CAPSULE | Freq: Two times a day (BID) | ORAL | 0 refills | Status: AC
  Filled 2021-04-07: qty 20, 10d supply, fill #0

## 2021-04-07 MED ORDER — CEPHALEXIN 500 MG PO CAPS
500.0000 mg | ORAL_CAPSULE | Freq: Two times a day (BID) | ORAL | 0 refills | Status: DC
  Filled 2021-04-07: qty 20, 10d supply, fill #0

## 2021-04-07 NOTE — Progress Notes (Signed)
This patient presents to the office with chief complaint of long thick nails and diabetic feet.    This patient says there are long thick painful nails.  These nails are painful walking and wearing shoes.   Patient is unable to  self treat his own nails . She also has painful skin lesion of 2 month duration.  She says this area is painful but does not remember any injury to her foot.  This patient presents  to the office today for treatment of the  long nails   General Appearance  Alert, conversant and in no acute stress.  Vascular  Dorsalis pedis and posterior tibial  pulses are palpable  bilaterally.  Capillary return is within normal limits  bilaterally. Temperature is within normal limits  bilaterally.  Neurologic  Senn-Weinstein monofilament wire test within normal limits  bilaterally. Muscle power within normal limits bilaterally.  Nails Thick disfigured discolored nails with subungual debris  from hallux to fifth toes bilaterally. No evidence of bacterial infection or drainage bilaterally.  Orthopedic  No limitations of motion of motion feet .  No crepitus or effusions noted.  No bony pathology or digital deformities noted.  HAV  B/L.  Skin  normotropic skin with no porokeratosis noted bilaterally.  No signs of infections or ulcers noted.   Red inflamed skin lesion plantar arch  right foot.  Onychomycosis  Diabetes with no foot complications  Abscess right foot.  Debride nails x 10.    Incision and drainage abscess right foot.  Neosporin/DSD  Prescribe cephalexin  BID.     RTC 3  months.   Helane Gunther DPM

## 2021-04-15 ENCOUNTER — Other Ambulatory Visit: Payer: Self-pay

## 2021-05-12 NOTE — Telephone Encounter (Signed)
Error message

## 2021-07-08 ENCOUNTER — Ambulatory Visit: Payer: Medicaid Other | Admitting: Podiatry

## 2021-09-08 ENCOUNTER — Encounter: Payer: Self-pay | Admitting: Podiatry

## 2021-09-08 ENCOUNTER — Ambulatory Visit (INDEPENDENT_AMBULATORY_CARE_PROVIDER_SITE_OTHER): Payer: Medicaid Other | Admitting: Podiatry

## 2021-09-08 ENCOUNTER — Other Ambulatory Visit: Payer: Self-pay

## 2021-09-08 DIAGNOSIS — B351 Tinea unguium: Secondary | ICD-10-CM

## 2021-09-08 DIAGNOSIS — M79675 Pain in left toe(s): Secondary | ICD-10-CM

## 2021-09-08 DIAGNOSIS — E119 Type 2 diabetes mellitus without complications: Secondary | ICD-10-CM | POA: Diagnosis not present

## 2021-09-08 DIAGNOSIS — M79674 Pain in right toe(s): Secondary | ICD-10-CM | POA: Diagnosis not present

## 2021-09-08 NOTE — Progress Notes (Signed)
This patient presents to the office with chief complaint of long thick nails and diabetic feet.  This patient  says there  is  no pain and discomfort in her  feet.  This patient says there are long thick painful nails.  These nails are painful walking and wearing shoes.   Patient is unable to  self treat his own nails . This patient presents  to the office today for treatment of the  long nails   General Appearance  Alert, conversant and in no acute stress.  Vascular  Dorsalis pedis and posterior tibial  pulses are palpable  bilaterally.  Capillary return is within normal limits  bilaterally. Temperature is within normal limits  bilaterally.  Neurologic  Senn-Weinstein monofilament wire test within normal limits  bilaterally. Muscle power within normal limits bilaterally.  Nails Thick disfigured discolored nails with subungual debris  from hallux to fifth toes bilaterally. No evidence of bacterial infection or drainage bilaterally.  Orthopedic  No limitations of motion of motion feet .  No crepitus or effusions noted.  No bony pathology or digital deformities noted.  HAV  B/L.  Skin  normotropic skin with no porokeratosis noted bilaterally.  No signs of infections or ulcers noted.     Onychomycosis  Diabetes with no foot complications  Debride nails x 10.    Discussed surgery for her bunions.    RTC 3  months.   Ulus Hazen DPM  

## 2021-12-09 ENCOUNTER — Ambulatory Visit (INDEPENDENT_AMBULATORY_CARE_PROVIDER_SITE_OTHER): Payer: Medicaid Other | Admitting: Podiatry

## 2021-12-09 ENCOUNTER — Encounter: Payer: Self-pay | Admitting: Podiatry

## 2021-12-09 DIAGNOSIS — B351 Tinea unguium: Secondary | ICD-10-CM

## 2021-12-09 DIAGNOSIS — M79675 Pain in left toe(s): Secondary | ICD-10-CM | POA: Diagnosis not present

## 2021-12-09 DIAGNOSIS — M79674 Pain in right toe(s): Secondary | ICD-10-CM

## 2021-12-09 DIAGNOSIS — E119 Type 2 diabetes mellitus without complications: Secondary | ICD-10-CM

## 2021-12-09 NOTE — Progress Notes (Signed)
This patient presents to the office with chief complaint of long thick nails and diabetic feet.  This patient  says there  is  no pain and discomfort in her  feet.  This patient says there are long thick painful nails.  These nails are painful walking and wearing shoes.   Patient is unable to  self treat his own nails . This patient presents  to the office today for treatment of the  long nails   General Appearance  Alert, conversant and in no acute stress.  Vascular  Dorsalis pedis and posterior tibial  pulses are palpable  bilaterally.  Capillary return is within normal limits  bilaterally. Temperature is within normal limits  bilaterally.  Neurologic  Senn-Weinstein monofilament wire test within normal limits  bilaterally. Muscle power within normal limits bilaterally.  Nails Thick disfigured discolored nails with subungual debris  from hallux to fifth toes bilaterally. No evidence of bacterial infection or drainage bilaterally.  Orthopedic  No limitations of motion of motion feet .  No crepitus or effusions noted.  No bony pathology or digital deformities noted.  HAV  B/L.  Skin  normotropic skin with no porokeratosis noted bilaterally.  No signs of infections or ulcers noted.     Onychomycosis  Diabetes with no foot complications  Debride nails x 10.       RTC 3  months.   Helane Gunther DPM

## 2022-03-15 ENCOUNTER — Encounter: Payer: Self-pay | Admitting: Podiatry

## 2022-03-15 ENCOUNTER — Ambulatory Visit (INDEPENDENT_AMBULATORY_CARE_PROVIDER_SITE_OTHER): Payer: Medicaid Other | Admitting: Podiatry

## 2022-03-15 DIAGNOSIS — M79674 Pain in right toe(s): Secondary | ICD-10-CM | POA: Diagnosis not present

## 2022-03-15 DIAGNOSIS — M79675 Pain in left toe(s): Secondary | ICD-10-CM

## 2022-03-15 DIAGNOSIS — E119 Type 2 diabetes mellitus without complications: Secondary | ICD-10-CM

## 2022-03-15 DIAGNOSIS — B351 Tinea unguium: Secondary | ICD-10-CM | POA: Diagnosis not present

## 2022-03-15 NOTE — Progress Notes (Signed)
This patient presents to the office with chief complaint of long thick nails and diabetic feet.  This patient  says there  is  no pain and discomfort in her  feet.  This patient says there are long thick painful nails.  These nails are painful walking and wearing shoes.   Patient is unable to  self treat his own nails . This patient presents  to the office today for treatment of the  long nails   General Appearance  Alert, conversant and in no acute stress.  Vascular  Dorsalis pedis and posterior tibial  pulses are palpable  bilaterally.  Capillary return is within normal limits  bilaterally. Temperature is within normal limits  bilaterally.  Neurologic  Senn-Weinstein monofilament wire test within normal limits  bilaterally. Muscle power within normal limits bilaterally.  Nails Thick disfigured discolored nails with subungual debris  from hallux to fifth toes bilaterally. No evidence of bacterial infection or drainage bilaterally.  Orthopedic  No limitations of motion of motion feet .  No crepitus or effusions noted.  No bony pathology or digital deformities noted.  HAV  B/L.  Skin  normotropic skin with no porokeratosis noted bilaterally.  No signs of infections or ulcers noted.     Onychomycosis  Diabetes with no foot complications  Debride nails with nail nipper.     RTC 3  months.   Gardiner Barefoot DPM

## 2022-10-07 ENCOUNTER — Ambulatory Visit: Payer: Medicaid Other | Admitting: Podiatry

## 2022-10-21 ENCOUNTER — Ambulatory Visit (INDEPENDENT_AMBULATORY_CARE_PROVIDER_SITE_OTHER): Payer: Medicaid Other | Admitting: Podiatry

## 2022-10-21 ENCOUNTER — Encounter: Payer: Self-pay | Admitting: Podiatry

## 2022-10-21 DIAGNOSIS — E1149 Type 2 diabetes mellitus with other diabetic neurological complication: Secondary | ICD-10-CM

## 2022-10-21 DIAGNOSIS — B351 Tinea unguium: Secondary | ICD-10-CM

## 2022-10-21 DIAGNOSIS — M79675 Pain in left toe(s): Secondary | ICD-10-CM

## 2022-10-21 DIAGNOSIS — M79674 Pain in right toe(s): Secondary | ICD-10-CM | POA: Diagnosis not present

## 2022-10-21 DIAGNOSIS — E1140 Type 2 diabetes mellitus with diabetic neuropathy, unspecified: Secondary | ICD-10-CM

## 2022-10-21 NOTE — Progress Notes (Signed)
Subjective:   Patient ID: Nancy [name], female   DOB: 58 y.o.   MRN: [id number]   HPI Patient presents long-term diabetes with severe nail disease 1-5 both feet thick yellow brittle that are painful for her and moderate diminishment of nerve with history of diabetes    ROS      Objective:  Physical Exam  Vascular status intact neurologically diminishment of sharp dull vibratory with diabetes with severely thickened yellow brittle nailbeds 1-5 both feet that become painful in shoe gear     Assessment:  Chronic mycotic nail infection with pain 1-5 both feet along with diabetes     Plan:  H&P reviewed and went ahead today discussed diabetes and smoking reduction.  Debrided nailbeds 1-5 both feet no angiogenic bleeding reappoint routine care

## 2022-12-27 ENCOUNTER — Ambulatory Visit: Payer: Medicaid Other | Admitting: Physician Assistant

## 2023-01-17 ENCOUNTER — Ambulatory Visit: Payer: Medicaid Other | Admitting: Physician Assistant

## 2023-01-25 ENCOUNTER — Ambulatory Visit: Payer: Medicaid Other | Admitting: Physician Assistant

## 2023-02-24 ENCOUNTER — Ambulatory Visit: Payer: Medicaid Other | Admitting: Podiatry

## 2023-07-20 ENCOUNTER — Ambulatory Visit: Payer: Medicaid Other | Admitting: Podiatry

## 2023-08-01 ENCOUNTER — Ambulatory Visit: Payer: Medicaid Other | Admitting: Podiatry

## 2023-09-28 ENCOUNTER — Ambulatory Visit: Admitting: Podiatry

## 2023-10-25 ENCOUNTER — Observation Stay (HOSPITAL_COMMUNITY)
Admission: EM | Admit: 2023-10-25 | Discharge: 2023-10-28 | Disposition: A | Discharge status: Home / Self Care | Attending: Internal Medicine | Admitting: Internal Medicine

## 2023-10-25 ENCOUNTER — Encounter (HOSPITAL_COMMUNITY): Payer: Self-pay

## 2023-10-25 ENCOUNTER — Other Ambulatory Visit: Payer: Self-pay

## 2023-10-25 ENCOUNTER — Emergency Department (HOSPITAL_COMMUNITY)

## 2023-10-25 DIAGNOSIS — F4323 Adjustment disorder with mixed anxiety and depressed mood: Secondary | ICD-10-CM | POA: Diagnosis not present

## 2023-10-25 DIAGNOSIS — E876 Hypokalemia: Secondary | ICD-10-CM | POA: Diagnosis not present

## 2023-10-25 DIAGNOSIS — F418 Other specified anxiety disorders: Secondary | ICD-10-CM | POA: Diagnosis present

## 2023-10-25 DIAGNOSIS — N179 Acute kidney failure, unspecified: Secondary | ICD-10-CM | POA: Diagnosis not present

## 2023-10-25 DIAGNOSIS — E1110 Type 2 diabetes mellitus with ketoacidosis without coma: Secondary | ICD-10-CM | POA: Diagnosis not present

## 2023-10-25 DIAGNOSIS — F1010 Alcohol abuse, uncomplicated: Secondary | ICD-10-CM | POA: Diagnosis present

## 2023-10-25 DIAGNOSIS — E119 Type 2 diabetes mellitus without complications: Secondary | ICD-10-CM

## 2023-10-25 DIAGNOSIS — D649 Anemia, unspecified: Secondary | ICD-10-CM | POA: Insufficient documentation

## 2023-10-25 DIAGNOSIS — F17210 Nicotine dependence, cigarettes, uncomplicated: Secondary | ICD-10-CM | POA: Insufficient documentation

## 2023-10-25 DIAGNOSIS — Z7982 Long term (current) use of aspirin: Secondary | ICD-10-CM | POA: Diagnosis not present

## 2023-10-25 DIAGNOSIS — E1310 Other specified diabetes mellitus with ketoacidosis without coma: Principal | ICD-10-CM

## 2023-10-25 DIAGNOSIS — Z794 Long term (current) use of insulin: Secondary | ICD-10-CM | POA: Insufficient documentation

## 2023-10-25 DIAGNOSIS — M1990 Unspecified osteoarthritis, unspecified site: Secondary | ICD-10-CM | POA: Insufficient documentation

## 2023-10-25 DIAGNOSIS — R079 Chest pain, unspecified: Principal | ICD-10-CM

## 2023-10-25 DIAGNOSIS — M1612 Unilateral primary osteoarthritis, left hip: Secondary | ICD-10-CM | POA: Diagnosis present

## 2023-10-25 DIAGNOSIS — Z1152 Encounter for screening for COVID-19: Secondary | ICD-10-CM | POA: Diagnosis not present

## 2023-10-25 DIAGNOSIS — E1165 Type 2 diabetes mellitus with hyperglycemia: Secondary | ICD-10-CM

## 2023-10-25 DIAGNOSIS — E43 Unspecified severe protein-calorie malnutrition: Secondary | ICD-10-CM | POA: Insufficient documentation

## 2023-10-25 LAB — CBG MONITORING, ED
Glucose-Capillary: 549 mg/dL (ref 70–99)
Glucose-Capillary: 440 mg/dL — ABNORMAL HIGH (ref 70–99)
Glucose-Capillary: 447 mg/dL — ABNORMAL HIGH (ref 70–99)
Glucose-Capillary: 378 mg/dL — ABNORMAL HIGH (ref 70–99)
Glucose-Capillary: 295 mg/dL — ABNORMAL HIGH (ref 70–99)
Glucose-Capillary: 337 mg/dL — ABNORMAL HIGH (ref 70–99)

## 2023-10-25 LAB — I-STAT VENOUS BLOOD GAS, ED
Acid-Base Excess: 1 mmol/L (ref 0.0–2.0)
Bicarbonate: 26.8 mmol/L (ref 20.0–28.0)
Calcium, Ion: 1.13 mmol/L — ABNORMAL LOW (ref 1.15–1.40)
HCT: 37 % (ref 36.0–46.0)
Hemoglobin: 12.6 g/dL (ref 12.0–15.0)
O2 Saturation: 99 %
Potassium: 4.3 mmol/L (ref 3.5–5.1)
Sodium: 134 mmol/L — ABNORMAL LOW (ref 135–145)
TCO2: 28 mmol/L (ref 22–32)
pCO2, Ven: 47.5 mmHg (ref 44–60)
pH, Ven: 7.359 (ref 7.25–7.43)
pO2, Ven: 139 mmHg — ABNORMAL HIGH (ref 32–45)

## 2023-10-25 LAB — URINALYSIS, ROUTINE W REFLEX MICROSCOPIC
Bilirubin Urine: NEGATIVE
Glucose, UA: >=500 mg/dL — AB
Hgb urine dipstick: NEGATIVE
Ketones, ur: >80 mg/dL — AB
Leukocytes,Ua: NEGATIVE
Nitrite: NEGATIVE
Protein, ur: NEGATIVE mg/dL
Specific Gravity, Urine: 1.015 (ref 1.005–1.030)
pH: 6 (ref 5.0–8.0)

## 2023-10-25 LAB — URINALYSIS, MICROSCOPIC (REFLEX)

## 2023-10-25 LAB — RESP PANEL BY RT-PCR (RSV, FLU A&B, COVID)  RVPGX2
Influenza A by PCR: NEGATIVE
Influenza B by PCR: NEGATIVE
Resp Syncytial Virus by PCR: NEGATIVE
SARS Coronavirus 2 by RT PCR: NEGATIVE

## 2023-10-25 LAB — CBC
HCT: 37.7 % (ref 36.0–46.0)
Hemoglobin: 12.9 g/dL (ref 12.0–15.0)
MCH: 31.9 pg (ref 26.0–34.0)
MCHC: 34.2 g/dL (ref 30.0–36.0)
MCV: 93.3 fL (ref 80.0–100.0)
Platelets: 238 10*3/uL (ref 150–400)
RBC: 4.04 MIL/uL (ref 3.87–5.11)
RDW: 11.8 % (ref 11.5–15.5)
WBC: 7.7 10*3/uL (ref 4.0–10.5)
nRBC: 0 % (ref 0.0–0.2)

## 2023-10-25 LAB — HCG, SERUM, QUALITATIVE: Preg, Serum: NEGATIVE

## 2023-10-25 LAB — TROPONIN I (HIGH SENSITIVITY)
Troponin I (High Sensitivity): 6 ng/L (ref <18)
Troponin I (High Sensitivity): 6 ng/L (ref <18)

## 2023-10-25 LAB — BASIC METABOLIC PANEL WITH GFR
Anion gap: 14 (ref 5–15)
BUN: 16 mg/dL (ref 6–20)
CO2: 24 mmol/L (ref 22–32)
Calcium: 8.6 mg/dL — ABNORMAL LOW (ref 8.9–10.3)
Chloride: 97 mmol/L — ABNORMAL LOW (ref 98–111)
Creatinine, Ser: 1.01 mg/dL — ABNORMAL HIGH (ref 0.44–1.00)
GFR, Estimated: >60 mL/min (ref >60)
Glucose, Bld: 604 mg/dL (ref 70–99)
Potassium: 4.2 mmol/L (ref 3.5–5.1)
Sodium: 135 mmol/L (ref 135–145)

## 2023-10-25 LAB — BRAIN NATRIURETIC PEPTIDE: B Natriuretic Peptide: 27 pg/mL (ref 0.0–100.0)

## 2023-10-25 LAB — TSH: TSH: 1.671 u[IU]/mL (ref 0.350–4.500)

## 2023-10-25 LAB — BETA-HYDROXYBUTYRIC ACID: Beta-Hydroxybutyric Acid: 3.89 mmol/L — ABNORMAL HIGH (ref 0.05–0.27)

## 2023-10-25 MED ORDER — DEXTROSE 50 % IV SOLN: 0.0000 mL | INTRAVENOUS | Status: DC | PRN

## 2023-10-25 MED ORDER — LACTATED RINGERS IV SOLN: INTRAVENOUS | Status: DC

## 2023-10-25 MED ORDER — INSULIN REGULAR(HUMAN) IN NACL 100-0.9 UT/100ML-% IV SOLN
INTRAVENOUS | Status: DC
  Administered 2023-10-25: 9 [IU]/h via INTRAVENOUS
  Filled 2023-10-25 (×2): qty 100

## 2023-10-25 MED ORDER — DEXTROSE IN LACTATED RINGERS 5 % IV SOLN: INTRAVENOUS | Status: DC

## 2023-10-25 MED ORDER — OXYCODONE-ACETAMINOPHEN 5-325 MG PO TABS: 1.0000 | ORAL_TABLET | ORAL | 0 refills | Status: DC | PRN

## 2023-10-25 NOTE — Discharge Instructions (Addendum)

## 2023-10-25 NOTE — ED Notes (Signed)
 Pt given sandwhich bag, cleared with edp

## 2023-10-25 NOTE — ED Triage Notes (Signed)
 Pt BIB EMS with c/o left sided chest pain that started 2 days ago. Pt has chronic pain and pain is worse with movement. Pt is hyperglycemic and hasn't taken her diabetic meds in months.    120/72 CBG read HIGH 4mg  Zofran   500cc NS

## 2023-10-25 NOTE — ED Provider Notes (Signed)
 [place] EMERGENCY DEPARTMENT AT [facility] Provider Note   CSN: [id number] Arrival date & time: 10/25/23  1656     History  Chief Complaint  Patient presents with   Chest Pain   Hyperglycemia    Nancy [name] is a 58 y.o. female.  The history is provided by the patient and medical records. No language interpreter was used.  Chest Pain Pain location:  L chest Pain quality: aching   Pain severity:  Severe Onset quality:  Gradual Timing:  Constant Progression:  Waxing and waning Chronicity:  Recurrent Context: not trauma   Relieved by:  Nothing Worsened by:  Nothing Ineffective treatments:  None tried Associated symptoms: abdominal pain, back pain, cough, fatigue, nausea, palpitations, shortness of breath and vomiting   Associated symptoms: no fever, no headache, no lower extremity edema, no numbness and no weakness   Hyperglycemia Associated symptoms: abdominal pain, chest pain, dysuria, fatigue, nausea, shortness of breath and vomiting   Associated symptoms: no confusion, no fever and no weakness        Home Medications Prior to Admission medications   Medication Sig Start Date End Date Taking? Authorizing Provider  albuterol  (VENTOLIN  HFA) 108 (90 Base) MCG/ACT inhaler Inhale 1-2 puffs into the lungs every 6 (six) hours as needed for wheezing or shortness of breath. 02/09/19   Palumbo, April, MD  Blood Glucose Monitoring Suppl (ACCU-CHEK AVIVA PLUS) w/Device KIT USE AS INSTRUCTED 07/19/18   Newlin, Enobong, MD  doxycycline  (VIBRAMYCIN ) 100 MG capsule Take 1 capsule (100 mg total) by mouth 2 (two) times daily. 02/09/19   Palumbo, April, MD  insulin  glargine (LANTUS ) 100 UNIT/ML injection Inject 0.25 mLs (25 Units total) into the skin 2 (two) times daily. 07/15/18   Molpus, John, MD  Insulin  Syringe-Needle U-100 (INSULIN  SYRINGE 1CC/30GX1/2") 30G X 1/2" 1 ML MISC 1 application by Does not apply route daily. 07/15/18   Molpus, Autry Legions, MD  metFORMIN  (GLUCOPHAGE )  1000 MG tablet Take 1 tablet (1,000 mg total) by mouth 2 (two) times daily with a meal. 07/15/18   Molpus, John, MD  naproxen  (NAPROSYN ) 375 MG tablet Take 1 tablet (375 mg total) by mouth 2 (two) times daily with a meal. 02/09/19   Palumbo, April, MD      Allergies    No known allergies    Review of Systems   Review of Systems  Constitutional:  Positive for fatigue. Negative for chills and fever.  HENT:  Negative for congestion.   Respiratory:  Positive for cough, chest tightness and shortness of breath.   Cardiovascular:  Positive for chest pain and palpitations.  Gastrointestinal:  Positive for abdominal pain, nausea and vomiting. Negative for constipation and diarrhea.  Genitourinary:  Positive for dysuria and frequency. Negative for flank pain.  Musculoskeletal:  Positive for back pain. Negative for neck pain and neck stiffness.  Skin:  Negative for rash and wound.  Neurological:  Positive for light-headedness. Negative for weakness, numbness and headaches.  Psychiatric/Behavioral:  Negative for agitation and confusion.   All other systems reviewed and are negative.   Physical Exam Updated Vital Signs BP 112/74 (BP Location: Right Arm)   Pulse 86   Temp 97.6 F (36.4 C) (Oral)   Resp 14   Wt 74.8 kg   SpO2 100%   BMI 27.46 kg/m  Physical Exam Vitals and nursing note reviewed.  Constitutional:      General: She is not in acute distress.    Appearance: She is well-developed. She is  not ill-appearing, toxic-appearing or diaphoretic.  HENT:     Head: Normocephalic and atraumatic.  Eyes:     Conjunctiva/sclera: Conjunctivae normal.     Pupils: Pupils are equal, round, and reactive to light.  Cardiovascular:     Rate and Rhythm: Normal rate and regular rhythm.     Heart sounds: Normal heart sounds. No murmur heard. Pulmonary:     Effort: Pulmonary effort is normal. No respiratory distress.     Breath sounds: Normal breath sounds. No wheezing, rhonchi or rales.  Chest:      Chest wall: No tenderness.  Abdominal:     Palpations: Abdomen is soft.     Tenderness: There is no abdominal tenderness.  Musculoskeletal:        General: No swelling.     Cervical back: Neck supple.     Right lower leg: No edema.     Left lower leg: No tenderness. No edema.  Skin:    General: Skin is warm and dry.     Capillary Refill: Capillary refill takes less than 2 seconds.     Findings: No erythema.  Neurological:     General: No focal deficit present.     Mental Status: She is alert.  Psychiatric:        Mood and Affect: Mood normal.     ED Results / Procedures / Treatments   Labs (all labs ordered are listed, but only abnormal results are displayed) Labs Reviewed  BASIC METABOLIC PANEL WITH GFR - Abnormal; Notable for the following components:      Result Value   Chloride 97 (*)    Glucose, Bld 604 (*)    Creatinine, Ser 1.01 (*)    Calcium  8.6 (*)    All other components within normal limits  URINALYSIS, ROUTINE W REFLEX MICROSCOPIC - Abnormal; Notable for the following components:   Glucose, UA >=500 (*)    Ketones, ur >80 (*)    All other components within normal limits  BETA-HYDROXYBUTYRIC ACID - Abnormal; Notable for the following components:   Beta-Hydroxybutyric Acid 3.89 (*)    All other components within normal limits  URINALYSIS, MICROSCOPIC (REFLEX) - Abnormal; Notable for the following components:   Bacteria, UA RARE (*)    All other components within normal limits  CBG MONITORING, ED - Abnormal; Notable for the following components:   Glucose-Capillary 549 (*)    All other components within normal limits  I-STAT VENOUS BLOOD GAS, ED - Abnormal; Notable for the following components:   pO2, Ven 139 (*)    Sodium 134 (*)    Calcium , Ion 1.13 (*)    All other components within normal limits  CBG MONITORING, ED - Abnormal; Notable for the following components:   Glucose-Capillary 447 (*)    All other components within normal limits  CBG  MONITORING, ED - Abnormal; Notable for the following components:   Glucose-Capillary 440 (*)    All other components within normal limits  CBG MONITORING, ED - Abnormal; Notable for the following components:   Glucose-Capillary 378 (*)    All other components within normal limits  CBG MONITORING, ED - Abnormal; Notable for the following components:   Glucose-Capillary 295 (*)    All other components within normal limits  CBG MONITORING, ED - Abnormal; Notable for the following components:   Glucose-Capillary 337 (*)    All other components within normal limits  RESP PANEL BY RT-PCR (RSV, FLU A&B, COVID)  RVPGX2  CBC  HCG, SERUM,  QUALITATIVE  BRAIN NATRIURETIC PEPTIDE  TSH  D-DIMER, QUANTITATIVE  TROPONIN I (HIGH SENSITIVITY)  TROPONIN I (HIGH SENSITIVITY)    EKG EKG Interpretation Date/Time:  Tuesday Oct 25 2023 17:04:04 EDT Ventricular Rate:  85 PR Interval:  121 QRS Duration:  87 QT Interval:  400 QTC Calculation: 476 R Axis:   78  Text Interpretation: Sinus rhythm Anteroseptal infarct, age indeterminate when compared top rior, similar appearance No STEMI Confirmed by Wynell Heath ([id number]) on 10/25/2023 6:31:53 PM  Radiology DG Chest 2 View Result Date: 10/25/2023 CLINICAL DATA:  CP EXAM: CHEST - 2 VIEW COMPARISON:  None available. FINDINGS: No focal airspace consolidation, pleural effusion, or pneumothorax. No cardiomegaly. No acute fracture or destructive lesion. Multilevel thoracic osteophytosis. IMPRESSION: No acute cardiopulmonary abnormality. Electronically Signed   By: Rance Burrows M.D.   On: 10/25/2023 17:56    Procedures Procedures    Medications Ordered in ED Medications  insulin  regular, human (MYXREDLIN ) 100 units/ 100 mL infusion (13 Units/hr Intravenous Rate/Dose Change 10/25/23 2335)  lactated ringers  infusion ( Intravenous New Bag/Given 10/25/23 1951)  dextrose  5 % in lactated ringers  infusion (has no administration in time range)  dextrose  50 % solution  0-50 mL (has no administration in time range)    ED Course/ Medical Decision Making/ A&P                                 Medical Decision Making Amount and/or Complexity of Data Reviewed Labs: ordered. Radiology: ordered.  Risk Prescription drug management. Decision regarding hospitalization.    SHIRLEY OLEKSIAK is a 58 y.o. female with a past medical history significant for GERD, headaches, polysubstance abuse, uncontrolled diabetes, asthma, and hyperlipidemia who presents for multiple complaints including fatigue, dysuria, urinary frequency, hyperglycemia, lightheadedness, diffuse pains, and chest pain.  According to patient, she has not been on her glucose medicines for many months and she cannot afford them.  She said that the last few days she started having chest pain that is in her left chest.  She reports it is exertional and pleuritic.  She does report coughing up some black substances but does not think it looks like blood.  She reports nausea but no vomiting.  She reports some chronic diffuse pain throughout the rest of the body.  She reports pains in her extremities with neuropathies and she is concerned about her sugars.  She does have some urinary symptoms but denies constipation or diarrhea.  On my exam, lungs were clear.  Chest was slightly tender.  Abdomen not focally tender bowel sounds were appreciated.  Moving all extremities.  Poor toenail hygiene but do not see evidence of acute cellulitis initially as she was concerned about.  Dry mouth and dry mucous membranes.  EKG does not show STEMI.  Clinically concerned about DKA versus Szymon hyperglycemia as her glucose reportedly just read high in triage.  UTI considered given her reported urinary symptoms.  Will get a D-dimer given her pleuritic and exertional chest pain and shortness of breath and possible hemoptysis with the black sputum.  Workup began to return.  Glucose found to be over 600.  Glucose stabilizer was  ordered.  She was negative for COVID/flu/RSV.  Initial troponin is normal.  CBC reassuring and her metabolic panel does show anion gap of 14.  Do not feel she has full DKA but she does have an elevated beta lactic acid and if she has greater than  80 ketones in her urine.  Her urinalysis did not show nitrites or leukocytes so do not think she has a UTI.  Due to her multitude of symptoms, uncontrolled diabetes with hyperglycemia, dehydration, I do feel she needs admission.  Anticipate diabetes coordinator likely to see patient to help get her sorted out from a Gluco standpoint.  She is still waiting on the results of the D-dimer given the chest complaints and if this is positive she will need a CT PE study.  Anticipate admission.  Patient will be admitted for further management of hyperglycemia and other symptoms.        Final Clinical Impression(s) / ED Diagnoses Final diagnoses:  Diabetic ketoacidosis without coma associated with other specified diabetes mellitus (HCC)     Clinical Impression: 1. Diabetic ketoacidosis without coma associated with other specified diabetes mellitus (HCC)     Disposition: Admit  This note was prepared with assistance of Dragon voice recognition software. Occasional wrong-word or sound-a-like substitutions may have occurred due to the inherent limitations of voice recognition software.     Harold Moncus, Marine Sia, MD 10/25/23 [phone]

## 2023-10-26 ENCOUNTER — Encounter (HOSPITAL_COMMUNITY): Payer: Self-pay | Admitting: Internal Medicine

## 2023-10-26 ENCOUNTER — Observation Stay (HOSPITAL_COMMUNITY)

## 2023-10-26 ENCOUNTER — Observation Stay (HOSPITAL_COMMUNITY): Admit: 2023-10-26 | Discharge: 2023-10-26 | Disposition: A | Discharge status: Home / Self Care | Attending: Cardiology

## 2023-10-26 ENCOUNTER — Other Ambulatory Visit: Payer: Self-pay | Admitting: Cardiology

## 2023-10-26 DIAGNOSIS — E1110 Type 2 diabetes mellitus with ketoacidosis without coma: Secondary | ICD-10-CM | POA: Diagnosis not present

## 2023-10-26 DIAGNOSIS — D649 Anemia, unspecified: Secondary | ICD-10-CM | POA: Insufficient documentation

## 2023-10-26 DIAGNOSIS — R079 Chest pain, unspecified: Secondary | ICD-10-CM

## 2023-10-26 DIAGNOSIS — E1010 Type 1 diabetes mellitus with ketoacidosis without coma: Secondary | ICD-10-CM | POA: Insufficient documentation

## 2023-10-26 DIAGNOSIS — N179 Acute kidney failure, unspecified: Secondary | ICD-10-CM

## 2023-10-26 DIAGNOSIS — R931 Abnormal findings on diagnostic imaging of heart and coronary circulation: Secondary | ICD-10-CM

## 2023-10-26 DIAGNOSIS — F1010 Alcohol abuse, uncomplicated: Secondary | ICD-10-CM

## 2023-10-26 DIAGNOSIS — I2510 Atherosclerotic heart disease of native coronary artery without angina pectoris: Secondary | ICD-10-CM

## 2023-10-26 DIAGNOSIS — M1990 Unspecified osteoarthritis, unspecified site: Secondary | ICD-10-CM | POA: Insufficient documentation

## 2023-10-26 DIAGNOSIS — E876 Hypokalemia: Secondary | ICD-10-CM | POA: Insufficient documentation

## 2023-10-26 LAB — BASIC METABOLIC PANEL WITH GFR
Anion gap: 11 (ref 5–15)
Anion gap: 10 (ref 5–15)
Anion gap: 9 (ref 5–15)
BUN: 16 mg/dL (ref 6–20)
BUN: 17 mg/dL (ref 6–20)
BUN: 12 mg/dL (ref 6–20)
CO2: 25 mmol/L (ref 22–32)
CO2: 25 mmol/L (ref 22–32)
CO2: 28 mmol/L (ref 22–32)
Calcium: 8.9 mg/dL (ref 8.9–10.3)
Calcium: 8.5 mg/dL — ABNORMAL LOW (ref 8.9–10.3)
Calcium: 8.8 mg/dL — ABNORMAL LOW (ref 8.9–10.3)
Chloride: 100 mmol/L (ref 98–111)
Chloride: 100 mmol/L (ref 98–111)
Chloride: 95 mmol/L — ABNORMAL LOW (ref 98–111)
Creatinine, Ser: 0.5 mg/dL (ref 0.44–1.00)
Creatinine, Ser: 0.47 mg/dL (ref 0.44–1.00)
Creatinine, Ser: 0.69 mg/dL (ref 0.44–1.00)
GFR, Estimated: >60 mL/min (ref >60)
GFR, Estimated: >60 mL/min (ref >60)
GFR, Estimated: >60 mL/min (ref >60)
Glucose, Bld: 230 mg/dL — ABNORMAL HIGH (ref 70–99)
Glucose, Bld: 172 mg/dL — ABNORMAL HIGH (ref 70–99)
Glucose, Bld: 493 mg/dL — ABNORMAL HIGH (ref 70–99)
Potassium: 3.1 mmol/L — ABNORMAL LOW (ref 3.5–5.1)
Potassium: 3.7 mmol/L (ref 3.5–5.1)
Potassium: 3.7 mmol/L (ref 3.5–5.1)
Sodium: 136 mmol/L (ref 135–145)
Sodium: 135 mmol/L (ref 135–145)
Sodium: 132 mmol/L — ABNORMAL LOW (ref 135–145)

## 2023-10-26 LAB — CBC
HCT: 34.3 % — ABNORMAL LOW (ref 36.0–46.0)
Hemoglobin: 11.7 g/dL — ABNORMAL LOW (ref 12.0–15.0)
MCH: 31.6 pg (ref 26.0–34.0)
MCHC: 34.1 g/dL (ref 30.0–36.0)
MCV: 92.7 fL (ref 80.0–100.0)
Platelets: 204 10*3/uL (ref 150–400)
RBC: 3.7 MIL/uL — ABNORMAL LOW (ref 3.87–5.11)
RDW: 11.8 % (ref 11.5–15.5)
WBC: 8.3 10*3/uL (ref 4.0–10.5)
nRBC: 0 % (ref 0.0–0.2)

## 2023-10-26 LAB — GLUCOSE, CAPILLARY
Glucose-Capillary: 509 mg/dL (ref 70–99)
Glucose-Capillary: 572 mg/dL (ref 70–99)

## 2023-10-26 LAB — CBG MONITORING, ED
Glucose-Capillary: 302 mg/dL — ABNORMAL HIGH (ref 70–99)
Glucose-Capillary: 247 mg/dL — ABNORMAL HIGH (ref 70–99)
Glucose-Capillary: 212 mg/dL — ABNORMAL HIGH (ref 70–99)
Glucose-Capillary: 202 mg/dL — ABNORMAL HIGH (ref 70–99)
Glucose-Capillary: 183 mg/dL — ABNORMAL HIGH (ref 70–99)
Glucose-Capillary: 163 mg/dL — ABNORMAL HIGH (ref 70–99)

## 2023-10-26 LAB — FOLATE: Folate: 15.3 ng/mL (ref >5.9)

## 2023-10-26 LAB — ECHOCARDIOGRAM COMPLETE
Area-P 1/2: 3.17 cm2
Calc EF: 59.6 %
Height: 69 in
S' Lateral: 3.1 cm
Single Plane A2C EF: 54.2 %
Single Plane A4C EF: 67 %
Weight: 2640 [oz_av]

## 2023-10-26 LAB — VITAMIN B12: Vitamin B-12: 430 pg/mL (ref 180–914)

## 2023-10-26 LAB — BETA-HYDROXYBUTYRIC ACID
Beta-Hydroxybutyric Acid: 0.09 mmol/L (ref 0.05–0.27)
Beta-Hydroxybutyric Acid: 0.06 mmol/L (ref 0.05–0.27)

## 2023-10-26 LAB — HIV ANTIBODY (ROUTINE TESTING W REFLEX): HIV Screen 4th Generation wRfx: NONREACTIVE

## 2023-10-26 LAB — GLUCOSE, RANDOM: Glucose, Bld: 527 mg/dL (ref 70–99)

## 2023-10-26 MED ORDER — CYCLOBENZAPRINE HCL 10 MG PO TABS
10.0000 mg | ORAL_TABLET | Freq: Three times a day (TID) | ORAL | Status: DC | PRN
  Administered 2023-10-26 – 2023-10-28 (×6): 10 mg via ORAL
  Filled 2023-10-26 (×6): qty 1

## 2023-10-26 MED ORDER — LORAZEPAM 1 MG PO TABS: 1.0000 mg | ORAL_TABLET | ORAL | Status: DC | PRN

## 2023-10-26 MED ORDER — FOLIC ACID 1 MG PO TABS
1.0000 mg | ORAL_TABLET | Freq: Every day | ORAL | Status: DC
  Administered 2023-10-26 – 2023-10-28 (×3): 1 mg via ORAL
  Filled 2023-10-26 (×3): qty 1

## 2023-10-26 MED ORDER — INSULIN REGULAR(HUMAN) IN NACL 100-0.9 UT/100ML-% IV SOLN
INTRAVENOUS | Status: DC
  Administered 2023-10-26: 7.5 [IU]/h via INTRAVENOUS
  Filled 2023-10-26: qty 100

## 2023-10-26 MED ORDER — IBUPROFEN 400 MG PO TABS
400.0000 mg | ORAL_TABLET | Freq: Once | ORAL | Status: AC
Start: 2023-10-26 — End: 2023-10-26
  Administered 2023-10-26: 400 mg via ORAL
  Filled 2023-10-26: qty 1

## 2023-10-26 MED ORDER — LORAZEPAM 2 MG/ML IJ SOLN: 1.0000 mg | INTRAMUSCULAR | Status: DC | PRN

## 2023-10-26 MED ORDER — DEXTROSE IN LACTATED RINGERS 5 % IV SOLN: INTRAVENOUS | Status: DC

## 2023-10-26 MED ORDER — ESCITALOPRAM OXALATE 10 MG PO TABS
10.0000 mg | ORAL_TABLET | Freq: Every day | ORAL | Status: DC
Start: 2023-10-26 — End: 2023-10-28
  Administered 2023-10-26 – 2023-10-28 (×3): 10 mg via ORAL
  Filled 2023-10-26 (×3): qty 1

## 2023-10-26 MED ORDER — DEXTROSE 50 % IV SOLN: 0.0000 mL | INTRAVENOUS | Status: DC | PRN

## 2023-10-26 MED ORDER — ADULT MULTIVITAMIN W/MINERALS CH
1.0000 | ORAL_TABLET | Freq: Every day | ORAL | Status: DC
  Administered 2023-10-26 – 2023-10-28 (×3): 1 via ORAL
  Filled 2023-10-26 (×3): qty 1

## 2023-10-26 MED ORDER — ACETAMINOPHEN 325 MG PO TABS
650.0000 mg | ORAL_TABLET | ORAL | Status: DC | PRN
  Administered 2023-10-26 – 2023-10-28 (×7): 650 mg via ORAL
  Filled 2023-10-26 (×7): qty 2

## 2023-10-26 MED ORDER — IOHEXOL 350 MG/ML SOLN
75.0000 mL | Freq: Once | INTRAVENOUS | Status: AC | PRN
  Administered 2023-10-26: 75 mL via INTRAVENOUS

## 2023-10-26 MED ORDER — INSULIN ASPART 100 UNIT/ML IJ SOLN
20.0000 [IU] | Freq: Once | INTRAMUSCULAR | Status: AC
  Administered 2023-10-26: 20 [IU] via SUBCUTANEOUS

## 2023-10-26 MED ORDER — ENOXAPARIN SODIUM 40 MG/0.4ML IJ SOSY
40.0000 mg | PREFILLED_SYRINGE | Freq: Every day | INTRAMUSCULAR | Status: DC
  Administered 2023-10-26 – 2023-10-28 (×3): 40 mg via SUBCUTANEOUS
  Filled 2023-10-26 (×3): qty 0.4

## 2023-10-26 MED ORDER — METOPROLOL TARTRATE 5 MG/5ML IV SOLN
INTRAVENOUS | Status: AC
  Filled 2023-10-26: qty 10

## 2023-10-26 MED ORDER — LACTATED RINGERS IV SOLN: INTRAVENOUS | Status: DC

## 2023-10-26 MED ORDER — POTASSIUM CHLORIDE 20 MEQ PO PACK
40.0000 meq | PACK | Freq: Once | ORAL | Status: AC
  Administered 2023-10-26: 40 meq via ORAL
  Filled 2023-10-26: qty 2

## 2023-10-26 MED ORDER — NITROGLYCERIN 0.4 MG SL SUBL
SUBLINGUAL_TABLET | SUBLINGUAL | Status: AC
  Filled 2023-10-26: qty 2

## 2023-10-26 MED ORDER — INSULIN ASPART 100 UNIT/ML IJ SOLN
0.0000 [IU] | Freq: Every day | INTRAMUSCULAR | Status: DC
  Administered 2023-10-26 – 2023-10-27 (×2): 5 [IU] via SUBCUTANEOUS

## 2023-10-26 MED ORDER — THIAMINE HCL 100 MG/ML IJ SOLN
100.0000 mg | Freq: Every day | INTRAMUSCULAR | Status: DC
  Filled 2023-10-26: qty 2

## 2023-10-26 MED ORDER — THIAMINE MONONITRATE 100 MG PO TABS
100.0000 mg | ORAL_TABLET | Freq: Every day | ORAL | Status: DC
  Administered 2023-10-26 – 2023-10-28 (×3): 100 mg via ORAL
  Filled 2023-10-26 (×3): qty 1

## 2023-10-26 MED ORDER — IOHEXOL 350 MG/ML SOLN: 75.0000 mL | Freq: Once | INTRAVENOUS | Status: DC | PRN

## 2023-10-26 MED ORDER — ALBUTEROL SULFATE HFA 108 (90 BASE) MCG/ACT IN AERS: 1.0000 | INHALATION_SPRAY | RESPIRATORY_TRACT | Status: DC | PRN

## 2023-10-26 MED ORDER — INSULIN ASPART 100 UNIT/ML IJ SOLN
0.0000 [IU] | Freq: Three times a day (TID) | INTRAMUSCULAR | Status: DC
  Administered 2023-10-26: 3 [IU] via SUBCUTANEOUS
  Administered 2023-10-26 – 2023-10-27 (×2): 15 [IU] via SUBCUTANEOUS
  Administered 2023-10-27: 8 [IU] via SUBCUTANEOUS
  Administered 2023-10-27: 15 [IU] via SUBCUTANEOUS

## 2023-10-26 MED ORDER — INSULIN GLARGINE-YFGN 100 UNIT/ML ~~LOC~~ SOLN
10.0000 [IU] | Freq: Every day | SUBCUTANEOUS | Status: DC
  Administered 2023-10-26: 10 [IU] via SUBCUTANEOUS
  Filled 2023-10-26 (×2): qty 0.1

## 2023-10-26 MED ORDER — IOHEXOL 350 MG/ML SOLN
100.0000 mL | Freq: Once | INTRAVENOUS | Status: AC | PRN
  Administered 2023-10-26: 100 mL via INTRAVENOUS

## 2023-10-26 NOTE — Assessment & Plan Note (Addendum)
-   With underlying alcohol abuse, at risk for folate or B12 deficiency; both normal

## 2023-10-26 NOTE — ED Notes (Signed)
 [employer] Insulin  drip and LRD5 stopped two hours after long acting administration per provider order

## 2023-10-26 NOTE — Assessment & Plan Note (Signed)
-   Noted in her hips.  Follows outpatient with Dr. Lucienne Ryder, orthopedic surgery

## 2023-10-26 NOTE — Consult Note (Addendum)
 Cardiology Consultation   Patient ID: Nancy [name] MRN: [id number]; DOB: 11/02/1965  Admit date: 10/25/2023 Date of Consult: 10/26/2023  PCP:  Harry Lindau, FNP   [facility] Providers Cardiologist:  None   {  Patient Profile:   Nancy [name] is a 58 y.o. female with a hx of type 2 diabetes, bilateral osteoarthritis of the hips, prior substance abuse, alcohol use who is being seen 10/26/2023 for the evaluation of chest pain at the request of Dr. [facility] Lavender.  History of Present Illness:   Ms. Ball reports no prior cardiac history.  Reports no family history of CAD.  She smokes 1 pack/day.  Reports smoking since the age of 62.  Drinks at least 1 beer daily.  Prior history of substance abuse with cocaine and alcohol but denies any current use.  Reportedly has been out of her medications for over a year.  Currently patient being evaluated for chest pain and DKA.  Started on IV insulin  and DKA protocol.  Negative troponins.  Patient reports that 2 days ago she was sitting while watching television and suddenly had a episode of chest pain that she described as being heavy with multiple "strikes" and spasms.  Has not had any prior complaints of this and no exertional symptoms.  She reported some numbness in her left hand however this has continued to persist and somewhat chronic for her.  She reports a multitude of very symptoms that include cough, diffuse pain, palpitations, peripheral edema, shortness of breath.  No orthopnea.  She says she is homeless and takes care of her of a 57 year old but is very functionally incapable.  Glucose on arrival as high as 604, anion gap 14 and elevated hydroxybutyrate 3.8 with ketones.  CTA negative for PE, coronary artery calcifications noted.  Emphysema.  Normal BNP.  Normal TSH.  Potassium 3.7 normal creatinine.  Hemoglobin 11.7.   Past Medical History:  Diagnosis Date   Abnormal Pap smear    Bronchitis    Depression    Diabetes (HCC)     GERD (gastroesophageal reflux disease)    tums prn   Headache(784.0)    Polysubstance abuse (HCC)     Past Surgical History:  Procedure Laterality Date   CERVICAL CONIZATION W/BX  12/13/2011   Procedure: CONIZATION CERVIX WITH BIOPSY;  Surgeon: Ana Balling, MD;  Location: [facility];  Service: Gynecology;  Laterality: N/A;   DENTAL SURGERY       Inpatient Medications: Scheduled Meds:  enoxaparin (LOVENOX) injection  40 mg Subcutaneous Daily   escitalopram   10 mg Oral Daily   folic acid  1 mg Oral Daily   insulin  aspart  0-15 Units Subcutaneous TID WC   insulin  aspart  0-5 Units Subcutaneous QHS   insulin  glargine-yfgn  10 Units Subcutaneous Daily   multivitamin with minerals  1 tablet Oral Daily   thiamine   100 mg Oral Daily   Or   thiamine   100 mg Intravenous Daily   Continuous Infusions:  PRN Meds: acetaminophen , cyclobenzaprine , dextrose , iohexol, LORazepam  **OR** LORazepam   Allergies:    Allergies  Allergen Reactions   No Known Allergies     Social History:   Social History   Socioeconomic History   Marital status: Single    Spouse name: Not on file   Number of children: Not on file   Years of education: Not on file   Highest education level: Not on file  Occupational History   Not on file  Tobacco  Use   Smoking status: Every Day    Current packs/day: 1.00    Average packs/day: 1 pack/day for 20.0 years (20.0 ttl pk-yrs)    Types: Cigarettes   Smokeless tobacco: Never  Substance and Sexual Activity   Alcohol use: Yes    Alcohol/week: 7.0 standard drinks of alcohol    Types: 7 Cans of beer per week    Comment: couple of beers a week   Drug use: Not Currently    Types: "Crack" cocaine, Marijuana    Comment: Pills(Vicodin) heroin (states no today 09/04/15)   Sexual activity: Yes    Birth control/protection: None    Comment: Pt in rehab for drug and alcohol use  Other Topics Concern   Not on file  Social History Narrative   Not on file   Social  Drivers of Health   Financial Resource Strain: Not on File (10/08/2021)   Received from [employer], [employer]    Financial Resource Strain: 0  Food Insecurity: Not on File (03/17/2023)   Received from [facility] Insecurity    Food: 0  Transportation Needs: Not on File (10/08/2021)   Received from [employer], [employer] Needs    Transportation: 0  Physical Activity: Not on File (10/08/2021)   Received from [place], [place]   Physical Activity    Physical Activity: 0  Stress: Not on File (10/08/2021)   Received from [facility], [place]   Stress    Stress: 0  Social Connections: Not on File (03/05/2023)   Received from [employer]   Social Connections    Connectedness: 0  Intimate Partner Violence: Not on file    Family History:   Family History  Problem Relation Age of Onset   Cancer Mother        unsure     ROS:  Please see the history of present illness.  All other ROS reviewed and negative.     Physical Exam/Data:   Vitals:   10/26/23 0520 10/26/23 0800 10/26/23 0939 10/26/23 1100  BP:  120/78  (!) 118/94  Pulse:  80  78  Resp:  15  17  Temp: (!) 97.4 F (36.3 C)  98.1 F (36.7 C)   TempSrc: Oral  Oral   SpO2:  100%  98%  Weight:      Height:        Intake/Output Summary (Last 24 hours) at 10/26/2023 1229 Last data filed at 10/26/2023 0604 Gross per 24 hour  Intake 98.7 ml  Output --  Net 98.7 ml      10/25/2023    5:03 PM 02/19/2019    9:41 AM 02/08/2019    7:02 PM  Last 3 Weights  Weight (lbs) 165 lb 165 lb 165 lb  Weight (kg) 74.844 kg 74.844 kg 74.844 kg     Body mass index is 24.37 kg/m.  General: Appears much older than stated age.  Coughing. HEENT: normal Neck: + JVD Vascular: No carotid bruits; Distal pulses 2+ bilaterally Cardiac:  normal S1, S2; RRR; no murmur.  Distant heart sounds Lungs:  clear to auscultation bilaterally, no wheezing, rhonchi or rales  Abd: soft, nontender, no hepatomegaly  Ext: no edema Musculoskeletal:  No  deformities, BUE and BLE strength normal and equal Skin: warm and dry  Neuro:  CNs 2-12 intact, no focal abnormalities noted Psych:  Normal affect   EKG:  The EKG was personally reviewed and demonstrates: Sinus rhythm heart rate 85.  No acute  ST-T wave changes. Telemetry:  Telemetry was personally reviewed and demonstrates: Sinus rhythm heart rates in the 80s  Relevant CV Studies: Echocardiogram pending  Laboratory Data:  High Sensitivity Troponin:   Recent Labs  Lab 10/25/23 1738 10/25/23 2000  TROPONINIHS 6 6     Chemistry Recent Labs  Lab 10/25/23 1738 10/25/23 1853 10/26/23 0220 10/26/23 0614  NA 135 134* 136 135  K 4.2 4.3 3.1* 3.7  CL 97*  --  100 100  CO2 24  --  25 25  GLUCOSE 604*  --  230* 172*  BUN 16  --  16 17  CREATININE 1.01*  --  0.50 0.47  CALCIUM  8.6*  --  8.9 8.5*  GFRNONAA >60  --  >60 >60  ANIONGAP 14  --  11 10    No results for input(s): "PROT", "ALBUMIN", "AST", "ALT", "ALKPHOS", "BILITOT" in the last 168 hours. Lipids No results for input(s): "CHOL", "TRIG", "HDL", "LABVLDL", "LDLCALC", "CHOLHDL" in the last 168 hours.  Hematology Recent Labs  Lab 10/25/23 1738 10/25/23 1853 10/26/23 0145  WBC 7.7  --  8.3  RBC 4.04  --  3.70*  HGB 12.9 12.6 11.7*  HCT 37.7 37.0 34.3*  MCV 93.3  --  92.7  MCH 31.9  --  31.6  MCHC 34.2  --  34.1  RDW 11.8  --  11.8  PLT 238  --  204   Thyroid   Recent Labs  Lab 10/25/23 1735  TSH 1.671    BNP Recent Labs  Lab 10/25/23 1735  BNP 27.0    DDimer No results for input(s): "DDIMER" in the last 168 hours.   Radiology/Studies:  CT Angio Chest Pulmonary Embolism (PE) W or WO Contrast Result Date: 10/26/2023 CLINICAL DATA:  Evaluate for acute pulmonary embolism. High probability. Chest pain. EXAM: CT ANGIOGRAPHY CHEST WITH CONTRAST TECHNIQUE: Multidetector CT imaging of the chest was performed using the standard protocol during bolus administration of intravenous contrast. Multiplanar CT image  reconstructions and MIPs were obtained to evaluate the vascular anatomy. RADIATION DOSE REDUCTION: This exam was performed according to the departmental dose-optimization program which includes automated exposure control, adjustment of the mA and/or kV according to patient size and/or use of iterative reconstruction technique. CONTRAST:  75mL OMNIPAQUE IOHEXOL 350 MG/ML SOLN COMPARISON:  None Available. FINDINGS: Cardiovascular: Satisfactory opacification of the pulmonary arteries to the segmental level. No evidence of pulmonary embolism. Normal heart size. No pericardial effusion. Aortic atherosclerosis. Coronary artery calcifications. Mediastinum/Nodes: Thyroid  gland and trachea appear normal. Circumferential wall thickening involving the distal esophagus noted. No mediastinal or hilar adenopathy. Lungs/Pleura: Emphysema with diffuse bronchial wall thickening. Mild interlobular septal thickening noted within the lower lung zones. Dependent atelectasis noted within the lung bases peripherally. No pleural effusion, pneumothorax or airspace consolidation. Perifissural nodule along the minor fissure measures 4 mm, image 88/6. Upper Abdomen: No acute abnormality. Gallstone is identified measuring 2.9 cm, image 175/5. Musculoskeletal: No chest wall abnormality. No acute or significant osseous findings. Review of the MIP images confirms the above findings. IMPRESSION: 1. No evidence for acute pulmonary embolus. 2. Mild interlobular septal thickening within the lower lung zones may reflect mild pulmonary edema. 3. Circumferential wall thickening involving the distal esophagus. Correlate for any clinical signs or symptoms of esophagitis. 4. Cholelithiasis. 5. Coronary artery calcifications. 6. Aortic Atherosclerosis (ICD10-I70.0) and Emphysema (ICD10-J43.9). Electronically Signed   By: Kimberley Penman M.D.   On: 10/26/2023 06:12   DG Chest 2 View Result Date: 10/25/2023 CLINICAL DATA:  CP EXAM: CHEST - 2 VIEW COMPARISON:   None available. FINDINGS: No focal airspace consolidation, pleural effusion, or pneumothorax. No cardiomegaly. No acute fracture or destructive lesion. Multilevel thoracic osteophytosis. IMPRESSION: No acute cardiopulmonary abnormality. Electronically Signed   By: Rance Burrows M.D.   On: 10/25/2023 17:56     Assessment and Plan:   Chest pain She is reporting nonexertional chest pain with a multitude of other symptoms while watching TV.  Symptoms sound atypical. EKG with no acute ST-T wave changes.  Troponins are negative.  Certainly has risk factors for CAD but has significant socioeconomic issues that should be addressed before evaluating too extensively due to concern of compliance. For now I think coronary CTA is reasonable. NPO midnight, adjust insulin  accordingly.  Echocardiogram pending  DKA Cachexia/homeless Prior substance abuse Nicotine  dependence 1 pack/day Per primary team. Check UDS.     Risk Assessment/Risk Scores:     For questions or updates, please contact [facility] Please consult [email or web address] for contact info under    Signed, Burnetta Cart, PA-C  10/26/2023 12:29 PM   I have personally seen and examined this patient. I agree with the assessment and plan as outlined above.  58 yo female with history of DM, etoh use, tobacco abuse admitted with chest pain and found to have DKA.  No chest pain today Labs reviewed by me. Troponin negative x 2 EKG without ischemic changes My exam: NAD  CV:RRR Lungs:clear bilaterally  Ext: no LE edema Plan: chest pain without evidence of ACS but cardiac risk factors including tobacco abuse and DM. Will plan coronary CTA tomorrow to exclude CAD  Antoinette Batman, MD, [facility] 10/26/2023 1:06 PM

## 2023-10-26 NOTE — Progress Notes (Signed)
 CSW added substance abuse resources to patient's AVS.  Edwin Dada, MSW, LCSW Transitions of Care  Clinical Social Worker II [phone]

## 2023-10-26 NOTE — Assessment & Plan Note (Addendum)
-   A1c > 15.5%, not unsurprising given result and poor dietary compliance - Noncompliant on home insulin  due to financial constraints; will need to find what will be affordable prior to discharge - She did not tolerate metformin  due to GI side effects, therefore will not be resumed - continue Semglee  and SSI

## 2023-10-26 NOTE — Progress Notes (Signed)
 Attempted to do admission at this time. Patient currently off unit at vascular.

## 2023-10-26 NOTE — Hospital Course (Signed)
 Nancy [name] is a 58 year old female with PMH DM II, tobacco use, alcohol use, depression, GERD who presented with complaints of chest pain ongoing for approximately 2 days.  She endorsed being off her medicines for approximately 1 year mostly due to financial constraints.  She states that she does not pay for the cigarettes or alcohol that she uses. On workup, she was found to be in DKA and started on insulin  drip.  She was admitted for further chest pain workup as well and cardiology was consulted.

## 2023-10-26 NOTE — Assessment & Plan Note (Signed)
-   Patient endorses having been on Lexapro  in the past and wishes to restart

## 2023-10-26 NOTE — Assessment & Plan Note (Signed)
 Continue Tylenol for pain control.

## 2023-10-26 NOTE — Assessment & Plan Note (Addendum)
-   UDS noted with cocaine too - atypical but does have risk factors; compliance also a concern - appreciate cardiology evaluation; planing for CTA tomorrow - echo: EF 55-60%, no RWMA, normal diastology  - trops neg as well as EKG - Coronary CT obtained by cardiology but unable to process CT FFR of the RCA lesion due to motion artifact.  Patient cleared for discharge home per cardiology with no further inpatient ischemic workup necessary at this time - She was continued on aspirin  and statin at discharge with strong recommendations for smoking and alcohol cessation

## 2023-10-26 NOTE — ED Notes (Signed)
 Pt reports consuming "one tall boy of beer" daily

## 2023-10-26 NOTE — Inpatient Diabetes Management (Signed)
 Inpatient Diabetes Program Recommendations  AACE/ADA: New Consensus Statement on Inpatient Glycemic Control (2015)  Target Ranges:  Prepandial:   less than 140 mg/dL      Peak postprandial:   less than 180 mg/dL (1-2 hours)      Critically ill patients:  140 - 180 mg/dL   Lab Results  Component Value Date   GLUCAP 163 (H) 10/26/2023   HGBA1C 14.6 07/08/2017    Review of Glycemic Control  Latest Reference Range & Units 10/26/23 00:59 10/26/23 02:07 10/26/23 03:13 10/26/23 04:35 10/26/23 05:59 10/26/23 08:18  Glucose-Capillary 70 - 99 mg/dL 161 (H) 096 (H) 045 (H) 202 (H) 183 (H) 163 (H)  (H): Data is abnormally high Diabetes history: Type 2 DM Outpatient Diabetes medications: none Current orders for Inpatient glycemic control: Semglee  10 units every day, Novolog  0-15 units TID & HS  Inpatient Diabetes Program Recommendations:    Spoke with patient regarding outpatient diabetes management. Patient has not been taking insulin  due to affordability.  Cannot afford 4$ per month for insulin . TOC consult placed. Per patient, she cannot go back to [facility] or Health dept. due to past due bill.  Reviewed patient's previous A1c of 14.6% and current one pending. Explained what a A1c is and what it measures. Also reviewed goal A1c with patient, importance of good glucose control @ home, and blood sugar goals. Discussed patho of DM, need for improved control, reason for weight loss, DKA, insulin  role, role of pancreas, survival skills, interventions, vascular changes and commorbidities.  Patient will need a meter at discharge. Interested in CGM. Looked in patient's phone and it did not have enough room for download due to storage issues. Patient aware.  Patient drinks sugary beverages and eats what is available. Encouraged overall mindfulness with CHO. Will place dietitian consult for additional nutrition needs.  Thanks, Marjo Sievert, MSN, RNC-OB Diabetes Coordinator [phone] (8a-5p)

## 2023-10-26 NOTE — H&P (Signed)
 History and Physical    Nancy [name] ZOX:[id number] DOB: 14-Jun-1966 DOA: 10/25/2023  Patient coming from: Home.  Chief Complaint: Chest pain.  HPI: Nancy [name] is a 58 y.o. female with history of diabetes mellitus type 2, anxiety depression and bilateral osteoarthritis of the hip uses crutches to walk around presents to the ER because of left-sided chest pain ongoing for last 2 days.  Patient states that her chest pain is like a pressure with no associated shortness of breath cough.  And even at rest.  She has not taken her medications for more than a year since patient was unable to afford it.  Admits to smoking cigarettes and drinking alcohol every day.  ED Course: In the ER patient's labs show blood glucose of 604 anion gap of 14 beta hydroxybutyrate acid of 3.8 urine shows ketones.  Troponins were negative EKG shows normal sinus rhythm chest x-ray unremarkable.  Patient was given fluids and IV insulin  and started on DKA protocol.  Review of Systems: As per HPI, rest all negative.   Past Medical History:  Diagnosis Date   Abnormal Pap smear    Bronchitis    Depression    Diabetes (HCC)    GERD (gastroesophageal reflux disease)    tums prn   Headache(784.0)    Polysubstance abuse (HCC)     Past Surgical History:  Procedure Laterality Date   CERVICAL CONIZATION W/BX  12/13/2011   Procedure: CONIZATION CERVIX WITH BIOPSY;  Surgeon: Ana Balling, MD;  Location: [facility];  Service: Gynecology;  Laterality: N/A;   DENTAL SURGERY       reports that she has been smoking cigarettes. She has a 20 pack-year smoking history. She has never used smokeless tobacco. She reports current alcohol use of about 7.0 standard drinks of alcohol per week. She reports that she does not currently use drugs after having used the following drugs: "Crack" cocaine and Marijuana.  Allergies  Allergen Reactions   No Known Allergies     Family History  Problem Relation Age of Onset   Cancer Mother         unsure    Prior to Admission medications   Not on File    Physical Exam: Constitutional: Poorly built and nourished. Vitals:   10/25/23 2253 10/26/23 0105 10/26/23 0105 10/26/23 0200  BP: 99/63 101/63  108/71  Pulse: 87 82  77  Resp: 20 (!) 22  16  Temp:   98 F (36.7 C)   TempSrc:   Oral   SpO2: 100% 100%  100%  Weight:      Height:       Eyes: Anicteric no pallor. ENMT: No discharge from the ears/nose or mouth. Neck: No mass felt.  No neck rigidity. Respiratory: No rhonchi or crepitations. Cardiovascular: S1-S2 heard. Abdomen: Soft nontender bowel sound present. Musculoskeletal: No edema. Skin: No rash. Neurologic: Alert awake oriented time place and person.  Moves all extremities. Psychiatric: Appears normal.  Normal affect.   Labs on Admission: I have personally reviewed following labs and imaging studies  CBC: Recent Labs  Lab 10/25/23 1738 10/25/23 1853  WBC 7.7  --   HGB 12.9 12.6  HCT 37.7 37.0  MCV 93.3  --   PLT 238  --    Basic Metabolic Panel: Recent Labs  Lab 10/25/23 1738 10/25/23 1853  NA 135 134*  K 4.2 4.3  CL 97*  --   CO2 24  --   GLUCOSE 604*  --  BUN 16  --   CREATININE 1.01*  --   CALCIUM  8.6*  --    GFR: Estimated Creatinine Clearance: 65 mL/min (A) (by C-G formula based on SCr of 1.01 mg/dL (H)). Liver Function Tests: No results for input(s): "AST", "ALT", "ALKPHOS", "BILITOT", "PROT", "ALBUMIN" in the last 168 hours. No results for input(s): "LIPASE", "AMYLASE" in the last 168 hours. No results for input(s): "AMMONIA" in the last 168 hours. Coagulation Profile: No results for input(s): "INR", "PROTIME" in the last 168 hours. Cardiac Enzymes: No results for input(s): "CKTOTAL", "CKMB", "CKMBINDEX", "TROPONINI" in the last 168 hours. BNP (last 3 results) No results for input(s): "PROBNP" in the last 8760 hours. HbA1C: No results for input(s): "HGBA1C" in the last 72 hours. CBG: Recent Labs  Lab 10/25/23 2103  10/25/23 2217 10/25/23 2327 10/26/23 0059 10/26/23 0207  GLUCAP 378* 295* 337* 302* 247*   Lipid Profile: No results for input(s): "CHOL", "HDL", "LDLCALC", "TRIG", "CHOLHDL", "LDLDIRECT" in the last 72 hours. Thyroid  Function Tests: Recent Labs    10/25/23 1735  TSH 1.671   Anemia Panel: No results for input(s): "VITAMINB12", "FOLATE", "FERRITIN", "TIBC", "IRON", "RETICCTPCT" in the last 72 hours. Urine analysis:    Component Value Date/Time   COLORURINE YELLOW 10/25/2023 1716   APPEARANCEUR CLEAR 10/25/2023 1716   LABSPEC 1.015 10/25/2023 1716   PHURINE 6.0 10/25/2023 1716   GLUCOSEU >=500 (A) 10/25/2023 1716   HGBUR NEGATIVE 10/25/2023 1716   BILIRUBINUR NEGATIVE 10/25/2023 1716   KETONESUR >80 (A) 10/25/2023 1716   PROTEINUR NEGATIVE 10/25/2023 1716   UROBILINOGEN 0.2 02/11/2012 0858   NITRITE NEGATIVE 10/25/2023 1716   LEUKOCYTESUR NEGATIVE 10/25/2023 1716   Sepsis Labs: @LABRCNTIP (procalcitonin:4,lacticidven:4) ) Recent Results (from the past 240 hours)  Resp panel by RT-PCR (RSV, Flu A&B, Covid) Anterior Nasal Swab     Status: None   Collection Time: 10/25/23  6:48 PM   Specimen: Anterior Nasal Swab  Result Value Ref Range Status   SARS Coronavirus 2 by RT PCR NEGATIVE NEGATIVE Final   Influenza A by PCR NEGATIVE NEGATIVE Final   Influenza B by PCR NEGATIVE NEGATIVE Final    Comment: (NOTE) The Xpert Xpress SARS-CoV-2/FLU/RSV plus assay is intended as an aid in the diagnosis of influenza from Nasopharyngeal swab specimens and should not be used as a sole basis for treatment. Nasal washings and aspirates are unacceptable for Xpert Xpress SARS-CoV-2/FLU/RSV testing.  Fact Sheet for Patients: BloggerCourse.com  Fact Sheet for Healthcare Providers: SeriousBroker.it  This test is not yet approved or cleared by the [place]  FDA and has been authorized for detection and/or diagnosis of SARS-CoV-2 by FDA  under an Emergency Use Authorization (EUA). This EUA will remain in effect (meaning this test can be used) for the duration of the COVID-19 declaration under Section 564(b)(1) of the Act, 21 U.S.C. section 360bbb-3(b)(1), unless the authorization is terminated or revoked.     Resp Syncytial Virus by PCR NEGATIVE NEGATIVE Final    Comment: (NOTE) Fact Sheet for Patients: BloggerCourse.com  Fact Sheet for Healthcare Providers: SeriousBroker.it  This test is not yet approved or cleared by the [place]  FDA and has been authorized for detection and/or diagnosis of SARS-CoV-2 by FDA under an Emergency Use Authorization (EUA). This EUA will remain in effect (meaning this test can be used) for the duration of the COVID-19 declaration under Section 564(b)(1) of the Act, 21 U.S.C. section 360bbb-3(b)(1), unless the authorization is terminated or revoked.  Performed at [facility], [address], [place], [place] [address]      Radiological Exams on Admission: DG Chest 2 View Result Date: 10/25/2023 CLINICAL DATA:  CP EXAM: CHEST - 2 VIEW COMPARISON:  None available. FINDINGS: No focal airspace consolidation, pleural effusion, or pneumothorax. No cardiomegaly. No acute fracture or destructive lesion. Multilevel thoracic osteophytosis. IMPRESSION: No acute cardiopulmonary abnormality. Electronically Signed   By: Rance Burrows M.D.   On: 10/25/2023 17:56    EKG: Independently reviewed.  Normal sinus rhythm.  Assessment/Plan Principal Problem:   DKA, type 2 (HCC) Active Problems:   Alcohol abuse   ARF (acute renal failure) (HCC)   DKA, type 1 (HCC)    Diabetic ketoacidosis likely from not being compliant with her medications for more than a year.  On insulin  infusion.  Transition to insulin  glargine subcutaneous once anion gap gets corrected.  Check hemoglobin A1c. Chest pain -    has atypical and typical features.  Will  check CT angiogram of the chest.  2D echo.  Aspirin.  Will consult cardiology. Acute renal failure with creatinine mildly increased from baseline.  I think will improve with hydration. Bilateral osteoarthritis of the hip.  Patient states she follows with Dr. Lucienne Ryder in orthopedics. History of anxiety and depression denies any suicidal thoughts. Cachexia will consult nutrition, Alcohol and tobacco abuse advised about quitting.  On CIWA protocol.  Since patient has DKA with chest pain will need close monitoring further workup and more than 2 midnight stay.   DVT prophylaxis: Lovenox. Code Status: Full code. Family Communication: Discussed with patient. Disposition Plan: Progressive care. Consults called: Cardiology. Admission status: Observation.

## 2023-10-26 NOTE — Assessment & Plan Note (Signed)
-

## 2023-10-26 NOTE — Assessment & Plan Note (Addendum)
-   no w/d; d/c CIWA

## 2023-10-26 NOTE — Progress Notes (Signed)
 Progress Note    Nancy [name]   ZOX:[id number]  DOB: Oct 05, 1965  DOA: 10/25/2023     0 PCP: Harry Lindau, FNP  Initial CC: CP  Hospital Course: Nancy [name] is a 58 year old female with PMH DM II, tobacco use, alcohol use, depression, GERD who presented with complaints of chest pain ongoing for approximately 2 days.  She endorsed being off her medicines for approximately 1 year mostly due to financial constraints.  She states that she does not pay for the cigarettes or alcohol that she uses. On workup, she was found to be in DKA and started on insulin  drip.  She was admitted for further chest pain workup as well and cardiology was consulted.  Interval History:  Resting comfortably in bed this morning.  Complaining of generalized arthritic pains.  Also asking to go back on Lexapro  which worked well for her in the past.  Denies any chest pain when seen this morning.  Assessment and Plan: * Chest pain - atypical but does have risk factors; compliance also a concern - appreciate cardiology evaluation; planing for CTA tomorrow - follow up echo - trops neg as well as EKG  DKA, type 2 (HCC) - Follow-up A1c - Noncompliant on home insulin  due to financial constraints; will need to find what will be affordable prior to discharge - She did not tolerate metformin  due to GI side effects, therefore will not be resumed - continue Semglee  and SSI  AKI (acute kidney injury) (HCC)-resolved as of 10/26/2023 - baseline creatinine ~ 0.7 - patient presents with increase in creat >0.3 mg/dL above baseline or creat increase >1.5x baseline presumed to have occurred within past 7 days PTA - creat 1.01 on admission - s/p IVF - back to baseline   Normocytic anemia - With underlying alcohol abuse, at risk for folate or B12 deficiency - Check both  Hypokalemia - Replete as needed  Osteoarthritis - Noted in her hips.  Follows outpatient with Dr. Lucienne Ryder, orthopedic surgery  Primary osteoarthritis  of left hip - Continue Tylenol  for pain control  Depression with anxiety - Patient endorses having been on Lexapro  in the past and wishes to restart  Alcohol abuse - Continue CIWA - Follow-up UDS   Old records reviewed in assessment of this patient  Antimicrobials:   DVT prophylaxis:  enoxaparin (LOVENOX) injection 40 mg Start: 10/26/23 1000   Code Status:   Code Status: Full Code  Mobility Assessment (Last 72 Hours)     Mobility Assessment   No documentation.           Barriers to discharge: None Disposition Plan: Home HH orders placed: N/A Status is: Observation  Objective: Blood pressure (!) 118/94, pulse 78, temperature 98.1 F (36.7 C), temperature source Oral, resp. rate 17, height 5\' 9"  (1.753 m), weight 74.8 kg, SpO2 98%.  Examination:  Physical Exam Constitutional:      Appearance: Normal appearance.  HENT:     Head: Normocephalic and atraumatic.     Mouth/Throat:     Mouth: Mucous membranes are moist.  Eyes:     Extraocular Movements: Extraocular movements intact.  Cardiovascular:     Rate and Rhythm: Normal rate and regular rhythm.  Pulmonary:     Effort: Pulmonary effort is normal. No respiratory distress.     Breath sounds: Normal breath sounds. No wheezing.  Abdominal:     General: Bowel sounds are normal. There is no distension.     Palpations: Abdomen is soft.  Tenderness: There is no abdominal tenderness.  Musculoskeletal:        General: Normal range of motion.     Cervical back: Normal range of motion and neck supple.  Skin:    General: Skin is warm and dry.  Neurological:     General: No focal deficit present.     Mental Status: She is alert.  Psychiatric:        Mood and Affect: Mood normal.      Consultants:  Cardiology  Procedures:    Data Reviewed: Results for orders placed or performed during the hospital encounter of 10/25/23 (from the past 24 hours)  CBG monitoring, ED     Status: Abnormal   Collection  Time: 10/25/23  5:10 PM  Result Value Ref Range   Glucose-Capillary 549 (HH) 70 - 99 mg/dL   Comment 1 Document in Chart   Urinalysis, Routine w reflex microscopic -Urine, Clean Catch     Status: Abnormal   Collection Time: 10/25/23  5:16 PM  Result Value Ref Range   Color, Urine YELLOW YELLOW   APPearance CLEAR CLEAR   Specific Gravity, Urine 1.015 1.005 - 1.030   pH 6.0 5.0 - 8.0   Glucose, UA >=500 (A) NEGATIVE mg/dL   Hgb urine dipstick NEGATIVE NEGATIVE   Bilirubin Urine NEGATIVE NEGATIVE   Ketones, ur >80 (A) NEGATIVE mg/dL   Protein, ur NEGATIVE NEGATIVE mg/dL   Nitrite NEGATIVE NEGATIVE   Leukocytes,Ua NEGATIVE NEGATIVE  Urinalysis, Microscopic (reflex)     Status: Abnormal   Collection Time: 10/25/23  5:16 PM  Result Value Ref Range   RBC / HPF 0-5 0 - 5 RBC/hpf   WBC, UA 6-10 0 - 5 WBC/hpf   Bacteria, UA RARE (A) NONE SEEN   Squamous Epithelial / HPF 0-5 0 - 5 /HPF   Budding Yeast PRESENT   Beta-hydroxybutyric acid     Status: Abnormal   Collection Time: 10/25/23  5:35 PM  Result Value Ref Range   Beta-Hydroxybutyric Acid 3.89 (H) 0.05 - 0.27 mmol/L  Brain natriuretic peptide     Status: None   Collection Time: 10/25/23  5:35 PM  Result Value Ref Range   B Natriuretic Peptide 27.0 0.0 - 100.0 pg/mL  TSH     Status: None   Collection Time: 10/25/23  5:35 PM  Result Value Ref Range   TSH 1.671 0.350 - 4.500 uIU/mL  Basic metabolic panel     Status: Abnormal   Collection Time: 10/25/23  5:38 PM  Result Value Ref Range   Sodium 135 135 - 145 mmol/L   Potassium 4.2 3.5 - 5.1 mmol/L   Chloride 97 (L) 98 - 111 mmol/L   CO2 24 22 - 32 mmol/L   Glucose, Bld 604 (HH) 70 - 99 mg/dL   BUN 16 6 - 20 mg/dL   Creatinine, Ser 5.62 (H) 0.44 - 1.00 mg/dL   Calcium  8.6 (L) 8.9 - 10.3 mg/dL   GFR, Estimated >13 >08 mL/min   Anion gap 14 5 - 15  CBC     Status: None   Collection Time: 10/25/23  5:38 PM  Result Value Ref Range   WBC 7.7 4.0 - 10.5 K/uL   RBC 4.04 3.87 -  5.11 MIL/uL   Hemoglobin 12.9 12.0 - 15.0 g/dL   HCT 65.7 84.6 - 96.2 %   MCV 93.3 80.0 - 100.0 fL   MCH 31.9 26.0 - 34.0 pg   MCHC 34.2 30.0 - 36.0 g/dL  RDW 11.8 11.5 - 15.5 %   Platelets 238 150 - 400 K/uL   nRBC 0.0 0.0 - 0.2 %  Troponin I (High Sensitivity)     Status: None   Collection Time: 10/25/23  5:38 PM  Result Value Ref Range   Troponin I (High Sensitivity) 6 <18 ng/L  hCG, serum, qualitative     Status: None   Collection Time: 10/25/23  5:38 PM  Result Value Ref Range   Preg, Serum NEGATIVE NEGATIVE  Resp panel by RT-PCR (RSV, Flu A&B, Covid) Anterior Nasal Swab     Status: None   Collection Time: 10/25/23  6:48 PM   Specimen: Anterior Nasal Swab  Result Value Ref Range   SARS Coronavirus 2 by RT PCR NEGATIVE NEGATIVE   Influenza A by PCR NEGATIVE NEGATIVE   Influenza B by PCR NEGATIVE NEGATIVE   Resp Syncytial Virus by PCR NEGATIVE NEGATIVE  I-Stat venous blood gas, ([facility], [facility], [facility])     Status: Abnormal   Collection Time: 10/25/23  6:53 PM  Result Value Ref Range   pH, Ven 7.359 7.25 - 7.43   pCO2, Ven 47.5 44 - 60 mmHg   pO2, Ven 139 (H) 32 - 45 mmHg   Bicarbonate 26.8 20.0 - 28.0 mmol/L   TCO2 28 22 - 32 mmol/L   O2 Saturation 99 %   Acid-Base Excess 1.0 0.0 - 2.0 mmol/L   Sodium 134 (L) 135 - 145 mmol/L   Potassium 4.3 3.5 - 5.1 mmol/L   Calcium , Ion 1.13 (L) 1.15 - 1.40 mmol/L   HCT 37.0 36.0 - 46.0 %   Hemoglobin 12.6 12.0 - 15.0 g/dL   Sample type VENOUS   CBG monitoring, ED     Status: Abnormal   Collection Time: 10/25/23  7:36 PM  Result Value Ref Range   Glucose-Capillary 447 (H) 70 - 99 mg/dL  Troponin I (High Sensitivity)     Status: None   Collection Time: 10/25/23  8:00 PM  Result Value Ref Range   Troponin I (High Sensitivity) 6 <18 ng/L  CBG monitoring, ED     Status: Abnormal   Collection Time: 10/25/23  8:26 PM  Result Value Ref Range   Glucose-Capillary 440 (H) 70 - 99 mg/dL  CBG monitoring, ED     Status: Abnormal   Collection  Time: 10/25/23  9:03 PM  Result Value Ref Range   Glucose-Capillary 378 (H) 70 - 99 mg/dL  CBG monitoring, ED     Status: Abnormal   Collection Time: 10/25/23 10:17 PM  Result Value Ref Range   Glucose-Capillary 295 (H) 70 - 99 mg/dL  CBG monitoring, ED     Status: Abnormal   Collection Time: 10/25/23 11:27 PM  Result Value Ref Range   Glucose-Capillary 337 (H) 70 - 99 mg/dL  CBG monitoring, ED     Status: Abnormal   Collection Time: 10/26/23 12:59 AM  Result Value Ref Range   Glucose-Capillary 302 (H) 70 - 99 mg/dL  CBC     Status: Abnormal   Collection Time: 10/26/23  1:45 AM  Result Value Ref Range   WBC 8.3 4.0 - 10.5 K/uL   RBC 3.70 (L) 3.87 - 5.11 MIL/uL   Hemoglobin 11.7 (L) 12.0 - 15.0 g/dL   HCT 82.9 (L) 56.2 - 13.0 %   MCV 92.7 80.0 - 100.0 fL   MCH 31.6 26.0 - 34.0 pg   MCHC 34.1 30.0 - 36.0 g/dL   RDW 86.5 78.4 -  15.5 %   Platelets 204 150 - 400 K/uL   nRBC 0.0 0.0 - 0.2 %  CBG monitoring, ED     Status: Abnormal   Collection Time: 10/26/23  2:07 AM  Result Value Ref Range   Glucose-Capillary 247 (H) 70 - 99 mg/dL  Basic metabolic panel with GFR     Status: Abnormal   Collection Time: 10/26/23  2:20 AM  Result Value Ref Range   Sodium 136 135 - 145 mmol/L   Potassium 3.1 (L) 3.5 - 5.1 mmol/L   Chloride 100 98 - 111 mmol/L   CO2 25 22 - 32 mmol/L   Glucose, Bld 230 (H) 70 - 99 mg/dL   BUN 16 6 - 20 mg/dL   Creatinine, Ser 0.98 0.44 - 1.00 mg/dL   Calcium  8.9 8.9 - 10.3 mg/dL   GFR, Estimated >11 >91 mL/min   Anion gap 11 5 - 15  HIV Antibody (routine testing w rflx)     Status: None   Collection Time: 10/26/23  2:20 AM  Result Value Ref Range   HIV Screen 4th Generation wRfx Non Reactive Non Reactive  CBG monitoring, ED     Status: Abnormal   Collection Time: 10/26/23  3:13 AM  Result Value Ref Range   Glucose-Capillary 212 (H) 70 - 99 mg/dL  Beta-hydroxybutyric acid     Status: None   Collection Time: 10/26/23  3:42 AM  Result Value Ref Range    Beta-Hydroxybutyric Acid 0.09 0.05 - 0.27 mmol/L  CBG monitoring, ED     Status: Abnormal   Collection Time: 10/26/23  4:35 AM  Result Value Ref Range   Glucose-Capillary 202 (H) 70 - 99 mg/dL  CBG monitoring, ED     Status: Abnormal   Collection Time: 10/26/23  5:59 AM  Result Value Ref Range   Glucose-Capillary 183 (H) 70 - 99 mg/dL   Comment 1 Notify RN    Comment 2 Document in Chart   Basic metabolic panel     Status: Abnormal   Collection Time: 10/26/23  6:14 AM  Result Value Ref Range   Sodium 135 135 - 145 mmol/L   Potassium 3.7 3.5 - 5.1 mmol/L   Chloride 100 98 - 111 mmol/L   CO2 25 22 - 32 mmol/L   Glucose, Bld 172 (H) 70 - 99 mg/dL   BUN 17 6 - 20 mg/dL   Creatinine, Ser 4.78 0.44 - 1.00 mg/dL   Calcium  8.5 (L) 8.9 - 10.3 mg/dL   GFR, Estimated >29 >56 mL/min   Anion gap 10 5 - 15  Beta-hydroxybutyric acid     Status: None   Collection Time: 10/26/23  6:14 AM  Result Value Ref Range   Beta-Hydroxybutyric Acid 0.06 0.05 - 0.27 mmol/L  CBG monitoring, ED     Status: Abnormal   Collection Time: 10/26/23  8:18 AM  Result Value Ref Range   Glucose-Capillary 163 (H) 70 - 99 mg/dL    I have reviewed pertinent nursing notes, vitals, labs, and images as necessary. I have ordered labwork to follow up on as indicated.  I have reviewed the last notes from staff over past 24 hours. I have discussed patient's care plan and test results with nursing staff, CM/SW, and other staff as appropriate.  Time spent: Greater than 50% of the 55 minute visit was spent in counseling/coordination of care for the patient as laid out in the A&P.   LOS: 0 days   Faith Homes, MD [facility] 10/26/2023, 1:25 PM

## 2023-10-26 NOTE — Assessment & Plan Note (Signed)
-   baseline creatinine ~ 0.7 - patient presents with increase in creat >0.3 mg/dL above baseline or creat increase >1.5x baseline presumed to have occurred within past 7 days PTA - creat 1.01 on admission - s/p IVF - back to baseline

## 2023-10-27 ENCOUNTER — Other Ambulatory Visit (HOSPITAL_COMMUNITY): Payer: Self-pay

## 2023-10-27 ENCOUNTER — Other Ambulatory Visit: Payer: Self-pay

## 2023-10-27 DIAGNOSIS — N179 Acute kidney failure, unspecified: Secondary | ICD-10-CM | POA: Diagnosis not present

## 2023-10-27 DIAGNOSIS — E43 Unspecified severe protein-calorie malnutrition: Secondary | ICD-10-CM | POA: Insufficient documentation

## 2023-10-27 DIAGNOSIS — E119 Type 2 diabetes mellitus without complications: Secondary | ICD-10-CM

## 2023-10-27 DIAGNOSIS — E1110 Type 2 diabetes mellitus with ketoacidosis without coma: Secondary | ICD-10-CM | POA: Diagnosis not present

## 2023-10-27 DIAGNOSIS — R079 Chest pain, unspecified: Secondary | ICD-10-CM | POA: Diagnosis not present

## 2023-10-27 LAB — HEMOGLOBIN A1C
Hgb A1c MFr Bld: >15.5 % — ABNORMAL HIGH (ref 4.8–5.6)
Mean Plasma Glucose: >398 mg/dL

## 2023-10-27 LAB — RAPID URINE DRUG SCREEN, HOSP PERFORMED
Amphetamines: NOT DETECTED
Barbiturates: NOT DETECTED
Benzodiazepines: NOT DETECTED
Cocaine: POSITIVE — AB
Opiates: NOT DETECTED
Tetrahydrocannabinol: NOT DETECTED

## 2023-10-27 LAB — GLUCOSE, CAPILLARY
Glucose-Capillary: 375 mg/dL — ABNORMAL HIGH (ref 70–99)
Glucose-Capillary: 378 mg/dL — ABNORMAL HIGH (ref 70–99)
Glucose-Capillary: 379 mg/dL — ABNORMAL HIGH (ref 70–99)
Glucose-Capillary: 272 mg/dL — ABNORMAL HIGH (ref 70–99)

## 2023-10-27 LAB — GLUCOSE, RANDOM: Glucose, Bld: 476 mg/dL — ABNORMAL HIGH (ref 70–99)

## 2023-10-27 MED ORDER — ASPIRIN 81 MG PO TBEC
81.0000 mg | DELAYED_RELEASE_TABLET | Freq: Every day | ORAL | Status: DC
Start: 2023-10-28 — End: 2024-02-16

## 2023-10-27 MED ORDER — ESCITALOPRAM OXALATE 10 MG PO TABS
10.0000 mg | ORAL_TABLET | Freq: Every day | ORAL | 2 refills | Status: DC
  Filled 2023-10-27 – 2023-10-28 (×2): qty 90, 90d supply, fill #0

## 2023-10-27 MED ORDER — LANCET DEVICE MISC
1.0000 | Freq: Three times a day (TID) | 0 refills | Status: DC
Start: 2023-10-27 — End: 2024-01-24
  Filled 2023-10-27: qty 1, 30d supply, fill #0
  Filled 2023-10-28: qty 1, 90d supply, fill #0

## 2023-10-27 MED ORDER — ATORVASTATIN CALCIUM 40 MG PO TABS
40.0000 mg | ORAL_TABLET | Freq: Every day | ORAL | Status: DC
  Administered 2023-10-27 – 2023-10-28 (×2): 40 mg via ORAL
  Filled 2023-10-27 (×2): qty 1

## 2023-10-27 MED ORDER — GLUCERNA SHAKE PO LIQD
237.0000 mL | Freq: Three times a day (TID) | ORAL | Status: DC
  Administered 2023-10-27 – 2023-10-28 (×2): 237 mL via ORAL

## 2023-10-27 MED ORDER — ASPIRIN 81 MG PO TBEC
81.0000 mg | DELAYED_RELEASE_TABLET | Freq: Every day | ORAL | Status: DC
  Administered 2023-10-27 – 2023-10-28 (×2): 81 mg via ORAL
  Filled 2023-10-27 (×2): qty 1

## 2023-10-27 MED ORDER — ALBUTEROL SULFATE HFA 108 (90 BASE) MCG/ACT IN AERS
1.0000 | INHALATION_SPRAY | RESPIRATORY_TRACT | 2 refills | Status: AC | PRN
Start: 2023-10-27 — End: ?
  Filled 2023-10-27: qty 18, 17d supply, fill #0
  Filled 2023-10-28: qty 18, 30d supply, fill #0

## 2023-10-27 MED ORDER — ATORVASTATIN CALCIUM 40 MG PO TABS
40.0000 mg | ORAL_TABLET | Freq: Every day | ORAL | 3 refills | Status: AC
  Filled 2023-10-27 – 2023-10-28 (×2): qty 90, 90d supply, fill #0

## 2023-10-27 MED ORDER — INSULIN GLARGINE-YFGN 100 UNIT/ML ~~LOC~~ SOLN
18.0000 [IU] | Freq: Every day | SUBCUTANEOUS | Status: DC
  Administered 2023-10-27: 18 [IU] via SUBCUTANEOUS
  Filled 2023-10-27 (×2): qty 0.18

## 2023-10-27 MED ORDER — NICOTINE 21 MG/24HR TD PT24
21.0000 mg | MEDICATED_PATCH | Freq: Every day | TRANSDERMAL | Status: DC
  Administered 2023-10-27 – 2023-10-28 (×2): 21 mg via TRANSDERMAL
  Filled 2023-10-27 (×2): qty 1

## 2023-10-27 MED ORDER — LANCETS MISC
1.0000 | Freq: Three times a day (TID) | 3 refills | Status: DC
Start: 2023-10-27 — End: 2024-01-24
  Filled 2023-10-27: qty 200, 50d supply, fill #0
  Filled 2023-10-28: qty 200, 30d supply, fill #0

## 2023-10-27 MED ORDER — BLOOD GLUCOSE TEST VI STRP
1.0000 | ORAL_STRIP | Freq: Three times a day (TID) | 3 refills | Status: DC
  Filled 2023-10-27 – 2023-10-28 (×2): qty 200, 50d supply, fill #0

## 2023-10-27 MED ORDER — INSULIN GLARGINE 100 UNIT/ML SOLOSTAR PEN
20.0000 [IU] | PEN_INJECTOR | Freq: Every day | SUBCUTANEOUS | 3 refills | Status: DC
  Filled 2023-10-27 – 2023-10-28 (×2): qty 15, 75d supply, fill #0

## 2023-10-27 MED ORDER — INSULIN ASPART 100 UNIT/ML FLEXPEN
0.0000 [IU] | PEN_INJECTOR | Freq: Three times a day (TID) | SUBCUTANEOUS | 0 refills | Status: DC
Start: 2023-10-27 — End: 2024-01-04
  Filled 2023-10-27: qty 15, 63d supply, fill #0
  Filled 2023-10-28: qty 15, 30d supply, fill #0

## 2023-10-27 MED ORDER — BLOOD GLUCOSE MONITOR SYSTEM W/DEVICE KIT
1.0000 | PACK | Freq: Three times a day (TID) | 0 refills | Status: DC
  Filled 2023-10-27: qty 1, 30d supply, fill #0
  Filled 2023-10-28: qty 1, 90d supply, fill #0

## 2023-10-27 MED ORDER — PEN NEEDLES 31G X 5 MM MISC
1.0000 | Freq: Three times a day (TID) | 3 refills | Status: AC
Start: 2023-10-27 — End: ?
  Filled 2023-10-27 – 2023-12-08 (×3): qty 100, 25d supply, fill #0

## 2023-10-27 NOTE — Progress Notes (Signed)
 Made MD aware pt CBG is 402. States to cover her with the upper sliding scale.

## 2023-10-27 NOTE — Progress Notes (Signed)
 Initial Nutrition Assessment  DOCUMENTATION CODES:   Severe malnutrition in context of social or environmental circumstances  INTERVENTION:  Continue folic acid, thiamine , and Multivitamin w/ minerals daily Glucerna Shake po TID, each supplement provides 220 kcal and 10 grams of protein Diet education added to AVS Recommend obtaining new weight  NUTRITION DIAGNOSIS:   Severe Malnutrition related to social / environmental circumstances as evidenced by severe fat depletion, severe muscle depletion.  GOAL:   Patient will meet greater than or equal to 90% of their needs  MONITOR:   PO intake, Weight trends, I & O's, Labs, Supplement acceptance  REASON FOR ASSESSMENT:   Consult Assessment of nutrition requirement/status, Diet education  ASSESSMENT:   58 y.o. female presented to the ED with L sided chest pain. PMH includes T2DM, anxiety, depression, GERD, polysubstance abuse, EtOH abuse, and osteoarthritis. Pt admitted with DKA.   Pt laying in bed at time of RD visit. Reports that her appetite has improved since being admitted. Pt shares that she eats scrambled eggs for breakfast and then either a sandwich or hot dog later on; sometimes eats rice or mac & cheese. She will snack occasionally if she has any. Shares that she is limited on money for food and transportation to the grocery store. RD reviewed methods to help with DM control with diet. Used examples with foods pt currently eats.   Meal Intake 5/7: 100% x 2 meals  Pt reports a UBW over 200#, 1-2 years ago and endorses a significant weight loss since then. Pt believes that she has lost ~130#. Current weight within chart is ~165# and RD suspects inaccuracy; asked RN to obtain new weight.  Discussed the importance of glycemic control and uncontrolled DM can cause weight loss in addition to poor wound healing.   RD discussed utilizing oral nutrition supplements while in hospital, pt agreeable and enjoys Glucerna. RD provided pt  with coupons for after discharge.    Medications reviewed and include: Folic acid, NovoLog  SSI, Semglee , MVI, Thiamine   Labs reviewed: Hgb A1c >15.5  CBG: 272-572 x 24 hrs   NUTRITION - FOCUSED PHYSICAL EXAM:  Flowsheet Row Most Recent Value  Orbital Region Severe depletion  Upper Arm Region Severe depletion  Thoracic and Lumbar Region Severe depletion  Buccal Region Severe depletion  Temple Region Severe depletion  Clavicle Bone Region Severe depletion  Clavicle and Acromion Bone Region Severe depletion  Scapular Bone Region Severe depletion  Dorsal Hand Moderate depletion  Patellar Region Severe depletion  Anterior Thigh Region Severe depletion  Posterior Calf Region Severe depletion  Edema (RD Assessment) None  Hair Reviewed  Eyes Reviewed  Mouth Reviewed  Skin Reviewed  Nails Reviewed   Diet Order:   Diet Order             Diet Carb Modified Fluid consistency: Thin; Room service appropriate? Yes  Diet effective now                  EDUCATION NEEDS: Education needs have been addressed  Skin:  Skin Assessment: Reviewed RN Assessment  Last BM:  5/6  Height:  Ht Readings from Last 1 Encounters:  10/25/23 5\' 9"  (1.753 m)   Weight:  Wt Readings from Last 1 Encounters:  10/25/23 74.8 kg   Ideal Body Weight:  65.9 kg  BMI:  Body mass index is 24.37 kg/m.  Estimated Nutritional Needs: Kcal:  1650-1850 Protein:  80-100 grams Fluid:  >/= 1.6 L   Doneta Furbish RD, LDN Clinical Dietitian

## 2023-10-27 NOTE — Progress Notes (Signed)
 Transition of Care [facility]) - Inpatient Brief Assessment   Patient Details  Name: Nancy [name] MRN: [id number] Date of Birth: [birth year]  [facility] CM/SW Contact:    Dane Dung, RN Phone Number: 10/27/2023, 11:07 AM   Clinical Narrative: CM met with the patient at the bedside to discuss TOC needs. The patient admitted to the hospital for CP and is waiting radiology test results prior to discharge.  Patient was seen by Cardiology and is pending results.  Patient plans to discharge home to current address.  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  The patient has seen a lawyer in the past to start disability process but has not followed up.  Patient plans to reach back out to a lawyer to assist with disability process.  Patient was provided with resources for social services as well.  Patient has Medicaid but does not have disability at this time.  Smoking and substance abuse resources were included in the patient's AVS.  Patient states that her crutches at home are old and she needs a new set.  Order placed to be co-signed by MD.  Bedside nursing was instructed to call the ortho tech to order and deliver to the bedside.  No other TOC needs.  Taxi voucher will be given to the charge RN if patient is able to discharge today - pending CT results.   Transition of Care Asessment: Insurance and Status: (P) Insurance coverage has been reviewed Patient has primary care physician: (P) Yes Home environment has been reviewed: (P) from home - Prior level of function:: (P) crutches Prior/Current Home Services: (P) No current home services Social Drivers of Health Review: (P) SDOH reviewed interventions complete Readmission risk has been reviewed: (P) Yes Transition of care needs: (P) transition of care needs identified, TOC will  continue to follow

## 2023-10-27 NOTE — Progress Notes (Signed)
 Rounding Note    Patient Name: Nancy [name] Date of Encounter: 10/27/2023  [facility] Cardiologist: Ardeth Krabbe  Subjective   No chest pain. Only c/o leg pain  Inpatient Medications    Scheduled Meds:  enoxaparin (LOVENOX) injection  40 mg Subcutaneous Daily   escitalopram   10 mg Oral Daily   folic acid  1 mg Oral Daily   insulin  aspart  0-15 Units Subcutaneous TID WC   insulin  aspart  0-5 Units Subcutaneous QHS   insulin  glargine-yfgn  10 Units Subcutaneous Daily   multivitamin with minerals  1 tablet Oral Daily   thiamine   100 mg Oral Daily   Or   thiamine   100 mg Intravenous Daily   Continuous Infusions:  PRN Meds: acetaminophen , albuterol , cyclobenzaprine , dextrose , iohexol, LORazepam  **OR** LORazepam    Vital Signs    Vitals:   10/26/23 1625 10/26/23 1925 10/26/23 2348 10/27/23 0423  BP: 118/74 103/73 102/65 113/68  Pulse: 74 72 73 69  Resp: 18 16 16 16   Temp: 97.9 F (36.6 C) 98.3 F (36.8 C) 98.1 F (36.7 C) (!) 97.5 F (36.4 C)  TempSrc: Oral Oral Oral Oral  SpO2: 100% 94% 96% 95%  Weight:      Height:        Intake/Output Summary (Last 24 hours) at 10/27/2023 0716 Last data filed at 10/27/2023 0459 Gross per 24 hour  Intake 540 ml  Output 725 ml  Net -185 ml      10/25/2023    5:03 PM 02/19/2019    9:41 AM 02/08/2019    7:02 PM  Last 3 Weights  Weight (lbs) 165 lb 165 lb 165 lb  Weight (kg) 74.844 kg 74.844 kg 74.844 kg      Telemetry    Sinus - Personally Reviewed  ECG    No am ekg - Personally Reviewed  Physical Exam   GEN: No acute distress.   Neck: No JVD Cardiac: RRR, no murmurs, rubs, or gallops.  Respiratory: Clear to auscultation bilaterally. GI: Soft, nontender, non-distended  MS: No edema; No deformity. Neuro:  Nonfocal  Psych: Normal affect   Labs    High Sensitivity Troponin:   Recent Labs  Lab 10/25/23 1738 10/25/23 2000  TROPONINIHS 6 6     Chemistry Recent Labs  Lab 10/26/23 0220  10/26/23 0614 10/26/23 2057 10/26/23 2234  NA 136 135  --  132*  K 3.1* 3.7  --  3.7  CL 100 100  --  95*  CO2 25 25  --  28  GLUCOSE 230* 172* 527* 493*  BUN 16 17  --  12  CREATININE 0.50 0.47  --  0.69  CALCIUM  8.9 8.5*  --  8.8*  GFRNONAA >60 >60  --  >60  ANIONGAP 11 10  --  9    Lipids No results for input(s): "CHOL", "TRIG", "HDL", "LABVLDL", "LDLCALC", "CHOLHDL" in the last 168 hours.  Hematology Recent Labs  Lab 10/25/23 1738 10/25/23 1853 10/26/23 0145  WBC 7.7  --  8.3  RBC 4.04  --  3.70*  HGB 12.9 12.6 11.7*  HCT 37.7 37.0 34.3*  MCV 93.3  --  92.7  MCH 31.9  --  31.6  MCHC 34.2  --  34.1  RDW 11.8  --  11.8  PLT 238  --  204   Thyroid   Recent Labs  Lab 10/25/23 1735  TSH 1.671    BNP Recent Labs  Lab 10/25/23 1735  BNP 27.0  DDimer No results for input(s): "DDIMER" in the last 168 hours.   Radiology    CT CORONARY MORPH W/CTA COR W/SCORE W/CA W/CM &/OR WO/CM Addendum Date: 10/26/2023 ADDENDUM REPORT: 10/26/2023 21:36 CLINICAL DATA:  This is a 58 year old female with anginal symptoms EXAM: Cardiac/Coronary  CTA TECHNIQUE: The patient was scanned on a [employer]. FINDINGS: A 100 kV prospective scan was triggered in the descending thoracic aorta at 111 HU's. Axial non-contrast 3 mm slices were carried out through the heart. The data set was analyzed on a dedicated work station and scored using the Agatson method. Gantry rotation speed was 250 msecs and collimation was .6 mm. No beta blockade and 0.8 mg of sl NTG was given. The 3D data set was reconstructed in 5% intervals of the 67-82 % of the R-R cycle. Diastolic phases were analyzed on a dedicated work station using MPR, MIP and VRT modes. The patient received 80 cc of contrast. Image Quality: Fair with misregistration artifact. Aorta: Normal 28.8 mm size.  No calcifications.  No dissection. Aortic Valve:  Trileaflet.  No calcifications. Coronary Arteries:  Normal coronary origin.  Right  Dominance. RCA is a large dominant artery that gives rise to PDA and PLA. The proximal RCA with no plaques. The proximal to mid RCA has multiple plaques: The first plaque is mild focal calcification. The right beneath it lies two separate moderate (50 to 69%) calcified plaques. Stairstep artifact noted in the mid to distal RCA. In the distal RCA there is no apparent plaque. The PLA and PDA does not have any plaques. Left main is a large artery that gives rise to LAD and LCX arteries. LAD is a large vessel. The proximal LAD with mild focal calcified plaque. At the proximal to mid LAD there is a minimal (<24%) focal calcified plaque. The mid to distal LAD with no plaques. The D1 vessel is a medium size vessel with minimal focal mixed plaque in the proximal portion of the vessel. D2 is a small caliber vessel minimal focal calcified plaque in the midportion of the vessel. LCX is a non-dominant artery that gives rise to one large OM1 branch. The left circumflex artery with a mild focal calcification in the proximal portion of the vessel. In the proximal to mid vessel there are diffuse minimal calcifications. No plaques seen in the distal portion of the left circumflex. The OM1 vessel is a medium size vessel there are diffuse minimal calcification in the proximal to midportion of the vessel. Coronary Calcium  Score: Left main: 0 Left anterior descending artery: 86.9 Left circumflex artery: 72.7 Right coronary artery: 320 Total: 488 Percentile: 99 Other findings: Normal pulmonary vein drainage into the left atrium. Normal left atrial appendage without a thrombus. Normal size of the pulmonary artery. IMPRESSION: 1. Coronary calcium  score of 488. This was 51 percentile for age and sex matched control. 2. Normal coronary origin with right dominance. 3. CAD-RADS 3. Moderate stenosis. Consider symptom-guided anti-ischemic pharmacotherapy as well as risk factor modification per guideline directed care. Additional analysis with CT  FFR will be submitted The noncardiac portion of this study will be interpreted in separate report by the radiologist. Electronically Signed   By: Kardie  Tobb D.O.   On: 10/26/2023 21:36   Result Date: 10/26/2023 EXAM: OVER-READ INTERPRETATION  CT CHEST The following report is a limited chest CT over-read performed by radiologist Dr. Tyron Gallon of [facility] on 10/26/2023. This over-read does not include interpretation of cardiac or coronary anatomy  or pathology. The coronary CTA interpretation by the cardiologist is attached. COMPARISON:  None Available. FINDINGS: There is a small hiatal hernia. Otherwise, visualized upper abdomen, lower lungs and lower mediastinum are within normal limits. No significant osseous abnormality. IMPRESSION: Small hiatal hernia. Electronically Signed: By: Tyron Gallon M.D. On: 10/26/2023 15:53   ECHOCARDIOGRAM COMPLETE Result Date: 10/26/2023    ECHOCARDIOGRAM REPORT   Patient Name:   HALLEE MOTA Date of Exam: 10/26/2023 Medical Rec #:  [id number]       Height:       69.0 in Accession #:    [id number]      Weight:       165.0 lb Date of Birth:  [birth year]        BSA:          1.904 m Patient Age:    57 years        BP:           107/72 mmHg Patient Gender: F               HR:           72 bpm. Exam Location:  Inpatient Procedure: 2D Echo, Color Doppler and Cardiac Doppler (Both Spectral and Color            Flow Doppler were utilized during procedure). Indications:    R07.9* Chest pain, unspecified  History:        Patient has no prior history of Echocardiogram examinations.                 Risk Factors:Diabetes, Dyslipidemia and Hypertension.  Sonographer:    Sherline Distel Senior RDCS Referring Phys: [id number] ARSHAD N KAKRAKANDY IMPRESSIONS  1. Left ventricular ejection fraction, by estimation, is 55 to 60%. The left ventricle has normal function. The left ventricle has no regional wall motion abnormalities. Left ventricular diastolic parameters were normal.  2. Right  ventricular systolic function is normal. The right ventricular size is normal.  3. The mitral valve is normal in structure. No evidence of mitral valve regurgitation. No evidence of mitral stenosis.  4. The aortic valve is normal in structure. Aortic valve regurgitation is not visualized. No aortic stenosis is present.  5. The inferior vena cava is normal in size with greater than 50% respiratory variability, suggesting right atrial pressure of 3 mmHg. FINDINGS  Left Ventricle: Left ventricular ejection fraction, by estimation, is 55 to 60%. The left ventricle has normal function. The left ventricle has no regional wall motion abnormalities. The left ventricular internal cavity size was normal in size. There is  no left ventricular hypertrophy. Left ventricular diastolic parameters were normal. Right Ventricle: The right ventricular size is normal. No increase in right ventricular wall thickness. Right ventricular systolic function is normal. Left Atrium: Left atrial size was normal in size. Right Atrium: Right atrial size was normal in size. Pericardium: There is no evidence of pericardial effusion. Mitral Valve: The mitral valve is normal in structure. No evidence of mitral valve regurgitation. No evidence of mitral valve stenosis. Tricuspid Valve: The tricuspid valve is normal in structure. Tricuspid valve regurgitation is not demonstrated. No evidence of tricuspid stenosis. Aortic Valve: The aortic valve is normal in structure. Aortic valve regurgitation is not visualized. No aortic stenosis is present. Pulmonic Valve: The pulmonic valve was normal in structure. Pulmonic valve regurgitation is not visualized. No evidence of pulmonic stenosis. Aorta: The aortic root is normal in size and structure. Venous: The inferior vena cava is  normal in size with greater than 50% respiratory variability, suggesting right atrial pressure of 3 mmHg. IAS/Shunts: No atrial level shunt detected by color flow Doppler.  LEFT  VENTRICLE PLAX 2D LVIDd:         3.60 cm      Diastology LVIDs:         3.10 cm      LV e' medial:    6.64 cm/s LV PW:         0.80 cm      LV E/e' medial:  9.7 LV IVS:        0.75 cm      LV e' lateral:   10.20 cm/s LVOT diam:     2.30 cm      LV E/e' lateral: 6.3 LV SV:         71 LV SV Index:   37 LVOT Area:     4.15 cm  LV Volumes (MOD) LV vol d, MOD A2C: 101.0 ml LV vol d, MOD A4C: 89.2 ml LV vol s, MOD A2C: 46.3 ml LV vol s, MOD A4C: 29.4 ml LV SV MOD A2C:     54.7 ml LV SV MOD A4C:     89.2 ml LV SV MOD BP:      56.8 ml RIGHT VENTRICLE RV S prime:     9.90 cm/s TAPSE (M-mode): 2.1 cm LEFT ATRIUM           Index       RIGHT ATRIUM           Index LA Vol (A4C): 18.8 ml 9.87 ml/m  RA Area:     10.70 cm                                   RA Volume:   22.90 ml  12.03 ml/m  AORTIC VALVE LVOT Vmax:   75.10 cm/s LVOT Vmean:  55.300 cm/s LVOT VTI:    0.171 m  AORTA Ao Root diam: 3.60 cm MITRAL VALVE               TRICUSPID VALVE MV Area (PHT): 3.17 cm    TR Peak grad:   2.1 mmHg MV Decel Time: 239 msec    TR Vmax:        71.90 cm/s MV E velocity: 64.70 cm/s MV A velocity: 54.80 cm/s  SHUNTS MV E/A ratio:  1.18        Systemic VTI:  0.17 m                            Systemic Diam: 2.30 cm Dorothye Gathers MD Electronically signed by Dorothye Gathers MD Signature Date/Time: 10/26/2023/5:12:03 PM    Final    CT Angio Chest Pulmonary Embolism (PE) W or WO Contrast Result Date: 10/26/2023 CLINICAL DATA:  Evaluate for acute pulmonary embolism. High probability. Chest pain. EXAM: CT ANGIOGRAPHY CHEST WITH CONTRAST TECHNIQUE: Multidetector CT imaging of the chest was performed using the standard protocol during bolus administration of intravenous contrast. Multiplanar CT image reconstructions and MIPs were obtained to evaluate the vascular anatomy. RADIATION DOSE REDUCTION: This exam was performed according to the departmental dose-optimization program which includes automated exposure control, adjustment of the mA and/or kV  according to patient size and/or use of iterative reconstruction technique. CONTRAST:  75mL OMNIPAQUE IOHEXOL 350 MG/ML SOLN COMPARISON:  None Available. FINDINGS:  Cardiovascular: Satisfactory opacification of the pulmonary arteries to the segmental level. No evidence of pulmonary embolism. Normal heart size. No pericardial effusion. Aortic atherosclerosis. Coronary artery calcifications. Mediastinum/Nodes: Thyroid  gland and trachea appear normal. Circumferential wall thickening involving the distal esophagus noted. No mediastinal or hilar adenopathy. Lungs/Pleura: Emphysema with diffuse bronchial wall thickening. Mild interlobular septal thickening noted within the lower lung zones. Dependent atelectasis noted within the lung bases peripherally. No pleural effusion, pneumothorax or airspace consolidation. Perifissural nodule along the minor fissure measures 4 mm, image 88/6. Upper Abdomen: No acute abnormality. Gallstone is identified measuring 2.9 cm, image 175/5. Musculoskeletal: No chest wall abnormality. No acute or significant osseous findings. Review of the MIP images confirms the above findings. IMPRESSION: 1. No evidence for acute pulmonary embolus. 2. Mild interlobular septal thickening within the lower lung zones may reflect mild pulmonary edema. 3. Circumferential wall thickening involving the distal esophagus. Correlate for any clinical signs or symptoms of esophagitis. 4. Cholelithiasis. 5. Coronary artery calcifications. 6. Aortic Atherosclerosis (ICD10-I70.0) and Emphysema (ICD10-J43.9). Electronically Signed   By: Kimberley Penman M.D.   On: 10/26/2023 06:12   DG Chest 2 View Result Date: 10/25/2023 CLINICAL DATA:  CP EXAM: CHEST - 2 VIEW COMPARISON:  None available. FINDINGS: No focal airspace consolidation, pleural effusion, or pneumothorax. No cardiomegaly. No acute fracture or destructive lesion. Multilevel thoracic osteophytosis. IMPRESSION: No acute cardiopulmonary abnormality. Electronically  Signed   By: Rance Burrows M.D.   On: 10/25/2023 17:56   Patient Profile     58 y.o. female with history of DM, etoh use, tobacco abuse admitted with chest pain and found to have DKA. Negative troponin and no ischemic EKG changes  Assessment & Plan    Chest pain: Normal echo with normal LV function without wall motion abnormalities. Negative troponin. Coronary CTA 10/26/23 with moderate RCA stenosis with CT FFR pending. Mild disease in the LAD and Circumflex. Will follow up on CT FFR today. If the RCA flow is impaired, she will need cardiac cath before discharge.  -She should be started on ASA and a statin given finding of CAD  For questions or updates, please contact [facility] Please consult [email or web address] for contact info under     Signed, Antoinette Batman, MD  10/27/2023, 7:16 AM

## 2023-10-27 NOTE — Progress Notes (Signed)
 Orthopedic Tech Progress Note Patient Details:  Nancy [name] 1965-06-22 [id number]  Ortho Devices Type of Ortho Device: Crutches Ortho Device/Splint Interventions: Ordered, Adjustment      Dareld Mcauliffe A Moriah Shawley 10/27/2023, 1:22 PM

## 2023-10-27 NOTE — Plan of Care (Signed)

## 2023-10-27 NOTE — Progress Notes (Signed)
 Asked pt if we could put tele back on her, she is refusing at the moment.

## 2023-10-27 NOTE — Progress Notes (Signed)
 Paged ortho tech for crunches

## 2023-10-27 NOTE — Progress Notes (Signed)
 Tech stated CBG 379 this am

## 2023-10-28 ENCOUNTER — Other Ambulatory Visit (HOSPITAL_COMMUNITY): Payer: Self-pay

## 2023-10-28 ENCOUNTER — Other Ambulatory Visit: Payer: Self-pay

## 2023-10-28 DIAGNOSIS — E1110 Type 2 diabetes mellitus with ketoacidosis without coma: Secondary | ICD-10-CM | POA: Diagnosis not present

## 2023-10-28 DIAGNOSIS — R079 Chest pain, unspecified: Secondary | ICD-10-CM | POA: Diagnosis not present

## 2023-10-28 DIAGNOSIS — E119 Type 2 diabetes mellitus without complications: Secondary | ICD-10-CM | POA: Diagnosis not present

## 2023-10-28 DIAGNOSIS — N179 Acute kidney failure, unspecified: Secondary | ICD-10-CM | POA: Diagnosis not present

## 2023-10-28 DIAGNOSIS — I25118 Atherosclerotic heart disease of native coronary artery with other forms of angina pectoris: Secondary | ICD-10-CM | POA: Diagnosis not present

## 2023-10-28 LAB — BASIC METABOLIC PANEL WITH GFR
Anion gap: 8 (ref 5–15)
BUN: 18 mg/dL (ref 6–20)
CO2: 33 mmol/L — ABNORMAL HIGH (ref 22–32)
Calcium: 9.1 mg/dL (ref 8.9–10.3)
Chloride: 95 mmol/L — ABNORMAL LOW (ref 98–111)
Creatinine, Ser: 0.62 mg/dL (ref 0.44–1.00)
GFR, Estimated: >60 mL/min (ref >60)
Glucose, Bld: 272 mg/dL — ABNORMAL HIGH (ref 70–99)
Potassium: 4.1 mmol/L (ref 3.5–5.1)
Sodium: 136 mmol/L (ref 135–145)

## 2023-10-28 LAB — GLUCOSE, CAPILLARY
Glucose-Capillary: 228 mg/dL — ABNORMAL HIGH (ref 70–99)
Glucose-Capillary: 402 mg/dL — ABNORMAL HIGH (ref 70–99)
Glucose-Capillary: 408 mg/dL — ABNORMAL HIGH (ref 70–99)
Glucose-Capillary: 423 mg/dL — ABNORMAL HIGH (ref 70–99)
Glucose-Capillary: 352 mg/dL — ABNORMAL HIGH (ref 70–99)
Glucose-Capillary: 417 mg/dL — ABNORMAL HIGH (ref 70–99)

## 2023-10-28 MED ORDER — INSULIN ASPART 100 UNIT/ML IJ SOLN
20.0000 [IU] | INTRAMUSCULAR | Status: AC
  Administered 2023-10-28: 20 [IU] via SUBCUTANEOUS

## 2023-10-28 MED ORDER — ALBUTEROL SULFATE (2.5 MG/3ML) 0.083% IN NEBU: 2.5000 mg | INHALATION_SOLUTION | RESPIRATORY_TRACT | Status: DC | PRN

## 2023-10-28 MED ORDER — NICOTINE 21 MG/24HR TD PT24
21.0000 mg | MEDICATED_PATCH | Freq: Every day | TRANSDERMAL | 0 refills | Status: DC
  Filled 2023-10-28: qty 28, 28d supply, fill #0

## 2023-10-28 MED ORDER — INSULIN GLARGINE-YFGN 100 UNIT/ML ~~LOC~~ SOLN
20.0000 [IU] | Freq: Every day | SUBCUTANEOUS | Status: DC
  Administered 2023-10-28: 20 [IU] via SUBCUTANEOUS
  Filled 2023-10-28: qty 0.2

## 2023-10-28 NOTE — TOC Progression Note (Signed)
 Transition of Care [facility]) - Progression Note    Patient Details  Name: Nancy [name] MRN: [id number] Date of Birth: May 27, 1966  [facility] CM/SW Contact  Dane Dung, RN Phone Number: 10/28/2023, 10:12 AM  Clinical Narrative:    CM called and spoke with [facility] pharmacy and they are unable to fill the medications at the bedside.  I asked that the pharmacy return the meds to the system so that [facility] pharmacy could deliver to the bedside.  Patient has limited mobility with crutches and will need meds delivered to the bedside and transportation home via taxi.        Expected Discharge Plan and Services                                               Social Determinants of Health (SDOH) Interventions SDOH Screenings   Food Insecurity: Food Insecurity Present (10/26/2023)  Housing: High Risk (10/26/2023)  Transportation Needs: Unmet Transportation Needs (10/26/2023)  Utilities: At Risk (10/26/2023)  Financial Resource Strain: Not on File (10/08/2021)   Received from [place], [place]  Physical Activity: Not on File (10/08/2021)   Received from [place], [place]  Social Connections: Socially Isolated (10/26/2023)  Stress: Not on File (10/08/2021)   [facility]  Tobacco Use: High Risk (10/26/2023)    Readmission Risk Interventions     No data to display

## 2023-10-28 NOTE — Plan of Care (Signed)

## 2023-10-28 NOTE — Assessment & Plan Note (Addendum)
-   see DKA - poorly controlled and we've discussed diet and need for insulin  - A1c greater than 15% - Insulin  continued at discharge -She understands the strong need for modifying her diet as she is very noncompliant with a diabetic diet - She also plans to follow-up outpatient with primary care

## 2023-10-28 NOTE — Progress Notes (Signed)
 MD made aware CBG is 417. Ordered 20 units of novolog 

## 2023-10-28 NOTE — Progress Notes (Signed)
 Progress Note    Nancy [name]   OZD:[id number]  DOB: Aug 20, 1965  DOA: 10/25/2023     0 PCP: Harry Lindau, FNP  Initial CC: CP  Hospital Course: Nancy [name] is a 58 year old female with PMH DM II, tobacco use, alcohol use, depression, GERD who presented with complaints of chest pain ongoing for approximately 2 days.  She endorsed being off her medicines for approximately 1 year mostly due to financial constraints.  She states that she does not pay for the cigarettes or alcohol that she uses. On workup, she was found to be in DKA and started on insulin  drip.  She was admitted for further chest pain workup as well and cardiology was consulted.  Interval History:  Resting comfortably in bed this morning.  Sugars still elevated but not following adequate diet. Awaiting CT results and review with cardiology.   Assessment and Plan: * Chest pain - UDS noted with cocaine too - atypical but does have risk factors; compliance also a concern - appreciate cardiology evaluation; planing for CTA tomorrow - echo: EF 55-60%, no RWMA, normal diastology  - trops neg as well as EKG - follow up FFR CT for cardiology clearance vs if abnormal then plan seems to be inpatient cath  DMII (diabetes mellitus, type 2) (HCC) - see DKA - poorly controlled and we've discussed diet and need for insulin   DKA, type 2 (HCC)-resolved as of 10/28/2023 - A1c > 15.5%, not unsurprising given result and poor dietary compliance - Noncompliant on home insulin  due to financial constraints; will need to find what will be affordable prior to discharge - She did not tolerate metformin  due to GI side effects, therefore will not be resumed - continue Semglee  and SSI  AKI (acute kidney injury) (HCC)-resolved as of 10/26/2023 - baseline creatinine ~ 0.7 - patient presents with increase in creat >0.3 mg/dL above baseline or creat increase >1.5x baseline presumed to have occurred within past 7 days PTA - creat 1.01 on admission -  s/p IVF - back to baseline   Normocytic anemia - With underlying alcohol abuse, at risk for folate or B12 deficiency; both normal  Hypokalemia - Replete as needed  Osteoarthritis - Noted in her hips.  Follows outpatient with Dr. Lucienne Ryder, orthopedic surgery  Primary osteoarthritis of left hip - Continue Tylenol  for pain control  Depression with anxiety - Patient endorses having been on Lexapro  in the past and wishes to restart  Alcohol abuse - no w/d; d/c CIWA   Old records reviewed in assessment of this patient  Antimicrobials:   DVT prophylaxis:  enoxaparin  (LOVENOX ) injection 40 mg Start: 10/26/23 1000   Code Status:   Code Status: Full Code  Mobility Assessment (Last 72 Hours)     Mobility Assessment     Row Name 10/27/23 1950 10/27/23 0830 10/26/23 2040       Does patient have an order for bedrest or is patient medically unstable No - Continue assessment No - Continue assessment No - Continue assessment     What is the highest level of mobility based on the progressive mobility assessment? Level 5 (Walks with assist in room/hall) - Balance while stepping forward/back and can walk in room with assist - Complete Level 5 (Walks with assist in room/hall) - Balance while stepping forward/back and can walk in room with assist - Complete Level 5 (Walks with assist in room/hall) - Balance while stepping forward/back and can walk in room with assist - Complete  Barriers to discharge: None Disposition Plan: Home HH orders placed: N/A Status is: Observation  Objective: Blood pressure 102/76, pulse 85, temperature 98 F (36.7 C), temperature source Oral, resp. rate 20, height 5\' 9"  (1.753 m), weight 74.8 kg, SpO2 97%.  Examination:  Physical Exam Constitutional:      Appearance: Normal appearance.  HENT:     Head: Normocephalic and atraumatic.     Mouth/Throat:     Mouth: Mucous membranes are moist.  Eyes:     Extraocular Movements: Extraocular  movements intact.  Cardiovascular:     Rate and Rhythm: Normal rate and regular rhythm.  Pulmonary:     Effort: Pulmonary effort is normal. No respiratory distress.     Breath sounds: Normal breath sounds. No wheezing.  Abdominal:     General: Bowel sounds are normal. There is no distension.     Palpations: Abdomen is soft.     Tenderness: There is no abdominal tenderness.  Musculoskeletal:        General: Normal range of motion.     Cervical back: Normal range of motion and neck supple.  Skin:    General: Skin is warm and dry.  Neurological:     General: No focal deficit present.     Mental Status: She is alert.  Psychiatric:        Mood and Affect: Mood normal.      Consultants:  Cardiology  Procedures:    Data Reviewed: Results for orders placed or performed during the hospital encounter of 10/25/23 (from the past 24 hours)  Glucose, capillary     Status: Abnormal   Collection Time: 10/27/23 12:16 PM  Result Value Ref Range   Glucose-Capillary 272 (H) 70 - 99 mg/dL  Glucose, capillary     Status: Abnormal   Collection Time: 10/27/23  4:30 PM  Result Value Ref Range   Glucose-Capillary 228 (H) 70 - 99 mg/dL  Glucose, capillary     Status: Abnormal   Collection Time: 10/27/23  6:46 PM  Result Value Ref Range   Glucose-Capillary 402 (H) 70 - 99 mg/dL  Glucose, capillary     Status: Abnormal   Collection Time: 10/27/23  8:20 PM  Result Value Ref Range   Glucose-Capillary 408 (H) 70 - 99 mg/dL  Glucose, capillary     Status: Abnormal   Collection Time: 10/27/23 10:24 PM  Result Value Ref Range   Glucose-Capillary 423 (H) 70 - 99 mg/dL  Glucose, random     Status: Abnormal   Collection Time: 10/27/23 11:14 PM  Result Value Ref Range   Glucose, Bld 476 (H) 70 - 99 mg/dL  Basic metabolic panel     Status: Abnormal   Collection Time: 10/28/23  1:26 AM  Result Value Ref Range   Sodium 136 135 - 145 mmol/L   Potassium 4.1 3.5 - 5.1 mmol/L   Chloride 95 (L) 98 - 111  mmol/L   CO2 33 (H) 22 - 32 mmol/L   Glucose, Bld 272 (H) 70 - 99 mg/dL   BUN 18 6 - 20 mg/dL   Creatinine, Ser 7.82 0.44 - 1.00 mg/dL   Calcium  9.1 8.9 - 10.3 mg/dL   GFR, Estimated >95 >62 mL/min   Anion gap 8 5 - 15  Glucose, capillary     Status: Abnormal   Collection Time: 10/28/23  4:57 AM  Result Value Ref Range   Glucose-Capillary 352 (H) 70 - 99 mg/dL    I have reviewed pertinent nursing notes,  vitals, labs, and images as necessary. I have ordered labwork to follow up on as indicated.  I have reviewed the last notes from staff over past 24 hours. I have discussed patient's care plan and test results with nursing staff, CM/SW, and other staff as appropriate.  Time spent: Greater than 50% of the 55 minute visit was spent in counseling/coordination of care for the patient as laid out in the A&P.  Late entry: Patient seen and rounded on 10/27/23.    LOS: 0 days   Faith Homes, MD [facility] 10/28/2023, 8:16 AM

## 2023-10-28 NOTE — Inpatient Diabetes Management (Signed)
 Inpatient Diabetes Program Recommendations  AACE/ADA: New Consensus Statement on Inpatient Glycemic Control   Target Ranges:  Prepandial:   less than 140 mg/dL      Peak postprandial:   less than 180 mg/dL (1-2 hours)      Critically ill patients:  140 - 180 mg/dL     Latest Reference Range & Units 10/27/23 07:21 10/27/23 12:16 10/27/23 16:30 10/27/23 18:46 10/27/23 20:20 10/27/23 22:24 10/28/23 04:57 10/28/23 08:02  Glucose-Capillary 70 - 99 mg/dL 865 (H) 784 (H) 696 (H) 402 (H) 408 (H) 423 (H) 352 (H) 417 (H)    Latest Reference Range & Units 07/08/17 09:01 10/26/23 02:21  Hemoglobin A1C 4.8 - 5.6 % 14.6 >15.5 (H)   Review of Glycemic Control  Diabetes history: DM2 Outpatient Diabetes medications: None; had been on insulin  but could not afford copays Current orders for Inpatient glycemic control: Semglee  20 units daily, Novolog  0-15 units TID, Novolog  0-5 units QHS  Inpatient Diabetes Program Recommendations:    Insulin : Noted Semglee  increased from 18 to 20 units daily. Please increase Semglee  to 30 units daily (also order one time Semglee  10 units x1 now for total of 30 units today) and ordering Novolog  6 units TID with meals for meal coverage if patient eats at least 50% of meals.  Outpatient DM: Would recommend to get meds to bed prior to discharge.  At time of discharge patient will need Rx for glucose monitoring kit (#[id number]), Lantus  pens (#[id number]), Novolog  pens (#[id number]), insulin  pen needles (#[id number]).  Thanks Beacher Limerick, RN, MSN, [facility] Diabetes Coordinator Inpatient Diabetes Program [phone] (Team Pager from 8am to 5pm)

## 2023-10-28 NOTE — Discharge Summary (Signed)
 Physician Discharge Summary   Nancy [name] UJW:[id number] DOB: [birth year] DOA: 10/25/2023  PCP: Harry Lindau, FNP  Admit date: 10/25/2023 Discharge date: 10/28/2023  Admitted From: Home Disposition: Home Discharging physician: Faith Homes, MD Barriers to discharge: None  Recommendations at discharge: Adjust insulin  regimen at follow-up with primary care Encourage ongoing smoking and alcohol reduction/cessation   Discharge Condition: stable CODE STATUS: Full Diet recommendation:  Diet Orders (From admission, onward)     Start     Ordered   10/28/23 0000  Diet - low sodium heart healthy        10/28/23 1043   10/28/23 0000  Diet Carb Modified        10/28/23 1043   10/26/23 0221  Diet Carb Modified Fluid consistency: Thin; Room service appropriate? Yes  (Diabetes Ketoacidosis (DKA))  Diet effective now       Question Answer Comment  Diet-HS Snack? Nothing   Calorie Level Medium 1600-2000   Fluid consistency: Thin   Room service appropriate? Yes      10/26/23 0221            Hospital Course: Nancy [name] is a 58 year old female with PMH DM II, tobacco use, alcohol use, depression, GERD who presented with complaints of chest pain ongoing for approximately 2 days.  She endorsed being off her medicines for approximately 1 year mostly due to financial constraints.  She states that she does not pay for the cigarettes or alcohol that she uses. On workup, she was found to be in DKA and started on insulin  drip.  She was admitted for further chest pain workup as well and cardiology was consulted.  Assessment and Plan: * Chest pain-resolved as of 10/28/2023 - UDS noted with cocaine too - atypical but does have risk factors; compliance also a concern - appreciate cardiology evaluation; planing for CTA tomorrow - echo: EF 55-60%, no RWMA, normal diastology  - trops neg as well as EKG - Coronary CT obtained by cardiology but unable to process CT FFR of the RCA lesion due to motion  artifact.  Patient cleared for discharge home per cardiology with no further inpatient ischemic workup necessary at this time - She was continued on aspirin  and statin at discharge with strong recommendations for smoking and alcohol cessation  DMII (diabetes mellitus, type 2) (HCC) - see DKA - poorly controlled and we've discussed diet and need for insulin  - A1c greater than 15% - Insulin  continued at discharge -She understands the strong need for modifying her diet as she is very noncompliant with a diabetic diet - She also plans to follow-up outpatient with primary care  DKA, type 2 (HCC)-resolved as of 10/28/2023 - A1c > 15.5%, not unsurprising given result and poor dietary compliance - Noncompliant on home insulin  due to financial constraints; will need to find what will be affordable prior to discharge - She did not tolerate metformin  due to GI side effects, therefore will not be resumed - continue Semglee  and SSI  AKI (acute kidney injury) (HCC)-resolved as of 10/26/2023 - baseline creatinine ~ 0.7 - patient presents with increase in creat >0.3 mg/dL above baseline or creat increase >1.5x baseline presumed to have occurred within past 7 days PTA - creat 1.01 on admission - s/p IVF - back to baseline   Normocytic anemia - With underlying alcohol abuse, at risk for folate or B12 deficiency; both normal  Hypokalemia - Replete as needed  Osteoarthritis - Noted in her hips.  Follows outpatient with Dr. Lucienne Ryder,  orthopedic surgery  Primary osteoarthritis of left hip - Continue Tylenol  for pain control  Depression with anxiety - Patient endorses having been on Lexapro  in the past and wishes to restart  Alcohol abuse - no w/d; d/c CIWA   The patient's acute and chronic medical conditions were treated accordingly. On day of discharge, patient was felt deemed stable for discharge. Patient/family member advised to call PCP or come back to ER if needed.   Principal Diagnosis:  Chest pain  Discharge Diagnoses: Active Hospital Problems   Diagnosis Date Noted   DMII (diabetes mellitus, type 2) (HCC) 07/29/2017    Priority: 2.   Protein-calorie malnutrition, severe 10/27/2023   Osteoarthritis 10/26/2023   Hypokalemia 10/26/2023   Normocytic anemia 10/26/2023   Primary osteoarthritis of left hip 02/10/2016   Depression with anxiety 10/29/2015   Alcohol abuse 12/05/2013    Resolved Hospital Problems   Diagnosis Date Noted Date Resolved   Chest pain 10/26/2023 10/28/2023    Priority: 1.   DKA, type 2 (HCC) 10/26/2023 10/28/2023    Priority: 2.   AKI (acute kidney injury) (HCC) 10/26/2023 10/26/2023    Priority: 3.     Discharge Instructions     Diet - low sodium heart healthy   Complete by: As directed    Diet Carb Modified   Complete by: As directed    Increase activity slowly   Complete by: As directed       Allergies as of 10/28/2023       Reactions   No Known Allergies         Medication List     TAKE these medications    Accu-Chek Guide Test test strip Generic drug: glucose blood Use to check blood sugar 4 (four) times daily -  before meals and at bedtime.   Accu-Chek Guide w/Device Kit Use as directed   Accu-Chek Softclix Lancets lancets Use as directed to check blood sugar 4 (four) times daily -  before meals and at bedtime.   aspirin  EC 81 MG tablet Take 1 tablet (81 mg total) by mouth daily. Swallow whole.   atorvastatin  40 MG tablet Commonly known as: LIPITOR Take 1 tablet (40 mg total) by mouth daily.   escitalopram  10 MG tablet Commonly known as: LEXAPRO  Take 1 tablet (10 mg total) by mouth daily.   Lantus  SoloStar 100 UNIT/ML Solostar Pen Generic drug: insulin  glargine Inject 20 Units into the skin daily.   nicotine  21 mg/24hr patch Commonly known as: NICODERM CQ  - dosed in mg/24 hours Place 1 patch (21 mg total) onto the skin daily. Start taking on: Oct 29, 2023   NovoLOG  FlexPen 100 UNIT/ML  FlexPen Generic drug: insulin  aspart Inject 0-18 Units into the skin 4 (four) times daily -  before meals and at bedtime. Check Blood Glucose (BG) and inject per scale: BG <150= 0 unit; BG 150-200= 3 unit; BG 201-250= 6 unit; BG 251-300= 9 unit; BG 301-350= 12 unit; BG 351-400= 15 unit; BG >400= 18 unit and Call Primary Care.   Pen Needles 31G X 5 MM Misc Use with insulin  4 (four) times daily -  before meals and at bedtime.   TRUEdraw Lancing Device Misc Use (three) times daily as directed   Ventolin  HFA 108 (90 Base) MCG/ACT inhaler Generic drug: albuterol  Inhale 1-2 puffs into the lungs every 4 (four) hours as needed for wheezing or shortness of breath.               Durable Medical  Equipment  (From admission, onward)           Start     Ordered   10/27/23 1104  For home use only DME Crutches  Once       Comments: Physical deconditioning.  Please provide crutches before sending patient home.   10/27/23 1103            Follow-up Information     Harry Lindau, FNP .   Specialty: Family Medicine Why: Please call the office and schedule a hospital follow up in the next 7-10 days. Contact information: [address] [place] [address] [phone]                Allergies  Allergen Reactions   No Known Allergies     Consultations: Cardiology  Procedures:   Discharge Exam: BP 102/76 (BP Location: Right Arm)   Pulse 85   Temp 98 F (36.7 C) (Oral)   Resp 20   Ht 5\' 9"  (1.753 m)   Wt 74.8 kg   SpO2 97%   BMI 24.37 kg/m  Physical Exam Constitutional:      Appearance: Normal appearance.  HENT:     Head: Normocephalic and atraumatic.     Mouth/Throat:     Mouth: Mucous membranes are moist.  Eyes:     Extraocular Movements: Extraocular movements intact.  Cardiovascular:     Rate and Rhythm: Normal rate and regular rhythm.  Pulmonary:     Effort: Pulmonary effort is normal. No respiratory distress.     Breath sounds: Normal  breath sounds. No wheezing.  Abdominal:     General: Bowel sounds are normal. There is no distension.     Palpations: Abdomen is soft.     Tenderness: There is no abdominal tenderness.  Musculoskeletal:        General: Normal range of motion.     Cervical back: Normal range of motion and neck supple.  Skin:    General: Skin is warm and dry.  Neurological:     General: No focal deficit present.     Mental Status: She is alert.  Psychiatric:        Mood and Affect: Mood normal.      The results of significant diagnostics from this hospitalization (including imaging, microbiology, ancillary and laboratory) are listed below for reference.   Microbiology: Recent Results (from the past 240 hours)  Resp panel by RT-PCR (RSV, Flu A&B, Covid) Anterior Nasal Swab     Status: None   Collection Time: 10/25/23  6:48 PM   Specimen: Anterior Nasal Swab  Result Value Ref Range Status   SARS Coronavirus 2 by RT PCR NEGATIVE NEGATIVE Final   Influenza A by PCR NEGATIVE NEGATIVE Final   Influenza B by PCR NEGATIVE NEGATIVE Final    Comment: (NOTE) The Xpert Xpress SARS-CoV-2/FLU/RSV plus assay is intended as an aid in the diagnosis of influenza from Nasopharyngeal swab specimens and should not be used as a sole basis for treatment. Nasal washings and aspirates are unacceptable for Xpert Xpress SARS-CoV-2/FLU/RSV testing.  Fact Sheet for Patients: BloggerCourse.com  Fact Sheet for Healthcare Providers: SeriousBroker.it  This test is not yet approved or cleared by the [place]  FDA and has been authorized for detection and/or diagnosis of SARS-CoV-2 by FDA under an Emergency Use Authorization (EUA). This EUA will remain in effect (meaning this test can be used) for the duration of the COVID-19 declaration under Section 564(b)(1) of the Act, 21 U.S.C. section  360bbb-3(b)(1), unless the authorization is terminated or revoked.     Resp  Syncytial Virus by PCR NEGATIVE NEGATIVE Final    Comment: (NOTE) Fact Sheet for Patients: BloggerCourse.com  Fact Sheet for Healthcare Providers: SeriousBroker.it  This test is not yet approved or cleared by the [place]  FDA and has been authorized for detection and/or diagnosis of SARS-CoV-2 by FDA under an Emergency Use Authorization (EUA). This EUA will remain in effect (meaning this test can be used) for the duration of the COVID-19 declaration under Section 564(b)(1) of the Act, 21 U.S.C. section 360bbb-3(b)(1), unless the authorization is terminated or revoked.  Performed at [facility], [address] [place], [place] [address]      Labs: BNP (last 3 results) Recent Labs    10/25/23 1735  BNP 27.0   Basic Metabolic Panel: Recent Labs  Lab 10/25/23 1738 10/25/23 1853 10/26/23 0220 10/26/23 4098 10/26/23 2057 10/26/23 2234 10/27/23 2314 10/28/23 0126  NA 135 134* 136 135  --  132*  --  136  K 4.2 4.3 3.1* 3.7  --  3.7  --  4.1  CL 97*  --  100 100  --  95*  --  95*  CO2 24  --  25 25  --  28  --  33*  GLUCOSE 604*  --  230* 172* 527* 493* 476* 272*  BUN 16  --  16 17  --  12  --  18  CREATININE 1.01*  --  0.50 0.47  --  0.69  --  0.62  CALCIUM  8.6*  --  8.9 8.5*  --  8.8*  --  9.1   Liver Function Tests: No results for input(s): "AST", "ALT", "ALKPHOS", "BILITOT", "PROT", "ALBUMIN" in the last 168 hours. No results for input(s): "LIPASE", "AMYLASE" in the last 168 hours. No results for input(s): "AMMONIA" in the last 168 hours. CBC: Recent Labs  Lab 10/25/23 1738 10/25/23 1853 10/26/23 0145  WBC 7.7  --  8.3  HGB 12.9 12.6 11.7*  HCT 37.7 37.0 34.3*  MCV 93.3  --  92.7  PLT 238  --  204   Cardiac Enzymes: No results for input(s): "CKTOTAL", "CKMB", "CKMBINDEX", "TROPONINI" in the last 168 hours. BNP: Invalid input(s): "POCBNP" CBG: Recent Labs  Lab 10/27/23 1846 10/27/23 2020  10/27/23 2224 10/28/23 0457 10/28/23 0802  GLUCAP 402* 408* 423* 352* 417*   D-Dimer No results for input(s): "DDIMER" in the last 72 hours. Hgb A1c Recent Labs    10/26/23 0221  HGBA1C >15.5*   Lipid Profile No results for input(s): "CHOL", "HDL", "LDLCALC", "TRIG", "CHOLHDL", "LDLDIRECT" in the last 72 hours. Thyroid  function studies Recent Labs    10/25/23 1735  TSH 1.671   Anemia work up Recent Labs    10/26/23 0145 10/26/23 0614  VITAMINB12 430  --   FOLATE  --  15.3   Urinalysis    Component Value Date/Time   COLORURINE YELLOW 10/25/2023 1716   APPEARANCEUR CLEAR 10/25/2023 1716   LABSPEC 1.015 10/25/2023 1716   PHURINE 6.0 10/25/2023 1716   GLUCOSEU >=500 (A) 10/25/2023 1716   HGBUR NEGATIVE 10/25/2023 1716   BILIRUBINUR NEGATIVE 10/25/2023 1716   KETONESUR >80 (A) 10/25/2023 1716   PROTEINUR NEGATIVE 10/25/2023 1716   UROBILINOGEN 0.2 02/11/2012 0858   NITRITE NEGATIVE 10/25/2023 1716   LEUKOCYTESUR NEGATIVE 10/25/2023 1716   Sepsis Labs Recent Labs  Lab 10/25/23 1738 10/26/23 0145  WBC 7.7 8.3   Microbiology Recent Results (from  the past 240 hours)  Resp panel by RT-PCR (RSV, Flu A&B, Covid) Anterior Nasal Swab     Status: None   Collection Time: 10/25/23  6:48 PM   Specimen: Anterior Nasal Swab  Result Value Ref Range Status   SARS Coronavirus 2 by RT PCR NEGATIVE NEGATIVE Final   Influenza A by PCR NEGATIVE NEGATIVE Final   Influenza B by PCR NEGATIVE NEGATIVE Final    Comment: (NOTE) The Xpert Xpress SARS-CoV-2/FLU/RSV plus assay is intended as an aid in the diagnosis of influenza from Nasopharyngeal swab specimens and should not be used as a sole basis for treatment. Nasal washings and aspirates are unacceptable for Xpert Xpress SARS-CoV-2/FLU/RSV testing.  Fact Sheet for Patients: BloggerCourse.com  Fact Sheet for Healthcare Providers: SeriousBroker.it  This test is not yet  approved or cleared by the [place]  FDA and has been authorized for detection and/or diagnosis of SARS-CoV-2 by FDA under an Emergency Use Authorization (EUA). This EUA will remain in effect (meaning this test can be used) for the duration of the COVID-19 declaration under Section 564(b)(1) of the Act, 21 U.S.C. section 360bbb-3(b)(1), unless the authorization is terminated or revoked.     Resp Syncytial Virus by PCR NEGATIVE NEGATIVE Final    Comment: (NOTE) Fact Sheet for Patients: BloggerCourse.com  Fact Sheet for Healthcare Providers: SeriousBroker.it  This test is not yet approved or cleared by the [place]  FDA and has been authorized for detection and/or diagnosis of SARS-CoV-2 by FDA under an Emergency Use Authorization (EUA). This EUA will remain in effect (meaning this test can be used) for the duration of the COVID-19 declaration under Section 564(b)(1) of the Act, 21 U.S.C. section 360bbb-3(b)(1), unless the authorization is terminated or revoked.  Performed at [facility], [address] [place], [place] [address]     Procedures/Studies: CT CORONARY MORPH W/CTA COR W/SCORE W/CA W/CM &/OR WO/CM Addendum Date: 10/26/2023 ADDENDUM REPORT: 10/26/2023 21:36 CLINICAL DATA:  This is a 58 year old female with anginal symptoms EXAM: Cardiac/Coronary  CTA TECHNIQUE: The patient was scanned on a [employer]. FINDINGS: A 100 kV prospective scan was triggered in the descending thoracic aorta at 111 HU's. Axial non-contrast 3 mm slices were carried out through the heart. The data set was analyzed on a dedicated work station and scored using the Agatson method. Gantry rotation speed was 250 msecs and collimation was .6 mm. No beta blockade and 0.8 mg of sl NTG was given. The 3D data set was reconstructed in 5% intervals of the 67-82 % of the R-R cycle. Diastolic phases were analyzed on a dedicated work station  using MPR, MIP and VRT modes. The patient received 80 cc of contrast. Image Quality: Fair with misregistration artifact. Aorta: Normal 28.8 mm size.  No calcifications.  No dissection. Aortic Valve:  Trileaflet.  No calcifications. Coronary Arteries:  Normal coronary origin.  Right Dominance. RCA is a large dominant artery that gives rise to PDA and PLA. The proximal RCA with no plaques. The proximal to mid RCA has multiple plaques: The first plaque is mild focal calcification. The right beneath it lies two separate moderate (50 to 69%) calcified plaques. Stairstep artifact noted in the mid to distal RCA. In the distal RCA there is no apparent plaque. The PLA and PDA does not have any plaques. Left main is a large artery that gives rise to LAD and LCX arteries. LAD is a large vessel. The proximal LAD with mild focal calcified plaque. At  the proximal to mid LAD there is a minimal (<24%) focal calcified plaque. The mid to distal LAD with no plaques. The D1 vessel is a medium size vessel with minimal focal mixed plaque in the proximal portion of the vessel. D2 is a small caliber vessel minimal focal calcified plaque in the midportion of the vessel. LCX is a non-dominant artery that gives rise to one large OM1 branch. The left circumflex artery with a mild focal calcification in the proximal portion of the vessel. In the proximal to mid vessel there are diffuse minimal calcifications. No plaques seen in the distal portion of the left circumflex. The OM1 vessel is a medium size vessel there are diffuse minimal calcification in the proximal to midportion of the vessel. Coronary Calcium  Score: Left main: 0 Left anterior descending artery: 86.9 Left circumflex artery: 72.7 Right coronary artery: 320 Total: 488 Percentile: 99 Other findings: Normal pulmonary vein drainage into the left atrium. Normal left atrial appendage without a thrombus. Normal size of the pulmonary artery. IMPRESSION: 1. Coronary calcium  score of 488.  This was 54 percentile for age and sex matched control. 2. Normal coronary origin with right dominance. 3. CAD-RADS 3. Moderate stenosis. Consider symptom-guided anti-ischemic pharmacotherapy as well as risk factor modification per guideline directed care. Additional analysis with CT FFR will be submitted The noncardiac portion of this study will be interpreted in separate report by the radiologist. Electronically Signed   By: Kardie  Tobb D.O.   On: 10/26/2023 21:36   Result Date: 10/26/2023 EXAM: OVER-READ INTERPRETATION  CT CHEST The following report is a limited chest CT over-read performed by radiologist Dr. Tyron Gallon of [facility] on 10/26/2023. This over-read does not include interpretation of cardiac or coronary anatomy or pathology. The coronary CTA interpretation by the cardiologist is attached. COMPARISON:  None Available. FINDINGS: There is a small hiatal hernia. Otherwise, visualized upper abdomen, lower lungs and lower mediastinum are within normal limits. No significant osseous abnormality. IMPRESSION: Small hiatal hernia. Electronically Signed: By: Tyron Gallon M.D. On: 10/26/2023 15:53   ECHOCARDIOGRAM COMPLETE Result Date: 10/26/2023    ECHOCARDIOGRAM REPORT   Patient Name:   Nancy [name] Date of Exam: 10/26/2023 Medical Rec #:  [id number]       Height:       69.0 in Accession #:    [id number]      Weight:       165.0 lb Date of Birth:  07/30/1965        BSA:          1.904 m Patient Age:    57 years        BP:           107/72 mmHg Patient Gender: F               HR:           72 bpm. Exam Location:  Inpatient Procedure: 2D Echo, Color Doppler and Cardiac Doppler (Both Spectral and Color            Flow Doppler were utilized during procedure). Indications:    R07.9* Chest pain, unspecified  History:        Patient has no prior history of Echocardiogram examinations.                 Risk Factors:Diabetes, Dyslipidemia and Hypertension.  Sonographer:    Sherline Distel Senior RDCS Referring  Phys: [id number] ARSHAD N KAKRAKANDY IMPRESSIONS  1. Left ventricular ejection fraction, by  estimation, is 55 to 60%. The left ventricle has normal function. The left ventricle has no regional wall motion abnormalities. Left ventricular diastolic parameters were normal.  2. Right ventricular systolic function is normal. The right ventricular size is normal.  3. The mitral valve is normal in structure. No evidence of mitral valve regurgitation. No evidence of mitral stenosis.  4. The aortic valve is normal in structure. Aortic valve regurgitation is not visualized. No aortic stenosis is present.  5. The inferior vena cava is normal in size with greater than 50% respiratory variability, suggesting right atrial pressure of 3 mmHg. FINDINGS  Left Ventricle: Left ventricular ejection fraction, by estimation, is 55 to 60%. The left ventricle has normal function. The left ventricle has no regional wall motion abnormalities. The left ventricular internal cavity size was normal in size. There is  no left ventricular hypertrophy. Left ventricular diastolic parameters were normal. Right Ventricle: The right ventricular size is normal. No increase in right ventricular wall thickness. Right ventricular systolic function is normal. Left Atrium: Left atrial size was normal in size. Right Atrium: Right atrial size was normal in size. Pericardium: There is no evidence of pericardial effusion. Mitral Valve: The mitral valve is normal in structure. No evidence of mitral valve regurgitation. No evidence of mitral valve stenosis. Tricuspid Valve: The tricuspid valve is normal in structure. Tricuspid valve regurgitation is not demonstrated. No evidence of tricuspid stenosis. Aortic Valve: The aortic valve is normal in structure. Aortic valve regurgitation is not visualized. No aortic stenosis is present. Pulmonic Valve: The pulmonic valve was normal in structure. Pulmonic valve regurgitation is not visualized. No evidence of pulmonic stenosis.  Aorta: The aortic root is normal in size and structure. Venous: The inferior vena cava is normal in size with greater than 50% respiratory variability, suggesting right atrial pressure of 3 mmHg. IAS/Shunts: No atrial level shunt detected by color flow Doppler.  LEFT VENTRICLE PLAX 2D LVIDd:         3.60 cm      Diastology LVIDs:         3.10 cm      LV e' medial:    6.64 cm/s LV PW:         0.80 cm      LV E/e' medial:  9.7 LV IVS:        0.75 cm      LV e' lateral:   10.20 cm/s LVOT diam:     2.30 cm      LV E/e' lateral: 6.3 LV SV:         71 LV SV Index:   37 LVOT Area:     4.15 cm  LV Volumes (MOD) LV vol d, MOD A2C: 101.0 ml LV vol d, MOD A4C: 89.2 ml LV vol s, MOD A2C: 46.3 ml LV vol s, MOD A4C: 29.4 ml LV SV MOD A2C:     54.7 ml LV SV MOD A4C:     89.2 ml LV SV MOD BP:      56.8 ml RIGHT VENTRICLE RV S prime:     9.90 cm/s TAPSE (M-mode): 2.1 cm LEFT ATRIUM           Index       RIGHT ATRIUM           Index LA Vol (A4C): 18.8 ml 9.87 ml/m  RA Area:     10.70 cm  RA Volume:   22.90 ml  12.03 ml/m  AORTIC VALVE LVOT Vmax:   75.10 cm/s LVOT Vmean:  55.300 cm/s LVOT VTI:    0.171 m  AORTA Ao Root diam: 3.60 cm MITRAL VALVE               TRICUSPID VALVE MV Area (PHT): 3.17 cm    TR Peak grad:   2.1 mmHg MV Decel Time: 239 msec    TR Vmax:        71.90 cm/s MV E velocity: 64.70 cm/s MV A velocity: 54.80 cm/s  SHUNTS MV E/A ratio:  1.18        Systemic VTI:  0.17 m                            Systemic Diam: 2.30 cm Dorothye Gathers MD Electronically signed by Dorothye Gathers MD Signature Date/Time: 10/26/2023/5:12:03 PM    Final    CT Angio Chest Pulmonary Embolism (PE) W or WO Contrast Result Date: 10/26/2023 CLINICAL DATA:  Evaluate for acute pulmonary embolism. High probability. Chest pain. EXAM: CT ANGIOGRAPHY CHEST WITH CONTRAST TECHNIQUE: Multidetector CT imaging of the chest was performed using the standard protocol during bolus administration of intravenous contrast. Multiplanar  CT image reconstructions and MIPs were obtained to evaluate the vascular anatomy. RADIATION DOSE REDUCTION: This exam was performed according to the departmental dose-optimization program which includes automated exposure control, adjustment of the mA and/or kV according to patient size and/or use of iterative reconstruction technique. CONTRAST:  75mL OMNIPAQUE  IOHEXOL  350 MG/ML SOLN COMPARISON:  None Available. FINDINGS: Cardiovascular: Satisfactory opacification of the pulmonary arteries to the segmental level. No evidence of pulmonary embolism. Normal heart size. No pericardial effusion. Aortic atherosclerosis. Coronary artery calcifications. Mediastinum/Nodes: Thyroid  gland and trachea appear normal. Circumferential wall thickening involving the distal esophagus noted. No mediastinal or hilar adenopathy. Lungs/Pleura: Emphysema with diffuse bronchial wall thickening. Mild interlobular septal thickening noted within the lower lung zones. Dependent atelectasis noted within the lung bases peripherally. No pleural effusion, pneumothorax or airspace consolidation. Perifissural nodule along the minor fissure measures 4 mm, image 88/6. Upper Abdomen: No acute abnormality. Gallstone is identified measuring 2.9 cm, image 175/5. Musculoskeletal: No chest wall abnormality. No acute or significant osseous findings. Review of the MIP images confirms the above findings. IMPRESSION: 1. No evidence for acute pulmonary embolus. 2. Mild interlobular septal thickening within the lower lung zones may reflect mild pulmonary edema. 3. Circumferential wall thickening involving the distal esophagus. Correlate for any clinical signs or symptoms of esophagitis. 4. Cholelithiasis. 5. Coronary artery calcifications. 6. Aortic Atherosclerosis (ICD10-I70.0) and Emphysema (ICD10-J43.9). Electronically Signed   By: Kimberley Penman M.D.   On: 10/26/2023 06:12   DG Chest 2 View Result Date: 10/25/2023 CLINICAL DATA:  CP EXAM: CHEST - 2 VIEW  COMPARISON:  None available. FINDINGS: No focal airspace consolidation, pleural effusion, or pneumothorax. No cardiomegaly. No acute fracture or destructive lesion. Multilevel thoracic osteophytosis. IMPRESSION: No acute cardiopulmonary abnormality. Electronically Signed   By: Rance Burrows M.D.   On: 10/25/2023 17:56     Time coordinating discharge: Over 30 minutes    Faith Homes, MD  [facility] 10/28/2023, 3:48 PM

## 2023-10-28 NOTE — Progress Notes (Signed)
 Rounding Note    Patient Name: Nancy [name] Date of Encounter: 10/28/2023  [facility] Cardiologist: None  Subjective   No chest pain. Only c/o back pain.   Inpatient Medications    Scheduled Meds:  aspirin  EC  81 mg Oral Daily   atorvastatin   40 mg Oral Daily   enoxaparin  (LOVENOX ) injection  40 mg Subcutaneous Daily   escitalopram   10 mg Oral Daily   feeding supplement (GLUCERNA SHAKE)  237 mL Oral TID BM   folic acid   1 mg Oral Daily   insulin  aspart  0-15 Units Subcutaneous TID WC   insulin  aspart  0-5 Units Subcutaneous QHS   insulin  glargine-yfgn  20 Units Subcutaneous Daily   multivitamin with minerals  1 tablet Oral Daily   nicotine   21 mg Transdermal Daily   thiamine   100 mg Oral Daily   Or   thiamine   100 mg Intravenous Daily   Continuous Infusions:  PRN Meds: acetaminophen , albuterol , cyclobenzaprine , dextrose , iohexol    Vital Signs    Vitals:   10/27/23 1631 10/27/23 2021 10/28/23 0456 10/28/23 0753  BP: 106/80 112/80 111/67 102/76  Pulse: 76 (!) 107 85 85  Resp: 17 20 19 20   Temp: 98.3 F (36.8 C) 98.2 F (36.8 C) 97.9 F (36.6 C) 98 F (36.7 C)  TempSrc: Oral Oral Oral Oral  SpO2: 96% 100% 94% 97%  Weight:      Height:       No intake or output data in the 24 hours ending 10/28/23 1006     10/25/2023    5:03 PM 02/19/2019    9:41 AM 02/08/2019    7:02 PM  Last 3 Weights  Weight (lbs) 165 lb 165 lb 165 lb  Weight (kg) 74.844 kg 74.844 kg 74.844 kg      Telemetry    sinus - Personally Reviewed  ECG    No am ekg - Personally Reviewed  Physical Exam   General: Well developed, well nourished, NAD  HEENT: OP clear Neuro: No focal deficits  Musculoskeletal: Muscle strength 5/5 all ext  Psychiatric: Mood and affect normal  Neck: No JVD Lungs:Clear bilaterally, no wheezes, rhonci, crackles Cardiovascular: Regular rate and rhythm. No murmurs, gallops or rubs. Abdomen:Soft.  Extremities: No lower extremity edema.     Labs    High Sensitivity Troponin:   Recent Labs  Lab 10/25/23 1738 10/25/23 2000  TROPONINIHS 6 6     Chemistry Recent Labs  Lab 10/26/23 0614 10/26/23 2057 10/26/23 2234 10/27/23 2314 10/28/23 0126  NA 135  --  132*  --  136  K 3.7  --  3.7  --  4.1  CL 100  --  95*  --  95*  CO2 25  --  28  --  33*  GLUCOSE 172*   < > 493* 476* 272*  BUN 17  --  12  --  18  CREATININE 0.47  --  0.69  --  0.62  CALCIUM  8.5*  --  8.8*  --  9.1  GFRNONAA >60  --  >60  --  >60  ANIONGAP 10  --  9  --  8   < > = values in this interval not displayed.    Lipids No results for input(s): "CHOL", "TRIG", "HDL", "LABVLDL", "LDLCALC", "CHOLHDL" in the last 168 hours.  Hematology Recent Labs  Lab 10/25/23 1738 10/25/23 1853 10/26/23 0145  WBC 7.7  --  8.3  RBC 4.04  --  3.70*  HGB 12.9 12.6 11.7*  HCT 37.7 37.0 34.3*  MCV 93.3  --  92.7  MCH 31.9  --  31.6  MCHC 34.2  --  34.1  RDW 11.8  --  11.8  PLT 238  --  204   Thyroid   Recent Labs  Lab 10/25/23 1735  TSH 1.671    BNP Recent Labs  Lab 10/25/23 1735  BNP 27.0    DDimer No results for input(s): "DDIMER" in the last 168 hours.   Radiology    CT CORONARY MORPH W/CTA COR W/SCORE W/CA W/CM &/OR WO/CM Addendum Date: 10/26/2023 ADDENDUM REPORT: 10/26/2023 21:36 CLINICAL DATA:  This is a 58 year old female with anginal symptoms EXAM: Cardiac/Coronary  CTA TECHNIQUE: The patient was scanned on a [employer]. FINDINGS: A 100 kV prospective scan was triggered in the descending thoracic aorta at 111 HU's. Axial non-contrast 3 mm slices were carried out through the heart. The data set was analyzed on a dedicated work station and scored using the Agatson method. Gantry rotation speed was 250 msecs and collimation was .6 mm. No beta blockade and 0.8 mg of sl NTG was given. The 3D data set was reconstructed in 5% intervals of the 67-82 % of the R-R cycle. Diastolic phases were analyzed on a dedicated work station using MPR,  MIP and VRT modes. The patient received 80 cc of contrast. Image Quality: Fair with misregistration artifact. Aorta: Normal 28.8 mm size.  No calcifications.  No dissection. Aortic Valve:  Trileaflet.  No calcifications. Coronary Arteries:  Normal coronary origin.  Right Dominance. RCA is a large dominant artery that gives rise to PDA and PLA. The proximal RCA with no plaques. The proximal to mid RCA has multiple plaques: The first plaque is mild focal calcification. The right beneath it lies two separate moderate (50 to 69%) calcified plaques. Stairstep artifact noted in the mid to distal RCA. In the distal RCA there is no apparent plaque. The PLA and PDA does not have any plaques. Left main is a large artery that gives rise to LAD and LCX arteries. LAD is a large vessel. The proximal LAD with mild focal calcified plaque. At the proximal to mid LAD there is a minimal (<24%) focal calcified plaque. The mid to distal LAD with no plaques. The D1 vessel is a medium size vessel with minimal focal mixed plaque in the proximal portion of the vessel. D2 is a small caliber vessel minimal focal calcified plaque in the midportion of the vessel. LCX is a non-dominant artery that gives rise to one large OM1 branch. The left circumflex artery with a mild focal calcification in the proximal portion of the vessel. In the proximal to mid vessel there are diffuse minimal calcifications. No plaques seen in the distal portion of the left circumflex. The OM1 vessel is a medium size vessel there are diffuse minimal calcification in the proximal to midportion of the vessel. Coronary Calcium  Score: Left main: 0 Left anterior descending artery: 86.9 Left circumflex artery: 72.7 Right coronary artery: 320 Total: 488 Percentile: 99 Other findings: Normal pulmonary vein drainage into the left atrium. Normal left atrial appendage without a thrombus. Normal size of the pulmonary artery. IMPRESSION: 1. Coronary calcium  score of 488. This was 66  percentile for age and sex matched control. 2. Normal coronary origin with right dominance. 3. CAD-RADS 3. Moderate stenosis. Consider symptom-guided anti-ischemic pharmacotherapy as well as risk factor modification per guideline directed care. Additional analysis with CT FFR  will be submitted The noncardiac portion of this study will be interpreted in separate report by the radiologist. Electronically Signed   By: Kardie  Tobb D.O.   On: 10/26/2023 21:36   Result Date: 10/26/2023 EXAM: OVER-READ INTERPRETATION  CT CHEST The following report is a limited chest CT over-read performed by radiologist Dr. Tyron Gallon of [facility] on 10/26/2023. This over-read does not include interpretation of cardiac or coronary anatomy or pathology. The coronary CTA interpretation by the cardiologist is attached. COMPARISON:  None Available. FINDINGS: There is a small hiatal hernia. Otherwise, visualized upper abdomen, lower lungs and lower mediastinum are within normal limits. No significant osseous abnormality. IMPRESSION: Small hiatal hernia. Electronically Signed: By: Tyron Gallon M.D. On: 10/26/2023 15:53   ECHOCARDIOGRAM COMPLETE Result Date: 10/26/2023    ECHOCARDIOGRAM REPORT   Patient Name:   JOSEPHENE PLAUTZ Date of Exam: 10/26/2023 Medical Rec #:  [id number]       Height:       69.0 in Accession #:    [id number]      Weight:       165.0 lb Date of Birth:  06/25/1965        BSA:          1.904 m Patient Age:    57 years        BP:           107/72 mmHg Patient Gender: F               HR:           72 bpm. Exam Location:  Inpatient Procedure: 2D Echo, Color Doppler and Cardiac Doppler (Both Spectral and Color            Flow Doppler were utilized during procedure). Indications:    R07.9* Chest pain, unspecified  History:        Patient has no prior history of Echocardiogram examinations.                 Risk Factors:Diabetes, Dyslipidemia and Hypertension.  Sonographer:    Sherline Distel Senior RDCS Referring Phys: [id number]  ARSHAD N KAKRAKANDY IMPRESSIONS  1. Left ventricular ejection fraction, by estimation, is 55 to 60%. The left ventricle has normal function. The left ventricle has no regional wall motion abnormalities. Left ventricular diastolic parameters were normal.  2. Right ventricular systolic function is normal. The right ventricular size is normal.  3. The mitral valve is normal in structure. No evidence of mitral valve regurgitation. No evidence of mitral stenosis.  4. The aortic valve is normal in structure. Aortic valve regurgitation is not visualized. No aortic stenosis is present.  5. The inferior vena cava is normal in size with greater than 50% respiratory variability, suggesting right atrial pressure of 3 mmHg. FINDINGS  Left Ventricle: Left ventricular ejection fraction, by estimation, is 55 to 60%. The left ventricle has normal function. The left ventricle has no regional wall motion abnormalities. The left ventricular internal cavity size was normal in size. There is  no left ventricular hypertrophy. Left ventricular diastolic parameters were normal. Right Ventricle: The right ventricular size is normal. No increase in right ventricular wall thickness. Right ventricular systolic function is normal. Left Atrium: Left atrial size was normal in size. Right Atrium: Right atrial size was normal in size. Pericardium: There is no evidence of pericardial effusion. Mitral Valve: The mitral valve is normal in structure. No evidence of mitral valve regurgitation. No evidence of mitral valve stenosis. Tricuspid  Valve: The tricuspid valve is normal in structure. Tricuspid valve regurgitation is not demonstrated. No evidence of tricuspid stenosis. Aortic Valve: The aortic valve is normal in structure. Aortic valve regurgitation is not visualized. No aortic stenosis is present. Pulmonic Valve: The pulmonic valve was normal in structure. Pulmonic valve regurgitation is not visualized. No evidence of pulmonic stenosis. Aorta: The  aortic root is normal in size and structure. Venous: The inferior vena cava is normal in size with greater than 50% respiratory variability, suggesting right atrial pressure of 3 mmHg. IAS/Shunts: No atrial level shunt detected by color flow Doppler.  LEFT VENTRICLE PLAX 2D LVIDd:         3.60 cm      Diastology LVIDs:         3.10 cm      LV e' medial:    6.64 cm/s LV PW:         0.80 cm      LV E/e' medial:  9.7 LV IVS:        0.75 cm      LV e' lateral:   10.20 cm/s LVOT diam:     2.30 cm      LV E/e' lateral: 6.3 LV SV:         71 LV SV Index:   37 LVOT Area:     4.15 cm  LV Volumes (MOD) LV vol d, MOD A2C: 101.0 ml LV vol d, MOD A4C: 89.2 ml LV vol s, MOD A2C: 46.3 ml LV vol s, MOD A4C: 29.4 ml LV SV MOD A2C:     54.7 ml LV SV MOD A4C:     89.2 ml LV SV MOD BP:      56.8 ml RIGHT VENTRICLE RV S prime:     9.90 cm/s TAPSE (M-mode): 2.1 cm LEFT ATRIUM           Index       RIGHT ATRIUM           Index LA Vol (A4C): 18.8 ml 9.87 ml/m  RA Area:     10.70 cm                                   RA Volume:   22.90 ml  12.03 ml/m  AORTIC VALVE LVOT Vmax:   75.10 cm/s LVOT Vmean:  55.300 cm/s LVOT VTI:    0.171 m  AORTA Ao Root diam: 3.60 cm MITRAL VALVE               TRICUSPID VALVE MV Area (PHT): 3.17 cm    TR Peak grad:   2.1 mmHg MV Decel Time: 239 msec    TR Vmax:        71.90 cm/s MV E velocity: 64.70 cm/s MV A velocity: 54.80 cm/s  SHUNTS MV E/A ratio:  1.18        Systemic VTI:  0.17 m                            Systemic Diam: 2.30 cm Dorothye Gathers MD Electronically signed by Dorothye Gathers MD Signature Date/Time: 10/26/2023/5:12:03 PM    Final    Patient Profile     58 y.o. female with history of DM, etoh use, tobacco abuse admitted with chest pain and found to have DKA. Negative troponin and no ischemic EKG changes  Assessment &  Plan    Chest pain: Normal echo with normal LV function without wall motion abnormalities. Negative troponin. Coronary CTA 10/26/23 with moderate RCA stenosis that does not appear to  be flow limiting. Unable to process CT FFR on the RCA lesion. Mild disease in the LAD and Circumflex.  Continue ASA and statin.  OK to discharge home.  Smoking cessation  For questions or updates, please contact [facility] Please consult [email or web address] for contact info under     Signed, Antoinette Batman, MD  10/28/2023, 10:06 AM

## 2023-11-17 ENCOUNTER — Other Ambulatory Visit: Payer: Self-pay

## 2023-12-07 NOTE — Progress Notes (Addendum)
 Chief Complaint  Patient presents with  . Complete Physical Exam    Room 1 pt states she is here today to get her pull ups, diapers, pt states she also needs gloves, pt needs needle for insulin  pen, pt states she will be having bilateral hip replacement in future, pt states she is having muscle spams all over this is ongoing, no other complaints, pt hasn't had any medication since last month states she was in the hospital 2 months ago and was given medication then    HPI Subjective Patient ID: Nancy [name] is a 58 year old female who presents for Complete Physical Exam (Room 1 pt states she is here today to get her pull ups, diapers, pt states she also needs gloves, pt needs needle for insulin  pen, pt states she will be having bilateral hip replacement in future, pt states she is having muscle spams all over this is ongoing, no other complaints, pt hasn't had any medication since last month states she was in the hospital 2 months ago and was given medication then). Patient with medical conditions including DM II, tobacco use, alcohol use, depression, GERD presents to clinic for an acute visit for hospital follow up.  Patient presented to [facility] health ED on 10/25/2023 with complaints of chest pain x 2 days prior to her ER presentation. She states she had been off her medicines for approximately 1 year mostly due to financial constraints. She states that she does not pay for the cigarettes or alcohol that she uses. Her ER workup was notable for DKA with a glucose level of 604, anion gap of 14, beta hydroxybutyrate acid of 3.8, and urine had ketones.  She was started on insulin  drip and admitted for further chest pain workup as well and cardiology was consulted.  Patient's drug test was positive for cocaine at that time. Patient states she has continued to take her medications from the recent hospitalization, but she has not taken her Lantus  20 units since she was discharged from the hospital on 10/28/23 due to not having  insulin  needles. She states medication is at the pharmacy but she did not have transportation but she tries to go there today after this visit.  Her point-of-care random glucose level is 347.  Patient states she has been having good and bad days. She states she has been dealing with bladder incontinence since 2021, but she started having bowel incontinence about a month ago and her symptoms have been worsening. Patient also states it takes her so long to reach the bathroom due to hip pain requiring use of crutches. Patient requests to have placement to skilled nursing facility for more close observation and medication management to allow her to get her A1c under control  at 7.0 to be able to have her hip. Patient states hip pain has greatly affected her quality of life and ability to complete her ADLs and IADLs.  Patient also states she is about to lose her house in 1 month due to the person she was caring for is deceased. Patient states she has been using nicotine  patches but still smoking 1PPD. Patient admits feeling brain fog, but denies dizziness, chest pain, shortness of breath, wheezing, dyspnea, orthopnea, syncope, lower extremity edema, nausea, vomiting, overt GI bleeding or melanotic stools. Her PHQ-9 score is 24 and GAD 7 score is 21. She denies suicidal or homicidal ideations. Patient admits cocaine use 3-4 times a week and cannabis 1 gram daily to help with her pain. She states cannabis  has been effective for her pain. She states hip pain radiates to her lower back and down to her knees and sides of her thighs. Pain level 10/10, but improves down to 6/10 after cannabis use per patient report.    Health Maintenance Due  Topic Date Due  . Imm-Hepatitis B (1 of 3 - 19+ 3-dose series) Never done  . Imm-Pneumococcal 50+ (1 of 2 - PCV) Never done  . Imm-Zoster, Recombinant (1 of 2) Never done  . Imm-COVID-19 (1 - 2024-25 season) Never done  . Imm-DTaP/Tdap/Td (3 - Td or Tdap) 11/10/2023   Past  Medical History:  Diagnosis Date  . Anxiety state   . Depression   . DM (diabetes mellitus) (HCC-CMS)   . Essential hypertension    No past surgical history on file. Current Outpatient Medications  Medication Sig Dispense Refill  . ACCU-CHEK GUIDE TEST STRIPS strips Use 1 Each as directed 4 (four) times daily before meals and nightly.    . aspirin  81 mg DR tablet Take 81 mg by mouth once daily.    . atorvastatin  (LIPITOR) 40 mg tablet Take 40 mg by mouth nightly at bedtime.    . DULoxetine  (CYMBALTA ) 60 mg DR capsule Take 1 Capsule by mouth once daily for 90 days. 90 Capsule 0  . metFORMIN  (GLUCOPHAGE ) 1,000 mg tablet Take 1 Tablet by mouth 2 (two) times daily with a meal for 30 days. 60 Tablet 0  . nicotine  (NICODERM, STEP 1) 21 mg/24 hr patch Place 21 mg onto the skin once daily (every 24 hours).    . NOVOLOG  FLEXPEN U-100 INSULIN  100 unit/mL (3 mL) Inject 0-18 Units into the skin Inject 0-18 Units into the skin 4 (four) times daily - before meals and at bedtime. Check Blood Glucose (BG) and inject per scale: BG <150= 0 unit; BG 150-200= 3 unit; BG 201-250= 6 unit; BG 251-300= 9 unit; BG 301-350= 12 unit; BG 351-400= 15 unit; BG >400= 18 unit and Call Primary Care.    Instructions: Inject 0-18 Units into the skin 4 (four) times daily - before meals and at bedtime. Check Blood Glucose (BG) and inject per scale: BG <150= 0 unit; BG 150-200= 3 unit; BG 201-250= 6 unit; BG 251-300= 9 unit; BG 301-350= 12 unit; BG 351-400= 15 unit; BG >400= 18 unit and Call Primary Care.  .    . pen needle, diabetic (PEN NEEDLE) 31 gauge x 3/16 ndle 1 Each by NOT APPLICABLE route.    . insulin  glargine (LANTUS  U-100 INSULIN ) 100 unit/mL injection inject by subcutaneous route as per insulin  protocol (Patient not taking: Reported on 12/07/2023.)    . albuterol  HFA (VENTOLIN  HFA) 90 mcg/actuation inhaler inhale 1 - 2 puffs (90 - 180 mcg) by inhalation route every 6 hours as needed     No current  facility-administered medications for this visit.   No Known Allergies  No family history on file.  Social History   Tobacco Use  . Smoking status: Every Day    Types: Cigarettes    Passive exposure: Never  . Smokeless tobacco: Never  Vaping Use  . Vaping status: Never Used  Substance and Sexual Activity  . Alcohol use: Yes  . Drug use: Yes    Types: Marijuana, Cocaine    Comment: anything she can get her hands on.  . Sexual activity: Yes    Partners: Male   Social Drivers of Corporate investment banker Strain: Not on File (10/08/2021)   Financial Resource  Strain   . Financial Resource Strain: 0  Food Insecurity: Food Insecurity Present (10/26/2023)   Received from [employer] Insecurity   . Worried About Programme researcher, broadcasting/film/video in the Last Year: Often true   . Ran Out of Food in the Last Year: Often true  Transportation Needs: Unmet Transportation Needs (10/26/2023)   Received from [facility] - Transportation   . Lack of Transportation (Medical): Yes   . Lack of Transportation (Non-Medical): Yes  Physical Activity: Not on File (10/08/2021)   Physical Activity   . Physical Activity: 0  Stress: Not on File (10/08/2021)   Stress   . Stress: 0  Social Connections: Socially Isolated (10/26/2023)   Received from [facility]   Social Connection and Isolation Panel [NHANES]   . Frequency of Communication with Friends and Family: Once a week   . Frequency of Social Gatherings with Friends and Family: Never   . Attends Religious Services: Never   . Active Member of Clubs or Organizations: No   . Attends Banker Meetings: Never   . Marital Status: Separated  Housing Stability: High Risk (10/26/2023)   Received from [facility] Stability Vital Sign   . Unable to Pay for Housing in the Last Year: Yes   . Number of Times Moved in the Last Year: 0   . Homeless in the Last Year: Yes     Review of Systems  All other systems reviewed and are  negative.   Objective BP 98/59 (Left Arm, Sitting, Regular Adult)  Pulse 90  Temp 97.2 F (36.2 C)  Resp 20  Ht 5' 7 (1.702 m)  Wt 111 lb 6.4 oz (50.5 kg)  LMP  (LMP Unknown)  SpO2 96%  BMI 17.45 kg/m  OB Status Postmenopausal  Smoking Status Every Day  BSA 1.55 m  Results for orders placed or performed in visit on 12/07/23  URINE DIP (POCT) AUTOMATIC Urine Routine     Status: Abnormal   Specimen: Urine  Result Value Ref Range   URINE GLUCOSE 2+ (A) NEGATIVE mg/dL   URINE BILIRUBIN NEGATIVE NEGATIVE   URINE KETONES 5.0 (A) NEGATIVE mg/dL   URINE SPECIFIC GRAVITY 1.020 1.015 - >=1.030   URINE BLOOD TRACE-INTACT (A) NEGATIVE   URINE PH 5.5 5.0 - 8.5   PROTEIN, URINE NEGATIVE NEGATIVE mg/dL   URINE UROBILINOGEN 0.2 0.2 - 1.0 E.U./dL   URINE NITRITE NEGATIVE NEGATIVE   URINE LEUKOCYTES NEGATIVE NEGATIVE   CLARITY OF URINE CLEAR CLEAR   URINE COLOR YELLOW COLORLESS, LIGHT YELLOW, YELLOW, DARK YELLOW, STRAW, AMBER  URINE CULTURE, COMPREHENSIVE Urine Urine Routine     Status: Abnormal   Specimen: Urine  Result Value Ref Range   URINE CULTURE, COMPREHENSIVE Final report (A)    RESULT 1 Candida krusei (A)     Comment: 50,000-100,000 colony forming units per mL   RESULT 2      Comment: Mixed urogenital flora 25,000-50,000 colony forming units per mL   GLUCOSE BLOOD (POCT) Routine     Status: Abnormal  Result Value Ref Range   BLOOD GLUCOSE 347 (A) 30 - 70 mg/dL    Physical Exam Vitals and nursing note reviewed.   Constitutional:      General: She is not in acute distress. HENT:     Head: Normocephalic.     Right Ear: Tympanic membrane, ear canal and external ear normal. There is no impacted cerumen.  Left Ear: Tympanic membrane, ear canal and external ear normal. There is no impacted cerumen.     Nose: Nose normal. No congestion or rhinorrhea.     Mouth/Throat:     Mouth: Mucous membranes are moist.     Pharynx: Oropharynx is clear. No oropharyngeal exudate or  posterior oropharyngeal erythema.  Eyes:     General: No scleral icterus.    Conjunctiva/sclera: Conjunctivae normal.     Pupils: Pupils are equal, round, and reactive to light.  Neck:     Thyroid : No thyroid  mass, thyromegaly or thyroid  tenderness.   Cardiovascular:     Rate and Rhythm: Normal rate and regular rhythm.     Pulses: Normal pulses.     Heart sounds: Normal heart sounds.  Pulmonary:     Effort: Pulmonary effort is normal.     Breath sounds: Normal breath sounds.  Abdominal:     General: Bowel sounds are normal.     Palpations: Abdomen is soft.     Tenderness: There is no right CVA tenderness or left CVA tenderness.  Musculoskeletal:        General: Normal range of motion.     Cervical back: Normal range of motion.     Right lower leg: No edema.     Left lower leg: No edema.  Skin:    General: Skin is warm.     Capillary Refill: Capillary refill takes less than 2 seconds.  Neurological:     General: No focal deficit present.     Mental Status: She is alert and oriented to person, place, and time.  Psychiatric:        Mood and Affect: Mood normal.        Behavior: Behavior normal.        Thought Content: Thought content normal.        Judgment: Judgment normal.     Assessment & Plan -Patient requests referral for placement to skilled nursing facility for close supervision, medication management and assistance with ADLs due to severe hip pain affecting patient's quality of life and ability to care for herself. Currently ambulates with crutches and unable to undergo hip replacement due to uncontrolled diabetes mellitus type 2. Goal ofr A1c less than 7%, but currently >15.5%. - Patient plans to follow-up with her pharmacy to pick up supplies for insulin  injection. -Follow-up in 2 weeks to reevaluate. 1. Hospital discharge follow-up - URINE DIP (POCT) AUTOMATIC Urine Routine  2. Uncontrolled type 2 diabetes mellitus with hyperglycemia, without long-term current use  of insulin  (HCC-CMS) -Presents A1c 15.5% on 10/26/2023. -Reports noncompliance with her medication due to cost. - GLUCOSE BLOOD (POCT) Routine - REFERRAL FOR SKILLED NURSING - metFORMIN  (GLUCOPHAGE ) 1,000 mg tablet; Take 1 Tablet by mouth 2 (two) times daily with a meal for 30 days.  Dispense: 60 Tablet; Refill: 0   3. Hyperglycemia  - GLUCOSE BLOOD (POCT) Routine  4. Abnormal finding on urinalysis  - URINE CULTURE, COMPREHENSIVE Urine Urine Routine  5. Diabetic ketoacidosis without coma associated with type 2 diabetes mellitus (HCC-CMS)  - GLUCOSE BLOOD (POCT) Routine  6. Mixed incontinence -DME [facility] to [facility] for [facility]ence supplies. - REFERRAL FOR SKILLED NURSING  7. Cachexia (HCC-CMS)  - REFERRAL FOR SKILLED NURSING  8. Primary osteoarthritis involving multiple joints -Severe affecting bilateral hips. -Patient requires hip replacement surgery but procedures cannot be completed due to uncontrolled A1c. - REFERRAL FOR SKILLED NURSING  9. Bilateral hip pain  - REFERRAL FOR SKILLED NURSING  10. Generalized anxiety disorder; 11. Severe depression (HCC-CMS) -Her PHQ-9 score is 24 and GAD-7 score is 21.  She has no reports of suicidal or homicidal ideations. -Will increase her Cymbalta  dose to 60 mg p.o. daily. - SCREENING FOR DEPRESSION PERFORMED - SCREENING - GAD-7 - DULoxetine  (CYMBALTA ) 60 mg DR capsule; Take 1 Capsule by mouth once daily for 90 days.  Dispense: 90 Capsule; Refill: 0 -     REFERRAL TO PSYCHIATRY  12. Need for assistance with personal care  - REFERRAL FOR SKILLED NURSING

## 2023-12-08 ENCOUNTER — Encounter: Payer: Self-pay | Admitting: Podiatry

## 2023-12-08 ENCOUNTER — Ambulatory Visit (INDEPENDENT_AMBULATORY_CARE_PROVIDER_SITE_OTHER): Admitting: Podiatry

## 2023-12-08 ENCOUNTER — Other Ambulatory Visit: Payer: Self-pay

## 2023-12-08 DIAGNOSIS — M79674 Pain in right toe(s): Secondary | ICD-10-CM | POA: Diagnosis not present

## 2023-12-08 DIAGNOSIS — M79675 Pain in left toe(s): Secondary | ICD-10-CM

## 2023-12-08 DIAGNOSIS — B351 Tinea unguium: Secondary | ICD-10-CM | POA: Diagnosis not present

## 2023-12-08 DIAGNOSIS — E1140 Type 2 diabetes mellitus with diabetic neuropathy, unspecified: Secondary | ICD-10-CM

## 2023-12-08 NOTE — Progress Notes (Signed)
This patient presents to the office with chief complaint of long thick nails and diabetic feet.  This patient  says there  is  no pain and discomfort in her  feet.  This patient says there are long thick painful nails.  These nails are painful walking and wearing shoes.   Patient is unable to  self treat his own nails . This patient presents  to the office today for treatment of the  long nails   General Appearance  Alert, conversant and in no acute stress.  Vascular  Dorsalis pedis and posterior tibial  pulses are palpable  bilaterally.  Capillary return is within normal limits  bilaterally. Temperature is within normal limits  bilaterally.  Neurologic  Senn-Weinstein monofilament wire test within normal limits  bilaterally. Muscle power within normal limits bilaterally.  Nails Thick disfigured discolored nails with subungual debris  from hallux to fifth toes bilaterally. No evidence of bacterial infection or drainage bilaterally.  Orthopedic  No limitations of motion of motion feet .  No crepitus or effusions noted.  No bony pathology or digital deformities noted.  HAV  B/L.  Skin  normotropic skin with no porokeratosis noted bilaterally.  No signs of infections or ulcers noted.     Onychomycosis  Diabetes with no foot complications  Debride nails with nail nipper.     RTC 3  months.   Gardiner Barefoot DPM

## 2023-12-09 ENCOUNTER — Other Ambulatory Visit (HOSPITAL_BASED_OUTPATIENT_CLINIC_OR_DEPARTMENT_OTHER): Payer: Self-pay

## 2023-12-09 MED ORDER — GLIMEPIRIDE 2 MG PO TABS
2.0000 mg | ORAL_TABLET | Freq: Every day | ORAL | 3 refills | Status: DC
  Filled 2023-12-09: qty 90, 90d supply, fill #0

## 2023-12-09 MED ORDER — DULOXETINE HCL 60 MG PO CPEP
60.0000 mg | ORAL_CAPSULE | Freq: Every day | ORAL | 0 refills | Status: DC
  Filled 2023-12-09: qty 90, 90d supply, fill #0

## 2023-12-09 MED ORDER — METFORMIN HCL 1000 MG PO TABS
1000.0000 mg | ORAL_TABLET | Freq: Two times a day (BID) | ORAL | 0 refills | Status: DC
  Filled 2023-12-09: qty 60, 30d supply, fill #0

## 2023-12-13 ENCOUNTER — Other Ambulatory Visit (HOSPITAL_BASED_OUTPATIENT_CLINIC_OR_DEPARTMENT_OTHER): Payer: Self-pay

## 2023-12-19 ENCOUNTER — Other Ambulatory Visit (HOSPITAL_BASED_OUTPATIENT_CLINIC_OR_DEPARTMENT_OTHER): Payer: Self-pay

## 2024-01-04 ENCOUNTER — Emergency Department (HOSPITAL_COMMUNITY)

## 2024-01-04 ENCOUNTER — Other Ambulatory Visit (HOSPITAL_COMMUNITY): Payer: Self-pay

## 2024-01-04 ENCOUNTER — Emergency Department (HOSPITAL_COMMUNITY)
Admission: EM | Admit: 2024-01-04 | Discharge: 2024-01-04 | Disposition: A | Discharge status: Home / Self Care | Attending: Emergency Medicine | Admitting: Emergency Medicine

## 2024-01-04 ENCOUNTER — Other Ambulatory Visit: Payer: Self-pay

## 2024-01-04 ENCOUNTER — Encounter (HOSPITAL_COMMUNITY): Payer: Self-pay

## 2024-01-04 DIAGNOSIS — E1165 Type 2 diabetes mellitus with hyperglycemia: Secondary | ICD-10-CM | POA: Diagnosis not present

## 2024-01-04 DIAGNOSIS — Z794 Long term (current) use of insulin: Secondary | ICD-10-CM | POA: Diagnosis not present

## 2024-01-04 DIAGNOSIS — Y904 Blood alcohol level of 80-99 mg/100 ml: Secondary | ICD-10-CM | POA: Insufficient documentation

## 2024-01-04 DIAGNOSIS — Z7982 Long term (current) use of aspirin: Secondary | ICD-10-CM | POA: Insufficient documentation

## 2024-01-04 DIAGNOSIS — J45909 Unspecified asthma, uncomplicated: Secondary | ICD-10-CM | POA: Insufficient documentation

## 2024-01-04 DIAGNOSIS — Z7951 Long term (current) use of inhaled steroids: Secondary | ICD-10-CM | POA: Insufficient documentation

## 2024-01-04 DIAGNOSIS — F10129 Alcohol abuse with intoxication, unspecified: Secondary | ICD-10-CM | POA: Insufficient documentation

## 2024-01-04 DIAGNOSIS — W19XXXA Unspecified fall, initial encounter: Secondary | ICD-10-CM

## 2024-01-04 DIAGNOSIS — R0789 Other chest pain: Secondary | ICD-10-CM | POA: Diagnosis present

## 2024-01-04 DIAGNOSIS — M160 Bilateral primary osteoarthritis of hip: Secondary | ICD-10-CM | POA: Diagnosis not present

## 2024-01-04 DIAGNOSIS — S7000XA Contusion of unspecified hip, initial encounter: Secondary | ICD-10-CM

## 2024-01-04 LAB — COMPREHENSIVE METABOLIC PANEL WITH GFR
ALT: 32 U/L (ref 0–44)
AST: 19 U/L (ref 15–41)
Albumin: 3.4 g/dL — ABNORMAL LOW (ref 3.5–5.0)
Alkaline Phosphatase: 50 U/L (ref 38–126)
Anion gap: 14 (ref 5–15)
BUN: 16 mg/dL (ref 6–20)
CO2: 23 mmol/L (ref 22–32)
Calcium: 9.2 mg/dL (ref 8.9–10.3)
Chloride: 92 mmol/L — ABNORMAL LOW (ref 98–111)
Creatinine, Ser: 0.56 mg/dL (ref 0.44–1.00)
GFR, Estimated: >60 mL/min (ref >60)
Glucose, Bld: 468 mg/dL — ABNORMAL HIGH (ref 70–99)
Potassium: 3.3 mmol/L — ABNORMAL LOW (ref 3.5–5.1)
Sodium: 129 mmol/L — ABNORMAL LOW (ref 135–145)
Total Bilirubin: 0.4 mg/dL (ref 0.0–1.2)
Total Protein: 6.2 g/dL — ABNORMAL LOW (ref 6.5–8.1)

## 2024-01-04 LAB — CBC
HCT: 38.5 % (ref 36.0–46.0)
Hemoglobin: 13 g/dL (ref 12.0–15.0)
MCH: 32.2 pg (ref 26.0–34.0)
MCHC: 33.8 g/dL (ref 30.0–36.0)
MCV: 95.3 fL (ref 80.0–100.0)
Platelets: 220 K/uL (ref 150–400)
RBC: 4.04 MIL/uL (ref 3.87–5.11)
RDW: 11.6 % (ref 11.5–15.5)
WBC: 14.1 K/uL — ABNORMAL HIGH (ref 4.0–10.5)
nRBC: 0 % (ref 0.0–0.2)

## 2024-01-04 LAB — TROPONIN I (HIGH SENSITIVITY)
Troponin I (High Sensitivity): 5 ng/L (ref <18)
Troponin I (High Sensitivity): 5 ng/L (ref <18)

## 2024-01-04 LAB — CBG MONITORING, ED: Glucose-Capillary: 354 mg/dL — ABNORMAL HIGH (ref 70–99)

## 2024-01-04 LAB — ETHANOL: Alcohol, Ethyl (B): 91 mg/dL — ABNORMAL HIGH (ref <15)

## 2024-01-04 MED ORDER — INSULIN ASPART 100 UNIT/ML IJ SOLN
10.0000 [IU] | Freq: Once | INTRAMUSCULAR | Status: AC
  Administered 2024-01-04: 10 [IU] via INTRAVENOUS

## 2024-01-04 MED ORDER — ACCU-CHEK SOFTCLIX LANCETS MISC
12 refills | Status: AC
  Filled 2024-01-04: qty 100, 30d supply, fill #0

## 2024-01-04 MED ORDER — SODIUM CHLORIDE 0.9 % IV BOLUS
500.0000 mL | Freq: Once | INTRAVENOUS | Status: AC
  Administered 2024-01-04: 500 mL via INTRAVENOUS

## 2024-01-04 MED ORDER — INSULIN ASPART 100 UNIT/ML FLEXPEN
0.0000 [IU] | PEN_INJECTOR | Freq: Three times a day (TID) | SUBCUTANEOUS | 0 refills | Status: DC
  Filled 2024-01-04: qty 30, 42d supply, fill #0

## 2024-01-04 MED ORDER — BLOOD GLUCOSE TEST VI STRP
1.0000 | ORAL_STRIP | Freq: Three times a day (TID) | 3 refills | Status: AC
  Filled 2024-01-04: qty 100, 25d supply, fill #0

## 2024-01-04 MED ORDER — METFORMIN HCL 1000 MG PO TABS
1000.0000 mg | ORAL_TABLET | Freq: Two times a day (BID) | ORAL | 0 refills | Status: DC
  Filled 2024-01-04: qty 60, 30d supply, fill #0

## 2024-01-04 MED ORDER — GLIMEPIRIDE 2 MG PO TABS
2.0000 mg | ORAL_TABLET | Freq: Every day | ORAL | 0 refills | Status: DC
  Filled 2024-01-04: qty 30, 30d supply, fill #0

## 2024-01-04 MED ORDER — INSULIN GLARGINE 100 UNIT/ML SOLOSTAR PEN
20.0000 [IU] | PEN_INJECTOR | Freq: Every day | SUBCUTANEOUS | 0 refills | Status: DC
Start: 2024-01-04 — End: 2024-02-02
  Filled 2024-01-04: qty 6, 30d supply, fill #0

## 2024-01-04 MED ORDER — ACETAMINOPHEN 325 MG PO TABS: 650.0000 mg | ORAL_TABLET | Freq: Four times a day (QID) | ORAL | Status: DC | PRN

## 2024-01-04 NOTE — ED Provider Notes (Signed)
 Patient is a 58 year old female with multiple medical problems including daily alcohol use with recurrent falls known osteoarthritis in her hips bilaterally and poorly controlled diabetes who is presenting today due to multiple complaints.  Patient is having complaints of bilateral hip pain right worse than left but states she was unable to walk after the fall today where she reports she tripped and fell.  She denies any head injury or loss of consciousness.  She typically drinks beer every day.  Currently patient smells of alcohol.  She is also complaining of some pleuritic chest pain but low suspicion for ACS, PE.  Concern for possible rib fractures versus musculoskeletal pain.  Independently interpreted patient's EKG which shows normal sinus rhythm with a prolonged QT but no other acute findings.  EtOH is 91 but patient is not clinically intoxicated at this time.  CMP with hyponatremia which is most likely related to elevated blood sugars and possible mild dehydration.  Patient's initial blood pressure was in the 90s systolic but improved with IV fluids.  Troponin and hemoglobin are within normal limits.  I have independently visualized and interpreted pt's images today.  Pelvic film shows significant arthritis of the right and may just be positioning but looks abnormal.  Patient is not able to walk so we will do a CT to ensure no occult fractures of the pelvis.  No evidence of femur fracture at this time.  X-ray without evidence of rib fractures.  Patient's saturating 96% on room air with no difficulty breathing. CT without occult fx.  Discussed with TOC to try and get pt her insulin  as she reports she cannot pick it up.  She does have a walker at home but doesn't use it.    Doretha Folks, MD 01/04/24 [phone]

## 2024-01-04 NOTE — ED Notes (Signed)
 Patient ambulated to restroom with her home crutches and stand by assist.

## 2024-01-04 NOTE — ED Provider Notes (Incomplete Revision)
 Patient is a 58 year old female with multiple medical problems including daily alcohol use with recurrent falls known osteoarthritis in her hips bilaterally and poorly controlled diabetes who is presenting today due to multiple complaints.  Patient is having complaints of bilateral hip pain right worse than left but states she was unable to walk after the fall today where she reports she tripped and fell.  She denies any head injury or loss of consciousness.  She typically drinks beer every day.  Currently patient smells of alcohol.  She is also complaining of some pleuritic chest pain but low suspicion for ACS, PE.  Concern for possible rib fractures versus musculoskeletal pain.  Independently interpreted patient's EKG which shows normal sinus rhythm with a prolonged QT but no other acute findings.  EtOH is 91 but patient is not clinically intoxicated at this time.  CMP with hyponatremia which is most likely related to elevated blood sugars and possible mild dehydration.  Patient's initial blood pressure was in the 90s systolic but improved with IV fluids.  Troponin and hemoglobin are within normal limits.  I have independently visualized and interpreted pt's images today.  Pelvic film shows significant arthritis of the right and may just be positioning but looks abnormal.  Patient is not able to walk so we will do a CT to ensure no occult fractures of the pelvis.  No evidence of femur fracture at this time.  X-ray without evidence of rib fractures.  Patient's saturating 96% on room air with no difficulty breathing. CT without occult fx.  Discussed with TOC to try and get pt her insulin  as she reports she cannot pick it up.  She does have a walker at home but doesn't use it.    Doretha Folks, MD 01/04/24 [phone]

## 2024-01-04 NOTE — ED Provider Notes (Signed)
 [facility] AT [facility] Provider Note   CSN: [id number] Arrival date & time: 01/04/24  9385     Patient presents with: Chest Pain   CONSTANTINA LASETER is a 58 y.o. female with a past medical history of asthma, uncontrolled T2DM, and polysubstance use presents to the ED for chest pain she has had for the last two days. She states it is a aching pain on the left side of her chest that radiates underneath her breast to her back. It is reproducible on palpation. She states she has had this pain before about a month ago, but elected to ignore it at that time. Additionally, she was at home in her yard when she lost her balance and fell onto the ground. She denies hitting her head or losing consciousness. She states she needs both her knees replaced, which contributes to her trouble with balance. She has some associated hip pain from her fall on both sides. She endorses a headache, some SOB, and some leg swelling, but denies nausea, vomiting, or abdominal pain. History is limited due to patient's participation.      Prior to Admission medications   Medication Sig Start Date End Date Taking? Authorizing Provider  albuterol  (VENTOLIN  HFA) 108 (90 Base) MCG/ACT inhaler Inhale 1-2 puffs into the lungs every 4 (four) hours as needed for wheezing or shortness of breath. 10/27/23   Patsy Lenis, MD  aspirin  EC 81 MG tablet Take 1 tablet (81 mg total) by mouth daily. Swallow whole. 10/28/23   Patsy Lenis, MD  atorvastatin  (LIPITOR) 40 MG tablet Take 1 tablet (40 mg total) by mouth daily. 10/28/23   Patsy Lenis, MD  Blood Glucose Monitoring Suppl (BLOOD GLUCOSE MONITOR SYSTEM) w/Device KIT Use as directed 10/27/23   Patsy Lenis, MD  DULoxetine  (CYMBALTA ) 60 MG capsule Take 1 capsule (60 mg total) by mouth daily. 12/09/23     escitalopram  (LEXAPRO ) 10 MG tablet Take 1 tablet (10 mg total) by mouth daily. 10/28/23   Patsy Lenis, MD  glimepiride  (AMARYL ) 2 MG tablet Take 1 tablet  (2 mg total) by mouth daily with breakfast. 12/09/23     Glucose Blood (BLOOD GLUCOSE TEST STRIPS) STRP Use to check blood sugar 4 (four) times daily -  before meals and at bedtime. 10/27/23   Patsy Lenis, MD  insulin  aspart (NOVOLOG ) 100 UNIT/ML FlexPen Inject 0-18 Units into the skin 4 (four) times daily -  before meals and at bedtime. Check Blood Glucose (BG) and inject per scale: BG <150= 0 unit; BG 150-200= 3 unit; BG 201-250= 6 unit; BG 251-300= 9 unit; BG 301-350= 12 unit; BG 351-400= 15 unit; BG >400= 18 unit and Call Primary Care. 10/27/23   Patsy Lenis, MD  insulin  glargine (LANTUS ) 100 UNIT/ML Solostar Pen Inject 20 Units into the skin daily. 10/27/23   Patsy Lenis, MD  Insulin  Pen Needle (PEN NEEDLES) 31G X 5 MM MISC Use with insulin  4 (four) times daily -  before meals and at bedtime. 10/27/23   Patsy Lenis, MD  Lancet Device MISC Use (three) times daily as directed 10/27/23   Patsy Lenis, MD  Lancets MISC Use as directed to check blood sugar 4 (four) times daily -  before meals and at bedtime. 10/27/23   Patsy Lenis, MD  metFORMIN  (GLUCOPHAGE ) 1000 MG tablet Take 1 tablet (1,000 mg total) by mouth 2 (two) times daily with a meal. 12/09/23     nicotine  (NICODERM CQ  - DOSED IN MG/24 HOURS) 21  mg/24hr patch Place 1 patch (21 mg total) onto the skin daily. 10/29/23   Patsy Lenis, MD    Allergies: No known allergies    Review of Systems  Cardiovascular:  Positive for chest pain and leg swelling.    Updated Vital Signs BP (!) 92/57   Pulse 85   Temp 98.3 F (36.8 C) (Oral)   Resp 18   SpO2 99%   Physical Exam HENT:     Head: Normocephalic and atraumatic.  Cardiovascular:     Rate and Rhythm: Normal rate and regular rhythm.     Pulses:          Radial pulses are 2+ on the right side and 2+ on the left side.       Dorsalis pedis pulses are 2+ on the right side and 2+ on the left side.       Posterior tibial pulses are 2+ on the right side and 2+ on the left side.      Heart sounds: Normal heart sounds.  Pulmonary:     Effort: Pulmonary effort is normal.     Breath sounds: Normal breath sounds.  Chest:     Chest wall: Tenderness present.  Skin:    General: Skin is warm and dry.  Neurological:     General: No focal deficit present.  Psychiatric:        Mood and Affect: Mood normal.     (all labs ordered are listed, but only abnormal results are displayed) Labs Reviewed  CBC - Abnormal; Notable for the following components:      Result Value   WBC 14.1 (*)    All other components within normal limits  ETHANOL - Abnormal; Notable for the following components:   Alcohol, Ethyl (B) 91 (*)    All other components within normal limits  COMPREHENSIVE METABOLIC PANEL WITH GFR  TROPONIN I (HIGH SENSITIVITY)    EKG: None  Radiology: DG Chest 2 View Result Date: 01/04/2024 CLINICAL DATA:  Chest pain EXAM: CHEST - 2 VIEW COMPARISON:  10/25/2023 FINDINGS: The heart size and mediastinal contours are within normal limits. Both lungs are clear. The visualized skeletal structures are unremarkable. IMPRESSION: No acute abnormality of the lungs in AP portable projection. Electronically Signed   By: Marolyn JONETTA Jaksch M.D.   On: 01/04/2024 07:11    {Document cardiac monitor, telemetry assessment procedure when appropriate:32947} Procedures   Medications Ordered in the ED  sodium chloride  0.9 % bolus 500 mL (has no administration in time range)  acetaminophen  (TYLENOL ) tablet 650 mg (has no administration in time range)      {Click here for ABCD2, HEART and other calculators REFRESH Note before signing:1}                              Medical Decision Making Patient presenting to ED after mechanical fall. Reports sustained chest pain x1 month. Differential diagnosis includes ACS vs PE vs rib fracture vs MSK pain. EKG ordered showing normal sinus rhythm with a prolonged Qtc of 508. BP soft at 92/57. Will give 500mL of NS. CXR ordered and normal. Troponins  ordered and pending. Ethanol level elevated at 91, however not acutely intoxicated. Will order pelvis XR and Tylenol  650mg  for pain sustained after her fall.   BP improved to 97/72 with 1L NS. Troponins negative at 5. Sodium 129, likely due to chronic alcohol use and elevated blood sugar. Glucose 498. Gave 10u of  aspart.   XR pelvis shows abnormal pelvis remodeling due to osteoarthritis. CT Pelvis wo contrast ordered to r/o occult fracture. Pending CT results, will consider admission vs discharge.  Repeat BG shows improvement to 354. CT Pelvis shows severe bilateral hip joint degenerative changes. No acute or occult fractures. When discussing with patient, she is concerned about getting insulin  as she cannot pickup a prescription outside of the hospital. Discussed mobility at home. She states she has a walker but prefers to use her crutches instead. TOC consult placed. Discussed safety and stability of movement with a walker instead of her crutches, especially with balance concerns.  Patient able to ambulate slowly, will discharge to home.   Amount and/or Complexity of Data Reviewed Labs: ordered. Radiology: ordered.  Risk OTC drugs. Prescription drug management.    {Document critical care time when appropriate  Document review of labs and clinical decision tools ie CHADS2VASC2, etc  Document your independent review of radiology images and any outside records  Document your discussion with family members, caretakers and with consultants  Document social determinants of health affecting pt's care  Document your decision making why or why not admission, treatments were needed:32947:::1}   Final diagnoses:  None    ED Discharge Orders     None

## 2024-01-04 NOTE — Discharge Planning (Signed)
 RNCM consulted in regards to medication assistance.  Pt has Medicaid insurance coverage and is not eligible for Medication Assistance Through [employer] [facility]) program. RNCM suggests sending Rx to [facility]  [facility]  to fill and bring to pt at bedside prior to discharge. No further CM needs communicated at this time.   Jahmai Finelli J. Debarah, BSN, RN, [facility]  IP Care Management  Nurse Case Manager  [facility] Emergency Departments  Operative Services  [phone]

## 2024-01-04 NOTE — ED Triage Notes (Signed)
 Pt presents via EMS c/o left breast chest pain that started a few days ago. Reports fall tonight, c/o chronic hip pain, reports needs hip replacement. Also reports non-compliance with insulin . EMS reports CBG in 500s. Denies LOC upon falling. No blood thinners. 324 ASA, 4mg  IV Zofran , and 500cc bolus given by EMS.   A&O x4.

## 2024-01-04 NOTE — Discharge Instructions (Addendum)
 Both Xray and CT scan of your pelvis show no signs of any fracture. Your blood sugar on arrival was significantly elevated. Insulin  in the ED was able to improve your sugar. We will send you home with some insulin  and other your other diabetes medicines to keep your blood sugar in a normal range. It is important to follow up with your PCP to monitor your diabetes. Please return to the ED if you fall and suspect a break, or if you lose consciousness when falling.

## 2024-01-24 ENCOUNTER — Inpatient Hospital Stay (HOSPITAL_COMMUNITY)
Admission: RE | Admit: 2024-01-24 | Discharge: 2024-02-16 | DRG: 689 | Disposition: A | Discharge status: Skilled Nursing Facility | Attending: Family Medicine | Admitting: Family Medicine

## 2024-01-24 ENCOUNTER — Encounter (HOSPITAL_COMMUNITY): Payer: Self-pay | Admitting: Emergency Medicine

## 2024-01-24 ENCOUNTER — Other Ambulatory Visit: Payer: Self-pay

## 2024-01-24 ENCOUNTER — Emergency Department (HOSPITAL_COMMUNITY)

## 2024-01-24 DIAGNOSIS — R059 Cough, unspecified: Secondary | ICD-10-CM

## 2024-01-24 DIAGNOSIS — W010XXA Fall on same level from slipping, tripping and stumbling without subsequent striking against object, initial encounter: Secondary | ICD-10-CM | POA: Diagnosis present

## 2024-01-24 DIAGNOSIS — R197 Diarrhea, unspecified: Secondary | ICD-10-CM | POA: Diagnosis present

## 2024-01-24 DIAGNOSIS — F5102 Adjustment insomnia: Secondary | ICD-10-CM | POA: Insufficient documentation

## 2024-01-24 DIAGNOSIS — Z681 Body mass index (BMI) 19 or less, adult: Secondary | ICD-10-CM

## 2024-01-24 DIAGNOSIS — G4700 Insomnia, unspecified: Secondary | ICD-10-CM | POA: Diagnosis present

## 2024-01-24 DIAGNOSIS — Z96643 Presence of artificial hip joint, bilateral: Secondary | ICD-10-CM | POA: Diagnosis present

## 2024-01-24 DIAGNOSIS — M545 Low back pain, unspecified: Secondary | ICD-10-CM | POA: Diagnosis present

## 2024-01-24 DIAGNOSIS — R739 Hyperglycemia, unspecified: Secondary | ICD-10-CM

## 2024-01-24 DIAGNOSIS — Z79899 Other long term (current) drug therapy: Secondary | ICD-10-CM

## 2024-01-24 DIAGNOSIS — R296 Repeated falls: Secondary | ICD-10-CM | POA: Diagnosis present

## 2024-01-24 DIAGNOSIS — Z794 Long term (current) use of insulin: Secondary | ICD-10-CM | POA: Diagnosis not present

## 2024-01-24 DIAGNOSIS — R0600 Dyspnea, unspecified: Secondary | ICD-10-CM | POA: Insufficient documentation

## 2024-01-24 DIAGNOSIS — E1140 Type 2 diabetes mellitus with diabetic neuropathy, unspecified: Secondary | ICD-10-CM | POA: Diagnosis present

## 2024-01-24 DIAGNOSIS — M879 Osteonecrosis, unspecified: Secondary | ICD-10-CM | POA: Diagnosis present

## 2024-01-24 DIAGNOSIS — L8990 Pressure ulcer of unspecified site, unspecified stage: Secondary | ICD-10-CM | POA: Diagnosis present

## 2024-01-24 DIAGNOSIS — F1010 Alcohol abuse, uncomplicated: Secondary | ICD-10-CM | POA: Diagnosis present

## 2024-01-24 DIAGNOSIS — R0902 Hypoxemia: Secondary | ICD-10-CM

## 2024-01-24 DIAGNOSIS — L89152 Pressure ulcer of sacral region, stage 2: Secondary | ICD-10-CM | POA: Diagnosis present

## 2024-01-24 DIAGNOSIS — M62830 Muscle spasm of back: Secondary | ICD-10-CM | POA: Diagnosis present

## 2024-01-24 DIAGNOSIS — E860 Dehydration: Secondary | ICD-10-CM | POA: Diagnosis present

## 2024-01-24 DIAGNOSIS — I9589 Other hypotension: Secondary | ICD-10-CM | POA: Diagnosis present

## 2024-01-24 DIAGNOSIS — N390 Urinary tract infection, site not specified: Secondary | ICD-10-CM | POA: Insufficient documentation

## 2024-01-24 DIAGNOSIS — Z7982 Long term (current) use of aspirin: Secondary | ICD-10-CM

## 2024-01-24 DIAGNOSIS — R64 Cachexia: Secondary | ICD-10-CM | POA: Diagnosis present

## 2024-01-24 DIAGNOSIS — R0609 Other forms of dyspnea: Secondary | ICD-10-CM | POA: Diagnosis not present

## 2024-01-24 DIAGNOSIS — F10139 Alcohol abuse with withdrawal, unspecified: Secondary | ICD-10-CM | POA: Diagnosis not present

## 2024-01-24 DIAGNOSIS — M62838 Other muscle spasm: Secondary | ICD-10-CM | POA: Diagnosis present

## 2024-01-24 DIAGNOSIS — G8929 Other chronic pain: Secondary | ICD-10-CM | POA: Diagnosis present

## 2024-01-24 DIAGNOSIS — E162 Hypoglycemia, unspecified: Secondary | ICD-10-CM

## 2024-01-24 DIAGNOSIS — N12 Tubulo-interstitial nephritis, not specified as acute or chronic: Secondary | ICD-10-CM | POA: Diagnosis present

## 2024-01-24 DIAGNOSIS — I959 Hypotension, unspecified: Secondary | ICD-10-CM

## 2024-01-24 DIAGNOSIS — E785 Hyperlipidemia, unspecified: Secondary | ICD-10-CM | POA: Diagnosis present

## 2024-01-24 DIAGNOSIS — E861 Hypovolemia: Secondary | ICD-10-CM | POA: Diagnosis present

## 2024-01-24 DIAGNOSIS — F1910 Other psychoactive substance abuse, uncomplicated: Secondary | ICD-10-CM | POA: Insufficient documentation

## 2024-01-24 DIAGNOSIS — E43 Unspecified severe protein-calorie malnutrition: Secondary | ICD-10-CM | POA: Diagnosis present

## 2024-01-24 DIAGNOSIS — W19XXXA Unspecified fall, initial encounter: Principal | ICD-10-CM

## 2024-01-24 DIAGNOSIS — K5900 Constipation, unspecified: Secondary | ICD-10-CM | POA: Diagnosis not present

## 2024-01-24 DIAGNOSIS — F329 Major depressive disorder, single episode, unspecified: Secondary | ICD-10-CM | POA: Diagnosis present

## 2024-01-24 DIAGNOSIS — E876 Hypokalemia: Secondary | ICD-10-CM | POA: Diagnosis present

## 2024-01-24 DIAGNOSIS — Z7984 Long term (current) use of oral hypoglycemic drugs: Secondary | ICD-10-CM

## 2024-01-24 DIAGNOSIS — F1090 Alcohol use, unspecified, uncomplicated: Secondary | ICD-10-CM

## 2024-01-24 DIAGNOSIS — E1142 Type 2 diabetes mellitus with diabetic polyneuropathy: Secondary | ICD-10-CM | POA: Diagnosis present

## 2024-01-24 DIAGNOSIS — J9811 Atelectasis: Secondary | ICD-10-CM | POA: Diagnosis present

## 2024-01-24 DIAGNOSIS — L89893 Pressure ulcer of other site, stage 3: Secondary | ICD-10-CM | POA: Diagnosis present

## 2024-01-24 DIAGNOSIS — F1410 Cocaine abuse, uncomplicated: Secondary | ICD-10-CM | POA: Diagnosis present

## 2024-01-24 DIAGNOSIS — E1165 Type 2 diabetes mellitus with hyperglycemia: Secondary | ICD-10-CM | POA: Diagnosis present

## 2024-01-24 DIAGNOSIS — Y92009 Unspecified place in unspecified non-institutional (private) residence as the place of occurrence of the external cause: Secondary | ICD-10-CM

## 2024-01-24 DIAGNOSIS — M169 Osteoarthritis of hip, unspecified: Secondary | ICD-10-CM | POA: Insufficient documentation

## 2024-01-24 DIAGNOSIS — Y906 Blood alcohol level of 120-199 mg/100 ml: Secondary | ICD-10-CM | POA: Diagnosis present

## 2024-01-24 DIAGNOSIS — J45901 Unspecified asthma with (acute) exacerbation: Secondary | ICD-10-CM | POA: Diagnosis not present

## 2024-01-24 DIAGNOSIS — M160 Bilateral primary osteoarthritis of hip: Secondary | ICD-10-CM | POA: Diagnosis present

## 2024-01-24 DIAGNOSIS — Z87891 Personal history of nicotine dependence: Secondary | ICD-10-CM

## 2024-01-24 DIAGNOSIS — B3731 Acute candidiasis of vulva and vagina: Secondary | ICD-10-CM | POA: Diagnosis present

## 2024-01-24 DIAGNOSIS — K219 Gastro-esophageal reflux disease without esophagitis: Secondary | ICD-10-CM | POA: Diagnosis present

## 2024-01-24 DIAGNOSIS — Z789 Other specified health status: Secondary | ICD-10-CM

## 2024-01-24 DIAGNOSIS — S50312A Abrasion of left elbow, initial encounter: Secondary | ICD-10-CM | POA: Diagnosis present

## 2024-01-24 DIAGNOSIS — E1169 Type 2 diabetes mellitus with other specified complication: Secondary | ICD-10-CM | POA: Diagnosis not present

## 2024-01-24 DIAGNOSIS — F419 Anxiety disorder, unspecified: Secondary | ICD-10-CM | POA: Diagnosis present

## 2024-01-24 LAB — COMPREHENSIVE METABOLIC PANEL WITH GFR
ALT: 23 U/L (ref 0–44)
AST: 22 U/L (ref 15–41)
Albumin: 3.4 g/dL — ABNORMAL LOW (ref 3.5–5.0)
Alkaline Phosphatase: 50 U/L (ref 38–126)
Anion gap: 12 (ref 5–15)
BUN: 8 mg/dL (ref 6–20)
CO2: 24 mmol/L (ref 22–32)
Calcium: 8.8 mg/dL — ABNORMAL LOW (ref 8.9–10.3)
Chloride: 93 mmol/L — ABNORMAL LOW (ref 98–111)
Creatinine, Ser: 0.59 mg/dL (ref 0.44–1.00)
GFR, Estimated: >60 mL/min (ref >60)
Glucose, Bld: 552 mg/dL (ref 70–99)
Potassium: 3.2 mmol/L — ABNORMAL LOW (ref 3.5–5.1)
Sodium: 129 mmol/L — ABNORMAL LOW (ref 135–145)
Total Bilirubin: 0.3 mg/dL (ref 0.0–1.2)
Total Protein: 6.2 g/dL — ABNORMAL LOW (ref 6.5–8.1)

## 2024-01-24 LAB — CBC
HCT: 37.5 % (ref 36.0–46.0)
Hemoglobin: 12.8 g/dL (ref 12.0–15.0)
MCH: 31.8 pg (ref 26.0–34.0)
MCHC: 34.1 g/dL (ref 30.0–36.0)
MCV: 93.1 fL (ref 80.0–100.0)
Platelets: 241 K/uL (ref 150–400)
RBC: 4.03 MIL/uL (ref 3.87–5.11)
RDW: 11.4 % — ABNORMAL LOW (ref 11.5–15.5)
WBC: 11.4 K/uL — ABNORMAL HIGH (ref 4.0–10.5)
nRBC: 0 % (ref 0.0–0.2)

## 2024-01-24 LAB — URINALYSIS, ROUTINE W REFLEX MICROSCOPIC
Bilirubin Urine: NEGATIVE
Glucose, UA: >=500 mg/dL — AB
Hgb urine dipstick: NEGATIVE
Ketones, ur: NEGATIVE mg/dL
Nitrite: NEGATIVE
Protein, ur: NEGATIVE mg/dL
Specific Gravity, Urine: 1.011 (ref 1.005–1.030)
pH: 6 (ref 5.0–8.0)

## 2024-01-24 LAB — GLUCOSE, CAPILLARY
Glucose-Capillary: 519 mg/dL (ref 70–99)
Glucose-Capillary: 455 mg/dL — ABNORMAL HIGH (ref 70–99)

## 2024-01-24 LAB — BASIC METABOLIC PANEL WITH GFR
Anion gap: 9 (ref 5–15)
BUN: 10 mg/dL (ref 6–20)
CO2: 27 mmol/L (ref 22–32)
Calcium: 8.5 mg/dL — ABNORMAL LOW (ref 8.9–10.3)
Chloride: 97 mmol/L — ABNORMAL LOW (ref 98–111)
Creatinine, Ser: 0.58 mg/dL (ref 0.44–1.00)
GFR, Estimated: >60 mL/min (ref >60)
Glucose, Bld: 446 mg/dL — ABNORMAL HIGH (ref 70–99)
Potassium: 4.1 mmol/L (ref 3.5–5.1)
Sodium: 133 mmol/L — ABNORMAL LOW (ref 135–145)

## 2024-01-24 LAB — I-STAT CHEM 8, ED
BUN: 9 mg/dL (ref 6–20)
Calcium, Ion: 1.07 mmol/L — ABNORMAL LOW (ref 1.15–1.40)
Chloride: 91 mmol/L — ABNORMAL LOW (ref 98–111)
Creatinine, Ser: 0.8 mg/dL (ref 0.44–1.00)
Glucose, Bld: 579 mg/dL (ref 70–99)
HCT: 40 % (ref 36.0–46.0)
Hemoglobin: 13.6 g/dL (ref 12.0–15.0)
Potassium: 3.1 mmol/L — ABNORMAL LOW (ref 3.5–5.1)
Sodium: 130 mmol/L — ABNORMAL LOW (ref 135–145)
TCO2: 24 mmol/L (ref 22–32)

## 2024-01-24 LAB — ETHANOL: Alcohol, Ethyl (B): 184 mg/dL — ABNORMAL HIGH (ref <15)

## 2024-01-24 LAB — I-STAT VENOUS BLOOD GAS, ED
Acid-Base Excess: 0 mmol/L (ref 0.0–2.0)
Bicarbonate: 25.8 mmol/L (ref 20.0–28.0)
Calcium, Ion: 1.11 mmol/L — ABNORMAL LOW (ref 1.15–1.40)
HCT: 39 % (ref 36.0–46.0)
Hemoglobin: 13.3 g/dL (ref 12.0–15.0)
O2 Saturation: 83 %
Potassium: 3.1 mmol/L — ABNORMAL LOW (ref 3.5–5.1)
Sodium: 130 mmol/L — ABNORMAL LOW (ref 135–145)
TCO2: 27 mmol/L (ref 22–32)
pCO2, Ven: 44 mmHg (ref 44–60)
pH, Ven: 7.377 (ref 7.25–7.43)
pO2, Ven: 48 mmHg — ABNORMAL HIGH (ref 32–45)

## 2024-01-24 LAB — LACTIC ACID, PLASMA
Lactic Acid, Venous: 1.9 mmol/L (ref 0.5–1.9)
Lactic Acid, Venous: 1.7 mmol/L (ref 0.5–1.9)

## 2024-01-24 LAB — I-STAT CG4 LACTIC ACID, ED: Lactic Acid, Venous: 2.9 mmol/L (ref 0.5–1.9)

## 2024-01-24 LAB — PROTIME-INR
INR: 0.9 (ref 0.8–1.2)
Prothrombin Time: 12.7 s (ref 11.4–15.2)

## 2024-01-24 LAB — SAMPLE TO BLOOD BANK

## 2024-01-24 LAB — LIPASE, BLOOD: Lipase: 37 U/L (ref 11–51)

## 2024-01-24 LAB — MAGNESIUM: Magnesium: 1.5 mg/dL — ABNORMAL LOW (ref 1.7–2.4)

## 2024-01-24 MED ORDER — ENOXAPARIN SODIUM 40 MG/0.4ML IJ SOSY
40.0000 mg | PREFILLED_SYRINGE | INTRAMUSCULAR | Status: DC
  Administered 2024-01-24 – 2024-02-15 (×26): 40 mg via SUBCUTANEOUS
  Filled 2024-01-24 (×23): qty 0.4

## 2024-01-24 MED ORDER — ATORVASTATIN CALCIUM 40 MG PO TABS
40.0000 mg | ORAL_TABLET | Freq: Every day | ORAL | Status: DC
Start: 2024-01-25 — End: 2024-02-16
  Administered 2024-01-25 – 2024-02-16 (×26): 40 mg via ORAL
  Filled 2024-01-24 (×23): qty 1

## 2024-01-24 MED ORDER — FOLIC ACID 1 MG PO TABS
1.0000 mg | ORAL_TABLET | Freq: Every day | ORAL | Status: DC
  Administered 2024-01-24 – 2024-02-16 (×27): 1 mg via ORAL
  Filled 2024-01-24 (×24): qty 1

## 2024-01-24 MED ORDER — GERHARDT'S BUTT CREAM
TOPICAL_CREAM | Freq: Two times a day (BID) | CUTANEOUS | Status: DC
  Administered 2024-01-26 – 2024-02-16 (×9): 1 via TOPICAL
  Filled 2024-01-24: qty 60

## 2024-01-24 MED ORDER — INSULIN ASPART 100 UNIT/ML IJ SOLN
0.0000 [IU] | Freq: Three times a day (TID) | INTRAMUSCULAR | Status: DC
  Administered 2024-01-24: 9 [IU] via SUBCUTANEOUS

## 2024-01-24 MED ORDER — POTASSIUM CHLORIDE CRYS ER 20 MEQ PO TBCR
40.0000 meq | EXTENDED_RELEASE_TABLET | Freq: Once | ORAL | Status: AC
  Administered 2024-01-24: 40 meq via ORAL
  Filled 2024-01-24: qty 2

## 2024-01-24 MED ORDER — MAGNESIUM SULFATE 2 GM/50ML IV SOLN
2.0000 g | Freq: Once | INTRAVENOUS | Status: AC
  Administered 2024-01-24: 2 g via INTRAVENOUS
  Filled 2024-01-24: qty 50

## 2024-01-24 MED ORDER — IOHEXOL 350 MG/ML SOLN
75.0000 mL | Freq: Once | INTRAVENOUS | Status: AC | PRN
  Administered 2024-01-24: 75 mL via INTRAVENOUS

## 2024-01-24 MED ORDER — INSULIN ASPART 100 UNIT/ML IJ SOLN
9.0000 [IU] | Freq: Once | INTRAMUSCULAR | Status: AC
  Administered 2024-01-24: 9 [IU] via SUBCUTANEOUS

## 2024-01-24 MED ORDER — NICOTINE 14 MG/24HR TD PT24
14.0000 mg | MEDICATED_PATCH | Freq: Every day | TRANSDERMAL | Status: DC
  Administered 2024-01-24: 14 mg via TRANSDERMAL
  Filled 2024-01-24: qty 1

## 2024-01-24 MED ORDER — ONDANSETRON 4 MG PO TBDP
4.0000 mg | ORAL_TABLET | Freq: Three times a day (TID) | ORAL | Status: DC | PRN
  Administered 2024-01-26: 4 mg via ORAL
  Filled 2024-01-24: qty 1

## 2024-01-24 MED ORDER — METOCLOPRAMIDE HCL 5 MG/ML IJ SOLN
10.0000 mg | Freq: Once | INTRAMUSCULAR | Status: AC
  Administered 2024-01-24: 10 mg via INTRAVENOUS
  Filled 2024-01-24: qty 2

## 2024-01-24 MED ORDER — LACTATED RINGERS IV BOLUS
1000.0000 mL | Freq: Once | INTRAVENOUS | Status: AC
  Administered 2024-01-24: 1000 mL via INTRAVENOUS

## 2024-01-24 MED ORDER — INSULIN GLARGINE-YFGN 100 UNIT/ML ~~LOC~~ SOLN
10.0000 [IU] | SUBCUTANEOUS | Status: DC
  Filled 2024-01-24 (×2): qty 0.1

## 2024-01-24 MED ORDER — OXYCODONE HCL 5 MG PO TABS
2.5000 mg | ORAL_TABLET | Freq: Once | ORAL | Status: AC
  Administered 2024-01-24: 2.5 mg via ORAL
  Filled 2024-01-24: qty 1

## 2024-01-24 MED ORDER — LACTATED RINGERS IV SOLN: INTRAVENOUS | Status: DC

## 2024-01-24 MED ORDER — ADULT MULTIVITAMIN W/MINERALS CH
1.0000 | ORAL_TABLET | Freq: Every day | ORAL | Status: DC
  Administered 2024-01-24 – 2024-02-16 (×27): 1 via ORAL
  Filled 2024-01-24 (×24): qty 1

## 2024-01-24 MED ORDER — ACETAMINOPHEN 325 MG PO TABS
650.0000 mg | ORAL_TABLET | Freq: Four times a day (QID) | ORAL | Status: DC | PRN
  Administered 2024-01-24 – 2024-01-25 (×2): 650 mg via ORAL
  Filled 2024-01-24 (×2): qty 2

## 2024-01-24 MED ORDER — INSULIN GLARGINE-YFGN 100 UNIT/ML ~~LOC~~ SOLN
10.0000 [IU] | SUBCUTANEOUS | Status: DC
  Administered 2024-01-24: 10 [IU] via SUBCUTANEOUS
  Filled 2024-01-24 (×2): qty 0.1

## 2024-01-24 MED ORDER — INSULIN ASPART 100 UNIT/ML IJ SOLN
10.0000 [IU] | Freq: Once | INTRAMUSCULAR | Status: AC
  Administered 2024-01-24: 10 [IU] via INTRAVENOUS

## 2024-01-24 MED ORDER — POTASSIUM CHLORIDE CRYS ER 20 MEQ PO TBCR
40.0000 meq | EXTENDED_RELEASE_TABLET | Freq: Once | ORAL | Status: AC
Start: 2024-01-24 — End: 2024-01-24
  Administered 2024-01-24: 40 meq via ORAL
  Filled 2024-01-24: qty 2

## 2024-01-24 MED ORDER — ACETAMINOPHEN 650 MG RE SUPP: 650.0000 mg | Freq: Four times a day (QID) | RECTAL | Status: DC | PRN

## 2024-01-24 MED ORDER — THIAMINE MONONITRATE 100 MG PO TABS
100.0000 mg | ORAL_TABLET | Freq: Every day | ORAL | Status: DC
  Administered 2024-01-24 – 2024-02-15 (×26): 100 mg via ORAL
  Filled 2024-01-24 (×23): qty 1

## 2024-01-24 MED ORDER — SODIUM CHLORIDE 0.9 % IV SOLN
1.0000 g | Freq: Once | INTRAVENOUS | Status: AC
  Administered 2024-01-24: 1 g via INTRAVENOUS
  Filled 2024-01-24: qty 10

## 2024-01-24 MED ORDER — MEDIHONEY WOUND/BURN DRESSING EX PSTE
1.0000 | PASTE | Freq: Every day | CUTANEOUS | Status: DC
  Administered 2024-01-25 – 2024-02-16 (×24): 1 via TOPICAL
  Filled 2024-01-24 (×2): qty 44

## 2024-01-24 NOTE — Assessment & Plan Note (Addendum)
 Patient reports chronic low back pain, denying new onset pain since the fall -Start Tylenol  650 mg every 6h as needed

## 2024-01-24 NOTE — Progress Notes (Signed)
 CSW added substance abuse resources to patient's AVS.  Edwin Dada, MSW, LCSW Transitions of Care  Clinical Social Worker II [phone]

## 2024-01-24 NOTE — Discharge Instructions (Addendum)
 Dear Nancy [name],   Thank you for letting us  participate in your care! You were admitted to the hospital for pyelonephritis, which is a kidney infection, and given antibiotics.   POST-HOSPITAL & CARE INSTRUCTIONS We recommend following up with your PCP within 1 week from being discharged from the hospital. Please let PCP/Specialists know of any changes in medications that were made which you will be able to see in the medications section of this packet.  DOCTOR'S APPOINTMENTS & FOLLOW UP Future Appointments  Date Time Provider Department Center  03/08/2024  2:45 PM Loreda Hacker, DPM [facility]     Thank you for choosing [facility]! Take care and be well!  Family Medicine Teaching Service Inpatient Team [place]  [facility]  [address] [place], [place] [address] [phone]     Please call 1-800-QUITNOW for smoking cessation resources.                                     Outpatient Substance Abuse  Treatment- uninsured  Narcotics Anonymous 24-HOUR HELPLINE Pre-recorded for Meeting Schedules [phone]  [email or web address] ALCOHOLICS ANONYMOUS  [place]   Answering Service [phone] Please Note: All [place] Meetings are Non-smoking [email or web address]  Alcohol and Drug Services -  Insurance: Medicaid /State funding/private insurance Methadone, suboxone/Intensive outpatient     [phone] Fax: [phone] [address] [place], [place], [address] [place] [phone] Fax: [phone]    [address], [place], [place], [address] ([address] [place], [place], [place], [place], [place], [place], [place], [place]) [facility] [email or web address] Accepts State funding/Medicaid Transitional housing, Intensive Outpatient Treatment, Outpatient treatment, Veterans Services  Phone: [phone] Fax: [phone] Address: [address], [place] [address]    [employer] of Care ([email or web address].[email or web address]/) Insurance: Medicaid Case Management, Administrator, arts, Medication Management, Outpatient Therapy, Psychosocial Rehabilitation, Substance Abuse Intensive Outpatient  Phone: [phone] Fax: [phone] [address], [place], [place], [address]  [facility] Medicaid, most private insurance providers Types of Program: Individual/Group Therapy, Substance Abuse Treatment  Phone: [place] [phone] Fax: [phone] [address], [place], [place], [address] [place] [phone] [address] [place], [place], [address]  [facility]  Medicaid Types of Program: SAIOP  Phone: [phone] Fax: [phone] [address] [place], [place], [place], [address] [facility] Medicaid/state funds Crisis line [phone] [place] [facility] [phone] [place] [phone] [place] [phone]  [facility] [address];  [place], [place] [address] [phone]  Substance Abuse Intensive Outpatient Program [facility] [address] [place], [place] [address] [phone]- Substance abuse treatment  Successful Transitions  Insurance: [facility], [address], sliding scale Types of Program: substance abuse treatment, transportation assistance Phone: [phone] Fax: [phone] Address: [address], [place] [address] [facility] ([email or web address]) Insurance: UHC, [place], [place], [place] Program: addiction counseling, detoxification,  Phone: [phone]  Fax: [phone] Address: [address] [place],  [place] [address]  [facility] (statewide facilities/programs) [address] (Medicaid/state funds) [place], [place] [address]                      http://[email or web address]/ [phone] [place]- [phone] [place]- [phone] [facility]  [facility] the [place] (2 Locations) (  Medicaid/state funds) --[address]  walk in 8:30-12 and 1-2:30 [place], [id number]   [facility]- [phone] --[address] [place], [place] [address]  [phone] walk in 8:30-12 and 2-3:30  [facility] state funds/medicaid [address] [place], [place] [address] [phone] [facility] (Suboxone clinic) Medicaid/state funds  [address] [place], [place] [address] [phone]   [facility]  [address] [place], [place] [address]  [phone] (24 hours) [place]- [address] [place], [place] [address]  [phone] (24 hours) [place]- [address] [place] [phone] [place]- [address] [place] [phone] [place] [address] [place] [phone] [facility]- Medicaid and state funds  [place]- [address] [place], [place] [address] [phone] (24 hours) [place]- [address] [place], [place] [address] [phone] [facility]- [address] [place], [place] [address] [phone] (24 hours) [place] [address] [place], [place]  [address] [phone] [place]- [address]  [phone]

## 2024-01-24 NOTE — Assessment & Plan Note (Addendum)
 Reports drinking 40 ounces of beer daily, her last drink was this morning.  No current withdrawal symptoms. No hx DT/withdrawal seizures - CIWA protocol -Vitamin B 100 mg daily -Folic acid  1 mg daily

## 2024-01-24 NOTE — Assessment & Plan Note (Addendum)
 Patient states she tripped and recliner fell on top of her.  She denies any loss of consciousness, suspect this fall is not pathologic, patient has been endorsing diarrhea for the last 3 weeks and poor p.o. intake which could have contributes to generalized weakness. Pt endorses hitting her head. CT abdomen and pelvis, CT head, CT C-spine negative for acute abnormality. -- EKG: Normal sinus rhythm - Ordered orthostatic vital signs -- Fall precautions

## 2024-01-24 NOTE — Hospital Course (Addendum)
 Nancy [name] is a 58 y.o.female with a history of T2DM, admitted to the [facility] Medicine Teaching Service at [facility] for pyelonephritis. Her hospital course is detailed below:  Pyelonephritis: UA with rare bacteria and WBC clumps, pt endorsing dysuria and vomiting. CT A/P showed suspicion for pyelonephritis in left kidney. Blood cultures showed no growth, urine culture unable to be obtained.  Patient was started on IV Rocephin  for 3 days and transitioned to p.o. Duricef for 5 days.  Fall: Patient states she tripped and recliner fell on top of her. She denies any loss of consciousness, suspect this fall is not pathologic, patient has been endorsing diarrhea for the last 3 weeks and poor p.o. intake which could have contributes to generalized weakness. Pt endorses hitting her head. CT abdomen and pelvis, CT head, CT C-spine negative for acute abnormality. PT/OT have been consulted to evaluate patient. PT is recommending SNF placement.  Type 2 diabetes mellitus with hyperglycemia: CBG in the ED on arrival was 579 the patient reported that he had not had insulin  for 2 days.  Patient was started on sensitive sliding scale insulin  and 10 units of long-acting insulin  (half her home dose) and increased to moderate sliding scale insulin  and 16 units of long-acting insulin  to continued elevated CBGs. Her CBGs remain elevated so we increased her long-acting insulin  to 25 units and transitioned her from moderate sliding scale to resistant sliding scale insulin . A1c on admission is 14.3  Hypotension: Suspect hypotension on arrival due to hypovolemia 2/2 to 3 weeks of diarrhea, and some vomiting.  Continues IV fluids, and ordered orthostatic vital signs which were never performed.  Patient's blood pressure has remained normotensive throughout her hospitalization following IV fluids.  Diarrhea: Patient has been complaining of non-bloody diarrhea for the past 3 weeks, however presenting with unclear etiology.   We will continue to give her IV fluids and monitor bowel movements while she is inpatient.  She is reporting improvement in her diarrhea and she became constipated.  She was then started on MiraLAX  daily for constipation. Resolved before d/c.  Alcohol use disorder: Patient reports drinking 40 ounces of beer daily, her last drink was the morning of admission.  She denies history of DTs and seizures.  Patient is continuously monitored for withdrawal symptoms and placed on CIWA protocol. She was given vitamin B and folic acid  daily.  Her CIWA scores has remained low throughout her hospitalization.  Patient had been complaining of anxiety has ongoing withdrawal symptoms, and was started on hydroxyzine  3 times daily as needed.  Osteoarthritis: X-ray of pelvis showed severe degenerative changes, possible avascular necrosis.  Given Tylenol  650 mg every 6 hours as needed.  Still complaining of pain and so we increased her Tylenol  to 1000 mg every 6 hours as needed, and added 400 mg every 6 hours as needed and low-dose oxycodone  2.5 mg every 6 as needed.  Still complains of bilateral hip pain with ambulation and at rest.  We discussed with patient that PT and strength training would be best tools for pain control.  Cough Patient had developed a productive cough towards the end of her hospital stay. She was encouraged to use incentive spirometry. This resolved before d/c.  SOB On 17th day of hospital stay, patient developed intermittent SOB. CXR significant for mild edema. Diuresed with IV Lasix  20mg  with relief. Echo ordered given new dyspnea and faint heart sounds revealing left ventricular ejection fraction of 60 to 65% and a mobile calcified vegetation.  Cardiology  was consulted, and no need for TEE.  Patient continued to have to have desaturations on room air.  Patient placed on 2 L low-flow nasal cannula for comfort.  Repeat chest x-ray showed continued atelectasis and small pleural effusion, so we redosed  her Lasix .  BNP, D-dimer was obtained and show *** .  We restarted her home albuterol  inhaler 2 puffs every 4 hours as needed, as she has a history of asthma.   Other chronic conditions were medically managed with home medications and formulary alternatives as necessary (insomnia, diabetic neuropathy, pressure ulcer, constipation, chronic hip pain).  PCP Follow-up Recommendations: Readdress insulin  requirements - uncontrolled Address food insecurity PT for severe OA Follow-up pressure ulcer Consider ABIs Substance use counseling - alcohol and cocaine

## 2024-01-24 NOTE — H&P (Cosign Needed Addendum)
 Hospital Admission History and Physical Service Pager: [phone]  Patient name: Nancy [name] Medical record number: [id number] Date of Birth: [birth year] Age: 58 y.o. Gender: female  Primary Care Provider: Earvin Johnston PARAS, FNP Consultants: none Code Status: FULL Preferred Emergency Contact: None  Chief Complaint: fall  Assessment and Plan: Nancy [name] is a 58 y.o. female presenting status post fall. Differential for presentation of this includes pyelonephritis, dehydration, DKA, syncope, a-fib, stroke.  Patient endorsed chronic bilateral hip pain and chronic back pain, although denying exquisite flank tenderness. Patient says she had both hips replaced, however per chart review, patient has both native hips.  CT and x-ray shows significant osteoarthritis of her hips, which could be contributing to deconditioning, leading to her fall.  Suspect deconditioning and dehydration to be a main contributor to her fall.  ED urinalysis with small leukocytes and bacteria, CT abdomen/pelvis showing small multifocal wedge-shaped areas of decreased parenchymal enhancement suspicious for pyelonephritis.  Acute infection could have contributed to weakness leading to fall. We do not currently suspect DKA, even though she was hyperglycemic on admission.  Her UA did not show any ketones, and her labs have not demonstrated acidosis.  Patient is alert and oriented. Very low suspicision for stroke with negative head CT and no focal neurologic deficits on exam. Patient had RRR on exam and no history of A-fib; cardiac causes of fall remains low on differential. Assessment & Plan Pyelonephritis Suspected due to CT abdomen/pelvis showing multifocal wedge-shaped areas of decreased parenchymal enhancement suspicious for pyelonephritis.  She also endorsed dysuria and vomiting, meeting criteria for pyelonephritis. UA with budding yeast, small bacteria and small leukocytes. Currently afebrile and denying flank pain. --  Start Rocephin  1 g IV daily for 3 days, consider switching to p.o. tomorrow - Urine cultures ordered  -f/u urine and blood cultures Type 2 diabetes mellitus with hyperglycemia (HCC) Blood glucose the ED was 579, patient reports has not had insulin  for 2 days as she has not wanted to take it. Of note, pt was admitted on 10/2023 for DKA and A1c at that time was 15.5. Her home regimen includes: glimepiride , 20 units long acting insulin , 0-18 units for meal coverage, metformin  1000 BID - Semglee  10 units daily - Sensitive sliding scale insulin  ordered --CBG check 4 times daily with meals, and at bedtime -AM CMP, CBC Hypotension Suspect hypotension on arrival due to hypovolemia 2/2 to 3 weeks of diarrhea, and some vomiting - Continue IVF - Ordered orthostatic vital signs -- repeat lactate Diarrhea unclear etiology, presenting for 3 weeks, but could be due to hyperglycemia, viral infection - Continue IV fluids - Monitor BM while inpatient Alcohol use disorder Reports drinking 40 ounces of beer daily, her last drink was this morning.  No current withdrawal symptoms. No hx DT/withdrawal seizures - CIWA protocol -Vitamin B 100 mg daily -Folic acid  1 mg daily Fall Patient states she tripped and recliner fell on top of her.  She denies any loss of consciousness, suspect this fall is not pathologic, patient has been endorsing diarrhea for the last 3 weeks and poor p.o. intake which could have contributes to generalized weakness. Pt endorses hitting her head. CT abdomen and pelvis, CT head, CT C-spine negative for acute abnormality. -- EKG: Normal sinus rhythm - Ordered orthostatic vital signs -- Fall precautions OA (osteoarthritis) of hip Patient says she uses crutches at baseline, reports she has had bilateral hip replacement, but do not see this on chart review. 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 Reassuringly  not having pain over her hip so will not further investigate this inpatient.  -Start Tylenol  650 mg every 6h as needed Low back pain Patient reports chronic low back pain, denying new onset pain since the fall -Start Tylenol  650 mg every 6h as needed Substance abuse (HCC) Admits to daily cocaine use - TOC consult for substance use counseling - UDS pending  Chronic and Stable Problems:  HLD: Restart Lipitor 40 milligrams daily GERD: tums prn MDD: Consider restart Lexapro  10 mg daily, restart Cymbalta  60 mg daily. Need to confirm if pt is taking both of these medications   FEN/GI: Regular diet VTE Prophylaxis: Lovenox   Disposition: Home pending clinical improvement  History of Present Illness:  Nancy [name] is a 58 y.o. female presenting following a fall.  Patient says she has been feeling weak for the last month, and has been having diarrhea for the past 3 weeks.  Patient endorses hitting her head, however denies any loss of consciousness and says she has full recollection of the event.  She also endorses her recliner following on top of her after she fell.  Patient says her roommate called [phone].When asked if the patient has a PCP, she says that they were the ones who wanted her to come to the hospital.  Patient also endorses poor p.o. intake for past month.  In the ED, she was responsive to IV fluids, insulin  and started on IV Rocephin  for UTI. ED ordered CT head, C-spine, abdomen/pelvis all WNL.  Urinalysis showed bacteria, budding yeast, WBC clumps and urine culture ordered. XR pelvis showing severe bilateral hip OA.   Review Of Systems: Per HPI with the following additions: Denies chest pain, vomiting.  Denying flank tenderness. Endorsing dysuria, diarrhea for 3 weeks and chronic low back pain 2/2 to bilateral hip replacement.  Pertinent Past Medical History: Diabetes mellitus, alcohol use disorder, malnutrition Remainder reviewed in history tab.   Pertinent Past Surgical  History: none  Pertinent Social History: Tobacco use: 1 PPD for 40 years Alcohol use: 40 ounce beers daily, last drink this morning Other Substance use: Snorts/smokes cocaine daily Lives with roommate  Pertinent Family History: none  Important Outpatient Medications: Insulin  Lantus  20U daily Novolog  0-18U 4 times daily Glimepiride  2mg  daily Metformin  1000mg  BID Lipitor 40mg  daily Aspirin  81mg  - looks like she was taking this for atypical chest pain with cad risk factors  Objective: BP 113/80   Pulse 84   Temp (!) 97.5 F (36.4 C)   Resp 16   Ht 5' 9 (1.753 m)   Wt 75 kg   SpO2 100%   BMI 24.42 kg/m  Exam: General: Resting in bed, NAD, disheveled ENTM: Dry mucous membranes Neck: Full ROM Cardiovascular: Regular rate and rhythm, Respiratory: CTAB Gastrointestinal: No TTP, soft, nondistended abdomen MSK: Mild bruising on L elbow. No midline spine tenderness. No midline neck tenderness. No obvious bony deformities.  Neuro: Alert and oriented, neurovascularly intact  Labs:  CBC BMET  Recent Labs  Lab 01/24/24 0759 01/24/24 0808 01/24/24 0809  WBC 11.4*  --   --   HGB 12.8   < > 13.3  HCT 37.5   < > 39.0  PLT 241  --   --    < > = values in this interval not displayed.   Recent Labs  Lab 01/24/24 0759 01/24/24 0808 01/24/24 0809  NA 129* 130* 130*  K 3.2* 3.1*  3.1*  CL 93* 91*  --   CO2 24  --   --   BUN 8 9  --   CREATININE 0.59 0.80  --   GLUCOSE 552* 579*  --   CALCIUM  8.8*  --   --      EKG: Normal sinus rhythm, no STT changes   Imaging Studies Performed: XR Pelvis: Radiologist impression: No acute fracture or dislocation of the hips. Severe degenerative changes of the hips, greater on the RIGHT. Increased sclerosis of the femoral heads with partial collapse suspicious for avascular necrosis  CT head: Radiologist impression: Negative noncontrast head CT.  CT abdomen/pelvis: Radiologist impression: No evidence of traumatic injury.  Multifocal wedge-shaped areas of decreased parenchymal enhancement in left kidney with mild scarring, suspicious for pyelonephritis. Recommend correlation with urinalysis. Cholelithiasis. No radiographic evidence of cholecystitis. Small hiatal hernia. Severe bilateral hip osteoarthritis.  CT C-spine: Radiologist impression: No evidence of cervical spine fracture or subluxation. Degenerative spondylosis.   [name], DO 01/24/2024, 1:43 PM PGY-1, [facility] Health Family Medicine  FPTS Intern pager: [phone], text pages welcome Secure chat group [facility] Teaching Service   Upper Level Addendum: I have seen and evaluated this patient along with Dr. Idelle and reviewed the above note, making necessary revisions as appropriate. I agree with the medical decision making and physical exam as noted above. Elyce Prescott, DO PGY-2 [facility] Family Medicine Residency

## 2024-01-24 NOTE — Assessment & Plan Note (Addendum)
 Suspect hypotension on arrival due to hypovolemia 2/2 to 3 weeks of diarrhea, and some vomiting - Continue IVF - Ordered orthostatic vital signs -- repeat lactate

## 2024-01-24 NOTE — Plan of Care (Addendum)
 FMTS Interim Progress Note  S: Patient was seen at bedside by me and Dr. CHARLENA Pinal. She reports persistent bilateral hip pain, not worse than normal. Patient states her last alcoholic drink was yesterday. She states she has had withdrawal symptoms during prior admissions but never a seizure. She drinks and smokes marijuana to help with the chronic hip pain. She denies any dysuria, hematuria, flank pain, or any other complaints at this time.   O: BP 113/71 (BP Location: Right Arm)   Pulse 80   Temp 97.7 F (36.5 C)   Resp 18   Ht 5' 9 (1.753 m)   Wt 75 kg   SpO2 97%   BMI 24.42 kg/m   General: A&O, NAD, lying prone in bed HEENT: No sign of trauma, EOM grossly intact Cardiac: RRR, no m/r/g, 2+ radial pulses b/l Respiratory: CTAB, normal WOB, no w/c/r GI: Soft, non-distended, mild diffuse TTP, no masses/guarding/rebound Extremities: NTTP, no peripheral edema. Neuro: Normal gait, moves all four extremities appropriately, speaks clear full sentences Psych: Appropriate mood and affect  A/P: Pyelonephritis -monitor fever curve  -AM CBC -abx per day team   Fall Chronic bilateral hip pain -one time dose of 2.5mg  oxycodone  for persistent pain and to help patient sleep -fall precautions  Alcohol use disorder -CIWA protocol initiated -patient does not appear to be withdrawing at this time  Hyperglycemia T2DM on insulin  -complicated by hypokalemia, BMP tonight; repleted with 40meq -w/o acidosis -continue home insulin  -AM CBC, CMP   Nancy Credit, DO 01/24/2024, 8:23 PM PGY-1, [facility] Family Medicine Service pager [phone]

## 2024-01-24 NOTE — Consult Note (Signed)
 WOC Nurse Consult Note: Reason for Consult: R buttock and sacral wounds  Wound type: Stage 3 Pressure Injury sacrum and R ischium  Pressure Injury POA: Yes Measurement: see nursing flowsheet  Wound bed: R ischium 40% red 60% tan; sacrum 100% tan  Drainage (amount, consistency, odor) see nursing flowsheet  Periwound: erythema  Dressing procedure/placement/frequency: Cleanse sacral and R ischial wounds with NS, apply Medihoney to wound beds daily, cover with dry gauze and silicone foam.    Will write for Gerhardt's Butt Cream to surrounding intact skin of buttocks 2 times daily and prn soiling.   POC discussed with bedside nurse. WOC team will not follow. Re-consult if further needs arise.   Thank you,    Powell Bar MSN, RN-BC, [employer]

## 2024-01-24 NOTE — Assessment & Plan Note (Addendum)
 Blood glucose the ED was 579, patient reports has not had insulin  for 2 days as she has not wanted to take it. Of note, pt was admitted on 10/2023 for DKA and A1c at that time was 15.5. Her home regimen includes: glimepiride , 20 units long acting insulin , 0-18 units for meal coverage, metformin  1000 BID - Semglee  10 units daily - Sensitive sliding scale insulin  ordered --CBG check 4 times daily with meals, and at bedtime -AM CMP, CBC

## 2024-01-24 NOTE — Assessment & Plan Note (Addendum)
 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 Reassuringly not having pain over her hip so will not further investigate this inpatient.  -Start Tylenol  650 mg every 6h as needed

## 2024-01-24 NOTE — ED Notes (Signed)
Patient left the floor in stable condition, AOX4, with her belongings and staff.  

## 2024-01-24 NOTE — Assessment & Plan Note (Addendum)
 Suspected due to CT abdomen/pelvis showing multifocal wedge-shaped areas of decreased parenchymal enhancement suspicious for pyelonephritis.  She also endorsed dysuria and vomiting, meeting criteria for pyelonephritis. UA with budding yeast, small bacteria and small leukocytes. Currently afebrile and denying flank pain. -- Start Rocephin  1 g IV daily for 3 days, consider switching to p.o. tomorrow - Urine cultures ordered  -f/u urine and blood cultures

## 2024-01-24 NOTE — Progress Notes (Signed)
  FMTS Attending Admission Note: Nancy Keeling, MD  Personal pager:  [phone] FPTS Service Pager:  [phone]   I  have personally seen and examined this patient, reviewed their chart and results. I have discussed this patient with the resident. Will sign resident H&P as available.  [name] is a 58 yo F with PMH of T2DM, GERD, alcohol us  disorder who presented from home after multiple falls and hypotension with EMS. She notes last night she had a few beers and was falling, denies preceeding symptoms including lightheadedness or palpitations. She notes drinking a few beers daily, denies history of alcohol withdrawal seizures or DTs. She also notes diarrhea for the past three weeks. She notes some mild dysuria and back pain for past three weeks and feeling chills, no measured fevers.   On Exam, she is disheveled in bed, noting Iow back pain and bladder pain. Abd soft, no guarding or rebound, no CVA tenderness bilaterally. Heart RRR, no murmurs. Abrasion on left elbow noted. Moving all extremities.  Labs notable for hyperglycemia, no acidosis on VBG and no ketones in urine, hypokalemia 3.2, correct Na 138 for hyperglycemia, lacti acid 2.9, urinaylsis with rare bacteria and 11-20 WBC as well as WBC clumps. Alcohol level 184. CT AP suspicious for pyelonephritis. Ct head/c spine negative. EKG NSR, without St segment changes or TWI.   1. Ambulatory dysfunction and falls- possibly in setting of alcohol use as well as possible dehydration/orthostasis/hypotension. Will get orthostatic vitals, monitor on tele although she denies pre-ceeding symptoms. IVF.  2. Sepsis 2/2 Pyelonephritis- possible per CT, urine culture pending, pt with nonspecific sick symptoms don't really correlate, continue Ceftriaxone .  3. HAGMA, Lactic acidosis- 2/2 sepsis and dehydration in setting of diarrhea and poor PO intake. S/p IVF, will repeat to ensure trending in correct direction. Treating infection with IV antibiotics as  above. 4. Alcohol use disorder- last drink this AM, monitor with CIWA for signs of withdrawal, she denies history of complicated withdrawal. 5. Polysubstance use disorder- she notes using cocaine marijuana, will consult TOC for resources. 6. Pseudohyponatremia- Na 138 when corrected for glucose. 7. Hyperglycemia in the setting of T2DM on insulin - without acidosis or altered mental status. Restart lower dose of home insulin , can increase when she is tolerating PO. Repeat A1c.

## 2024-01-24 NOTE — ED Notes (Signed)
 Patient transported to X-ray

## 2024-01-24 NOTE — ED Notes (Signed)
 [place] sandwich graham crackers, pb and sprite provided to pt. Pt sleeping at this time.

## 2024-01-24 NOTE — ED Notes (Signed)
 CCMD contacted

## 2024-01-24 NOTE — Progress Notes (Signed)
   01/24/24 0750  Spiritual Encounters  Type of Visit Initial  Care provided to: Pt not available  Referral source Trauma page  Reason for visit Trauma  OnCall Visit No   Chaplain responded to a level one trauma. No family is present. If a chaplain is requested someone will respond.   Carley Birmingham [facility]  [phone]

## 2024-01-24 NOTE — Assessment & Plan Note (Addendum)
 Admits to daily cocaine use - TOC consult for substance use counseling - UDS pending

## 2024-01-24 NOTE — Assessment & Plan Note (Addendum)
 unclear etiology, presenting for 3 weeks, but could be due to hyperglycemia, viral infection - Continue IV fluids - Monitor BM while inpatient

## 2024-01-24 NOTE — ED Provider Notes (Signed)
 [place] EMERGENCY DEPARTMENT AT [facility] Provider Note   CSN: [id number] Arrival date & time: 01/24/24  9249     Patient presents with: Nancy [name]   IMO CUMBIE is a 58 y.o. female.   Pt is a 58 y/o female with PMH DM II, tobacco use, alcohol use, depression, GERD, chronic bilateral hip pain, frequent falls who initially was brought in as a level 1 trauma.  The report was that patient's roommate called EMS because they had been drinking all night and patient had had multiple falls.  When the initial EMS arrived patient was up and walking around but when the second truck showed up patient's blood pressure was in the 70s.  Initial bleed because of the report of her falling several times overnight they made her level 1 trauma.  However only injury seen was some bruising to the left elbow.  Patient reports she did fall and she thinks she may have hit her head but does not take any anticoagulation.  She became very anxious when everybody was in the room and wanted everybody to leave.  She states for the last 3 weeks she has been having diarrhea numerous times a day she reports more than 10 times a day.  Anytime she tries to eat she states it goes right through here.  She has also had some intermittent vomiting.  She has not been on antibiotics in the last few months for anything that she can remember.  She is complaining of right lower quadrant pain as well that has been associated with the diarrhea.  She cannot remember the last time she had anything to eat or drink.  EMS did give her a liter of fluid with improvement of her blood pressure from the 70s to the 90s.  She is denying any chest pain or shortness of breath at this time.  She states she feels like she is going through withdrawal but did admit to drinking last night.  She also reports she has been having a lot of migraine headaches.  The history is provided by the patient.  Fall       Prior to Admission medications    Medication Sig Start Date End Date Taking? Authorizing Provider  Accu-Chek Softclix Lancets lancets Use as instructed 01/04/24   Doretha Folks, MD  albuterol  (VENTOLIN  HFA) 108 (90 Base) MCG/ACT inhaler Inhale 1-2 puffs into the lungs every 4 (four) hours as needed for wheezing or shortness of breath. 10/27/23   Patsy Lenis, MD  aspirin  EC 81 MG tablet Take 1 tablet (81 mg total) by mouth daily. Swallow whole. 10/28/23   Patsy Lenis, MD  atorvastatin  (LIPITOR) 40 MG tablet Take 1 tablet (40 mg total) by mouth daily. 10/28/23   Patsy Lenis, MD  Blood Glucose Monitoring Suppl (BLOOD GLUCOSE MONITOR SYSTEM) w/Device KIT Use as directed 10/27/23   Patsy Lenis, MD  DULoxetine  (CYMBALTA ) 60 MG capsule Take 1 capsule (60 mg total) by mouth daily. 12/09/23     escitalopram  (LEXAPRO ) 10 MG tablet Take 1 tablet (10 mg total) by mouth daily. 10/28/23   Patsy Lenis, MD  glimepiride  (AMARYL ) 2 MG tablet Take 1 tablet (2 mg total) by mouth daily with breakfast. 01/04/24   Doretha Folks, MD  Glucose Blood (BLOOD GLUCOSE TEST STRIPS) STRP Use to check blood sugar 4 (four) times daily -  before meals and at bedtime. 01/04/24   Doretha Folks, MD  insulin  aspart (NOVOLOG ) 100 UNIT/ML FlexPen Inject 0-18 Units into the skin  4 (four) times daily -  before meals and at bedtime. Check Blood Glucose (BG) and inject per scale: BG <150= 0 unit; BG 150-200= 3 unit; BG 201-250= 6 unit; BG 251-300= 9 unit; BG 301-350= 12 unit; BG 351-400= 15 unit; BG >400= 18 unit and Call Primary Care. 01/04/24   Doretha Folks, MD  insulin  glargine (LANTUS ) 100 UNIT/ML Solostar Pen Inject 20 Units into the skin daily. 10/27/23   Patsy Lenis, MD  insulin  glargine (LANTUS ) 100 UNIT/ML Solostar Pen Inject 20 Units into the skin daily. 01/04/24   Doretha Folks, MD  Insulin  Pen Needle (PEN NEEDLES) 31G X 5 MM MISC Use with insulin  4 (four) times daily -  before meals and at bedtime. 10/27/23   Patsy Lenis, MD  Lancet Device MISC  Use (three) times daily as directed 10/27/23   Patsy Lenis, MD  Lancets MISC Use as directed to check blood sugar 4 (four) times daily -  before meals and at bedtime. 10/27/23   Patsy Lenis, MD  metFORMIN  (GLUCOPHAGE ) 1000 MG tablet Take 1 tablet (1,000 mg total) by mouth 2 (two) times daily with a meal. 12/09/23     metFORMIN  (GLUCOPHAGE ) 1000 MG tablet Take 1 tablet (1,000 mg total) by mouth 2 (two) times daily. 01/04/24   Doretha Folks, MD  nicotine  (NICODERM CQ  - DOSED IN MG/24 HOURS) 21 mg/24hr patch Place 1 patch (21 mg total) onto the skin daily. 10/29/23   Patsy Lenis, MD    Allergies: No known allergies    Review of Systems  Updated Vital Signs BP 100/71   Pulse 74   Temp 97.8 F (36.6 C) (Temporal)   Resp 17   Ht 5' 9 (1.753 m)   Wt 75 kg   SpO2 100%   BMI 24.42 kg/m   Physical Exam Vitals and nursing note reviewed.  Constitutional:      General: She is not in acute distress.    Appearance: She is well-developed and underweight.     Comments: Disheveled  HENT:     Head: Normocephalic and atraumatic.     Mouth/Throat:     Mouth: Mucous membranes are dry.  Eyes:     Pupils: Pupils are equal, round, and reactive to light.  Cardiovascular:     Rate and Rhythm: Normal rate and regular rhythm.     Pulses: Normal pulses.     Heart sounds: Normal heart sounds. No murmur heard.    No friction rub.  Pulmonary:     Effort: Pulmonary effort is normal.     Breath sounds: Normal breath sounds. No wheezing or rales.  Abdominal:     General: Bowel sounds are normal. There is no distension.     Palpations: Abdomen is soft.     Tenderness: There is abdominal tenderness in the right lower quadrant. There is no guarding or rebound.  Musculoskeletal:        General: No tenderness. Normal range of motion.     Right lower leg: Edema present.     Left lower leg: Edema present.     Comments: 2+ pitting edema present in bilateral feet and ankles.  Pain with palpation in  bilateral hips.  Minimal ecchymosis to the left elbow with full range of motion and no swelling noted  Skin:    General: Skin is warm and dry.     Findings: No rash.  Neurological:     Mental Status: She is alert and oriented to person, place, and time.  Cranial Nerves: No cranial nerve deficit.  Psychiatric:        Behavior: Behavior normal.     Comments: Anxious     (all labs ordered are listed, but only abnormal results are displayed) Labs Reviewed  COMPREHENSIVE METABOLIC PANEL WITH GFR - Abnormal; Notable for the following components:      Result Value   Sodium 129 (*)    Potassium 3.2 (*)    Chloride 93 (*)    Glucose, Bld 552 (*)    Calcium  8.8 (*)    Total Protein 6.2 (*)    Albumin 3.4 (*)    All other components within normal limits  CBC - Abnormal; Notable for the following components:   WBC 11.4 (*)    RDW 11.4 (*)    All other components within normal limits  ETHANOL - Abnormal; Notable for the following components:   Alcohol, Ethyl (B) 184 (*)    All other components within normal limits  URINALYSIS, ROUTINE W REFLEX MICROSCOPIC - Abnormal; Notable for the following components:   Color, Urine STRAW (*)    Glucose, UA >=500 (*)    Leukocytes,Ua SMALL (*)    Bacteria, UA RARE (*)    All other components within normal limits  MAGNESIUM  - Abnormal; Notable for the following components:   Magnesium  1.5 (*)    All other components within normal limits  I-STAT CHEM 8, ED - Abnormal; Notable for the following components:   Sodium 130 (*)    Potassium 3.1 (*)    Chloride 91 (*)    Glucose, Bld 579 (*)    Calcium , Ion 1.07 (*)    All other components within normal limits  I-STAT CG4 LACTIC ACID, ED - Abnormal; Notable for the following components:   Lactic Acid, Venous 2.9 (*)    All other components within normal limits  I-STAT VENOUS BLOOD GAS, ED - Abnormal; Notable for the following components:   pO2, Ven 48 (*)    Sodium 130 (*)    Potassium 3.1 (*)     Calcium , Ion 1.11 (*)    All other components within normal limits  CULTURE, BLOOD (ROUTINE X 2)  CULTURE, BLOOD (ROUTINE X 2)  URINE CULTURE  PROTIME-INR  LIPASE, BLOOD  SAMPLE TO BLOOD BANK    EKG: EKG Interpretation Date/Time:  Tuesday January 24 2024 09:18:35 EDT Ventricular Rate:  79 PR Interval:  170 QRS Duration:  98 QT Interval:  440 QTC Calculation: 505 R Axis:   88  Text Interpretation: Sinus rhythm Anterior infarct, old Baseline wander in lead(s) V5 No significant change since last tracing Confirmed by Doretha Folks ([id number]) on 01/24/2024 9:35:09 AM  Radiology: CT CERVICAL SPINE WO CONTRAST Result Date: 01/24/2024 CLINICAL DATA:  Fall.  Alcohol use.  Neck trauma and pain. EXAM: CT CERVICAL SPINE WITHOUT CONTRAST TECHNIQUE: Multidetector CT imaging of the cervical spine was performed without intravenous contrast. Multiplanar CT image reconstructions were also generated. RADIATION DOSE REDUCTION: This exam was performed according to the departmental dose-optimization program which includes automated exposure control, adjustment of the mA and/or kV according to patient size and/or use of iterative reconstruction technique. COMPARISON:  None Available. FINDINGS: Alignment: Normal. Skull base and vertebrae: No acute fracture. No primary bone lesion or focal pathologic process. Soft tissues and spinal canal: No prevertebral fluid or swelling. No visible canal hematoma. Disc levels: Moderate degenerative disc disease seen at C6-7. Moderate to severe right-sided facet DJD seen at C2-3 and C3-4. Upper chest: No acute  findings. Other: None. IMPRESSION: No evidence of cervical spine fracture or subluxation. Degenerative spondylosis, as described above. Electronically Signed   By: Norleen DELENA Kil M.D.   On: 01/24/2024 10:53   CT ABDOMEN PELVIS W CONTRAST Result Date: 01/24/2024 CLINICAL DATA:  Fall. Blunt abdominal trauma. Right lower quadrant pain. EXAM: CT ABDOMEN AND PELVIS WITH CONTRAST  TECHNIQUE: Multidetector CT imaging of the abdomen and pelvis was performed using the standard protocol following bolus administration of intravenous contrast. RADIATION DOSE REDUCTION: This exam was performed according to the departmental dose-optimization program which includes automated exposure control, adjustment of the mA and/or kV according to patient size and/or use of iterative reconstruction technique. CONTRAST:  75mL OMNIPAQUE  IOHEXOL  350 MG/ML SOLN COMPARISON:  None Available. FINDINGS: Lower chest:  Mild bibasilar atelectasis noted. Hepatobiliary: No hepatic laceration or mass identified. 3.8 cm calcified gallstone noted, without signs of cholecystitis or biliary ductal dilatation. Pancreas: No parenchymal laceration, mass, or inflammatory changes identified. Spleen: No evidence of splenic laceration. Adrenal/Urinary Tract: No hemorrhage or parenchymal lacerations identified. Mild left renal parenchymal scarring noted. Multifocal wedge-shaped areas of decreased parenchymal enhancement also seen in the left kidney during delayed excretory phase, suspicious pyelonephritis. No evidence of renal mass or abscess or hydronephrosis. Distended bladder noted, but otherwise unremarkable in appearance. Stomach/Bowel: Small hiatal hernia noted. Unopacified bowel loops are unremarkable in appearance. No evidence of hemoperitoneum. Vascular/Lymphatic: No evidence of abdominal aortic injury. No pathologically enlarged lymph nodes identified. Reproductive:  No mass or other significant abnormality identified. Other:  None. Musculoskeletal: No acute fractures or suspicious bone lesions identified. Severe bilateral hip osteoarthritis noted. IMPRESSION: No evidence of traumatic injury . Multifocal wedge-shaped areas of decreased parenchymal enhancement in left kidney with mild scarring, suspicious for pyelonephritis. Recommend correlation with urinalysis. Cholelithiasis. No radiographic evidence of cholecystitis. Small  hiatal hernia. Severe bilateral hip osteoarthritis. Electronically Signed   By: Norleen DELENA Kil M.D.   On: 01/24/2024 10:51   CT HEAD WO CONTRAST Result Date: 01/24/2024 CLINICAL DATA:  Fall.  Alcohol use.  Blunt head trauma. EXAM: CT HEAD WITHOUT CONTRAST TECHNIQUE: Contiguous axial images were obtained from the base of the skull through the vertex without intravenous contrast. RADIATION DOSE REDUCTION: This exam was performed according to the departmental dose-optimization program which includes automated exposure control, adjustment of the mA and/or kV according to patient size and/or use of iterative reconstruction technique. COMPARISON:  02/19/2019 FINDINGS: Brain: No evidence of intracranial hemorrhage, acute infarction, hydrocephalus, extra-axial collection, or mass lesion/mass effect. Vascular:  No hyperdense vessel or other acute findings. Skull: No evidence of fracture or other significant bone abnormality. Sinuses/Orbits:  No acute findings. Other: None. IMPRESSION: Negative noncontrast head CT. Electronically Signed   By: Norleen DELENA Kil M.D.   On: 01/24/2024 10:44   DG Pelvis 1-2 Views Result Date: 01/24/2024 CLINICAL DATA:  Chronic hip pain.  Fall.  Trauma. EXAM: PELVIS - 1-2 VIEW COMPARISON:  01/04/2024 FINDINGS: Unchanged partial collapse and increased sclerosis of the femoral heads bilaterally, worse on the RIGHT. Severe BILATERAL degenerative changes of the hips again seen. No fracture or dislocation. IMPRESSION: 1. No acute fracture or dislocation of the hips. 2. Severe degenerative changes of the hips, greater on the RIGHT. Increased sclerosis of the femoral heads with partial collapse suspicious for avascular necrosis. Electronically Signed   By: Aliene Lloyd M.D.   On: 01/24/2024 09:48   DG Chest 1 View Result Date: 01/24/2024 CLINICAL DATA:  Trauma.  Fall. EXAM: CHEST  1 VIEW COMPARISON:  01/04/2024 FINDINGS: Cardiomediastinal  silhouette and pulmonary vasculature are within normal limits.  Lungs are clear. IMPRESSION: No acute cardiopulmonary process. Electronically Signed   By: Aliene Lloyd M.D.   On: 01/24/2024 09:45     Procedures   Medications Ordered in the ED  lactated ringers  infusion ( Intravenous New Bag/Given 01/24/24 0846)  cefTRIAXone  (ROCEPHIN ) 1 g in sodium chloride  0.9 % 100 mL IVPB (has no administration in time range)  insulin  aspart (novoLOG ) injection 10 Units (has no administration in time range)  lactated ringers  bolus 1,000 mL (0 mLs Intravenous Stopped 01/24/24 0845)  metoCLOPramide  (REGLAN ) injection 10 mg (10 mg Intravenous Given 01/24/24 0813)  magnesium  sulfate IVPB 2 g 50 mL (0 g Intravenous Stopped 01/24/24 1124)  potassium chloride  SA (KLOR-CON  M) CR tablet 40 mEq (40 mEq Oral Given 01/24/24 1024)  iohexol  (OMNIPAQUE ) 350 MG/ML injection 75 mL (75 mLs Intravenous Contrast Given 01/24/24 0957)                                    Medical Decision Making Amount and/or Complexity of Data Reviewed Independent Historian: EMS External Data Reviewed: notes. Labs: ordered. Decision-making details documented in ED Course. Radiology: ordered and independent interpretation performed. Decision-making details documented in ED Course. ECG/medicine tests: ordered and independent interpretation performed. Decision-making details documented in ED Course.  Risk Prescription drug management. Decision regarding hospitalization.   Pt with multiple medical problems and comorbidities and presenting today with a complaint that caries a high risk for morbidity and mortality.  Presenting here today initially for a level 1 trauma.  However after getting the story and talking to the patient she was downgraded.  Patient has been partaking in alcohol and did have multiple falls but feel that her low blood pressure is more medically related as she has had recurrent episodes of diarrhea, vomiting and poor oral intake.  She has a history of diabetes and is not compliant.  Patient's  blood pressure is improving with fluids.  She is mentating normally and awake and alert.  She reports chronic pain in her hips but denies any other pain and only injury identified is some ecchymosis in her right elbow.  She denies any fall from a height.  She takes no anticoagulation.  Concern for DKA, electrolyte abnormalities, AKI, malnutrition, possibly colitis, appendicitis, diverticulitis.  Also concern for possible UTI.  Patient is having right lower quadrant pain but lower suspicion for ovarian or pelvic pathology as she is not sexually active at this time and denies any vaginal discharge.  Patient given IV fluids.  Labs and imaging are pending. I independently interpreted patient's labs and EKG.  Lactic acid is elevated at 2.9, VBG with normal pH and CO2 today with a normal bicarb and no signs of diabetic ketoacidosis.  Chem-8 with a blood sugar of 579 with reflexive hyponatremia of 130 and hypokalemia of 3.1, CBC with minimal leukocytosis of 11 and stable hemoglobin of 12, UA with small leukocytes, 11-20 white cells and rare bacteria with also white blood cells and yeast present, CMP with hyponatremia of 129, hypokalemia of 3.2 and blood sugar in the 500s.  Lipase is within normal limits.  I have independently visualized and interpreted pt's images today. Head CT without acute bleed, cervical spine without fracture.  Radiology reports negative for both.  CT of the abdomen and pelvis does show a gallstone and no evidence of renal stones.  Radiology reports multifocal wedge-shaped areas  of decreased parenchymal enhancement in the left kidney with mild scarring suspicious for pyelonephritis.  Patient reports her left back has been hurting.  She does have cholelithiasis but no evidence of cholecystitis and no right upper quadrant pain at this time and LFTs are normal.  Patient given Rocephin .  She is continuing on IV fluids and hypotension has improved.  Feel that patient needs admission for pyelonephritis,  sepsis.  Pt also given insulin  for elevated blood sugar. She is amenable to this plan.  Hospitalist was called for admission. CRITICAL CARE Performed by: Larwence Tu Total critical care time: 30 minutes Critical care time was exclusive of separately billable procedures and treating other patients. Critical care was necessary to treat or prevent imminent or life-threatening deterioration. Critical care was time spent personally by me on the following activities: development of treatment plan with patient and/or surrogate as well as nursing, discussions with consultants, evaluation of patient's response to treatment, examination of patient, obtaining history from patient or surrogate, ordering and performing treatments and interventions, ordering and review of laboratory studies, ordering and review of radiographic studies, pulse oximetry and re-evaluation of patient's condition.      Final diagnoses:  Fall, initial encounter  Pyelonephritis  Hypotension due to hypovolemia  Diarrhea, unspecified type  Alcohol abuse  Hypoglycemia    ED Discharge Orders     None          Doretha Folks, MD 01/24/24 1145

## 2024-01-25 DIAGNOSIS — N12 Tubulo-interstitial nephritis, not specified as acute or chronic: Secondary | ICD-10-CM | POA: Diagnosis not present

## 2024-01-25 DIAGNOSIS — L8990 Pressure ulcer of unspecified site, unspecified stage: Secondary | ICD-10-CM | POA: Diagnosis present

## 2024-01-25 DIAGNOSIS — E1140 Type 2 diabetes mellitus with diabetic neuropathy, unspecified: Secondary | ICD-10-CM | POA: Diagnosis present

## 2024-01-25 LAB — COMPREHENSIVE METABOLIC PANEL WITH GFR
ALT: 27 U/L (ref 0–44)
AST: 28 U/L (ref 15–41)
Albumin: 3.1 g/dL — ABNORMAL LOW (ref 3.5–5.0)
Alkaline Phosphatase: 49 U/L (ref 38–126)
Anion gap: 6 (ref 5–15)
BUN: 7 mg/dL (ref 6–20)
CO2: 30 mmol/L (ref 22–32)
Calcium: 8.9 mg/dL (ref 8.9–10.3)
Chloride: 96 mmol/L — ABNORMAL LOW (ref 98–111)
Creatinine, Ser: 0.45 mg/dL (ref 0.44–1.00)
GFR, Estimated: >60 mL/min (ref >60)
Glucose, Bld: 420 mg/dL — ABNORMAL HIGH (ref 70–99)
Potassium: 4.1 mmol/L (ref 3.5–5.1)
Sodium: 132 mmol/L — ABNORMAL LOW (ref 135–145)
Total Bilirubin: 0.2 mg/dL (ref 0.0–1.2)
Total Protein: 5.8 g/dL — ABNORMAL LOW (ref 6.5–8.1)

## 2024-01-25 LAB — GLUCOSE, CAPILLARY
Glucose-Capillary: 521 mg/dL (ref 70–99)
Glucose-Capillary: 263 mg/dL — ABNORMAL HIGH (ref 70–99)
Glucose-Capillary: 222 mg/dL — ABNORMAL HIGH (ref 70–99)
Glucose-Capillary: 298 mg/dL — ABNORMAL HIGH (ref 70–99)

## 2024-01-25 LAB — CBC
HCT: 38.6 % (ref 36.0–46.0)
Hemoglobin: 13.3 g/dL (ref 12.0–15.0)
MCH: 31.6 pg (ref 26.0–34.0)
MCHC: 34.5 g/dL (ref 30.0–36.0)
MCV: 91.7 fL (ref 80.0–100.0)
Platelets: 240 K/uL (ref 150–400)
RBC: 4.21 MIL/uL (ref 3.87–5.11)
RDW: 11.5 % (ref 11.5–15.5)
WBC: 10 K/uL (ref 4.0–10.5)
nRBC: 0 % (ref 0.0–0.2)

## 2024-01-25 MED ORDER — INSULIN ASPART 100 UNIT/ML IJ SOLN
0.0000 [IU] | Freq: Three times a day (TID) | INTRAMUSCULAR | Status: DC
  Administered 2024-01-25: 8 [IU] via SUBCUTANEOUS
  Administered 2024-01-25: 5 [IU] via SUBCUTANEOUS
  Administered 2024-01-25: 15 [IU] via SUBCUTANEOUS

## 2024-01-25 MED ORDER — NICOTINE POLACRILEX 2 MG MT GUM
2.0000 mg | CHEWING_GUM | OROMUCOSAL | Status: DC | PRN
  Administered 2024-01-26 – 2024-02-15 (×10): 2 mg via ORAL
  Filled 2024-01-25 (×11): qty 1

## 2024-01-25 MED ORDER — OXYCODONE HCL 5 MG PO TABS
2.5000 mg | ORAL_TABLET | Freq: Four times a day (QID) | ORAL | Status: DC | PRN
  Administered 2024-01-25 – 2024-02-11 (×62): 2.5 mg via ORAL
  Filled 2024-01-25 (×56): qty 1

## 2024-01-25 MED ORDER — INSULIN GLARGINE-YFGN 100 UNIT/ML ~~LOC~~ SOLN
16.0000 [IU] | SUBCUTANEOUS | Status: DC
  Administered 2024-01-25: 16 [IU] via SUBCUTANEOUS
  Filled 2024-01-25 (×2): qty 0.16

## 2024-01-25 MED ORDER — ACETAMINOPHEN 500 MG PO TABS
1000.0000 mg | ORAL_TABLET | Freq: Four times a day (QID) | ORAL | Status: DC
  Administered 2024-01-25 – 2024-02-16 (×91): 1000 mg via ORAL
  Filled 2024-01-25 (×85): qty 2

## 2024-01-25 MED ORDER — IBUPROFEN 400 MG PO TABS
400.0000 mg | ORAL_TABLET | Freq: Four times a day (QID) | ORAL | Status: DC
  Administered 2024-01-25 – 2024-02-01 (×38): 400 mg via ORAL
  Filled 2024-01-25 (×28): qty 1

## 2024-01-25 MED ORDER — SODIUM CHLORIDE 0.9 % IV SOLN
2.0000 g | Freq: Once | INTRAVENOUS | Status: AC
  Administered 2024-01-25: 2 g via INTRAVENOUS
  Filled 2024-01-25: qty 20

## 2024-01-25 MED ORDER — NICOTINE 21 MG/24HR TD PT24
21.0000 mg | MEDICATED_PATCH | Freq: Every day | TRANSDERMAL | Status: DC
  Administered 2024-01-25 – 2024-02-14 (×9): 21 mg via TRANSDERMAL
  Filled 2024-01-25 (×18): qty 1

## 2024-01-25 MED ORDER — ACETAMINOPHEN 650 MG RE SUPP: 650.0000 mg | Freq: Four times a day (QID) | RECTAL | Status: DC

## 2024-01-25 NOTE — Assessment & Plan Note (Addendum)
 Patient reports chronic low back pain, denying new onset pain since the fall -Increase Tylenol  to 1000 mg every 6h as needed -start ibuprofin 400mg  q6 PRN - Begin low dose pain regimen oxycodone  2.5 mg q. 6 as needed -PT/OT ordered to evaluate

## 2024-01-25 NOTE — Assessment & Plan Note (Signed)
 On exam today, patient is complaining of bilateral foot numbness and tingling.  I asked she takes Cymbalta  to help with this nerve pain, and she says that she is not sure, she is probably not taking Cymbalta  for a long time. - Restart patient's Cymbalta  60 mg daily

## 2024-01-25 NOTE — Assessment & Plan Note (Signed)
 This was present on admission, however patient is complaining of some pain with it today - Wound care on board

## 2024-01-25 NOTE — Assessment & Plan Note (Addendum)
 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 Reassuringly not having pain over her hip so will not further investigate this inpatient.  -Increase Tylenol  to 1000 mg every 6h as needed -start ibuprofin 400mg  q6 PRN - Begin low dose pain regimen oxycodone  2.5 mg q. 6 as needed -PT/OT ordered to evaluate

## 2024-01-25 NOTE — Evaluation (Addendum)
 Physical Therapy Evaluation Patient Details Name: Nancy [name] MRN: [id number] DOB: 1965-12-31 Today's Date: 01/25/2024  History of Present Illness  58 y.o. female presents to [facility] 01/24/24 with multiple falls and hypotension. On CIWA protocol with sepsis 2/2 pyelonephritis. PMH of T2DM, GERD, alcohol use disorder  Clinical Impression  Pt in bed upon arrival and agreeable to PT eval. PTA, pt was ModI with crutches or RW due to pain in B hips and low back. Pt reported increased difficulty with bathing/dressing leading up to hospital admit. Pt also reported between 12-15 falls in the past few months due to legs giving out or tripping over obstacles. In today's session, pt required MinA/CGA for bed mobility and to ambulate 43ft with RW. At this time, pt does not have any assistance available upon d/c from the hospital and is at an increased risk of falling. Attempted to complete 5xSTS, however, pt was only able to complete 3 repetitions in 50.20 seconds with UE support. Recommending post-acute rehab <3hrs to reduce falls risk and reduce risk of hospital readmission. Pt would benefit from acute skilled PT with current functional limitations listed below (see PT Problem List). Acute PT to follow.  3xSTS- 50.20 sec w/ UE support         If plan is discharge home, recommend the following: A little help with walking and/or transfers;A little help with bathing/dressing/bathroom;Assist for transportation;Help with stairs or ramp for entrance   Can travel by private vehicle   Yes    Equipment Recommendations None recommended by PT     Functional Status Assessment Patient has had a recent decline in their functional status and demonstrates the ability to make significant improvements in function in a reasonable and predictable amount of time.     Precautions / Restrictions Precautions Precautions: Fall Recall of Precautions/Restrictions: Intact Restrictions Weight Bearing Restrictions Per Provider  Order: No      Mobility  Bed Mobility Overal bed mobility: Needs Assistance Bed Mobility: Supine to Sit, Sit to Supine    Supine to sit: Min assist Sit to supine: Contact guard assist, Min assist   General bed mobility comments: able to bring LE's towards EOB with MinA to complete movement and assist with slight trunk elevation. With first sit to supine transfer pt used momentum with CGA for safety. Second sit to supine transfer required MinA for LE management    Transfers Overall transfer level: Needs assistance Equipment used: Rolling walker (2 wheels) Transfers: Sit to/from Stand, Bed to chair/wheelchair/BSC Sit to Stand: Min assist, Contact guard assist   Step pivot transfers: Contact guard assist    General transfer comment: Fluctuates between CGA for safety and MinA for slight boost-up. Cues for hand placement with RW    Ambulation/Gait Ambulation/Gait assistance: Contact guard assist Gait Distance (Feet): 20 Feet Assistive device: Rolling walker (2 wheels) Gait Pattern/deviations: Step-through pattern, Decreased stride length, Trunk flexed Gait velocity: decr     General Gait Details: forward flexed trunk with shuffling steps. No overt LOB     Balance Overall balance assessment: Needs assistance, Mild deficits observed, not formally tested, History of Falls Sitting-balance support: No upper extremity supported, Feet supported Sitting balance-Leahy Scale: Good     Standing balance support: Bilateral upper extremity supported, During functional activity, Reliant on assistive device for balance Standing balance-Leahy Scale: Poor Standing balance comment: reliant on RW and external support       Pertinent Vitals/Pain Pain Assessment Pain Assessment: Faces Faces Pain Scale: Hurts little more Pain Location: B hips  Pain Descriptors / Indicators: Aching, Discomfort Pain Intervention(s): Limited activity within patient's tolerance, Monitored during session,  Repositioned    Home Living Family/patient expects to be discharged to:: Unsure Living Arrangements: Alone    Additional Comments: Was staying with boyfriend who passed away. Is not going to be able to return to that house. No friends/family to help or offer housing    Prior Function Prior Level of Function : Needs assist;History of Falls (last six months)    Mobility Comments: ModI with crutches or RW. 12-15 falls in past few months due to legs giving out or tripping over obstacles ADLs Comments: Reported increased difficulty with bathing and LB dressing     Extremity/Trunk Assessment   Upper Extremity Assessment Upper Extremity Assessment: Defer to OT evaluation    Lower Extremity Assessment Lower Extremity Assessment: Generalized weakness    Cervical / Trunk Assessment Cervical / Trunk Assessment: Normal  Communication   Communication Communication: No apparent difficulties    Cognition Arousal: Alert Behavior During Therapy: WFL for tasks assessed/performed, Impulsive   PT - Cognitive impairments: Safety/Judgement    PT - Cognition Comments: slightly impulsive with pt moving quickly at times and without therapist close by Following commands: Intact       Cueing Cueing Techniques: Verbal cues      PT Assessment Patient needs continued PT services  PT Problem List Decreased strength;Decreased activity tolerance;Decreased balance;Decreased mobility       PT Treatment Interventions DME instruction;Gait training;Functional mobility training;Therapeutic activities;Therapeutic exercise;Stair training;Balance training;Neuromuscular re-education;Patient/family education    PT Goals (Current goals can be found in the Care Plan section)  Acute Rehab PT Goals Patient Stated Goal: to not have any more falls PT Goal Formulation: With patient Time For Goal Achievement: 02/08/24 Potential to Achieve Goals: Good    Frequency Min 2X/week        AM-PAC PT 6 Clicks  Mobility  Outcome Measure Help needed turning from your back to your side while in a flat bed without using bedrails?: A Little Help needed moving from lying on your back to sitting on the side of a flat bed without using bedrails?: A Little Help needed moving to and from a bed to a chair (including a wheelchair)?: A Little Help needed standing up from a chair using your arms (e.g., wheelchair or bedside chair)?: A Little Help needed to walk in hospital room?: A Little Help needed climbing 3-5 steps with a railing? : A Little 6 Click Score: 18    End of Session Equipment Utilized During Treatment: Gait belt Activity Tolerance: Patient tolerated treatment well Patient left: in bed;with call bell/phone within reach;with bed alarm set Nurse Communication: Mobility status PT Visit Diagnosis: Other abnormalities of gait and mobility (R26.89);Muscle weakness (generalized) (M62.81);History of falling (Z91.81)    Time: 8387-8369 PT Time Calculation (min) (ACUTE ONLY): 18 min   Charges:   PT Evaluation $PT Eval Low Complexity: 1 Low   PT General Charges $$ ACUTE PT VISIT: 1 Visit       Kate ORN, PT, DPT Secure Chat Preferred  Rehab Office [phone]   Kate BRAVO Wendolyn 01/25/2024, 4:41 PM

## 2024-01-25 NOTE — Assessment & Plan Note (Addendum)
 Patient states she tripped and recliner fell on top of her.  She denies any loss of consciousness, suspect this fall is not pathologic, patient has been endorsing diarrhea for the last 3 weeks and poor p.o. intake which could have contributes to generalized weakness. Pt endorses hitting her head. CT abdomen and pelvis, CT head, CT C-spine negative for acute abnormality. -- EKG: Normal sinus rhythm - Ordered orthostatic vital signs -- Fall precautions

## 2024-01-25 NOTE — Assessment & Plan Note (Addendum)
 unclear etiology, presenting for 3 weeks, but could be due to hyperglycemia, viral infection.  Suspect this is due to uncontrolled hyperglycemia. - Continue IV fluids - Monitor BM while inpatient  - Continuing to have nonbloody diarrhea -- Consider colonoscopy if diarrhea does not improve with glucose control, or becomes concerning (bloody)

## 2024-01-25 NOTE — Assessment & Plan Note (Addendum)
 Admits to daily cocaine use - TOC consult for substance use counseling - UDS pending -Increase nicotine  patch to 21 mg daily -Nicotine  gum 4 mg as needed

## 2024-01-25 NOTE — Consult Note (Signed)
 WOC Nurse Consult Note: WOC consult performed remotely utilizing chart review and imaging  Reason for Consult:Sacral and R gluteal pressure injury  Wound type: Stage 2 pressure injury to sacrum   Stage 3 pressure injury to R ischium Pressure Injury POA: Yes Measurement: see nursing flow sheets Wound bed: Sacrum: pink, dry, with area of epithelial lifting R Ishium: pink/red and moist, scant yellow slough at periphery of wound Drainage (amount, consistency, odor) see nursing flowsheets Periwound:  Sacrum: surrounded by area of red nonblanchable tissue Ischium: darkened skin surrounding wound suggestive of previous scarring Dressing procedure/placement/frequency:  Sacrum/Ischium: Cleanse with NS, pat dry.  Apply Xeroform to wound bed and cover with silicone foam dressing.  Change daily.   Utilize frequent turning to offload pressure and consider use of LALM for moisture and pressure management.  If patient is not ambulatory, consider use of bilateral Prevalon boots to off load pressure to heels.    WOC team will not follow patient at this time, please re-consult if new needs arise.  Thank you,  Doyal Polite, RN, MSN, [facility] WOC Team

## 2024-01-25 NOTE — Assessment & Plan Note (Addendum)
 Reports drinking 40 ounces of beer daily, her last drink was this morning.  No current withdrawal symptoms. No hx DT/withdrawal seizures - continue CIWA protocol for next 24-48h  -Vitamin B 100 mg daily -Folic acid  1 mg daily

## 2024-01-25 NOTE — Plan of Care (Signed)
  Problem: Pain Managment: Goal: General experience of comfort will improve and/or be controlled Outcome: Progressing   Problem: Safety: Goal: Ability to remain free from injury will improve Outcome: Progressing   Problem: Skin Integrity: Goal: Risk for impaired skin integrity will decrease Outcome: Progressing

## 2024-01-25 NOTE — TOC Initial Note (Signed)
 Transition of Care [facility]) - Initial/Assessment Note    Patient Details  Name: Nancy [name] MRN: [id number] Date of Birth: 1965-10-21  [facility] CM/SW Contact:    Nancy LITTIE Moose, LCSW Phone Number: 01/25/2024, 12:54 PM  Clinical Narrative:                 Pt admitted from home due to left breast chest pain. CSW receieved a call this morning from Ashely with [facility]. Rosina expressed concerns with pt living situation and asked if CSW could have her placed in a facility. CSW explained that pt could not be placed in a facility without a recommendation from her hospital care team but would follow to address pt needs as they arise. CSW provided SDOH resources to pt. Pt accepted resources and CSW provided packets to pt.     Barriers to Discharge: Continued Medical Work up   Patient Goals and CMS Choice            Expected Discharge Plan and Services       Living arrangements for the past 2 months: Single Family Home                                      Prior Living Arrangements/Services Living arrangements for the past 2 months: Single Family Home   Patient language and need for interpreter reviewed:: Yes          Care giver support system in place?: No (comment)   Criminal Activity/Legal Involvement Pertinent to Current Situation/Hospitalization: No - Comment as needed  Activities of Daily Living   ADL Screening (condition at time of admission) Independently performs ADLs?: No Does the patient have a NEW difficulty with bathing/dressing/toileting/self-feeding that is expected to last >3 days?: No Does the patient have a NEW difficulty with getting in/out of bed, walking, or climbing stairs that is expected to last >3 days?: No Does the patient have a NEW difficulty with communication that is expected to last >3 days?: No Is the patient deaf or have difficulty hearing?: No Does the patient have difficulty seeing, even when  wearing glasses/contacts?: No Does the patient have difficulty concentrating, remembering, or making decisions?: No  Permission Sought/Granted                  Emotional Assessment Appearance:: Appears older than stated age Attitude/Demeanor/Rapport: Engaged Affect (typically observed): Accepting Orientation: : Oriented to Self, Oriented to Situation, Oriented to  Time, Oriented to Place Alcohol / Substance Use: Not Applicable Psych Involvement: No (comment)  Admission diagnosis:  Alcohol abuse [F10.10] Hypoglycemia [E16.2] Pyelonephritis [N12] Hyperglycemia [R73.9] Fall, initial encounter [W19.XXXA] Diarrhea, unspecified type [R19.7] Hypotension due to hypovolemia [E86.1] Patient Active Problem List   Diagnosis Date Noted   Pressure ulcer 01/25/2024   Type 2 diabetes mellitus with diabetic neuropathy, unspecified (HCC) 01/25/2024   Type 2 diabetes mellitus with hyperglycemia (HCC) 01/24/2024   Alcohol use disorder 01/24/2024   Pyelonephritis 01/24/2024   Hypotension 01/24/2024   Low back pain 01/24/2024   Fall 01/24/2024   OA (osteoarthritis) of hip 01/24/2024   Diarrhea 01/24/2024   Substance abuse (HCC) 01/24/2024   Protein-calorie malnutrition, severe 10/27/2023   DKA, type 1 (HCC) 10/26/2023   DKA (diabetic ketoacidosis) (HCC) 10/26/2023   Osteoarthritis 10/26/2023   Hypokalemia 10/26/2023   Normocytic anemia 10/26/2023   Cellulitis and abscess of foot, except toes  04/07/2021   Pain due to onychomycosis of toenails of both feet 08/08/2019   Diabetes mellitus without complication (HCC) 08/08/2019   Unilateral primary osteoarthritis, left hip 08/03/2017   Unilateral primary osteoarthritis, right hip 08/03/2017   DMII (diabetes mellitus, type 2) (HCC) 07/29/2017   Primary osteoarthritis of left hip 02/10/2016   Asthma 12/31/2015   Hyperlipidemia 10/30/2015   Depression with anxiety 10/29/2015   Polysubstance abuse (HCC) 09/04/2015   Depression 09/04/2015    Osteoarthritis of right hip 09/04/2015   Alcohol abuse 12/05/2013   S/P alcohol detoxification 12/05/2013   Carcinoma in situ of cervix 11/24/2011   BV (bacterial vaginosis) 11/24/2011   ASCUS with positive high risk HPV 11/08/2011   PCP:  Earvin Johnston PARAS, FNP Pharmacy:   [id number] - [place] ([place]), [place] - [address] [place] ([place]) [place] [address] Phone: [phone] Fax: [phone]  [id number] [place], [place] - [address] AT [facility] [address] [place] [address] Phone: [phone] Fax: [phone]  [facility] [address] [place] [address] Phone: [phone] Fax: [phone]  [facility] [address], [address] [place] [address] Phone: [phone] Fax: [phone]     Social Drivers of Health (SDOH) Social History: SDOH Screenings   Food Insecurity: Food Insecurity Present (01/24/2024)  Housing: High Risk (01/24/2024)  Transportation Needs: Unmet Transportation Needs (01/24/2024)  Utilities: At Risk (01/24/2024)  Financial Resource Strain: Not on File (10/08/2021)   Received from [facility]  Physical Activity: Not on File (10/08/2021)   Received from [facility]  Social Connections: Socially Isolated (10/26/2023)  Stress: Not on File (10/08/2021)   [facility] from [facility]  Tobacco Use: High Risk (01/24/2024)   SDOH Interventions:     Readmission Risk Interventions     No data to display

## 2024-01-25 NOTE — Assessment & Plan Note (Addendum)
 Suspected due to CT abdomen/pelvis showing multifocal wedge-shaped areas of decreased parenchymal enhancement suspicious for pyelonephritis.  She also endorsed dysuria and vomiting, meeting criteria for pyelonephritis. UA with budding yeast, small bacteria and small leukocytes. Currently afebrile and denying flank pain. -- Continue Rocephin  2 g IV daily for 3 days, (day 2/3) consider switching to p.o. tomorrow - Urine cultures ordered  -f/u urine and blood cultures

## 2024-01-25 NOTE — Assessment & Plan Note (Addendum)
 Blood glucose remains elevated and poorly controlled, most recent CBG is 521. Of note, pt was admitted on 10/2023 for DKA and A1c at that time was 15.5. Her home regimen includes: glimepiride , 20 units long acting insulin , 0-18 units for meal coverage, metformin  1000 BID - Increase Semglee  to 16 units daily - Moderate sliding scale insulin  ordered --CBG check 4 times daily with meals, and at bedtime -AM CMP, CBC --Patient changed to carb modified diet --RD consulted

## 2024-01-25 NOTE — Plan of Care (Signed)

## 2024-01-25 NOTE — Assessment & Plan Note (Addendum)
 Suspect hypotension on arrival due to hypovolemia 2/2 to 3 weeks of diarrhea, and some vomiting - Continue IVF - Ordered orthostatic vital signs -- lactic acid trending down 2.9 -> 1.9 -> 1.7

## 2024-01-25 NOTE — Progress Notes (Signed)
 Daily Progress Note Intern Pager: [phone]  Patient name: Nancy [name] Medical record number: [id number] Date of birth: [birth year] Age: 58 y.o. Gender: female  Primary Care Provider: Earvin Johnston PARAS, FNP Consultants: Wound care, RD Code Status: FULL  Pt Overview and Major Events to Date:  8/5: admitted, started IV Rocephin   Assessment and Plan: Nancy [name] is a 58 y.o. female presenting status post fall.  She has also been experiencing diarrhea for the past 3 weeks, and was complaining of bilateral hip pain.  She is awaiting bilateral hip replacement, however she needs her A1c under control. Assessment & Plan Pyelonephritis Suspected due to CT abdomen/pelvis showing multifocal wedge-shaped areas of decreased parenchymal enhancement suspicious for pyelonephritis.  She also endorsed dysuria and vomiting, meeting criteria for pyelonephritis. UA with budding yeast, small bacteria and small leukocytes. Currently afebrile and denying flank pain. -- Continue Rocephin  2 g IV daily for 3 days, (day 2/3) consider switching to p.o. tomorrow - Urine cultures ordered  -f/u urine and blood cultures Type 2 diabetes mellitus with hyperglycemia (HCC) Blood glucose remains elevated and poorly controlled, most recent CBG is 521. Of note, pt was admitted on 10/2023 for DKA and A1c at that time was 15.5. Her home regimen includes: glimepiride , 20 units long acting insulin , 0-18 units for meal coverage, metformin  1000 BID - Increase Semglee  to 16 units daily - Moderate sliding scale insulin  ordered --CBG check 4 times daily with meals, and at bedtime -AM CMP, CBC --Patient changed to carb modified diet --RD consulted Hypotension Suspect hypotension on arrival due to hypovolemia 2/2 to 3 weeks of diarrhea, and some vomiting - Continue IVF - Ordered orthostatic vital signs -- lactic acid trending down 2.9 -> 1.9 -> 1.7 Diarrhea unclear etiology, presenting for 3 weeks, but could be due to  hyperglycemia, viral infection.  Suspect this is due to uncontrolled hyperglycemia. - Continue IV fluids - Monitor BM while inpatient  - Continuing to have nonbloody diarrhea -- Consider colonoscopy if diarrhea does not improve with glucose control, or becomes concerning (bloody)  Alcohol use disorder Reports drinking 40 ounces of beer daily, her last drink was this morning.  No current withdrawal symptoms. No hx DT/withdrawal seizures - continue CIWA protocol for next 24-48h  -Vitamin B 100 mg daily -Folic acid  1 mg daily  Fall Patient states she tripped and recliner fell on top of her.  She denies any loss of consciousness, suspect this fall is not pathologic, patient has been endorsing diarrhea for the last 3 weeks and poor p.o. intake which could have contributes to generalized weakness. Pt endorses hitting her head. CT abdomen and pelvis, CT head, CT C-spine negative for acute abnormality. -- EKG: Normal sinus rhythm - Ordered orthostatic vital signs -- Fall precautions OA (osteoarthritis) of hip Patient says she uses crutches at baseline, reports she has had bilateral hip replacement, but do not see this on chart review. 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 Reassuringly not having pain over her hip so will not further investigate this inpatient.  -Increase Tylenol  to 1000 mg every 6h as needed -start ibuprofin 400mg  q6 PRN - Begin low dose pain regimen oxycodone  2.5 mg q. 6 as needed -PT/OT ordered to evaluate Low back pain Patient reports chronic low back pain, denying new onset pain since the fall -Increase Tylenol  to 1000 mg every 6h as needed -start ibuprofin 400mg  q6 PRN - Begin low dose  pain regimen oxycodone  2.5 mg q. 6 as needed -PT/OT ordered to evaluate Substance abuse (HCC) Admits to daily cocaine use - TOC consult for substance use counseling - UDS pending -Increase nicotine  patch  to 21 mg daily -Nicotine  gum 4 mg as needed Pressure ulcer This was present on admission, however patient is complaining of some pain with it today - Wound care on board Type 2 diabetes mellitus with diabetic neuropathy, unspecified (HCC) On exam today, patient is complaining of bilateral foot numbness and tingling.  I asked she takes Cymbalta  to help with this nerve pain, and she says that she is not sure, she is probably not taking Cymbalta  for a long time. - Restart patient's Cymbalta  60 mg daily   Chronic and Stable Problems:  HLD: Restart Lipitor 40 milligrams daily GERD: tums prn MDD: Consider restart Lexapro  10 mg daily  FEN/GI: carb diet PPx: lovenox  Dispo:Home pending clinical improvement . Barriers include blood glucose control and diarrhea workup.  Subjective:  Says this is the best she has felt in a long time.  She is complaining of hip pain and low back pain that says has only gotten better with the oxycodone  received last night.  Patient was requesting glucose drink, as she was craving sugar. Patient says although she has never had a history of DTs or withdrawal seizures, she is starting to feel it. She feels foggy, anxious and tremulous. Is also requesting to use pull-ups to go to the bathroom.  Spoke with nursing and they say patient has been able to ambulate to use the restroom every 30 minutes to 1 hour.  Objective: Temp:  [97.5 F (36.4 C)-98.1 F (36.7 C)] 97.9 F (36.6 C) (08/06 0555) Pulse Rate:  [71-89] 76 (08/06 0555) Resp:  [13-23] 18 (08/06 0555) BP: (82-124)/(61-80) 116/73 (08/06 0555) SpO2:  [97 %-100 %] 100 % (08/06 0555) Weight:  [75 kg] 75 kg (08/05 0800) Physical Exam: General: Sitting up in bed eating breakfast, NAD Cardiovascular: Regular rate and rhythm, no rubs/murmur/gallop Respiratory: CTAB Abdomen: Soft, nontender, nondistended Extremities: Mild lower extremity nonpitting edema  Laboratory: Most recent CBC Lab Results  Component Value  Date   WBC 11.4 (H) 01/24/2024   HGB 13.3 01/24/2024   HCT 39.0 01/24/2024   MCV 93.1 01/24/2024   PLT 241 01/24/2024   Most recent BMP    Latest Ref Rng & Units 01/24/2024    9:25 PM  BMP  Glucose 70 - 99 mg/dL 553   BUN 6 - 20 mg/dL 10   Creatinine 9.55 - 1.00 mg/dL 9.41   Sodium 864 - 854 mmol/L 133   Potassium 3.5 - 5.1 mmol/L 4.1   Chloride 98 - 111 mmol/L 97   CO2 22 - 32 mmol/L 27   Calcium  8.9 - 10.3 mg/dL 8.5     [name], DO 01/25/2024, 7:24 AM  PGY-1, [facility] FPTS Intern pager: [phone], text pages welcome Secure chat group [facility] Teaching Service

## 2024-01-26 DIAGNOSIS — N12 Tubulo-interstitial nephritis, not specified as acute or chronic: Secondary | ICD-10-CM | POA: Diagnosis not present

## 2024-01-26 DIAGNOSIS — F5102 Adjustment insomnia: Secondary | ICD-10-CM | POA: Insufficient documentation

## 2024-01-26 LAB — BASIC METABOLIC PANEL WITH GFR
Anion gap: 6 (ref 5–15)
BUN: 8 mg/dL (ref 6–20)
CO2: 29 mmol/L (ref 22–32)
Calcium: 8.6 mg/dL — ABNORMAL LOW (ref 8.9–10.3)
Chloride: 99 mmol/L (ref 98–111)
Creatinine, Ser: 0.53 mg/dL (ref 0.44–1.00)
GFR, Estimated: >60 mL/min (ref >60)
Glucose, Bld: 236 mg/dL — ABNORMAL HIGH (ref 70–99)
Potassium: 3.9 mmol/L (ref 3.5–5.1)
Sodium: 134 mmol/L — ABNORMAL LOW (ref 135–145)

## 2024-01-26 LAB — MAGNESIUM: Magnesium: 1.5 mg/dL — ABNORMAL LOW (ref 1.7–2.4)

## 2024-01-26 LAB — GLUCOSE, CAPILLARY
Glucose-Capillary: 282 mg/dL — ABNORMAL HIGH (ref 70–99)
Glucose-Capillary: 198 mg/dL — ABNORMAL HIGH (ref 70–99)
Glucose-Capillary: 230 mg/dL — ABNORMAL HIGH (ref 70–99)
Glucose-Capillary: 351 mg/dL — ABNORMAL HIGH (ref 70–99)

## 2024-01-26 LAB — HEMOGLOBIN A1C
Hgb A1c MFr Bld: 14.3 % — ABNORMAL HIGH (ref 4.8–5.6)
Mean Plasma Glucose: 364 mg/dL

## 2024-01-26 LAB — PHOSPHORUS: Phosphorus: 3.4 mg/dL (ref 2.5–4.6)

## 2024-01-26 MED ORDER — POLYETHYLENE GLYCOL 3350 17 G PO PACK
17.0000 g | PACK | Freq: Every day | ORAL | Status: DC
  Administered 2024-01-26 – 2024-01-27 (×2): 17 g via ORAL
  Filled 2024-01-26 (×2): qty 1

## 2024-01-26 MED ORDER — CEFADROXIL 500 MG PO CAPS
1000.0000 mg | ORAL_CAPSULE | Freq: Two times a day (BID) | ORAL | Status: AC
  Administered 2024-01-26 – 2024-01-30 (×10): 1000 mg via ORAL
  Filled 2024-01-26 (×10): qty 2

## 2024-01-26 MED ORDER — ENSURE PLUS HIGH PROTEIN PO LIQD
237.0000 mL | Freq: Two times a day (BID) | ORAL | Status: DC
  Administered 2024-01-27 – 2024-01-29 (×5): 237 mL via ORAL

## 2024-01-26 MED ORDER — INSULIN GLARGINE-YFGN 100 UNIT/ML ~~LOC~~ SOLN
25.0000 [IU] | SUBCUTANEOUS | Status: DC
  Administered 2024-01-26 – 2024-01-27 (×2): 25 [IU] via SUBCUTANEOUS
  Filled 2024-01-26 (×2): qty 0.25

## 2024-01-26 MED ORDER — MELATONIN 3 MG PO TABS
3.0000 mg | ORAL_TABLET | Freq: Every day | ORAL | Status: DC
  Administered 2024-01-26 – 2024-02-15 (×24): 3 mg via ORAL
  Filled 2024-01-26 (×21): qty 1

## 2024-01-26 MED ORDER — NUTRISOURCE FIBER PO PACK
1.0000 | PACK | Freq: Every day | ORAL | Status: DC
  Administered 2024-01-26 – 2024-01-27 (×2): 1 via ORAL
  Filled 2024-01-26 (×2): qty 1

## 2024-01-26 MED ORDER — DULOXETINE HCL 60 MG PO CPEP
60.0000 mg | ORAL_CAPSULE | Freq: Every day | ORAL | Status: DC
  Administered 2024-01-26 – 2024-02-16 (×25): 60 mg via ORAL
  Filled 2024-01-26 (×22): qty 1

## 2024-01-26 MED ORDER — VITAMIN C 500 MG PO TABS
500.0000 mg | ORAL_TABLET | Freq: Every day | ORAL | Status: DC
  Administered 2024-01-26 – 2024-02-16 (×25): 500 mg via ORAL
  Filled 2024-01-26 (×22): qty 1

## 2024-01-26 MED ORDER — JUVEN PO PACK
1.0000 | PACK | Freq: Two times a day (BID) | ORAL | Status: DC
  Administered 2024-01-26 – 2024-02-16 (×40): 1 via ORAL
  Filled 2024-01-26 (×36): qty 1

## 2024-01-26 MED ORDER — MAGNESIUM SULFATE 2 GM/50ML IV SOLN
2.0000 g | Freq: Once | INTRAVENOUS | Status: AC
  Administered 2024-01-26: 2 g via INTRAVENOUS
  Filled 2024-01-26: qty 50

## 2024-01-26 MED ORDER — INSULIN ASPART 100 UNIT/ML IJ SOLN
0.0000 [IU] | Freq: Three times a day (TID) | INTRAMUSCULAR | Status: DC
  Administered 2024-01-26: 7 [IU] via SUBCUTANEOUS
  Administered 2024-01-26: 11 [IU] via SUBCUTANEOUS
  Administered 2024-01-26: 4 [IU] via SUBCUTANEOUS
  Administered 2024-01-27: 7 [IU] via SUBCUTANEOUS
  Administered 2024-01-27: 11 [IU] via SUBCUTANEOUS
  Administered 2024-01-27: 7 [IU] via SUBCUTANEOUS
  Administered 2024-01-28: 4 [IU] via SUBCUTANEOUS
  Administered 2024-01-28: 7 [IU] via SUBCUTANEOUS
  Administered 2024-01-29 (×2): 3 [IU] via SUBCUTANEOUS
  Administered 2024-01-29: 7 [IU] via SUBCUTANEOUS
  Administered 2024-01-30 (×2): 4 [IU] via SUBCUTANEOUS
  Administered 2024-01-30 (×2): 7 [IU] via SUBCUTANEOUS
  Administered 2024-01-31 (×2): 20 [IU] via SUBCUTANEOUS
  Administered 2024-01-31: 15 [IU] via SUBCUTANEOUS
  Administered 2024-01-31 (×2): 4 [IU] via SUBCUTANEOUS
  Administered 2024-01-31: 15 [IU] via SUBCUTANEOUS
  Administered 2024-02-01: 20 [IU] via SUBCUTANEOUS
  Administered 2024-02-01 (×2): 11 [IU] via SUBCUTANEOUS
  Administered 2024-02-01: 20 [IU] via SUBCUTANEOUS
  Administered 2024-02-01 (×2): 11 [IU] via SUBCUTANEOUS
  Administered 2024-02-02: 3 [IU] via SUBCUTANEOUS
  Administered 2024-02-02 – 2024-02-03 (×3): 7 [IU] via SUBCUTANEOUS
  Administered 2024-02-03: 11 [IU] via SUBCUTANEOUS
  Administered 2024-02-03: 15 [IU] via SUBCUTANEOUS
  Administered 2024-02-04: 11 [IU] via SUBCUTANEOUS
  Administered 2024-02-04: 3 [IU] via SUBCUTANEOUS
  Administered 2024-02-05: 4 [IU] via SUBCUTANEOUS
  Administered 2024-02-05: 15 [IU] via SUBCUTANEOUS
  Administered 2024-02-06: 4 [IU] via SUBCUTANEOUS
  Administered 2024-02-06: 7 [IU] via SUBCUTANEOUS
  Administered 2024-02-06: 15 [IU] via SUBCUTANEOUS
  Administered 2024-02-07: 11 [IU] via SUBCUTANEOUS
  Administered 2024-02-07: 15 [IU] via SUBCUTANEOUS
  Administered 2024-02-08: 11 [IU] via SUBCUTANEOUS
  Administered 2024-02-08: 20 [IU] via SUBCUTANEOUS
  Administered 2024-02-09: 15 [IU] via SUBCUTANEOUS
  Administered 2024-02-09: 3 [IU] via SUBCUTANEOUS
  Administered 2024-02-10: 15 [IU] via SUBCUTANEOUS
  Administered 2024-02-10: 4 [IU] via SUBCUTANEOUS
  Administered 2024-02-10: 3 [IU] via SUBCUTANEOUS
  Administered 2024-02-11: 20 [IU] via SUBCUTANEOUS
  Administered 2024-02-11: 15 [IU] via SUBCUTANEOUS
  Administered 2024-02-12: 20 [IU] via SUBCUTANEOUS
  Administered 2024-02-12: 7 [IU] via SUBCUTANEOUS
  Administered 2024-02-13 (×2): 4 [IU] via SUBCUTANEOUS
  Administered 2024-02-13: 7 [IU] via SUBCUTANEOUS
  Administered 2024-02-14: 20 [IU] via SUBCUTANEOUS
  Administered 2024-02-14: 4 [IU] via SUBCUTANEOUS
  Administered 2024-02-15: 15 [IU] via SUBCUTANEOUS
  Administered 2024-02-15: 3 [IU] via SUBCUTANEOUS
  Administered 2024-02-16: 15 [IU] via SUBCUTANEOUS

## 2024-01-26 MED ORDER — INSULIN ASPART 100 UNIT/ML IJ SOLN
0.0000 [IU] | Freq: Every day | INTRAMUSCULAR | Status: DC
  Administered 2024-01-26: 5 [IU] via SUBCUTANEOUS
  Administered 2024-01-27 – 2024-01-28 (×2): 4 [IU] via SUBCUTANEOUS
  Administered 2024-01-30 – 2024-02-03 (×8): 3 [IU] via SUBCUTANEOUS
  Administered 2024-02-04 – 2024-02-06 (×2): 2 [IU] via SUBCUTANEOUS
  Administered 2024-02-08: 3 [IU] via SUBCUTANEOUS
  Administered 2024-02-10: 2 [IU] via SUBCUTANEOUS
  Administered 2024-02-13: 3 [IU] via SUBCUTANEOUS

## 2024-01-26 NOTE — Assessment & Plan Note (Addendum)
 Reports drinking 40 ounces of beer daily, her last drink was this morning.  Current withdrawal symptoms include mild anxiety and agitation. No hx DT/withdrawal seizures - continue CIWA protocol for next 24-48h  -Vitamin B 100 mg daily -Folic acid  1 mg daily

## 2024-01-26 NOTE — Assessment & Plan Note (Addendum)
 Suspected due to CT abdomen/pelvis showing multifocal wedge-shaped areas of decreased parenchymal enhancement suspicious for pyelonephritis. UA with budding yeast, small bacteria and small leukocytes.  Reporting dysuria and some increased frequency, currently afebrile and denying flank pain. -- Continue Rocephin  2 g IV daily (day 3) switch to oral antibiotics tomorrow - Urine cultures ordered  -f/u urine and blood cultures -no growth 2 days

## 2024-01-26 NOTE — Assessment & Plan Note (Addendum)
 Patient reports chronic low back pain, denying new onset pain since the fall Continue Tylenol  to 1000 mg every 6h as needed.  Today patient is complaining of right sided back pain - Continue ibuprofin 400mg  q6 PRN - Continue low dose pain regimen oxycodone  2.5 mg q. 6 as needed -PT/OT ordered to evaluate-recommending skilled nursing-short-term rehab

## 2024-01-26 NOTE — NC FL2 (Signed)
 [place]  MEDICAID FL2 LEVEL OF CARE FORM     IDENTIFICATION  Patient Name: Nancy [name] Birthdate: 11/27/1965 Sex: female Admission Date (Current Location): 01/24/2024  [facility] and [place] Number:  Producer, television/film/video and Address:  [facility], [address] [place], [place] [address]      Provider Number: [id number]  Attending Physician Name and Address:  Donah Laymon PARAS, MD  Relative Name and Phone Number:       Current Level of Care: Hospital Recommended Level of Care: Skilled Nursing Facility Prior Approval Number:    Date Approved/Denied:   PASRR Number: [id number]  Discharge Plan: SNF    Current Diagnoses: Patient Active Problem List   Diagnosis Date Noted   Insomnia due to psychological stress 01/26/2024   Pressure ulcer 01/25/2024   Type 2 diabetes mellitus with diabetic neuropathy, unspecified (HCC) 01/25/2024   Type 2 diabetes mellitus with hyperglycemia (HCC) 01/24/2024   Alcohol use disorder 01/24/2024   Pyelonephritis 01/24/2024   Hypotension 01/24/2024   Low back pain 01/24/2024   Fall 01/24/2024   OA (osteoarthritis) of hip 01/24/2024   Diarrhea 01/24/2024   Substance abuse (HCC) 01/24/2024   Protein-calorie malnutrition, severe 10/27/2023   DKA, type 1 (HCC) 10/26/2023   DKA (diabetic ketoacidosis) (HCC) 10/26/2023   Osteoarthritis 10/26/2023   Hypokalemia 10/26/2023   Normocytic anemia 10/26/2023   Cellulitis and abscess of foot, except toes 04/07/2021   Pain due to onychomycosis of toenails of both feet 08/08/2019   Diabetes mellitus without complication (HCC) 08/08/2019   Unilateral primary osteoarthritis, left hip 08/03/2017   Unilateral primary osteoarthritis, right hip 08/03/2017   DMII (diabetes mellitus, type 2) (HCC) 07/29/2017   Primary osteoarthritis of left hip 02/10/2016   Asthma 12/31/2015   Hyperlipidemia 10/30/2015   Depression with anxiety 10/29/2015   Polysubstance abuse (HCC) 09/04/2015    Depression 09/04/2015   Osteoarthritis of right hip 09/04/2015   Alcohol abuse 12/05/2013   S/P alcohol detoxification 12/05/2013   Carcinoma in situ of cervix 11/24/2011   BV (bacterial vaginosis) 11/24/2011   ASCUS with positive high risk HPV 11/08/2011    Orientation RESPIRATION BLADDER Height & Weight     Self, Time, Place, Situation  Normal Continent Weight: 165 lb 5.5 oz (75 kg) Height:  5' 9 (175.3 cm)  BEHAVIORAL SYMPTOMS/MOOD NEUROLOGICAL BOWEL NUTRITION STATUS      Continent Diet (See DC Summary)  AMBULATORY STATUS COMMUNICATION OF NEEDS Skin   Limited Assist Verbally Other (Comment) (Pressure injury on buttocks)                       Personal Care Assistance Level of Assistance  Bathing, Dressing, Feeding Bathing Assistance: Limited assistance Feeding assistance: Limited assistance Dressing Assistance: Limited assistance     Functional Limitations Info  Sight, Hearing, Speech Sight Info: Adequate Hearing Info: Adequate Speech Info: Adequate    SPECIAL CARE FACTORS FREQUENCY  OT (By licensed OT), PT (By licensed PT)     PT Frequency: 5x/week OT Frequency: 5x/week            Contractures Contractures Info: Not present    Additional Factors Info  Code Status, Allergies Code Status Info: Full Allergies Info: Glucophage  (Metformin )           Current Medications (01/26/2024):  This is the current hospital active medication list Current Facility-Administered Medications  Medication Dose Route Frequency Provider Last Rate Last Admin   acetaminophen  (TYLENOL ) tablet 1,000 mg  1,000 mg Oral Q6H Howell Lunger, DO   1,000 mg at 01/26/24 1325   Or   acetaminophen  (TYLENOL ) suppository 650 mg  650 mg Rectal Q6H Howell Lunger, DO       atorvastatin  (LIPITOR) tablet 40 mg  40 mg Oral Daily Everhart, Kirstie, DO   40 mg at 01/26/24 0958   cefadroxil  (DURICEF) capsule 1,000 mg  1,000 mg Oral BID Everhart, Kirstie, DO   1,000 mg at 01/26/24 1325    DULoxetine  (CYMBALTA ) DR capsule 60 mg  60 mg Oral Daily Faunce, Alina, DO   60 mg at 01/26/24 0958   enoxaparin  (LOVENOX ) injection 40 mg  40 mg Subcutaneous Q24H Faunce, Alina, DO   40 mg at 01/25/24 2112   fiber (NUTRISOURCE FIBER) 1 packet  1 packet Oral Daily Everhart, Kirstie, DO   1 packet at 01/26/24 1325   folic acid  (FOLVITE ) tablet 1 mg  1 mg Oral Daily Faunce, Alina, DO   1 mg at 01/26/24 9041   Gerhardt's butt cream   Topical BID Pray, Margaret E, MD   1 Application at 01/26/24 0957   ibuprofen  (ADVIL ) tablet 400 mg  400 mg Oral Q6H Howell Lunger, DO   400 mg at 01/26/24 1325   insulin  aspart (novoLOG ) injection 0-20 Units  0-20 Units Subcutaneous TID WC Faunce, Alina, DO   4 Units at 01/26/24 1326   insulin  aspart (novoLOG ) injection 0-5 Units  0-5 Units Subcutaneous QHS Faunce, Alina, DO       insulin  glargine-yfgn (SEMGLEE ) injection 25 Units  25 Units Subcutaneous Q24H Faunce, Alina, DO       lactated ringers  infusion   Intravenous Continuous Doretha Folks, MD 125 mL/hr at 01/25/24 1402 New Bag at 01/25/24 1402   leptospermum manuka honey (MEDIHONEY) paste 1 Application  1 Application Topical Daily Pray, Margaret E, MD   1 Application at 01/26/24 0957   melatonin tablet 3 mg  3 mg Oral QHS Everhart, Kirstie, DO       multivitamin with minerals tablet 1 tablet  1 tablet Oral Daily Faunce, Alina, DO   1 tablet at 01/26/24 0958   nicotine  (NICODERM CQ  - dosed in mg/24 hours) patch 21 mg  21 mg Transdermal Daily Faunce, Alina, DO   21 mg at 01/26/24 9040   nicotine  polacrilex (NICORETTE ) gum 2 mg  2 mg Oral PRN Faunce, Alina, DO   2 mg at 01/26/24 1004   nutrition supplement (JUVEN) (JUVEN) powder packet 1 packet  1 packet Oral BID BM McIntyre, Brittany J, MD   1 packet at 01/26/24 1325   ondansetron  (ZOFRAN -ODT) disintegrating tablet 4 mg  4 mg Oral Q8H PRN Lupie Credit, DO   4 mg at 01/26/24 0415   oxyCODONE  (Oxy IR/ROXICODONE ) immediate release tablet 2.5 mg  2.5 mg Oral Q6H  PRN Bronson, Martin, DO   2.5 mg at 01/26/24 1009   thiamine  (VITAMIN B1) tablet 100 mg  100 mg Oral Daily Faunce, Alina, DO   100 mg at 01/26/24 9041     Discharge Medications: Please see discharge summary for a list of discharge medications.  Relevant Imaging Results:  Relevant Lab Results:   Additional Information SSN: [id number]  Jeoffrey LITTIE Moose, LCSW

## 2024-01-26 NOTE — Assessment & Plan Note (Addendum)
 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 Reassuringly not having pain over her hip so will not further investigate this inpatient.  - Continue Tylenol  to 1000 mg every 6h as needed - Continue ibuprofin 400mg  q6 PRN - Continue low dose pain regimen oxycodone  2.5 mg q. 6 as needed -PT/OT ordered to evaluate -recommending skilled nursing-short-term rehab

## 2024-01-26 NOTE — Assessment & Plan Note (Signed)
 Patient is saying she feels restless at night and would like something to help her rest -Start melatonin at bedtime

## 2024-01-26 NOTE — Inpatient Diabetes Management (Signed)
 Inpatient Diabetes Program Recommendations  AACE/ADA: New Consensus Statement on Inpatient Glycemic Control (2015)  Target Ranges:  Prepandial:   less than 140 mg/dL      Peak postprandial:   less than 180 mg/dL (1-2 hours)      Critically ill patients:  140 - 180 mg/dL   Lab Results  Component Value Date   GLUCAP 198 (H) 01/26/2024   HGBA1C 14.3 (H) 01/24/2024    Review of Glycemic Control  Diabetes history: DM 2 Outpatient Diabetes medications: Lantus  20 units, Novolog  0-18 units tid, Amaryl  2 mg Daily, Metformin  1000 mg bid Current orders for Inpatient glycemic control:  Semglee  25 units Novolog  0-20 units + hs  A1c 14.3% on 8/5  Spoke with pt at bedside regarding A1c level and glucose control at home. Pt reports being prescribed her home regimen however does not take it all the time. Pt also reports having bouts of time where she does not want to take care of herself due to depression. Encouraged pt to take her medications so she feels better and to seek help from her doctor regarding the depression so she is better able to care for herself. Discussed current A1c, reviewed glucose and A1c goals. Pt will need follow up.  Thanks,  Clotilda Bull RN, MSN, BC-ADM Inpatient Diabetes Coordinator Team Pager [phone] (8a-5p)

## 2024-01-26 NOTE — Assessment & Plan Note (Addendum)
 Patient states she tripped and recliner fell on top of her.  She denies any loss of consciousness, suspect this fall is not pathologic, patient has been endorsing diarrhea for the last 3 weeks and poor p.o. intake which could have contributes to generalized weakness. Pt endorses hitting her head. CT abdomen and pelvis, CT head, CT C-spine negative for acute abnormality. -- EKG: Normal sinus rhythm - Ordered orthostatic vital signs -not resulted -- Fall precautions

## 2024-01-26 NOTE — Assessment & Plan Note (Addendum)
 Admits to daily cocaine use - TOC consult for substance use counseling - UDS pending - Continue nicotine  patch to 21 mg daily -Nicotine  gum 4 mg as needed

## 2024-01-26 NOTE — TOC CAGE-AID Note (Signed)
 Transition of Care [facility]) - CAGE-AID Screening   Patient Details  Name: Nancy [name] MRN: [id number] Date of Birth: January 28, 1966  [facility] CM/SW Contact:    Mainor Hellmann E Keanthony Poole, LCSW Phone Number: 01/26/2024, 10:09 AM   Clinical Narrative: Patient reports using alcohol, cocaine, and marijuana daily. Patient states she plans to quit. SA resources were provided by ED SW.   CAGE-AID Screening:    Have You Ever Felt You Ought to Cut Down on Your Drinking or Drug Use?: Yes Have People Annoyed You By Critizing Your Drinking Or Drug Use?: Yes Have You Felt Bad Or Guilty About Your Drinking Or Drug Use?: Yes Have You Ever Had a Drink or Used Drugs First Thing In The Morning to Steady Your Nerves or to Get Rid of a Hangover?: Yes CAGE-AID Score: 4  Substance Abuse Education Offered: Yes

## 2024-01-26 NOTE — Evaluation (Signed)
 Occupational Therapy Evaluation Patient Details Name: Nancy [name] MRN: [id number] DOB: February 01, 1966 Today's Date: 01/26/2024   History of Present Illness   58 y.o. female presents to [facility] 01/24/24 with multiple falls and hypotension. On CIWA protocol with sepsis 2/2 pyelonephritis. PMH of T2DM, GERD, alcohol use disorder     Clinical Impressions At baseline, pt completes ADLs with Mod I and functional mobility Mod I with crutches or a RW. However, pt has had several recent falls and reports recent increased difficulty with LB ADLs. Pt now presents with decreased activity tolerance, decreased B UE strength, decreased sensation and increased pain in B LE affecting functional level, and decreased safety and independence with functional tasks. Pt currently demonstrates ability to largely complete ADLs with Set up to Mod assist and functional mobility/transfers with a RW with Contact guard to Min assist. Pt participated well in session but was limited by fatigue and pain. Pt will benefit from acute skilled OT services to address deficits and increase safety and independence with functional tasks. Post acute discharge, pt will benefit from intensive inpatient skilled rehab services < 3 hours per day to maximize rehab potential.      If plan is discharge home, recommend the following:   A little help with walking and/or transfers;A lot of help with bathing/dressing/bathroom;Assistance with cooking/housework;Assist for transportation;Help with stairs or ramp for entrance     Functional Status Assessment   Patient has had a recent decline in their functional status and demonstrates the ability to make significant improvements in function in a reasonable and predictable amount of time.     Equipment Recommendations   Other (comment) (OT provided reacher and long handled sponge this session; other TBD based on pt progress)     Recommendations for Other Services          Precautions/Restrictions   Precautions Precautions: Fall, Other (comment) Precautions/Restrictions Comments: watch BP Recall of Precautions/Restrictions: Intact Restrictions Weight Bearing Restrictions Per Provider Order: No     Mobility Bed Mobility Overal bed mobility: Needs Assistance Bed Mobility: Supine to Sit, Sit to Supine     Supine to sit: Min assist Sit to supine: Contact guard assist, Min assist        Transfers Overall transfer level: Needs assistance Equipment used: Rolling walker (2 wheels) Transfers: Sit to/from Stand, Bed to chair/wheelchair/BSC Sit to Stand: Min assist, Contact guard assist     Step pivot transfers: Contact guard assist     General transfer comment: cues for hand placement/technique      Balance Overall balance assessment: Mild deficits observed, not formally tested, History of Falls                                         ADL either performed or assessed with clinical judgement   ADL Overall ADL's : Needs assistance/impaired Eating/Feeding: Set up;Sitting   Grooming: Set up;Sitting   Upper Body Bathing: Set up;Contact guard assist;Sitting   Lower Body Bathing: Moderate assistance;Cueing for compensatory techniques;Sit to/from stand;Sitting/lateral leans   Upper Body Dressing : Contact guard assist;Sitting   Lower Body Dressing: Moderate assistance;Sitting/lateral leans;Sit to/from stand;Cueing for compensatory techniques   Toilet Transfer: Minimal assistance;Contact guard assist;Rolling walker (2 wheels);Regular Toilet;Grab bars   Toileting- Clothing Manipulation and Hygiene: Contact guard assist;Minimal assistance;Sitting/lateral lean;Sit to/from stand       Functional mobility during ADLs: Contact guard assist;Minimal assistance;Rolling walker (2 wheels) General  ADL Comments: Pt with decreased activity tolerance and pain affecting functional level     Vision Patient Visual Report: No change  from baseline Vision Assessment?: No apparent visual deficits Additional Comments: Vision [facility] for tasks assessed; not formally screened or evaluated     Perception         Praxis         Pertinent Vitals/Pain Pain Assessment Pain Assessment: Faces Faces Pain Scale: Hurts even more Pain Location: generalized; worse in B hips and B feet Pain Descriptors / Indicators: Aching, Discomfort, Grimacing, Burning, Numbness, Constant, Other (Comment) (coldness; Pt reports constat numbness, coldness, and buring sensation in B feet and aching and discomfort in B hips) Pain Intervention(s): Limited activity within patient's tolerance, Monitored during session, Repositioned     Extremity/Trunk Assessment Upper Extremity Assessment Upper Extremity Assessment: Right hand dominant;Generalized weakness;RUE deficits/detail;LUE deficits/detail RUE Deficits / Details: generalized weakness LUE Deficits / Details: generalized weakness; decreased fine motor coordination LUE Coordination: decreased fine motor   Lower Extremity Assessment Lower Extremity Assessment: Defer to PT evaluation   Cervical / Trunk Assessment Cervical / Trunk Assessment: Normal   Communication Communication Communication: No apparent difficulties   Cognition Arousal: Alert Behavior During Therapy: WFL for tasks assessed/performed, Impulsive Cognition: Cognition impaired, No family/caregiver present to determine baseline     Awareness: Intellectual awareness intact, Online awareness intact (online awareness intermittently intact)   Attention impairment (select first level of impairment): Divided attention Executive functioning impairment (select all impairments): Problem solving OT - Cognition Comments: AAOx4 and pleasant throughout session. Cogntiion largely [facility] for tasks assessed. However, pt with noted occasional impulsiveness with movement and decreased safety awareness and problem solving.                  Following commands: Intact       Cueing  General Comments   Cueing Techniques: Verbal cues  OT provided and trained pt in use of long-handled sponge and reachers for LB bathing this session. Pt request deferring educaiton on AE for LB dressing until next session due to fatigue.   Exercises     Shoulder Instructions      Home Living Family/patient expects to be discharged to:: Unsure Living Arrangements: Alone                               Additional Comments: Was staying with boyfriend who passed away. Is not going to be able to return to that house. No friends/family to help or offer housing      Prior Functioning/Environment Prior Level of Function : Needs assist;History of Falls (last six months)             Mobility Comments: ModI with crutches or RW. 12-15 falls in past few months due to legs giving out or tripping over obstacles ADLs Comments: Pt completing ADLs Mod I due to lack of assistance, but reports being unable to wash/dry her feet or donn/doffs pants,shoes, or socks for several weeks. Pt has been largely wearing gowns/dresses she can put over her head and slip on shoes.    OT Problem List: Decreased strength;Decreased activity tolerance;Impaired balance (sitting and/or standing);Decreased coordination;Decreased safety awareness;Decreased knowledge of use of DME or AE;Decreased knowledge of precautions;Pain   OT Treatment/Interventions: Self-care/ADL training;Therapeutic exercise;Energy conservation;DME and/or AE instruction;Therapeutic activities;Patient/family education;Balance training      OT Goals(Current goals can be found in the care plan section)   Acute Rehab OT Goals Patient Stated  Goal: to be more independent, feel better, and stop falling OT Goal Formulation: With patient Time For Goal Achievement: 02/09/24 Potential to Achieve Goals: Good ADL Goals Pt Will Perform Grooming: with modified independence;standing Pt Will Perform  Lower Body Bathing: with supervision;sitting/lateral leans;sit to/from stand;with adaptive equipment Pt Will Perform Lower Body Dressing: with supervision;with adaptive equipment;sitting/lateral leans;sit to/from stand Pt Will Transfer to Toilet: with modified independence;ambulating;regular height toilet (with least restrictive AD) Pt Will Perform Toileting - Clothing Manipulation and hygiene: with modified independence;sit to/from stand;sitting/lateral leans Pt/caregiver will Perform Home Exercise Program: Increased strength;Both right and left upper extremity;With theraband;With theraputty;Independently;With written HEP provided (Increased Left fine motor coordiantion; Increased activity tolerance)   OT Frequency:  Min 2X/week    Co-evaluation              AM-PAC OT 6 Clicks Daily Activity     Outcome Measure Help from another person eating meals?: A Little Help from another person taking care of personal grooming?: A Little Help from another person toileting, which includes using toliet, bedpan, or urinal?: A Little Help from another person bathing (including washing, rinsing, drying)?: A Lot Help from another person to put on and taking off regular upper body clothing?: A Little Help from another person to put on and taking off regular lower body clothing?: A Lot 6 Click Score: 16   End of Session Equipment Utilized During Treatment: Rolling walker (2 wheels);Other (comment);Gait belt (long handled sponge, reacher) Nurse Communication: Mobility status  Activity Tolerance: Patient tolerated treatment well;Patient limited by fatigue;Patient limited by pain Patient left: in bed;with call bell/phone within reach;with bed alarm set  OT Visit Diagnosis: Unsteadiness on feet (R26.81);Repeated falls (R29.6);History of falling (Z91.81);Muscle weakness (generalized) (M62.81);Pain                Time: 8365-8346 OT Time Calculation (min): 19 min Charges:  OT General Charges $OT Visit:  1 Visit OT Evaluation $OT Eval Moderate Complexity: 1 Mod  Margarie Rockey HERO., OTR/L, MA Acute Rehab [phone]   Margarie FORBES Horns 01/26/2024, 7:18 PM

## 2024-01-26 NOTE — Progress Notes (Addendum)
 Initial Nutrition Assessment  DOCUMENTATION CODES:   Severe malnutrition in context of social or environmental circumstances  INTERVENTION:  -Continue CM menu, regular texture, thin liquids -Add Ensure Plus High Protein BID -Add Juven BID -Continue MVI, Folic acid , Thiamine  and Vit C -Daily weights -Continue to monitor phos, mg for refeeding risk -Recommend outpatient DM education -Discussed importance of adequate kcal/pro intake for healing, maintaining function, providing the body energy.    NUTRITION DIAGNOSIS:   Severe Malnutrition related to chronic illness, social / environmental circumstances as evidenced by energy intake < 75% for > or equal to 3 months, severe muscle depletion, moderate fat depletion.  GOAL:   Patient will meet greater than or equal to 90% of their needs   MONITOR:   PO intake, Weight trends, Supplement acceptance, Labs, Skin  REASON FOR ASSESSMENT:   Consult Assessment of nutrition requirement/status, Diet education, Wound healing  ASSESSMENT:   Hx DM II, tobacco use, alcohol use, depression, GERD, chronic bilateral hip pain, frequent falls. Pt's roommate called EMS because they had been drinking all night and patient had had multiple falls. Last 3 weeks she has been having diarrhea more than 10 times a day per pt. She has also had some intermittent vomiting. She is complaining of right lower quadrant pain as well that has been associated with the diarrhea.  She cannot remember the last time she had anything to eat or drink. She states she feels like she is going through withdrawal but did admit to drinking last night.  She also reports she has been having a lot of migraine headaches.  Pt typically drinks 40oz beer and does cocaine daily. Also uses marijuana. Pt has hx of DKA, most recent A1c 14.3. Spoke to pt at bedside. Pt does endorse some nausea, diarrhea x 3 wks, constipation currently. No chewing/swallowing difficulties. Last BM 8/6. Discussed  importance of adequate kcal/pro intake for healing, maintaining function, providing the body energy. Encourage adequate kcal/pro intake here and at home. Discussed ONS options to keep at home for convenient PO intake. Pt states she is sometimes too unstable and weak to cook. Pt is going to SNF for rehab to improve mobility. NFPE completed (see below), pt meets criteria for severe protein calorie malnutrition. Pt with good appetite, documented meal intake 100%. Suspect social/environmental circumstances impacting PO intake.  Pt with documented weight loss of 41 lbs long term (12 yrs)? Pt states she has lost 119 lbs in a little over a year? Asked nursing to confirm current weight as I would estimated her weight lower than currently documented. Plan to d/c tomorrow to SNF. Pt denies additional questions/concerns at this time, will continue to monitor, RDN available prn.   Edit: New weight on standing scale 58.1 kg, pt confirmed accuracy. Documented long term weight loss of 79 lbs. Weight loss over last year 37 lbs, which is 22.5%-Severe. Continue with current interventions.   Labs Na 134 BG 198-298 Calcium  8.6 Mg 1.5 Albumin 3.1 A1c 14.3   Medications  acetaminophen   1,000 mg Oral Q6H   Or   acetaminophen   650 mg Rectal Q6H   atorvastatin   40 mg Oral Daily   cefadroxil   1,000 mg Oral BID   DULoxetine   60 mg Oral Daily   enoxaparin  (LOVENOX ) injection  40 mg Subcutaneous Q24H   fiber  1 packet Oral Daily   folic acid   1 mg Oral Daily   Gerhardt's butt cream   Topical BID   ibuprofen   400 mg Oral Q6H  insulin  aspart  0-20 Units Subcutaneous TID WC   insulin  aspart  0-5 Units Subcutaneous QHS   insulin  glargine-yfgn  25 Units Subcutaneous Q24H   leptospermum manuka honey  1 Application Topical Daily   melatonin  3 mg Oral QHS   multivitamin with minerals  1 tablet Oral Daily   nicotine   21 mg Transdermal Daily   nutrition supplement (JUVEN)  1 packet Oral BID BM   polyethylene glycol  17  g Oral Daily   thiamine   100 mg Oral Daily      NUTRITION - FOCUSED PHYSICAL EXAM:  Flowsheet Row Most Recent Value  Orbital Region Moderate depletion  Upper Arm Region Mild depletion  Thoracic and Lumbar Region Severe depletion  Buccal Region Moderate depletion  Temple Region Severe depletion  Clavicle Bone Region Severe depletion  Clavicle and Acromion Bone Region Severe depletion  Scapular Bone Region Severe depletion  Dorsal Hand Moderate depletion  Patellar Region Severe depletion  Anterior Thigh Region Severe depletion  Posterior Calf Region Severe depletion  Edema (RD Assessment) None  Hair Reviewed  Eyes Reviewed  Mouth Reviewed  Skin Reviewed  Nails Reviewed    Diet Order:   Diet Order             Diet Carb Modified Fluid consistency: Thin; Room service appropriate? Yes  Diet effective now                   EDUCATION NEEDS:   Education needs have been addressed  Skin:  Skin Assessment: Skin Integrity Issues: Skin Integrity Issues:: Stage II Stage II: R Buttocks  Last BM:  8/6  Height:   Ht Readings from Last 1 Encounters:  01/24/24 5' 9 (1.753 m)    Weight:   Wt Readings from Last 1 Encounters:  01/24/24 75 kg    BMI:  Body mass index is 24.42 kg/m.  Estimated Nutritional Needs:   Kcal:  1875-2250 kcal  Protein:  90-115 g  Fluid:  >/=2L  Nancy [name], RDN, LDN Registered Dietitian Nutritionist RD Inpatient Contact Info in [place]

## 2024-01-26 NOTE — Assessment & Plan Note (Addendum)
 Blood glucose remains elevated and poorly controlled, however improving since admission.  Of note, pt was admitted on 10/2023 for DKA and A1c at that time was 15.5. Her home regimen includes: glimepiride , 20 units long acting insulin , 0-18 units for meal coverage, metformin  1000 BID - Increase Semglee  to 25 units daily - Resistant sliding scale insulin  ordered --CBG check 4 times daily with meals, and at bedtime -AM CMP, CBC --Patient changed to carb modified diet --RD consulted --A1c: 14.3

## 2024-01-26 NOTE — Assessment & Plan Note (Addendum)
 unclear etiology, presenting for 3 weeks, but could be due to hyperglycemia, viral infection.  Suspect this is due to uncontrolled hyperglycemia. - Continue IV fluids - Monitor BM while inpatient  - Continuing to have nonbloody diarrhea, however less frequently now -- Consider colonoscopy if diarrhea does not improve with glucose control, or becomes concerning (bloody)

## 2024-01-26 NOTE — Plan of Care (Signed)
  Problem: Coping: Goal: Ability to adjust to condition or change in health will improve Outcome: Progressing   Problem: Health Behavior/Discharge Planning: Goal: Ability to identify and utilize available resources and services will improve Outcome: Progressing   Problem: Health Behavior/Discharge Planning: Goal: Ability to manage health-related needs will improve Outcome: Progressing   

## 2024-01-26 NOTE — Assessment & Plan Note (Addendum)
 Patient is still complaining of some numbness and tingling in her bilateral feet - Continue patient's Cymbalta  60 mg daily

## 2024-01-26 NOTE — Assessment & Plan Note (Addendum)
 Suspect hypotension on arrival due to hypovolemia 2/2 to 3 weeks of diarrhea, and some vomiting - Continue IVF - Ordered orthostatic vital signs -- lactic acid trending down 2.9 -> 1.9 -> 1.7

## 2024-01-26 NOTE — Progress Notes (Signed)
 Daily Progress Note Intern Pager: [phone]  Patient name: Nancy [name] Medical record number: [id number] Date of birth: September 06, 1965 Age: 58 y.o. Gender: female  Primary Care Provider: Earvin Johnston PARAS, FNP Consultants: wound care, RD Code Status: FULL  Pt Overview and Major Events to Date:  8/5: admitted, started IV Rocephin    Assessment and Plan: Nancy [name] is a 58 y.o. female presenting status post fall.  She has also been experiencing diarrhea for the past 3 weeks, and was complaining of bilateral hip pain.  She is awaiting bilateral hip replacement, however she needs her A1c under control.   Assessment & Plan Pyelonephritis Suspected due to CT abdomen/pelvis showing multifocal wedge-shaped areas of decreased parenchymal enhancement suspicious for pyelonephritis. UA with budding yeast, small bacteria and small leukocytes.  Reporting dysuria and some increased frequency, currently afebrile and denying flank pain. -- Continue Rocephin  2 g IV daily (day 3) switch to oral antibiotics tomorrow - Urine cultures ordered  -f/u urine and blood cultures -no growth 2 days Type 2 diabetes mellitus with hyperglycemia (HCC) Blood glucose remains elevated and poorly controlled, however improving since admission.  Of note, pt was admitted on 10/2023 for DKA and A1c at that time was 15.5. Her home regimen includes: glimepiride , 20 units long acting insulin , 0-18 units for meal coverage, metformin  1000 BID - Increase Semglee  to 25 units daily - Resistant sliding scale insulin  ordered --CBG check 4 times daily with meals, and at bedtime -AM CMP, CBC --Patient changed to carb modified diet --RD consulted --A1c: 14.3 Hypotension Suspect hypotension on arrival due to hypovolemia 2/2 to 3 weeks of diarrhea, and some vomiting - Continue IVF - Ordered orthostatic vital signs -- lactic acid trending down 2.9 -> 1.9 -> 1.7 Diarrhea unclear etiology, presenting for 3 weeks, but could be due to  hyperglycemia, viral infection.  Suspect this is due to uncontrolled hyperglycemia. - Continue IV fluids - Monitor BM while inpatient  - Continuing to have nonbloody diarrhea, however less frequently now -- Consider colonoscopy if diarrhea does not improve with glucose control, or becomes concerning (bloody)  Alcohol use disorder Reports drinking 40 ounces of beer daily, her last drink was this morning.  Current withdrawal symptoms include mild anxiety and agitation. No hx DT/withdrawal seizures - continue CIWA protocol for next 24-48h  -Vitamin B 100 mg daily -Folic acid  1 mg daily  Fall Patient states she tripped and recliner fell on top of her.  She denies any loss of consciousness, suspect this fall is not pathologic, patient has been endorsing diarrhea for the last 3 weeks and poor p.o. intake which could have contributes to generalized weakness. Pt endorses hitting her head. CT abdomen and pelvis, CT head, CT C-spine negative for acute abnormality. -- EKG: Normal sinus rhythm - Ordered orthostatic vital signs -not resulted -- Fall precautions OA (osteoarthritis) of hip Patient says she uses crutches at baseline, reports she has had bilateral hip replacement, but do not see this on chart review. 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 Reassuringly not having pain over her hip so will not further investigate this inpatient.  - Continue Tylenol  to 1000 mg every 6h as needed - Continue ibuprofin 400mg  q6 PRN - Continue low dose pain regimen oxycodone  2.5 mg q. 6 as needed -PT/OT ordered to evaluate -recommending skilled nursing-short-term rehab Low back pain Patient reports chronic low back pain, denying new onset pain since the fall  Continue Tylenol  to 1000 mg every 6h as needed.  Today patient is complaining of right sided back pain - Continue ibuprofin 400mg  q6 PRN - Continue low dose pain regimen  oxycodone  2.5 mg q. 6 as needed -PT/OT ordered to evaluate-recommending skilled nursing-short-term rehab Substance abuse (HCC) Admits to daily cocaine use - TOC consult for substance use counseling - UDS pending - Continue nicotine  patch to 21 mg daily -Nicotine  gum 4 mg as needed Pressure ulcer Superficial pressure ulcer right buttock - Wound care on board Type 2 diabetes mellitus with diabetic neuropathy, unspecified (HCC) Patient is still complaining of some numbness and tingling in her bilateral feet - Continue patient's Cymbalta  60 mg daily Insomnia due to psychological stress Patient is saying she feels restless at night and would like something to help her rest -Start melatonin at bedtime    Chronic and Stable Problems:  HLD: Continue Lipitor 40 milligrams daily GERD: tums prn MDD: Consider restart Lexapro  10 mg daily  FEN/GI: Carb diet PPx: Lovenox  Dispo:SNF tomorrow. Barriers include blood glucose control and diarrhea workup.   Subjective:  Patient is saying that she is constipated, and anxious.  Patient says she feels like she needs to go to the bathroom, however she says that her last bowel movement was at 2 AM, and it was loose.  Patient is denying any palpitations, diaphoresis, or tremulousness be kept on CIWA protocol until at least tomorrow.  PT has evaluated patient to meet needs for SNF, patient is agreeable to going to SNF upon discharge.  Objective: Temp:  [97.8 F (36.6 C)-98.5 F (36.9 C)] 98.1 F (36.7 C) (08/07 0521) Pulse Rate:  [63-79] 65 (08/07 0521) Resp:  [16-18] 16 (08/07 0521) BP: (96-137)/(67-86) 124/80 (08/07 0521) SpO2:  [97 %-100 %] 97 % (08/07 0521) Physical Exam: General: Patient is lying in bed eating breakfast, NAD Cardiovascular: RRR, no rubs/murmurs/gallops Respiratory: CTAB Abdomen: Soft, nontender, nondistended Extremities: Full range of motion of bilateral lower extremities, no edema present  Laboratory: Most recent CBC Lab  Results  Component Value Date   WBC 10.0 01/25/2024   HGB 13.3 01/25/2024   HCT 38.6 01/25/2024   MCV 91.7 01/25/2024   PLT 240 01/25/2024   Most recent BMP    Latest Ref Rng & Units 01/26/2024    6:12 AM  BMP  Glucose 70 - 99 mg/dL 763   BUN 6 - 20 mg/dL 8   Creatinine 9.55 - 8.99 mg/dL 9.46   Sodium 864 - 854 mmol/L 134   Potassium 3.5 - 5.1 mmol/L 3.9   Chloride 98 - 111 mmol/L 99   CO2 22 - 32 mmol/L 29   Calcium  8.9 - 10.3 mg/dL 8.6     [name], DO 01/26/2024, 7:42 AM  PGY-1, [facility] FPTS Intern pager: [phone], text pages welcome Secure chat group [facility] Teaching Service

## 2024-01-26 NOTE — TOC Progression Note (Signed)
 Transition of Care [facility]) - Progression Note    Patient Details  Name: LEYLA SOLIZ MRN: [id number] Date of Birth: [birth year]  Transition of [facility]) CM/SW Contact  Semaj Coburn LITTIE Moose, LCSW Phone Number: 01/26/2024, 3:38 PM  Clinical Narrative:    CSW completed SNF workup with pt. Pt is agreeable to SNF when medically ready to discharge. CSW completed Fl2 and sent out SNF referrals, will continue to follow and provide pt with bed offers.    Expected Discharge Plan: Skilled Nursing Facility Barriers to Discharge: Continued Medical Work up, English as a second language teacher, SNF Pending bed offer               Expected Discharge Plan and Services       Living arrangements for the past 2 months: Single Family Home                                       Social Drivers of Health (SDOH) Interventions SDOH Screenings   Food Insecurity: Food Insecurity Present (01/24/2024)  Housing: High Risk (01/24/2024)  Transportation Needs: Unmet Transportation Needs (01/24/2024)  Utilities: At Risk (01/24/2024)  Financial Resource Strain: Not on File (10/08/2021)   Received from [facility]  Physical Activity: Not on File (10/08/2021)   Received from [facility]  Social Connections: Socially Isolated (10/26/2023)  Stress: Not on File (10/08/2021)   Received from [facility]  Tobacco Use: High Risk (01/24/2024)    Readmission Risk Interventions     No data to display

## 2024-01-26 NOTE — Assessment & Plan Note (Addendum)
 Superficial pressure ulcer right buttock - Wound care on board

## 2024-01-27 DIAGNOSIS — E43 Unspecified severe protein-calorie malnutrition: Secondary | ICD-10-CM

## 2024-01-27 DIAGNOSIS — Z789 Other specified health status: Secondary | ICD-10-CM

## 2024-01-27 DIAGNOSIS — M62838 Other muscle spasm: Secondary | ICD-10-CM | POA: Diagnosis present

## 2024-01-27 DIAGNOSIS — K5900 Constipation, unspecified: Secondary | ICD-10-CM | POA: Insufficient documentation

## 2024-01-27 DIAGNOSIS — N12 Tubulo-interstitial nephritis, not specified as acute or chronic: Secondary | ICD-10-CM | POA: Diagnosis not present

## 2024-01-27 LAB — GLUCOSE, CAPILLARY
Glucose-Capillary: 250 mg/dL — ABNORMAL HIGH (ref 70–99)
Glucose-Capillary: 211 mg/dL — ABNORMAL HIGH (ref 70–99)
Glucose-Capillary: 317 mg/dL — ABNORMAL HIGH (ref 70–99)
Glucose-Capillary: 350 mg/dL — ABNORMAL HIGH (ref 70–99)

## 2024-01-27 LAB — BASIC METABOLIC PANEL WITH GFR
Anion gap: 9 (ref 5–15)
BUN: 16 mg/dL (ref 6–20)
CO2: 30 mmol/L (ref 22–32)
Calcium: 9 mg/dL (ref 8.9–10.3)
Chloride: 94 mmol/L — ABNORMAL LOW (ref 98–111)
Creatinine, Ser: 0.41 mg/dL — ABNORMAL LOW (ref 0.44–1.00)
GFR, Estimated: >60 mL/min (ref >60)
Glucose, Bld: 205 mg/dL — ABNORMAL HIGH (ref 70–99)
Potassium: 4.5 mmol/L (ref 3.5–5.1)
Sodium: 133 mmol/L — ABNORMAL LOW (ref 135–145)

## 2024-01-27 LAB — MAGNESIUM: Magnesium: 1.5 mg/dL — ABNORMAL LOW (ref 1.7–2.4)

## 2024-01-27 LAB — PHOSPHORUS: Phosphorus: 4.7 mg/dL — ABNORMAL HIGH (ref 2.5–4.6)

## 2024-01-27 MED ORDER — INSULIN GLARGINE-YFGN 100 UNIT/ML ~~LOC~~ SOLN
28.0000 [IU] | SUBCUTANEOUS | Status: DC
  Administered 2024-01-28 – 2024-01-30 (×4): 28 [IU] via SUBCUTANEOUS
  Filled 2024-01-27 (×4): qty 0.28

## 2024-01-27 MED ORDER — HYDROXYZINE HCL 25 MG PO TABS
25.0000 mg | ORAL_TABLET | Freq: Three times a day (TID) | ORAL | Status: DC | PRN
  Administered 2024-01-27 – 2024-02-16 (×34): 25 mg via ORAL
  Filled 2024-01-27 (×32): qty 1

## 2024-01-27 NOTE — Assessment & Plan Note (Addendum)
 Patient is saying she feels restless at night and would like something to help her rest - Continue melatonin at bedtime

## 2024-01-27 NOTE — Plan of Care (Signed)
  Problem: Coping: Goal: Ability to adjust to condition or change in health will improve Outcome: Progressing   Problem: Skin Integrity: Goal: Risk for impaired skin integrity will decrease Outcome: Progressing   Problem: Pain Managment: Goal: General experience of comfort will improve and/or be controlled Outcome: Progressing   Problem: Safety: Goal: Ability to remain free from injury will improve Outcome: Progressing

## 2024-01-27 NOTE — Progress Notes (Signed)
 Mobility Specialist Progress Note:    01/27/24 1235  Mobility  Activity Ambulated with assistance (From BR to bed)  Level of Assistance Contact guard assist, steadying assist  Assistive Device Front wheel walker  Distance Ambulated (ft) 10 ft  Activity Response Tolerated well  Mobility Referral Yes  Mobility visit 1 Mobility  Mobility Specialist Start Time (ACUTE ONLY) 1215  Mobility Specialist Stop Time (ACUTE ONLY) 1225  Mobility Specialist Time Calculation (min) (ACUTE ONLY) 10 min   Received pt requesting help to ambulate to bed from BR. No physical assistance needed. No c/o. Returned pt to bed with alarm on. Personal belongings and call light within reach. All needs met.  Lavanda Pollack Mobility Specialist  Please contact via [employer] or  Rehab Office [phone]

## 2024-01-27 NOTE — Assessment & Plan Note (Addendum)
 Suspected due to CT abdomen/pelvis showing multifocal wedge-shaped areas of decreased parenchymal enhancement suspicious for pyelonephritis. UA with budding yeast, small bacteria and small leukocytes.  Reporting dysuria and some increased frequency, currently afebrile and denying flank pain. -- Discontinue Rocephin  2 g IV  --Start oral cefadroxil  1 g twice daily for 4 days (day 1/4) - Urine & blood cultures ordered  - No growth on blood cultures for 2 days

## 2024-01-27 NOTE — Assessment & Plan Note (Addendum)
 Substance Abuse: TOC consult, cont nicotine  patch 21 mg daily, Nicotine  4 mg gum PRN Insomnia: cont melatonin  T2DM Neuropathy: continue Cymbalta  60 mg daily  Pressure ulcer: superficial, present on admission. Wound care management as ordered  Constipation: daily Miralax , experienced diarrhea with fiber supplements.  OA of Hip/Low back pain: uses crutches at home, cont tylenol , ibuprofen , oxy 2.5 mg PRN; add lidocaine  patch for muscle spasms

## 2024-01-27 NOTE — Progress Notes (Signed)
 Daily Progress Note Intern Pager: [phone]  Patient name: Nancy [name] Medical record number: [id number] Date of birth: 07-Jan-1966 Age: 58 y.o. Gender: female  Primary Care Provider: Earvin Johnston PARAS, FNP Consultants: None Code Status: Full  Pt Overview and Major Events to Date:  8/5: admitted, started IV Rocephin   8/8: Transition to p.o. antibiotics  Assessment and Plan: Nancy [name] is a 58 y.o. female presenting status post fall.  She has also been experiencing diarrhea for the past 3 weeks, and was complaining of bilateral hip pain.  She is awaiting bilateral hip replacement, however she needs her A1c under control.  Assessment & Plan Pyelonephritis Suspected due to CT abdomen/pelvis showing multifocal wedge-shaped areas of decreased parenchymal enhancement suspicious for pyelonephritis. UA with budding yeast, small bacteria and small leukocytes.  Reporting dysuria and some increased frequency, currently afebrile and denying flank pain. -- Discontinue Rocephin  2 g IV  --Start oral cefadroxil  1 g twice daily for 4 days (day 1/4) - Urine & blood cultures ordered  - No growth on blood cultures for 2 days   Type 2 diabetes mellitus with hyperglycemia (HCC) Blood glucose remains elevated and poorly controlled, however improving since admission.  Of note, pt was admitted on 10/2023 for DKA and A1c at that time was 15.5. Her home regimen includes: glimepiride , 20 units long acting insulin , 0-18 units for meal coverage, metformin  1000 BID - Continue Semglee  to 25 units daily, reassess tomorrow - Resistant sliding scale insulin  ordered --CBG check 4 times daily with meals, and at bedtime -AM CMP, CBC --Patient changed to carb modified diet --RD following patient --A1c: 14.3  Constipation Patient is complaining of constipation, however, she endorsed diarrhea earlier this morning.  Nursing denies patient having diarrhea, however reports regular bowel movements. -- d/c fiber -  Start daily MiraLAX  17 g Diarrhea (Resolved: 01/27/2024) unclear etiology, presenting for 3 weeks, but could be due to hyperglycemia, viral infection.  Suspect this is due to uncontrolled hyperglycemia. - Continue IV fluids - Monitor BM while inpatient  - Continuing to have nonbloody diarrhea, however less frequently now  Alcohol use disorder Reports drinking 40 ounces of beer daily, her last drink was this morning.  Current withdrawal symptoms include mild anxiety and agitation. No hx DT/withdrawal seizures - continue CIWA protocol for next 24-48h  -Vitamin B 100 mg daily -Folic acid  1 mg daily - Start hydroxyzine  25 mg 3 times daily as needed for anxiety  OA (osteoarthritis) of hip Patient says she uses crutches at baseline, reports she has had bilateral hip replacement, but do not see this on chart review. 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 Reassuringly not having pain over her hip so will not further investigate this inpatient.  - Continue Tylenol  to 1000 mg every 6h as needed - Continue ibuprofin 400mg  q6 PRN - Continue low dose pain regimen oxycodone  2.5 mg q. 6 as needed -PT/OT ordered to evaluate -recommending skilled nursing-short-term rehab Fall (Resolved: 01/27/2024) Patient states she tripped and recliner fell on top of her.  She denies any loss of consciousness, suspect this fall is not pathologic, patient has been endorsing diarrhea for the last 3 weeks and poor p.o. intake which could have contributes to generalized weakness. Pt endorses hitting her head. CT abdomen and pelvis, CT head, CT C-spine negative for acute abnormality. -- EKG: Normal sinus rhythm - Ordered orthostatic vital signs -not resulted -- Fall precautions Low back  pain Patient reports chronic low back pain, denying new onset pain since the fall Continue Tylenol  to 1000 mg every 6h as needed.  Today patient is complaining of  right sided back pain - Continue ibuprofin 400mg  q6 PRN - Continue low dose pain regimen oxycodone  2.5 mg q. 6 as needed -PT/OT ordered to evaluate-recommending skilled nursing-short-term rehab Substance abuse (HCC) Admits to daily cocaine use - TOC consult for substance use counseling - UDS pending - Continue nicotine  patch to 21 mg daily -Nicotine  gum 4 mg as needed Pressure ulcer Superficial pressure ulcer right buttock, patient says wound care is managing well - Wound care on board Type 2 diabetes mellitus with diabetic neuropathy, unspecified (HCC) Patient is still complaining of some numbness and tingling in her bilateral feet - Continue patient's Cymbalta  60 mg daily Insomnia due to psychological stress Patient is saying she feels restless at night and would like something to help her rest - Continue melatonin at bedtime Muscle spasm Patient is complaining of muscle spasms of her hips and knees.  Patient says she takes Flexeril  at home -Consulted with pharmacy, patient not taking Flexeril , and on several sedating medications, would not start additional medication for this problem.    FEN/GI: carb diet PPx: lovenox  Dispo:SNF awaiting SNF bed placement. Barriers include SNF bed placement.   Subjective:  Says she is feeling much better today.  When discussing with patient the discharge plan, patient says she is not ready for discharge.  She says she still feels weak and is not ready to leave.  Still awaiting SNF bed placement.  Patient is still complaining of ongoing chronic pain in her hips, and muscle spasm in her right knee.  Objective: Temp:  [97.5 F (36.4 C)-98.4 F (36.9 C)] 98.2 F (36.8 C) (08/08 0833) Pulse Rate:  [66-79] 66 (08/08 0833) Resp:  [16-18] 16 (08/08 0833) BP: (100-143)/(64-99) 100/64 (08/08 0833) SpO2:  [95 %-99 %] 95 % (08/08 0833) Weight:  [58.1 kg] 58.1 kg (08/07 1616) Physical Exam: General: Laying down in bed, just finished breakfast,  NAD Cardiovascular: RRR, no rub/murmur/gallop Respiratory: CTAB, no wheezes, or crackles Abdomen: Soft, nontender, nondistended Extremities: Nonedematous, full ROM  Laboratory: Most recent CBC Lab Results  Component Value Date   WBC 10.0 01/25/2024   HGB 13.3 01/25/2024   HCT 38.6 01/25/2024   MCV 91.7 01/25/2024   PLT 240 01/25/2024   Most recent BMP    Latest Ref Rng & Units 01/27/2024    7:06 AM  BMP  Glucose 70 - 99 mg/dL 794   BUN 6 - 20 mg/dL 16   Creatinine 9.55 - 1.00 mg/dL 9.58   Sodium 864 - 854 mmol/L 133   Potassium 3.5 - 5.1 mmol/L 4.5   Chloride 98 - 111 mmol/L 94   CO2 22 - 32 mmol/L 30   Calcium  8.9 - 10.3 mg/dL 9.0      [name], DO 01/27/2024, 9:42 AM  PGY-1, [facility] FPTS Intern pager: [phone], text pages welcome Secure chat group [facility] Teaching Service

## 2024-01-27 NOTE — Assessment & Plan Note (Signed)
 Patient states she tripped and recliner fell on top of her.  She denies any loss of consciousness, suspect this fall is not pathologic, patient has been endorsing diarrhea for the last 3 weeks and poor p.o. intake which could have contributes to generalized weakness. Pt endorses hitting her head. CT abdomen and pelvis, CT head, CT C-spine negative for acute abnormality. -- EKG: Normal sinus rhythm - Ordered orthostatic vital signs -not resulted -- Fall precautions

## 2024-01-27 NOTE — Assessment & Plan Note (Signed)
 Patient is complaining of constipation, however, she endorsed diarrhea earlier this morning.  Nursing denies patient having diarrhea, however reports regular bowel movements. -- d/c fiber - Start daily MiraLAX  17 g

## 2024-01-27 NOTE — Progress Notes (Signed)
 Daily Progress Note Intern Pager: [phone]  Patient name: LYNCOLN MASKELL Medical record number: [id number] Date of birth: 10-15-1965 Age: 58 y.o. Gender: female  Primary Care Provider: Earvin Johnston PARAS, FNP Consultants: None Code Status: Full code  Pt Overview and Major Events to Date:  8/5: Admitted for pyelonephritis  8/7: Transitioned to PO antibiotics  8/8: medically stable for discharge   Assessment and Plan:  ALIZAH SILLS is a 58 y.o. female admitted for pyelonephritis now on PO antibiotics. Now awaiting placement at SNF to improve mobility and safety while ambulating.  Medically stable for discharge.  Assessment & Plan Pyelonephritis Stable, improving. S/P Rocepin x2 days.  - continue Cefadroxil  1 g BID for 4 days (day 2/4) - Urine culture pending  - blood culture neg x3 days  Type 2 diabetes mellitus with hyperglycemia (HCC) Improving. Received 25 units aspart for correction 8/8. A1c 14.3 this admission  Home meds: glimepiride , 20 units long acting insulin , 0-18 units for meal coverage, metformin  1000 BID - Increase Semglee  to 28 units daily  - Start 5 units meal time coverage  - Resistant SSI - CBG check 4 times daily with meals, and at bedtime Severe malnutrition (HCC) 2/2 to [facility]. At risk for refeeding syndrome.  - RD following - monitor daily Mag, Phos, BMP - Continue MVI, folic acid , thiamine , Vit C  Alcohol use disorder Stable. Last drink 8/5.  - DC CIWA today, now 4 days s/p last drink  - Vitamin B 100 mg daily, Folic acid  1 mg daily - Cont hydroxyzine  25 mg 3 times daily as needed for anxiety Chronic health problem Substance Abuse: TOC consult, cont nicotine  patch 21 mg daily, Nicotine  4 mg gum PRN Insomnia: cont melatonin  T2DM Neuropathy: continue Cymbalta  60 mg daily  Pressure ulcer: superficial, present on admission. Wound care management as ordered  Constipation: daily Miralax , experienced diarrhea with fiber supplements.  OA of Hip/Low back  pain: uses crutches at home, cont tylenol , ibuprofen , oxy 2.5 mg PRN; add lidocaine  patch for muscle spasms   FEN/GI: Carb modified  PPx: Lovenox   Dispo:SNF in 2-3 days. Barriers include insurance auth.   Subjective:  Reports improvement of pain. Would like resources for applying to disability. Would like additional medication for back spasms and pain.   Objective: Temp:  [97.5 F (36.4 C)-98.4 F (36.9 C)] 97.9 F (36.6 C) (08/08 1749) Pulse Rate:  [66-79] 72 (08/08 1749) Resp:  [16-18] 17 (08/08 1749) BP: (100-143)/(64-99) 114/68 (08/08 1749) SpO2:  [93 %-99 %] 93 % (08/08 1749) Physical Exam: General: Chronically ill-appearing, no acute distress Cardio: RRR, no murmur on exam Pulm: clear, No increased work of breathing Abdomen: Soft, bowel sounds present, nontender Extremity: No peripheral edema   Laboratory: Most recent CBC Lab Results  Component Value Date   WBC 10.0 01/25/2024   HGB 13.3 01/25/2024   HCT 38.6 01/25/2024   MCV 91.7 01/25/2024   PLT 240 01/25/2024   Most recent BMP    Latest Ref Rng & Units 01/27/2024    7:06 AM  BMP  Glucose 70 - 99 mg/dL 794   BUN 6 - 20 mg/dL 16   Creatinine 9.55 - 1.00 mg/dL 9.58   Sodium 864 - 854 mmol/L 133   Potassium 3.5 - 5.1 mmol/L 4.5   Chloride 98 - 111 mmol/L 94   CO2 22 - 32 mmol/L 30   Calcium  8.9 - 10.3 mg/dL 9.0    Imaging/Diagnostic Tests: No new labs or imaging.  Cleotilde Perkins, DO 01/27/2024, 8:09 PM  PGY-3, [facility] FPTS Intern pager: [phone], text pages welcome Secure chat group [facility] Teaching Service

## 2024-01-27 NOTE — Assessment & Plan Note (Addendum)
 Reports drinking 40 ounces of beer daily, her last drink was this morning.  Current withdrawal symptoms include mild anxiety and agitation. No hx DT/withdrawal seizures - continue CIWA protocol for next 24-48h  -Vitamin B 100 mg daily -Folic acid  1 mg daily - Start hydroxyzine  25 mg 3 times daily as needed for anxiety

## 2024-01-27 NOTE — Progress Notes (Signed)
 Mobility Specialist Progress Note:   01/27/24 1038  Mobility  Activity Ambulated with assistance (To BR)  Level of Assistance Contact guard assist, steadying assist  Assistive Device Front wheel walker  Distance Ambulated (ft) 20 ft  Activity Response Tolerated well  Mobility Referral Yes  Mobility visit 1 Mobility  Mobility Specialist Start Time (ACUTE ONLY) 1028  Mobility Specialist Stop Time (ACUTE ONLY) 1038  Mobility Specialist Time Calculation (min) (ACUTE ONLY) 10 min   Received pt calling out to use the BR. No physical assistance needed. No c/o. Returned pt to bed without fault. Personal belongings and call light within reach. All needs met.  Lavanda Pollack Mobility Specialist  Please contact via [employer] or  Rehab Office [phone]

## 2024-01-27 NOTE — Progress Notes (Signed)
 Physical Therapy Treatment Patient Details Name: Nancy [name] MRN: [id number] DOB: 1965/10/16 Today's Date: 01/27/2024   History of Present Illness 58 y.o. female presents to [address] [facility] 01/24/24 with multiple falls and hypotension. On CIWA protocol with sepsis 2/2 pyelonephritis. PMH of T2DM, GERD, alcohol use disorder    PT Comments  Pt admitted with above diagnosis. Pt anxious and refusing to work with PT intiially. With encouragement, pt finally ambulated 5 feet with RW with CGA. Pt stated back up and would not allow PT to assist her with movement however didn't need assist and didn't appear unsteady although impulsivity could cause unsteadiness.  Will continue to follow and progress as able.  Pt currently with functional limitations due to the deficits listed below (see PT Problem List). Pt will benefit from acute skilled PT to increase their independence and safety with mobility to allow discharge.       If plan is discharge home, recommend the following: A little help with walking and/or transfers;A little help with bathing/dressing/bathroom;Assist for transportation;Help with stairs or ramp for entrance   Can travel by private vehicle     Yes  Equipment Recommendations  None recommended by PT    Recommendations for Other Services       Precautions / Restrictions Precautions Precautions: Fall;Other (comment) Recall of Precautions/Restrictions: Intact Precaution/Restrictions Comments: watch BP Restrictions Weight Bearing Restrictions Per Provider Order: No     Mobility  Bed Mobility Overal bed mobility: Needs Assistance Bed Mobility: Supine to Sit, Sit to Supine     Supine to sit: Supervision Sit to supine: Supervision   General bed mobility comments: Pt sat to EOB on her own and told PT to back up.  Pt would not allow PT to assist her.  She initially refused to stand but with encouragement did a little bit with PT.    Transfers Overall transfer level: Needs  assistance Equipment used: Rolling walker (2 wheels), None Transfers: Sit to/from Stand, Bed to chair/wheelchair/BSC Sit to Stand: Contact guard assist           General transfer comment: cues for hand placement/technique.  First attempt, pt stood without device and held onto the rail of the bed.  Sat down and then agreed to take a few steps with the RW. Pt stood to RW and walked 5 feet forward and back and then sat and refused to get up again.    Ambulation/Gait Ambulation/Gait assistance: Contact guard assist Gait Distance (Feet): 5 Feet Assistive device: Rolling walker (2 wheels) Gait Pattern/deviations: Step-through pattern, Decreased stride length, Trunk flexed Gait velocity: decr     General Gait Details: forward flexed trunk with shuffling steps. No overt LOB   Stairs             Wheelchair Mobility     Tilt Bed    Modified Rankin (Stroke Patients Only)       Balance Overall balance assessment: Mild deficits observed, not formally tested, History of Falls Sitting-balance support: No upper extremity supported, Feet supported Sitting balance-Leahy Scale: Good     Standing balance support: Bilateral upper extremity supported, During functional activity, Reliant on assistive device for balance Standing balance-Leahy Scale: Poor Standing balance comment: reliant on RW and external support                            Communication Communication Communication: No apparent difficulties  Cognition Arousal: Alert Behavior During Therapy: WFL for tasks assessed/performed, Impulsive  PT - Cognitive impairments: Safety/Judgement                       PT - Cognition Comments: slightly impulsive with pt moving quickly at times and without therapist close by as she tells PT to back up. Following commands: Intact      Cueing Cueing Techniques: Verbal cues  Exercises      General Comments        Pertinent Vitals/Pain Pain  Assessment Pain Assessment: Faces Faces Pain Scale: Hurts whole lot Breathing: normal Negative Vocalization: none Facial Expression: smiling or inexpressive Body Language: relaxed Consolability: no need to console PAINAD Score: 0 Pain Location: generalized; worse in B hips and B feet Pain Descriptors / Indicators: Aching, Discomfort, Grimacing, Burning, Numbness, Constant, Other (Comment) Pain Intervention(s): Limited activity within patient's tolerance, Monitored during session, Repositioned    Home Living                          Prior Function            PT Goals (current goals can now be found in the care plan section) Acute Rehab PT Goals Patient Stated Goal: to not have any more falls Progress towards PT goals: Not progressing toward goals - comment (self limiting)    Frequency    Min 2X/week      PT Plan      Co-evaluation              AM-PAC PT 6 Clicks Mobility   Outcome Measure  Help needed turning from your back to your side while in a flat bed without using bedrails?: A Little Help needed moving from lying on your back to sitting on the side of a flat bed without using bedrails?: A Little Help needed moving to and from a bed to a chair (including a wheelchair)?: A Little Help needed standing up from a chair using your arms (e.g., wheelchair or bedside chair)?: A Little Help needed to walk in hospital room?: A Little Help needed climbing 3-5 steps with a railing? : A Lot 6 Click Score: 17    End of Session Equipment Utilized During Treatment:  (refused gait belt) Activity Tolerance: Patient limited by fatigue (self limiting) Patient left: in bed;with call bell/phone within reach;with bed alarm set Nurse Communication: Mobility status PT Visit Diagnosis: Other abnormalities of gait and mobility (R26.89);Muscle weakness (generalized) (M62.81);History of falling (Z91.81)     Time: 1056-1110 PT Time Calculation (min) (ACUTE ONLY): 14  min  Charges:    $Gait Training: 8-22 mins PT General Charges $$ ACUTE PT VISIT: 1 Visit                     Calise Dunckel M,PT Acute Rehab Services [phone]    Nancy [name] 01/27/2024, 11:27 AM

## 2024-01-27 NOTE — Assessment & Plan Note (Signed)
 Patient reports chronic low back pain, denying new onset pain since the fall Continue Tylenol  to 1000 mg every 6h as needed.  Today patient is complaining of right sided back pain - Continue ibuprofin 400mg  q6 PRN - Continue low dose pain regimen oxycodone  2.5 mg q. 6 as needed -PT/OT ordered to evaluate-recommending skilled nursing-short-term rehab

## 2024-01-27 NOTE — Assessment & Plan Note (Signed)
 Stable. Last drink 8/5.  - DC CIWA today, now 4 days s/p last drink  - Vitamin B 100 mg daily, Folic acid  1 mg daily - Cont hydroxyzine  25 mg 3 times daily as needed for anxiety

## 2024-01-27 NOTE — Assessment & Plan Note (Signed)
 2/2 to [facility]. At risk for refeeding syndrome.  - RD following - monitor daily Mag, Phos, BMP - Continue MVI, folic acid , thiamine , Vit C

## 2024-01-27 NOTE — Progress Notes (Signed)
 Encouraged pt to open blinds and get out of the bed. She became irriated and refused to get up. I informed her that it is beneficial for her to move more and open the blinds during the day to help her know when it is day or night time.

## 2024-01-27 NOTE — Assessment & Plan Note (Addendum)
 Admits to daily cocaine use - TOC consult for substance use counseling - UDS pending - Continue nicotine  patch to 21 mg daily -Nicotine  gum 4 mg as needed

## 2024-01-27 NOTE — Assessment & Plan Note (Addendum)
 Blood glucose remains elevated and poorly controlled, however improving since admission.  Of note, pt was admitted on 10/2023 for DKA and A1c at that time was 15.5. Her home regimen includes: glimepiride , 20 units long acting insulin , 0-18 units for meal coverage, metformin  1000 BID - Continue Semglee  to 25 units daily, reassess tomorrow - Resistant sliding scale insulin  ordered --CBG check 4 times daily with meals, and at bedtime -AM CMP, CBC --Patient changed to carb modified diet --RD following patient --A1c: 14.3

## 2024-01-27 NOTE — Assessment & Plan Note (Addendum)
 Patient is complaining of muscle spasms of her hips and knees.  Patient says she takes Flexeril  at home -Consulted with pharmacy, patient not taking Flexeril , and on several sedating medications, would not start additional medication for this problem.

## 2024-01-27 NOTE — Assessment & Plan Note (Signed)
 Patient is still complaining of some numbness and tingling in her bilateral feet - Continue patient's Cymbalta  60 mg daily

## 2024-01-27 NOTE — TOC Progression Note (Signed)
 Transition of Care [facility]) - Progression Note    Patient Details  Name: Nancy [name] MRN: [id number] Date of Birth: 07/10/1965  [facility] CM/SW Contact  Chasady Longwell LITTIE Moose, LCSW Phone Number: 01/27/2024, 9:35 AM  Clinical Narrative:    Pt currently does not have any SNF bed offers. CSW will continue to follow.    Expected Discharge Plan: Skilled Nursing Facility Barriers to Discharge: Continued Medical Work up, English as a second language teacher, SNF Pending bed offer               Expected Discharge Plan and Services       Living arrangements for the past 2 months: Single Family Home                                       Social Drivers of Health (SDOH) Interventions SDOH Screenings   Food Insecurity: Food Insecurity Present (01/24/2024)  Housing: High Risk (01/24/2024)  Transportation Needs: Unmet Transportation Needs (01/24/2024)  Utilities: At Risk (01/24/2024)  Financial Resource Strain: Not on File (10/08/2021)   Received from [facility]  Physical Activity: Not on File (10/08/2021)   Received from [facility]  Social Connections: Socially Isolated (10/26/2023)  Stress: Not on File (10/08/2021)   Received from [facility]  Tobacco Use: High Risk (01/24/2024)    Readmission Risk Interventions     No data to display

## 2024-01-27 NOTE — Assessment & Plan Note (Signed)
 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 Reassuringly not having pain over her hip so will not further investigate this inpatient.  - Continue Tylenol  to 1000 mg every 6h as needed - Continue ibuprofin 400mg  q6 PRN - Continue low dose pain regimen oxycodone  2.5 mg q. 6 as needed -PT/OT ordered to evaluate -recommending skilled nursing-short-term rehab

## 2024-01-27 NOTE — Assessment & Plan Note (Signed)
 Stable, improving. S/P Rocepin x2 days.  - continue Cefadroxil  1 g BID for 4 days (day 2/4) - Urine culture pending  - blood culture neg x3 days

## 2024-01-27 NOTE — Assessment & Plan Note (Addendum)
 unclear etiology, presenting for 3 weeks, but could be due to hyperglycemia, viral infection.  Suspect this is due to uncontrolled hyperglycemia. - Continue IV fluids - Monitor BM while inpatient  - Continuing to have nonbloody diarrhea, however less frequently now

## 2024-01-27 NOTE — Assessment & Plan Note (Addendum)
 Improving. Received 25 units aspart for correction 8/8. A1c 14.3 this admission  Home meds: glimepiride , 20 units long acting insulin , 0-18 units for meal coverage, metformin  1000 BID - Increase Semglee  to 28 units daily  - Start 5 units meal time coverage  - Resistant SSI - CBG check 4 times daily with meals, and at bedtime

## 2024-01-27 NOTE — Assessment & Plan Note (Deleted)
 Suspect hypotension on arrival due to hypovolemia 2/2 to 3 weeks of diarrhea, and some vomiting - Continue IVF - Ordered orthostatic vital signs -- lactic acid trending down 2.9 -> 1.9 -> 1.7

## 2024-01-27 NOTE — Assessment & Plan Note (Addendum)
 Superficial pressure ulcer right buttock, patient says wound care is managing well - Wound care on board

## 2024-01-27 NOTE — Inpatient Diabetes Management (Signed)
 Inpatient Diabetes Program Recommendations  AACE/ADA: New Consensus Statement on Inpatient Glycemic Control (2015)  Target Ranges:  Prepandial:   less than 140 mg/dL      Peak postprandial:   less than 180 mg/dL (1-2 hours)      Critically ill patients:  140 - 180 mg/dL   Lab Results  Component Value Date   GLUCAP 250 (H) 01/27/2024   HGBA1C 14.3 (H) 01/24/2024    Review of Glycemic Control  Latest Reference Range & Units 01/25/24 16:04 01/25/24 20:58 01/26/24 08:45 01/26/24 12:34 01/26/24 17:29 01/26/24 20:52 01/27/24 08:34  Glucose-Capillary 70 - 99 mg/dL 777 (H) 701 (H) 717 (H) 198 (H) 230 (H) 351 (H) 250 (H)  (H): Data is abnormally high Diabetes history: DM 2 Outpatient Diabetes medications: Lantus  20 units, Novolog  0-18 units tid, Amaryl  2 mg Daily, Metformin  1000 mg bid Current orders for Inpatient glycemic control:  Semglee  25 units Novolog  0-20 units + hs   Consider increasing Semglee  28 units at bedtime and adding Novolog  3 units TID (assuming patient consuming >50% of meal)  Thanks, Tinnie Minus, MSN, RNC-OB Diabetes Coordinator [phone] (8a-5p)

## 2024-01-28 DIAGNOSIS — N12 Tubulo-interstitial nephritis, not specified as acute or chronic: Secondary | ICD-10-CM | POA: Diagnosis not present

## 2024-01-28 LAB — BASIC METABOLIC PANEL WITH GFR
Anion gap: 10 (ref 5–15)
BUN: 18 mg/dL (ref 6–20)
CO2: 29 mmol/L (ref 22–32)
Calcium: 9.3 mg/dL (ref 8.9–10.3)
Chloride: 98 mmol/L (ref 98–111)
Creatinine, Ser: 0.43 mg/dL — ABNORMAL LOW (ref 0.44–1.00)
GFR, Estimated: >60 mL/min (ref >60)
Glucose, Bld: 106 mg/dL — ABNORMAL HIGH (ref 70–99)
Potassium: 4.2 mmol/L (ref 3.5–5.1)
Sodium: 137 mmol/L (ref 135–145)

## 2024-01-28 LAB — GLUCOSE, CAPILLARY
Glucose-Capillary: 172 mg/dL — ABNORMAL HIGH (ref 70–99)
Glucose-Capillary: 90 mg/dL (ref 70–99)
Glucose-Capillary: 249 mg/dL — ABNORMAL HIGH (ref 70–99)
Glucose-Capillary: 341 mg/dL — ABNORMAL HIGH (ref 70–99)

## 2024-01-28 LAB — PHOSPHORUS: Phosphorus: 4.7 mg/dL — ABNORMAL HIGH (ref 2.5–4.6)

## 2024-01-28 LAB — MAGNESIUM: Magnesium: 1.6 mg/dL — ABNORMAL LOW (ref 1.7–2.4)

## 2024-01-28 MED ORDER — CYCLOBENZAPRINE HCL 5 MG PO TABS
5.0000 mg | ORAL_TABLET | Freq: Once | ORAL | Status: AC
  Administered 2024-01-29: 5 mg via ORAL
  Filled 2024-01-28: qty 1

## 2024-01-28 MED ORDER — INSULIN ASPART 100 UNIT/ML IJ SOLN
5.0000 [IU] | Freq: Three times a day (TID) | INTRAMUSCULAR | Status: DC
  Administered 2024-01-28 – 2024-02-01 (×20): 5 [IU] via SUBCUTANEOUS

## 2024-01-28 MED ORDER — GABAPENTIN 100 MG PO CAPS
100.0000 mg | ORAL_CAPSULE | Freq: Three times a day (TID) | ORAL | Status: DC
  Administered 2024-01-28: 100 mg via ORAL
  Filled 2024-01-28 (×2): qty 1

## 2024-01-28 MED ORDER — LIDOCAINE 5 % EX PTCH
1.0000 | MEDICATED_PATCH | Freq: Every day | CUTANEOUS | Status: DC
  Administered 2024-01-29 – 2024-02-16 (×22): 1 via TRANSDERMAL
  Filled 2024-01-28 (×21): qty 1

## 2024-01-28 NOTE — Assessment & Plan Note (Addendum)
 Improving.  A1c 14.3 this admission  Home meds: glimepiride , 20 units long acting insulin , 0-18 units for meal coverage, metformin  1000 BID - Continue Semglee  to 28 units daily  - Continue 5 units meal time coverage  - Resistant SSI - CBG check 4 times daily with meals, and at bedtime

## 2024-01-28 NOTE — Progress Notes (Signed)
 Daily Progress Note Intern Pager: [phone]  Patient name: Nancy [name] Medical record number: [id number] Date of birth: Sep 15, 1965 Age: 58 y.o. Gender: female  Primary Care Provider: Earvin Johnston PARAS, FNP Consultants: None Code Status: Full Code  Pt Overview and Major Events to Date:  8/5: Admitted for pyelonephritis  8/7: Transitioned to PO antibiotics  8/8: medically stable for discharge   Medical Decision Making:  Nancy [name] is a 58 y.o. admitted for pyelonephritis now on PO antibiotics, medically stable, awaiting SNF placement. Pertinent PMH/PSH includes T2DM, peripheral neuropathy, OA.  Assessment & Plan Pyelonephritis Stable, improving. S/P Rocepin x2 days.  - continue Cefadroxil  1 g BID for 4 days (day 4/5) - blood culture neg x4 days  Type 2 diabetes mellitus with hyperglycemia (HCC) Improving.  A1c 14.3 this admission  Home meds: glimepiride , 20 units long acting insulin , 0-18 units for meal coverage, metformin  1000 BID - Continue Semglee  to 28 units daily  - Continue 5 units meal time coverage  - Resistant SSI - CBG check 4 times daily with meals, and at bedtime Severe malnutrition (HCC) Protein-calorie malnutrition, severe 2/2 to SDOH. At risk for refeeding syndrome.  - RD following - monitor daily Mag, Phos, BMP - Continue MVI, folic acid , thiamine , Vit C  - Decrease fluids to 78ml/hr Alcohol use disorder Stable. Last drink 8/5.  - CIWA protocol dced.  - Vitamin B 100 mg daily, Folic acid  1 mg daily - Cont hydroxyzine  25 mg 3 times daily as needed for anxiety Chronic health problem Substance Abuse: TOC consult, cont nicotine  patch 21 mg daily, Nicotine  4 mg gum PRN Insomnia: cont melatonin  T2DM Neuropathy: continue Cymbalta  60 mg daily, trial gabapentin  100mg  TID Pressure ulcer: superficial, present on admission. Wound care management as ordered  Constipation: daily Miralax , experienced diarrhea with fiber supplements.  OA of Hip/Low back pain:  uses crutches at home, cont tylenol , ibuprofen , oxy 2.5 mg PRN; add lidocaine  patch for muscle spasms    FEN/GI: Carb modified diet PPx: Lovenox  Dispo:SNF pending placement.  Subjective:  NAEO. Got flexaril x1. Reports some softer stools today. Continues to have burning sensation in bilateral feet. Explained she refused Gabapentin  yesterday because of side effects but could not specify what those were. She is willing to try again.  Objective: Temp:  [97.5 F (36.4 C)-98 F (36.7 C)] 97.5 F (36.4 C) (08/09 1726) Pulse Rate:  [65-80] 80 (08/09 1726) Resp:  [16-18] 18 (08/09 1726) BP: (94-113)/(53-87) 113/87 (08/09 1726) SpO2:  [96 %-97 %] 97 % (08/09 1726) Weight:  [58 kg] 58 kg (08/09 0500) Physical Exam: General: NAD, cachectic, lying comfortably in hospital bed Neuro: A&O Cardiovascular: RRR, no murmurs,  Respiratory: normal WOB on RA, CTAB, no wheezes, ronchi or rales Abdomen: soft, NTTP, no rebound or guarding Extremities: Moving all 4 extremities equally   Laboratory: Most recent CBC Lab Results  Component Value Date   WBC 10.0 01/25/2024   HGB 13.3 01/25/2024   HCT 38.6 01/25/2024   MCV 91.7 01/25/2024   PLT 240 01/25/2024   Most recent BMP    Latest Ref Rng & Units 01/28/2024    7:04 AM  BMP  Glucose 70 - 99 mg/dL 893   BUN 6 - 20 mg/dL 18   Creatinine 9.55 - 1.00 mg/dL 9.56   Sodium 864 - 854 mmol/L 137   Potassium 3.5 - 5.1 mmol/L 4.2   Chloride 98 - 111 mmol/L 98   CO2 22 - 32 mmol/L 29  Calcium  8.9 - 10.3 mg/dL 9.3     Glucose 09->750->658  Imaging/Diagnostic Tests:  No new imaging.  Nancy Sharper, MD 01/28/2024, 10:36 PM  PGY-3, [facility] FPTS Intern pager: [phone], text pages welcome Secure chat group [facility] Teaching Service

## 2024-01-28 NOTE — Assessment & Plan Note (Signed)
 Substance Abuse: TOC consult, cont nicotine  patch 21 mg daily, Nicotine  4 mg gum PRN Insomnia: cont melatonin  T2DM Neuropathy: continue Cymbalta  60 mg daily, trial gabapentin  100mg  TID Pressure ulcer: superficial, present on admission. Wound care management as ordered  Constipation: daily Miralax , experienced diarrhea with fiber supplements.  OA of Hip/Low back pain: uses crutches at home, cont tylenol , ibuprofen , oxy 2.5 mg PRN; add lidocaine  patch for muscle spasms

## 2024-01-28 NOTE — Assessment & Plan Note (Addendum)
 Stable. Last drink 8/5.  - CIWA protocol dced.  - Vitamin B 100 mg daily, Folic acid  1 mg daily - Cont hydroxyzine  25 mg 3 times daily as needed for anxiety

## 2024-01-28 NOTE — Progress Notes (Signed)
 Patient does not want to take gabapentin  and want's to express some grievances about it. Called charge nurse to come to the patient's room to listen to grievances.

## 2024-01-28 NOTE — Assessment & Plan Note (Addendum)
 2/2 to SDOH. At risk for refeeding syndrome.  - RD following - monitor daily Mag, Phos, BMP - Continue MVI, folic acid , thiamine , Vit C  - Decrease fluids to 75ml/hr

## 2024-01-28 NOTE — Assessment & Plan Note (Addendum)
 Stable, improving. S/P Rocepin x2 days.  - continue Cefadroxil  1 g BID for 4 days (day 4/5) - blood culture neg x4 days

## 2024-01-28 NOTE — Assessment & Plan Note (Signed)
 2/2 to SDOH. At risk for refeeding syndrome.  - RD following - monitor daily Mag, Phos, BMP - Continue MVI, folic acid , thiamine , Vit C  - Decrease fluids to 75ml/hr

## 2024-01-28 NOTE — Plan of Care (Signed)
   Problem: Education: Goal: Ability to describe self-care measures that may prevent or decrease complications (Diabetes Survival Skills Education) will improve Outcome: Progressing Goal: Individualized Educational Video(s) Outcome: Progressing

## 2024-01-29 DIAGNOSIS — N12 Tubulo-interstitial nephritis, not specified as acute or chronic: Secondary | ICD-10-CM | POA: Diagnosis not present

## 2024-01-29 LAB — CULTURE, BLOOD (ROUTINE X 2)
Culture: NO GROWTH
Culture: NO GROWTH
Special Requests: ADEQUATE

## 2024-01-29 LAB — GLUCOSE, CAPILLARY
Glucose-Capillary: 127 mg/dL — ABNORMAL HIGH (ref 70–99)
Glucose-Capillary: 135 mg/dL — ABNORMAL HIGH (ref 70–99)
Glucose-Capillary: 207 mg/dL — ABNORMAL HIGH (ref 70–99)
Glucose-Capillary: 189 mg/dL — ABNORMAL HIGH (ref 70–99)

## 2024-01-29 MED ORDER — GABAPENTIN 100 MG PO CAPS
100.0000 mg | ORAL_CAPSULE | Freq: Three times a day (TID) | ORAL | Status: DC
  Administered 2024-01-29 (×3): 100 mg via ORAL
  Filled 2024-01-29 (×3): qty 1

## 2024-01-29 MED ORDER — GLUCERNA SHAKE PO LIQD
237.0000 mL | Freq: Three times a day (TID) | ORAL | Status: DC
  Administered 2024-01-29 – 2024-02-16 (×51): 237 mL via ORAL
  Filled 2024-01-29: qty 237

## 2024-01-29 NOTE — Plan of Care (Signed)
  Problem: Coping: Goal: Ability to adjust to condition or change in health will improve Outcome: Progressing   Problem: Fluid Volume: Goal: Ability to maintain a balanced intake and output will improve Outcome: Progressing   Problem: Health Behavior/Discharge Planning: Goal: Ability to identify and utilize available resources and services will improve Outcome: Progressing Goal: Ability to manage health-related needs will improve Outcome: Progressing

## 2024-01-29 NOTE — Progress Notes (Signed)
 Blood extraction not yet done. Followed up with phlebotomy to no avail.

## 2024-01-29 NOTE — Plan of Care (Signed)
   Problem: Education: Goal: Ability to describe self-care measures that may prevent or decrease complications (Diabetes Survival Skills Education) will improve Outcome: Progressing   Problem: Metabolic: Goal: Ability to maintain appropriate glucose levels will improve Outcome: Progressing   Problem: Nutritional: Goal: Maintenance of adequate nutrition will improve Outcome: Progressing

## 2024-01-29 NOTE — Consult Note (Signed)
 Secure chat from primary nurse regarding conflicting wound care orders.  Will DC Xeroform and continue with Medihoney due to presence of necrotic tissue.    Thank carola Moats Yaniv Lage MSN, RN-BC, [employer] [phone]

## 2024-01-29 NOTE — Progress Notes (Signed)
 Called by primary RN that pt would like to discuss about a medication concern she had. Pt voiced that she would not like to take gabapentin  for her LE pain/spasm as she read something not good about that medicine. Said that she had tried flexeril  before and it greatly helps with her symptoms. Primary RN to relay pt's concern to MD.

## 2024-01-30 DIAGNOSIS — R059 Cough, unspecified: Secondary | ICD-10-CM

## 2024-01-30 DIAGNOSIS — N12 Tubulo-interstitial nephritis, not specified as acute or chronic: Secondary | ICD-10-CM | POA: Diagnosis not present

## 2024-01-30 LAB — GLUCOSE, CAPILLARY
Glucose-Capillary: 236 mg/dL — ABNORMAL HIGH (ref 70–99)
Glucose-Capillary: 116 mg/dL — ABNORMAL HIGH (ref 70–99)
Glucose-Capillary: 179 mg/dL — ABNORMAL HIGH (ref 70–99)
Glucose-Capillary: 260 mg/dL — ABNORMAL HIGH (ref 70–99)

## 2024-01-30 LAB — BASIC METABOLIC PANEL WITH GFR
Anion gap: 11 (ref 5–15)
BUN: 21 mg/dL — ABNORMAL HIGH (ref 6–20)
CO2: 26 mmol/L (ref 22–32)
Calcium: 9.3 mg/dL (ref 8.9–10.3)
Chloride: 97 mmol/L — ABNORMAL LOW (ref 98–111)
Creatinine, Ser: 0.45 mg/dL (ref 0.44–1.00)
GFR, Estimated: >60 mL/min (ref >60)
Glucose, Bld: 168 mg/dL — ABNORMAL HIGH (ref 70–99)
Potassium: 4.4 mmol/L (ref 3.5–5.1)
Sodium: 134 mmol/L — ABNORMAL LOW (ref 135–145)

## 2024-01-30 LAB — PHOSPHORUS: Phosphorus: 4.9 mg/dL — ABNORMAL HIGH (ref 2.5–4.6)

## 2024-01-30 LAB — MAGNESIUM: Magnesium: 1.5 mg/dL — ABNORMAL LOW (ref 1.7–2.4)

## 2024-01-30 MED ORDER — CYCLOBENZAPRINE HCL 5 MG PO TABS
5.0000 mg | ORAL_TABLET | Freq: Every day | ORAL | Status: DC
  Administered 2024-01-30 – 2024-02-02 (×7): 5 mg via ORAL
  Filled 2024-01-30 (×4): qty 1

## 2024-01-30 MED ORDER — MAGNESIUM SULFATE 2 GM/50ML IV SOLN
2.0000 g | Freq: Once | INTRAVENOUS | Status: AC
  Administered 2024-01-30 (×2): 2 g via INTRAVENOUS
  Filled 2024-01-30: qty 50

## 2024-01-30 MED ORDER — HYDROCORTISONE 1 % EX CREA
TOPICAL_CREAM | Freq: Three times a day (TID) | CUTANEOUS | Status: DC | PRN
  Filled 2024-01-30: qty 28

## 2024-01-30 NOTE — Plan of Care (Signed)
   Problem: Education: Goal: Ability to describe self-care measures that may prevent or decrease complications (Diabetes Survival Skills Education) will improve Outcome: Progressing

## 2024-01-30 NOTE — Progress Notes (Signed)
 Physical Therapy Treatment Patient Details Name: Nancy [name] MRN: [id number] DOB: 12/20/1965 Today's Date: 01/30/2024   History of Present Illness 58 y.o. female presents to [facility] 01/24/24 with multiple falls and hypotension. On CIWA protocol with sepsis 2/2 pyelonephritis. PMH of T2DM, GERD, alcohol use disorder    PT Comments  Pt resting in bed on arrival and agreeable to session. Pt demonstrating slow but steady progress towards acute goals as pt continues to be limited by LE pain, limiting weightbearing and standing tolerance. Pt demonstrating bed mobility and transfers with grossly CGA-supervision for safety with pt demonstrating good recall for safety. Pt performing short in room ambulation with RW for support and CGA for safety with distance limited by LE pain, however pt without LOB. Pt was educated on continued walker use to maximize functional independence, safety, and decrease risk for falls, as well as importance of frequent mobilization with pt verbalizing understanding. Patient will benefit from continued inpatient follow up therapy, <3 hours/day, will continue to follow acutely.    If plan is discharge home, recommend the following: A little help with walking and/or transfers;A little help with bathing/dressing/bathroom;Assist for transportation;Help with stairs or ramp for entrance   Can travel by private vehicle     Yes  Equipment Recommendations  None recommended by PT    Recommendations for Other Services       Precautions / Restrictions Precautions Precautions: Fall;Other (comment) Recall of Precautions/Restrictions: Intact Precaution/Restrictions Comments: watch BP Restrictions Weight Bearing Restrictions Per Provider Order: No     Mobility  Bed Mobility Overal bed mobility: Needs Assistance Bed Mobility: Supine to Sit, Sit to Supine     Supine to sit: Supervision Sit to supine: Supervision   General bed mobility comments: pt bring bil LEs to and off EOB  without assist, keeping LEs straight and pivoting on bottom.    Transfers Overall transfer level: Needs assistance Equipment used: Rolling walker (2 wheels), None Transfers: Sit to/from Stand, Bed to chair/wheelchair/BSC Sit to Stand: Contact guard assist           General transfer comment: CGA for safety, good recall for hand placement and good eccentric control to sit    Ambulation/Gait Ambulation/Gait assistance: Contact guard assist Gait Distance (Feet): 18 Feet Assistive device: Rolling walker (2 wheels) Gait Pattern/deviations: Step-through pattern, Decreased stride length, Trunk flexed Gait velocity: decr     General Gait Details: distance limited by fatigue and ankle pain with weight bearing as well as pt reporting her toes cramping, cues for keeping RW in contact with floor especially with turning, no overt LOB   Stairs             Wheelchair Mobility     Tilt Bed    Modified Rankin (Stroke Patients Only)       Balance Overall balance assessment: Mild deficits observed, not formally tested, History of Falls Sitting-balance support: No upper extremity supported, Feet supported Sitting balance-Leahy Scale: Good     Standing balance support: Bilateral upper extremity supported, During functional activity, Reliant on assistive device for balance Standing balance-Leahy Scale: Poor Standing balance comment: reliant on RW and external support                            Communication Communication Communication: No apparent difficulties  Cognition Arousal: Alert Behavior During Therapy: WFL for tasks assessed/performed, Impulsive   PT - Cognitive impairments: Safety/Judgement  Following commands: Intact      Cueing Cueing Techniques: Verbal cues  Exercises Other Exercises Other Exercises: gait belt issued for heel cord streching with pt able to perform with cues for technique    General Comments         Pertinent Vitals/Pain Pain Assessment Pain Assessment: Faces Faces Pain Scale: Hurts little more Pain Location: hops/knees, bil ankles with weightbearing Pain Descriptors / Indicators: Aching, Discomfort, Grimacing, Burning, Numbness, Constant, Other (Comment) Pain Intervention(s): Monitored during session, Limited activity within patient's tolerance    Home Living                          Prior Function            PT Goals (current goals can now be found in the care plan section) Acute Rehab PT Goals Patient Stated Goal: to not have any more falls PT Goal Formulation: With patient Time For Goal Achievement: 02/08/24 Progress towards PT goals: Progressing toward goals (slowly)    Frequency    Min 2X/week      PT Plan      Co-evaluation              AM-PAC PT 6 Clicks Mobility   Outcome Measure  Help needed turning from your back to your side while in a flat bed without using bedrails?: A Little Help needed moving from lying on your back to sitting on the side of a flat bed without using bedrails?: A Little Help needed moving to and from a bed to a chair (including a wheelchair)?: A Little Help needed standing up from a chair using your arms (e.g., wheelchair or bedside chair)?: A Little Help needed to walk in hospital room?: Total (<20') Help needed climbing 3-5 steps with a railing? : A Lot 6 Click Score: 15    End of Session Equipment Utilized During Treatment:  (refused gait belt) Activity Tolerance: Patient limited by pain;Patient tolerated treatment well Patient left: in bed;with call bell/phone within reach;with bed alarm set;with nursing/sitter in room Nurse Communication: Mobility status PT Visit Diagnosis: Other abnormalities of gait and mobility (R26.89);Muscle weakness (generalized) (M62.81);History of falling (Z91.81)     Time: 9096-9084 PT Time Calculation (min) (ACUTE ONLY): 12 min  Charges:    $Gait Training: 8-22  mins PT General Charges $$ ACUTE PT VISIT: 1 Visit                     Makynzie Dobesh R. PTA Acute Rehabilitation Services Office: [phone]   [name] 01/30/2024, 9:22 AM

## 2024-01-30 NOTE — Assessment & Plan Note (Signed)
 Stable. Last drink 8/5.  - CIWA protocol d/c'd.  - Vitamin B 100 mg daily, Folic acid  1 mg daily - Cont hydroxyzine  25 mg 3 times daily as needed for anxiety

## 2024-01-30 NOTE — Assessment & Plan Note (Addendum)
 Most likely atelectasis. - Encourage incentive spirometer

## 2024-01-30 NOTE — Progress Notes (Signed)
 Daily Progress Note Intern Pager: [phone]  Patient name: Nancy [name] Medical record number: [id number] Date of birth: 1966-04-07 Age: 58 y.o. Gender: female  Primary Care Provider: Earvin Johnston PARAS, FNP Consultants: none Code Status: FULL  Pt Overview and Major Events to Date:  08/05: Admitted  Assessment and Plan:  Nancy [name] is a 58yo F admitted for pylenophritis. Pertinent PMH/PSH includes T2DM, peripheral neuropathy, chronic bilateral hip pain s/p severe hip OA on crutches at home.  Assessment & Plan Pyelonephritis Stable, improving. S/P Rocepin x2 days.  - continue Cefadroxil  1 g BID for 5 days (day 5/5) - blood culture neg x5 days  Type 2 diabetes mellitus with hyperglycemia (HCC) A1c 14.3 this admission. Improving. Home meds: glimepiride , 20 units long acting insulin , 0-18 units for meal coverage, metformin  1000 BID - Continue Semglee  28u - Continue 5 units meal time coverage  - Resistant SSI - CBG check 4x daily with meals, and at bedtime Severe malnutrition (HCC) Protein-calorie malnutrition, severe 2/2 to SDOH. At risk for refeeding syndrome.  - RD following - monitor daily Mag, Phos, BMP - Mag 1.5 today - goal 1.7-2.4mg /dL, replete as indicated - Continue MVI, folic acid , thiamine , Vit C  - Decrease fluids to 45ml/hr Alcohol use disorder Stable. Last drink 8/5.  - CIWA protocol d/c'd.  - Vitamin B 100 mg daily, Folic acid  1 mg daily - Cont hydroxyzine  25 mg 3 times daily as needed for anxiety Cough Most likely atelectasis. - Encourage incentive spirometer Chronic health problem Substance Abuse: TOC consult, cont nicotine  patch 21 mg daily, Nicotine  4 mg gum PRN Insomnia: cont melatonin, hydroxyzine  T2DM Neuropathy: continue Cymbalta  60 mg daily, trial gabapentin  100mg  TID Pressure ulcer: superficial, present on admission. Wound care management as ordered  Constipation: daily Miralax , experienced diarrhea with fiber supplements.  OA of Hip/Low  back pain: uses crutches at home, cont tylenol , ibuprofen , oxy 2.5 mg PRN; add flexeril  for muscle spasms   FEN/GI: carb modified diet PPx: Lovenox  Dispo:SNF pending bed offer.   Subjective:  Patient was seen and evaluated at bedside. She states she feels much better today after her morning shower. She reports mild dysuria but denies hematuria, pelvic pain, flank pain, or fevers. She has a slight productive cough since her admission which produces green sputum. She denies CP, SOB, or any other complaints at this time. She states gabapentin  does not work for her muscle pain and spasms, and would like to stop that and switch to Flexeril  which she had last night with relief.   Objective: Temp:  [97.8 F (36.6 C)-98.6 F (37 C)] 97.8 F (36.6 C) (08/11 0413) Pulse Rate:  [69-83] 69 (08/11 0413) Resp:  [16-18] 18 (08/10 1633) BP: (97-108)/(61-75) 108/75 (08/11 0413) SpO2:  [94 %-99 %] 98 % (08/11 0413)  Physical Exam: General: A&O, NAD, sitting up comfortably in bed Cardiovascular: RRR, no m/r/g Respiratory: CTAB, normal WOB, no w/r/r Abdomen: soft, non-tender, no masses, no guarding or rebound, BS present Extremities: no peripheral edema, moves all extremities equally  Laboratory: Most recent CBC Lab Results  Component Value Date   WBC 10.0 01/25/2024   HGB 13.3 01/25/2024   HCT 38.6 01/25/2024   MCV 91.7 01/25/2024   PLT 240 01/25/2024   Most recent BMP    Latest Ref Rng & Units 01/28/2024    7:04 AM  BMP  Glucose 70 - 99 mg/dL 893   BUN 6 - 20 mg/dL 18   Creatinine 9.55 - 1.00 mg/dL 9.56  Sodium 135 - 145 mmol/L 137   Potassium 3.5 - 5.1 mmol/L 4.2   Chloride 98 - 111 mmol/L 98   CO2 22 - 32 mmol/L 29   Calcium  8.9 - 10.3 mg/dL 9.3    Lupie Credit, DO 01/30/2024, 7:20 AM  PGY-1, [facility] FPTS Intern pager: [phone], text pages welcome Secure chat group [facility] Teaching Service

## 2024-01-30 NOTE — Assessment & Plan Note (Addendum)
 2/2 to SDOH. At risk for refeeding syndrome.  - RD following - monitor daily Mag, Phos, BMP - Mag 1.5 today - goal 1.7-2.4mg /dL, replete as indicated - Continue MVI, folic acid , thiamine , Vit C  - Decrease fluids to 75ml/hr

## 2024-01-30 NOTE — Assessment & Plan Note (Addendum)
 Stable, improving. S/P Rocepin x2 days.  - continue Cefadroxil  1 g BID for 5 days (day 5/5) - blood culture neg x5 days

## 2024-01-30 NOTE — Plan of Care (Signed)

## 2024-01-30 NOTE — Assessment & Plan Note (Addendum)
 A1c 14.3 this admission. Improving. Home meds: glimepiride , 20 units long acting insulin , 0-18 units for meal coverage, metformin  1000 BID - Continue Semglee  28u - Continue 5 units meal time coverage  - Resistant SSI - CBG check 4x daily with meals, and at bedtime

## 2024-01-30 NOTE — Plan of Care (Signed)
 Problem: Education: Goal: Ability to describe self-care measures that may prevent or decrease complications (Diabetes Survival Skills Education) will improve 01/30/2024 1902 by Darah Simkin K, RN Outcome: Progressing 01/30/2024 1902 by Hristopher Missildine K, RN Outcome: Progressing Goal: Individualized Educational Video(s) 01/30/2024 1902 by Tierra Thoma K, RN Outcome: Progressing 01/30/2024 1902 by Chirstina Haan K, RN Outcome: Progressing   Problem: Coping: Goal: Ability to adjust to condition or change in health will improve 01/30/2024 1902 by Shawonda Kerce K, RN Outcome: Progressing 01/30/2024 1902 by Florian Dellis POUR, RN Outcome: Progressing   Problem: Fluid Volume: Goal: Ability to maintain a balanced intake and output will improve 01/30/2024 1902 by Florian Dellis POUR, RN Outcome: Progressing 01/30/2024 1902 by Florian Dellis POUR, RN Outcome: Progressing   Problem: Health Behavior/Discharge Planning: Goal: Ability to identify and utilize available resources and services will improve 01/30/2024 1902 by Sloane Palmer K, RN Outcome: Progressing 01/30/2024 1902 by Florian Dellis POUR, RN Outcome: Progressing Goal: Ability to manage health-related needs will improve 01/30/2024 1902 by Florian Dellis POUR, RN Outcome: Progressing 01/30/2024 1902 by Florian Dellis POUR, RN Outcome: Progressing   Problem: Metabolic: Goal: Ability to maintain appropriate glucose levels will improve 01/30/2024 1902 by Florian Dellis POUR, RN Outcome: Progressing 01/30/2024 1902 by Florian Dellis POUR, RN Outcome: Progressing   Problem: Nutritional: Goal: Maintenance of adequate nutrition will improve 01/30/2024 1902 by Chelcey Caputo K, RN Outcome: Progressing 01/30/2024 1902 by Crawford Tamura K, RN Outcome: Progressing Goal: Progress toward achieving an optimal weight will improve 01/30/2024 1902 by Florian Dellis POUR, RN Outcome: Progressing 01/30/2024 1902 by Florian Dellis POUR, RN Outcome: Progressing   Problem:  Skin Integrity: Goal: Risk for impaired skin integrity will decrease 01/30/2024 1902 by Florian Dellis POUR, RN Outcome: Progressing 01/30/2024 1902 by Florian Dellis POUR, RN Outcome: Progressing   Problem: Tissue Perfusion: Goal: Adequacy of tissue perfusion will improve 01/30/2024 1902 by Florian Dellis POUR, RN Outcome: Progressing 01/30/2024 1902 by Florian Dellis POUR, RN Outcome: Progressing   Problem: Education: Goal: Knowledge of General Education information will improve Description: Including pain rating scale, medication(s)/side effects and non-pharmacologic comfort measures 01/30/2024 1902 by Leandra Vanderweele K, RN Outcome: Progressing 01/30/2024 1902 by Florian Dellis POUR, RN Outcome: Progressing   Problem: Health Behavior/Discharge Planning: Goal: Ability to manage health-related needs will improve 01/30/2024 1902 by Nyasia Baxley K, RN Outcome: Progressing 01/30/2024 1902 by Florian Dellis POUR, RN Outcome: Progressing   Problem: Clinical Measurements: Goal: Ability to maintain clinical measurements within normal limits will improve 01/30/2024 1902 by Florian Dellis POUR, RN Outcome: Progressing 01/30/2024 1902 by Florian Dellis POUR, RN Outcome: Progressing Goal: Will remain free from infection 01/30/2024 1902 by Florian Dellis POUR, RN Outcome: Progressing 01/30/2024 1902 by Boen Sterbenz K, RN Outcome: Progressing Goal: Diagnostic test results will improve 01/30/2024 1902 by Florian Dellis POUR, RN Outcome: Progressing 01/30/2024 1902 by Rabia Argote K, RN Outcome: Progressing Goal: Respiratory complications will improve 01/30/2024 1902 by Oza Oberle K, RN Outcome: Progressing 01/30/2024 1902 by Betheny Suchecki K, RN Outcome: Progressing Goal: Cardiovascular complication will be avoided 01/30/2024 1902 by Katlyne Nishida K, RN Outcome: Progressing 01/30/2024 1902 by Winifred Bodiford K, RN Outcome: Progressing   Problem: Activity: Goal: Risk for activity intolerance will  decrease 01/30/2024 1902 by Cynai Skeens K, RN Outcome: Progressing 01/30/2024 1902 by Soua Lenk K, RN Outcome: Progressing   Problem: Nutrition: Goal: Adequate nutrition will be maintained 01/30/2024 1902 by Trixy Loyola K, RN Outcome: Progressing 01/30/2024 1902 by Merrit Friesen K, RN Outcome: Progressing  Problem: Coping: Goal: Level of anxiety will decrease 01/30/2024 1902 by Florian Dellis POUR, RN Outcome: Progressing 01/30/2024 1902 by Florian Dellis POUR, RN Outcome: Progressing   Problem: Elimination: Goal: Will not experience complications related to bowel motility 01/30/2024 1902 by Florian Dellis POUR, RN Outcome: Progressing 01/30/2024 1902 by Florian Dellis POUR, RN Outcome: Progressing Goal: Will not experience complications related to urinary retention 01/30/2024 1902 by Florian Dellis POUR, RN Outcome: Progressing 01/30/2024 1902 by Colisha Redler K, RN Outcome: Progressing   Problem: Pain Managment: Goal: General experience of comfort will improve and/or be controlled 01/30/2024 1902 by Santoria Chason K, RN Outcome: Progressing 01/30/2024 1902 by Jadalyn Oliveri K, RN Outcome: Progressing   Problem: Safety: Goal: Ability to remain free from injury will improve 01/30/2024 1902 by Florian Dellis POUR, RN Outcome: Progressing 01/30/2024 1902 by Ashlay Altieri K, RN Outcome: Progressing   Problem: Skin Integrity: Goal: Risk for impaired skin integrity will decrease 01/30/2024 1902 by Carmisha Larusso K, RN Outcome: Progressing 01/30/2024 1902 by Valeri Sula K, RN Outcome: Progressing

## 2024-01-30 NOTE — TOC Progression Note (Signed)
 Transition of Care [facility]) - Progression Note    Patient Details  Name: Nancy [name] MRN: [id number] Date of Birth: 1966-01-25  Transition of Care [facility]) CM/SW Contact  Wandra Babin LITTIE Moose, LCSW Phone Number: 01/30/2024, 9:45 AM  Clinical Narrative:    Pt does not have any SNF bed offers. CSW will continue to follow.   Expected Discharge Plan: Skilled Nursing Facility Barriers to Discharge: Continued Medical Work up, English as a second language teacher, SNF Pending bed offer               Expected Discharge Plan and Services       Living arrangements for the past 2 months: Single Family Home                                       Social Drivers of Health (SDOH) Interventions SDOH Screenings   Food Insecurity: Food Insecurity Present (01/24/2024)  Housing: High Risk (01/24/2024)  Transportation Needs: Unmet Transportation Needs (01/24/2024)  Utilities: At Risk (01/24/2024)  Financial Resource Strain: Not on File (10/08/2021)   Received from [facility]  Physical Activity: Not on File (10/08/2021)   Received from [facility]  Social Connections: Socially Isolated (10/26/2023)  Stress: Not on File (10/08/2021)   Received from [facility]  Tobacco Use: High Risk (01/24/2024)    Readmission Risk Interventions     No data to display

## 2024-01-30 NOTE — Assessment & Plan Note (Addendum)
 Substance Abuse: TOC consult, cont nicotine  patch 21 mg daily, Nicotine  4 mg gum PRN Insomnia: cont melatonin, hydroxyzine  T2DM Neuropathy: continue Cymbalta  60 mg daily, trial gabapentin  100mg  TID Pressure ulcer: superficial, present on admission. Wound care management as ordered  Constipation: daily Miralax , experienced diarrhea with fiber supplements.  OA of Hip/Low back pain: uses crutches at home, cont tylenol , ibuprofen , oxy 2.5 mg PRN; add flexeril  for muscle spasms

## 2024-01-31 DIAGNOSIS — N12 Tubulo-interstitial nephritis, not specified as acute or chronic: Secondary | ICD-10-CM | POA: Diagnosis not present

## 2024-01-31 LAB — GLUCOSE, CAPILLARY
Glucose-Capillary: 322 mg/dL — ABNORMAL HIGH (ref 70–99)
Glucose-Capillary: 156 mg/dL — ABNORMAL HIGH (ref 70–99)
Glucose-Capillary: 364 mg/dL — ABNORMAL HIGH (ref 70–99)
Glucose-Capillary: 284 mg/dL — ABNORMAL HIGH (ref 70–99)
Glucose-Capillary: 276 mg/dL — ABNORMAL HIGH (ref 70–99)

## 2024-01-31 LAB — BASIC METABOLIC PANEL WITH GFR
Anion gap: 7 (ref 5–15)
BUN: 24 mg/dL — ABNORMAL HIGH (ref 6–20)
CO2: 30 mmol/L (ref 22–32)
Calcium: 9.3 mg/dL (ref 8.9–10.3)
Chloride: 95 mmol/L — ABNORMAL LOW (ref 98–111)
Creatinine, Ser: 0.46 mg/dL (ref 0.44–1.00)
GFR, Estimated: >60 mL/min (ref >60)
Glucose, Bld: 296 mg/dL — ABNORMAL HIGH (ref 70–99)
Potassium: 4.5 mmol/L (ref 3.5–5.1)
Sodium: 132 mmol/L — ABNORMAL LOW (ref 135–145)

## 2024-01-31 LAB — PHOSPHORUS: Phosphorus: 5.2 mg/dL — ABNORMAL HIGH (ref 2.5–4.6)

## 2024-01-31 LAB — MAGNESIUM: Magnesium: 1.6 mg/dL — ABNORMAL LOW (ref 1.7–2.4)

## 2024-01-31 MED ORDER — GABAPENTIN 100 MG PO CAPS
200.0000 mg | ORAL_CAPSULE | Freq: Three times a day (TID) | ORAL | Status: DC
  Administered 2024-01-31 – 2024-02-01 (×6): 200 mg via ORAL
  Filled 2024-01-31 (×3): qty 2

## 2024-01-31 MED ORDER — INSULIN GLARGINE-YFGN 100 UNIT/ML ~~LOC~~ SOLN
35.0000 [IU] | SUBCUTANEOUS | Status: DC
  Administered 2024-01-31 (×2): 35 [IU] via SUBCUTANEOUS
  Filled 2024-01-31 (×2): qty 0.35

## 2024-01-31 MED ORDER — MAGNESIUM SULFATE 2 GM/50ML IV SOLN
2.0000 g | Freq: Once | INTRAVENOUS | Status: AC
  Administered 2024-01-31 (×2): 2 g via INTRAVENOUS
  Filled 2024-01-31: qty 50

## 2024-01-31 NOTE — Plan of Care (Signed)
  Problem: Pain Managment: Goal: General experience of comfort will improve and/or be controlled Outcome: Progressing   Problem: Safety: Goal: Ability to remain free from injury will improve Outcome: Progressing   Problem: Skin Integrity: Goal: Risk for impaired skin integrity will decrease Outcome: Progressing

## 2024-01-31 NOTE — Assessment & Plan Note (Addendum)
 2/2 to SDOH. At risk for refeeding syndrome.  - RD following - monitor daily Mag, Phos, BMP - Mag 1.5 today - goal 1.7-2.4mg /dL, replete as indicated - Continue MVI, folic acid , thiamine , Vit C

## 2024-01-31 NOTE — Inpatient Diabetes Management (Addendum)
 Inpatient Diabetes Program Recommendations  AACE/ADA: New Consensus Statement on Inpatient Glycemic Control (2015)  Target Ranges:  Prepandial:   less than 140 mg/dL      Peak postprandial:   less than 180 mg/dL (1-2 hours)      Critically ill patients:  140 - 180 mg/dL   Lab Results  Component Value Date   GLUCAP 322 (H) 01/31/2024   HGBA1C 14.3 (H) 01/24/2024    Review of Glycemic Control  Latest Reference Range & Units 01/30/24 09:15 01/30/24 11:40 01/30/24 17:11 01/30/24 20:31 01/31/24 08:07  Glucose-Capillary 70 - 99 mg/dL 763 (H) 883 (H) 820 (H) 260 (H) 322 (H)   Diabetes history: DM 2 Outpatient Diabetes medications: Lantus  20 units, Novolog  0-18 units tid, Amaryl  2 mg Daily, Metformin  1000 mg bid Current orders for Inpatient glycemic control:  Semglee  28 units Novolog  0-20 units + hs Novolog  5 units tid meal coverage  Glucerna tid between meals  -   Increase Novolog  meal coverage to 8 units tid if eating >50% of meals/supplements  Thanks, Clotilda Bull RN, MSN, BC-ADM Inpatient Diabetes Coordinator Team Pager [phone] (8a-5p)

## 2024-01-31 NOTE — Progress Notes (Signed)
 Daily Progress Note Intern Pager: [phone]  Patient name: Nancy [name] Medical record number: [id number] Date of birth: [birth year] Age: 58 y.o. Gender: female  Primary Care Provider: Earvin Johnston PARAS, FNP Consultants: none Code Status: FULL  Pt Overview and Major Events to Date:  08/05: Admitted  Assessment and Plan:  Nancy [name] is a 58yo F admitted for pylenophritis. Hospital course has been uneventful. Pertinent PMH/PSH includes T2DM, peripheral neuropathy, chronic bilateral hip pain s/p severe hip OA on crutches at home.  Assessment & Plan Pyelonephritis Resolved.  - s/p Rocephin  (day 2/2, completed 08/06) - continue Cefadroxil  1 g BID for 5 days (day 5/5, completed 08/11) - blood culture neg x6 days  Type 2 diabetes mellitus with hyperglycemia (HCC) A1c 14.3 this admission. Home meds: glimepiride , 20 units long acting insulin , 0-18 units for meal coverage, metformin  1000 BID - Increase Semglee  to 35u since patient is on regular diet - Continue 5 units meal time coverage  - Resistant SSI - CBG check 4x daily with meals, and at bedtime Severe malnutrition (HCC) Protein-calorie malnutrition, severe 2/2 to SDOH. At risk for refeeding syndrome.  - RD following - monitor daily Mag, Phos, BMP - Mag 1.5 today - goal 1.7-2.4mg /dL, replete as indicated - Continue MVI, folic acid , thiamine , Vit C  Alcohol use disorder Stable. Last drink 8/5.  - CIWA protocol d/c'd.  - Vitamin B 100 mg daily, Folic acid  1 mg daily - Cont hydroxyzine  25 mg 3 times daily as needed for anxiety Cough Most likely atelectasis. - Continue incentive spirometer Chronic health problem Substance Abuse: TOC consult, cont nicotine  patch 21 mg daily, Nicotine  4 mg gum PRN Insomnia: cont melatonin, hydroxyzine  T2DM Neuropathy: continue Cymbalta  60 mg daily, trial gabapentin  100mg  TID Pressure ulcer: superficial, present on admission. Wound care management as ordered  Constipation: daily Miralax ,  experienced diarrhea with fiber supplements.  OA of Hip/Low back pain: uses crutches at home, cont tylenol , ibuprofen , oxy 2.5 mg PRN; add flexeril  for muscle spasms   FEN/GI: carb modified diet PPx: Lovenox  Dispo:SNF pending bed offer.   Subjective:  Patient was seen and evaluated at bedside. She states she is doing well. No complaints today.   Objective: Temp:  [97.7 F (36.5 C)-98.4 F (36.9 C)] 98.4 F (36.9 C) (08/12 0806) Pulse Rate:  [70-83] 70 (08/12 0806) Resp:  [16-18] 16 (08/12 0806) BP: (91-111)/(53-68) 91/53 (08/12 0806) SpO2:  [96 %-98 %] 97 % (08/12 0806) Weight:  [59.7 kg] 59.7 kg (08/12 0442)  Physical Exam: General: A&O, NAD Cardiovascular: RRR, no m/r/g Respiratory: CTAB, normal WOB, no w/r/r Abdomen: soft, non-tender, BS present Extremities: no peripheral edema, moves all extremities equally   Laboratory: Most recent CBC Lab Results  Component Value Date   WBC 10.0 01/25/2024   HGB 13.3 01/25/2024   HCT 38.6 01/25/2024   MCV 91.7 01/25/2024   PLT 240 01/25/2024   Most recent BMP    Latest Ref Rng & Units 01/31/2024    6:16 AM  BMP  Glucose 70 - 99 mg/dL 703   BUN 6 - 20 mg/dL 24   Creatinine 9.55 - 1.00 mg/dL 9.53   Sodium 864 - 854 mmol/L 132   Potassium 3.5 - 5.1 mmol/L 4.5   Chloride 98 - 111 mmol/L 95   CO2 22 - 32 mmol/L 30   Calcium  8.9 - 10.3 mg/dL 9.3    Lupie Credit, DO 01/31/2024, 9:00 AM  PGY-1, [facility] FPTS Intern pager: [phone], text  pages welcome Secure chat group [facility] Teaching Service

## 2024-01-31 NOTE — Plan of Care (Signed)
   Problem: Coping: Goal: Ability to adjust to condition or change in health will improve Outcome: Progressing   Problem: Nutrition: Goal: Adequate nutrition will be maintained Outcome: Progressing   Problem: Pain Managment: Goal: General experience of comfort will improve and/or be controlled Outcome: Progressing   Problem: Safety: Goal: Ability to remain free from injury will improve Outcome: Progressing

## 2024-01-31 NOTE — Progress Notes (Signed)
 Occupational Therapy Treatment Patient Details Name: Nancy [name] MRN: [id number] DOB: 07-12-1965 Today's Date: 01/31/2024   History of present illness 58 y.o. female [facility]24[facility] with multiple falls and hypotension. On CIWA protocol with sepsis 2/2 pyelonephritis. PMH of T2DM, GERD, alcohol use disorder   OT comments  Pt making good progress with functional goals. Reviewed LB ADL A/E with pt this session as well as ADL mobility safety using RW. OT will continue to follow acutely to maximize level of function and safety      If plan is discharge home, recommend the following:  A little help with walking and/or transfers;A lot of help with bathing/dressing/bathroom;Assistance with cooking/housework;Assist for transportation;Help with stairs or ramp for entrance   Equipment Recommendations  Other (comment) (defer)    Recommendations for Other Services      Precautions / Restrictions Precautions Precautions: Fall;Other (comment) Recall of Precautions/Restrictions: Intact Precaution/Restrictions Comments: watch BP Restrictions Weight Bearing Restrictions Per Provider Order: No       Mobility Bed Mobility Overal bed mobility: Needs Assistance Bed Mobility: Supine to Sit, Sit to Supine     Supine to sit: Supervision Sit to supine: Supervision        Transfers Overall transfer level: Needs assistance Equipment used: Rolling walker (2 wheels), None Transfers: Sit to/from Stand, Bed to chair/wheelchair/BSC Sit to Stand: Contact guard assist     Step pivot transfers: Contact guard assist     General transfer comment: CGA for safety, good recall for hand placement     Balance Overall balance assessment: Mild deficits observed, not formally tested, History of Falls Sitting-balance support: No upper extremity supported, Feet supported Sitting balance-Leahy Scale: Good     Standing balance support: Bilateral upper extremity supported, During functional  activity Standing balance-Leahy Scale: Poor                             ADL either performed or assessed with clinical judgement   ADL Overall ADL's : Needs assistance/impaired     Grooming: Wash/dry hands;Wash/dry face;Set up;Sitting   Upper Body Bathing: Set up;Supervision/ safety;Sitting   Lower Body Bathing: Moderate assistance;Cueing for compensatory techniques;Sit to/from stand;Sitting/lateral leans   Upper Body Dressing : Set up;Supervision/safety;Sitting   Lower Body Dressing: Moderate assistance;Sitting/lateral leans;Sit to/from stand;Cueing for compensatory techniques   Toilet Transfer: Contact guard assist;Rolling walker (2 wheels);Regular Toilet;Grab bars   Toileting- Clothing Manipulation and Hygiene: Contact guard assist;Sitting/lateral lean;Sit to/from stand       Functional mobility during ADLs: Contact guard assist;Rolling walker (2 wheels) General ADL Comments: reviewed LB ADL A/E with pt, pt verbalized and demos understading, reports that she was familair with how to use the A/E prior    Extremity/Trunk Assessment Upper Extremity Assessment Upper Extremity Assessment: Generalized weakness;Right hand dominant   Lower Extremity Assessment Lower Extremity Assessment: Defer to PT evaluation   Cervical / Trunk Assessment Cervical / Trunk Assessment: Normal    Vision Ability to See in Adequate Light: 0 Adequate Patient Visual Report: No change from baseline     Perception     Praxis     Communication Communication Communication: No apparent difficulties   Cognition Arousal: Alert Behavior During Therapy: WFL for tasks assessed/performed, Impulsive                                 Following commands: Intact  Cueing   Cueing Techniques: Verbal cues  Exercises      Shoulder Instructions       General Comments      Pertinent Vitals/ Pain       Pain Assessment Pain Assessment: 0-10 Pain Score: 5  Pain  Location: B hips, knees, ankles Pain Descriptors / Indicators: Aching, Discomfort, Grimacing, Constant Pain Intervention(s): Limited activity within patient's tolerance, Monitored during session, Repositioned  Home Living                                          Prior Functioning/Environment              Frequency  Min 2X/week        Progress Toward Goals  OT Goals(current goals can now be found in the care plan section)  Progress towards OT goals: Progressing toward goals     Plan      Co-evaluation                 AM-PAC OT 6 Clicks Daily Activity     Outcome Measure   Help from another person eating meals?: None Help from another person taking care of personal grooming?: A Little Help from another person toileting, which includes using toliet, bedpan, or urinal?: A Little Help from another person bathing (including washing, rinsing, drying)?: A Little Help from another person to put on and taking off regular upper body clothing?: A Little Help from another person to put on and taking off regular lower body clothing?: A Little 6 Click Score: 19    End of Session Equipment Utilized During Treatment: Rolling walker (2 wheels);Other (comment);Gait belt (ADL A/E)  OT Visit Diagnosis: Unsteadiness on feet (R26.81);Repeated falls (R29.6);History of falling (Z91.81);Muscle weakness (generalized) (M62.81);Pain Pain - Right/Left:  (bilaterally) Pain - part of body: Leg;Hip;Knee;Ankle and joints of foot   Activity Tolerance Patient tolerated treatment well;Patient limited by pain   Patient Left with call bell/phone within reach;with bed alarm set;in bed   Nurse Communication Mobility status        Time: 8967-8944 OT Time Calculation (min): 23 min  Charges: OT General Charges $OT Visit: 1 Visit OT Treatments $Self Care/Home Management : 8-22 mins $Therapeutic Activity: 8-22 mins    Jacques Karna Loose 01/31/2024, 2:47 PM

## 2024-01-31 NOTE — Progress Notes (Signed)
 Patient refused dressing change; dressing was changed after she showered this morning.

## 2024-01-31 NOTE — Assessment & Plan Note (Signed)
 Substance Abuse: TOC consult, cont nicotine  patch 21 mg daily, Nicotine  4 mg gum PRN Insomnia: cont melatonin, hydroxyzine  T2DM Neuropathy: continue Cymbalta  60 mg daily, trial gabapentin  100mg  TID Pressure ulcer: superficial, present on admission. Wound care management as ordered  Constipation: daily Miralax , experienced diarrhea with fiber supplements.  OA of Hip/Low back pain: uses crutches at home, cont tylenol , ibuprofen , oxy 2.5 mg PRN; add flexeril  for muscle spasms

## 2024-01-31 NOTE — Assessment & Plan Note (Addendum)
 A1c 14.3 this admission. Home meds: glimepiride , 20 units long acting insulin , 0-18 units for meal coverage, metformin  1000 BID - Increase Semglee  to 35u since patient is on regular diet - Continue 5 units meal time coverage  - Resistant SSI - CBG check 4x daily with meals, and at bedtime

## 2024-01-31 NOTE — Assessment & Plan Note (Addendum)
 Resolved.  - s/p Rocephin  (day 2/2, completed 08/06) - continue Cefadroxil  1 g BID for 5 days (day 5/5, completed 08/11) - blood culture neg x6 days

## 2024-01-31 NOTE — Progress Notes (Signed)
 Daily Progress Note Intern Pager: [phone]  Patient name: Nancy [name] Medical record number: [id number] Date of birth: [birth year] Age: 58 y.o. Gender: female  Primary Care Provider: Earvin Johnston PARAS, FNP Consultants: none Code Status: FULL  Pt Overview and Major Events to Date:  08/05: Admitted  Assessment and Plan:  Nancy [name] is a 58yo F admitted for pylenophritis. Hospital course has been uneventful. Pertinent PMH/PSH includes T2DM, peripheral neuropathy, chronic bilateral hip pain s/p severe hip OA on crutches at home.  Assessment & Plan Pyelonephritis Resolved.  - s/p Rocephin  (day 2/2, completed 08/06) - continue Cefadroxil  1 g BID for 5 days (day 5/5, completed 08/11) - blood culture neg x6 days  Type 2 diabetes mellitus with hyperglycemia (HCC) A1c 14.3 this admission. Home meds: glimepiride , 20 units long acting insulin , 0-18 units for meal coverage, metformin  1000 BID - Increase Semglee  to 35u since patient is on regular diet - Continue 5 units meal time coverage  - Resistant SSI - CBG check 4x daily with meals, and at bedtime Severe malnutrition (HCC) Protein-calorie malnutrition, severe 2/2 to SDOH. At risk for refeeding syndrome.  - RD following - monitor daily Mag, Phos, BMP - Mag 1.5 today - goal 1.7-2.4mg /dL, replete as indicated - Continue MVI, folic acid , thiamine , Vit C  Alcohol use disorder Stable. Last drink 8/5.  - CIWA protocol d/c'd.  - Vitamin B 100 mg daily, Folic acid  1 mg daily - Cont hydroxyzine  25 mg 3 times daily as needed for anxiety Cough Most likely atelectasis. - Continue incentive spirometer Chronic health problem Substance Abuse: TOC consult, cont nicotine  patch 21 mg daily, Nicotine  4 mg gum PRN Insomnia: cont melatonin, hydroxyzine  T2DM Neuropathy: continue Cymbalta  60 mg daily, trial gabapentin  100mg  TID Pressure ulcer: superficial, present on admission. Wound care management as ordered  Constipation: daily Miralax ,  experienced diarrhea with fiber supplements.  OA of Hip/Low back pain: uses crutches at home, cont tylenol , ibuprofen , oxy 2.5 mg PRN; add flexeril  for muscle spasms   FEN/GI: carb modified diet PPx: Lovenox  Dispo:SNF pending bed offer.   Subjective:  Patient was seen and evaluated at bedside. She states she is doing well. No complaints today.   Objective: Temp:  [97.7 F (36.5 C)-98.4 F (36.9 C)] 98.4 F (36.9 C) (08/12 0806) Pulse Rate:  [70-83] 70 (08/12 0806) Resp:  [16-18] 16 (08/12 0806) BP: (91-111)/(53-68) 91/53 (08/12 0806) SpO2:  [96 %-98 %] 97 % (08/12 0806) Weight:  [59.7 kg] 59.7 kg (08/12 0442)  Physical Exam: General: A&O, NAD Cardiovascular: RRR, no m/r/g Respiratory: CTAB, normal WOB, no w/r/r Abdomen: soft, non-tender, BS present Extremities: no peripheral edema, moves all extremities equally   Laboratory: Most recent CBC Lab Results  Component Value Date   WBC 10.0 01/25/2024   HGB 13.3 01/25/2024   HCT 38.6 01/25/2024   MCV 91.7 01/25/2024   PLT 240 01/25/2024   Most recent BMP    Latest Ref Rng & Units 01/31/2024    6:16 AM  BMP  Glucose 70 - 99 mg/dL 703   BUN 6 - 20 mg/dL 24   Creatinine 9.55 - 1.00 mg/dL 9.53   Sodium 864 - 854 mmol/L 132   Potassium 3.5 - 5.1 mmol/L 4.5   Chloride 98 - 111 mmol/L 95   CO2 22 - 32 mmol/L 30   Calcium  8.9 - 10.3 mg/dL 9.3    Nancy Credit, DO 01/31/2024, 9:00 AM  PGY-1, [facility] FPTS Intern pager: [phone], text  pages welcome Secure chat group [facility] Teaching Service

## 2024-01-31 NOTE — Assessment & Plan Note (Signed)
 Stable. Last drink 8/5.  - CIWA protocol d/c'd.  - Vitamin B 100 mg daily, Folic acid  1 mg daily - Cont hydroxyzine  25 mg 3 times daily as needed for anxiety

## 2024-01-31 NOTE — Assessment & Plan Note (Addendum)
 Most likely atelectasis. - Continue incentive spirometer

## 2024-02-01 DIAGNOSIS — B3731 Acute candidiasis of vulva and vagina: Secondary | ICD-10-CM

## 2024-02-01 DIAGNOSIS — N12 Tubulo-interstitial nephritis, not specified as acute or chronic: Secondary | ICD-10-CM | POA: Diagnosis not present

## 2024-02-01 LAB — GLUCOSE, CAPILLARY
Glucose-Capillary: 414 mg/dL — ABNORMAL HIGH (ref 70–99)
Glucose-Capillary: 252 mg/dL — ABNORMAL HIGH (ref 70–99)
Glucose-Capillary: 296 mg/dL — ABNORMAL HIGH (ref 70–99)
Glucose-Capillary: 258 mg/dL — ABNORMAL HIGH (ref 70–99)

## 2024-02-01 MED ORDER — INSULIN ASPART 100 UNIT/ML IJ SOLN
10.0000 [IU] | Freq: Three times a day (TID) | INTRAMUSCULAR | Status: DC
  Administered 2024-02-01 – 2024-02-02 (×5): 10 [IU] via SUBCUTANEOUS

## 2024-02-01 MED ORDER — INSULIN GLARGINE-YFGN 100 UNIT/ML ~~LOC~~ SOLN
45.0000 [IU] | SUBCUTANEOUS | Status: DC
  Administered 2024-02-01 (×2): 45 [IU] via SUBCUTANEOUS
  Filled 2024-02-01 (×2): qty 0.45

## 2024-02-01 MED ORDER — MELATONIN 3 MG PO TABS
3.0000 mg | ORAL_TABLET | Freq: Once | ORAL | Status: AC
  Administered 2024-02-01 (×2): 3 mg via ORAL
  Filled 2024-02-01: qty 1

## 2024-02-01 MED ORDER — GABAPENTIN 300 MG PO CAPS
300.0000 mg | ORAL_CAPSULE | Freq: Three times a day (TID) | ORAL | Status: DC
  Administered 2024-02-01 – 2024-02-04 (×13): 300 mg via ORAL
  Filled 2024-02-01 (×11): qty 1

## 2024-02-01 MED ORDER — FLUCONAZOLE 150 MG PO TABS
150.0000 mg | ORAL_TABLET | Freq: Once | ORAL | Status: AC
  Administered 2024-02-01 (×2): 150 mg via ORAL
  Filled 2024-02-01: qty 1

## 2024-02-01 NOTE — Progress Notes (Signed)
 Mobility Specialist Progress Note:   02/01/24 1520  Mobility  Activity Ambulated with assistance (In hallway)  Level of Assistance Standby assist, set-up cues, supervision of patient - no hands on  Assistive Device Front wheel walker  Distance Ambulated (ft) 225 ft  Activity Response Tolerated well  Mobility Referral Yes  Mobility visit 1 Mobility  Mobility Specialist Start Time (ACUTE ONLY) 1430  Mobility Specialist Stop Time (ACUTE ONLY) 1446  Mobility Specialist Time Calculation (min) (ACUTE ONLY) 16 min   Received pt in bed and agreeable to mobility. No physical assistance needed. C/o SOB and BLE soreness, otherwise tolerated well. Pt's SPO2 was 97%. Returned to room without fault and left in bed. Personal belongings and call light within reach. All needs met.  Lavanda Pollack Mobility Specialist  Please contact via [employer] or  Rehab Office [phone]

## 2024-02-01 NOTE — Progress Notes (Signed)
 Answered patient's questions about having carb modified diet vs regular diet. Reinforced the importance of maintaining normal blood sugar levels in regards to wound healing. Patient expressed she understands and will try to make better food choices when ordering her food trays.

## 2024-02-01 NOTE — Progress Notes (Signed)
 PT Cancellation Note  Patient Details Name: ELDORA NAPP MRN: [id number] DOB: 01/05/1966   Cancelled Treatment:    Reason Eval/Treat Not Completed: (P) Patient declined, no reason specified, pt declining mobility stating she is fatigued from just working with mobility, requesting this PTA return in AM. Will check back as schedule allows to continue with PT POC.  Therisa SAUNDERS. PTA Acute Rehabilitation Services Office: [phone]   [name] 02/01/2024, 3:42 PM

## 2024-02-01 NOTE — Assessment & Plan Note (Deleted)
 Stable. Last drink 8/5.  - CIWA protocol d/c'd.  - Vitamin B 100 mg daily, Folic acid  1 mg daily - Cont hydroxyzine  25 mg 3 times daily as needed for anxiety

## 2024-02-01 NOTE — Progress Notes (Signed)
 Daily Progress Note Intern Pager: [phone]  Patient name: Nancy [name] Medical record number: [id number] Date of birth: [birth year] Age: 58 y.o. Gender: female  Primary Care Provider: Earvin Johnston PARAS, FNP Consultants: none Code Status: FULL  Pt Overview and Major Events to Date:  08/05: Admitted for pyelonephritis 08/11: Completed abx course for pyelonephritis 08/12: Medically stable for discharge pending SNF placement  Assessment and Plan:  Nancy [name] is a 58yo F admitted for pylenophritis. Hospital course has been uneventful. Pertinent PMH/PSH includes T2DM, peripheral neuropathy, chronic bilateral hip pain s/p severe hip OA on crutches at home. Stable for discharge; barriers to discharge include SNF bed placement offer pending. Assessment & Plan Type 2 diabetes mellitus with hyperglycemia (HCC) A1c 14.3 this admission. Home meds: glimepiride , 20 units long acting insulin , 0-18 units for meal coverage, metformin  1000 BID - Increase Semglee  from 35u to 45u since BG is spiking in 400s on regular diet - Increase from 5 to 10 units meal time coverage  - Resistant SSI - CBG check 4x daily with meals, and at bedtime Severe malnutrition (HCC) Protein-calorie malnutrition, severe 2/2 to SDOH. Improved throughout hospital stay with increased PO intake of 75% or more of all meals. - RD following - Continue MVI, folic acid , thiamine , Vit C  Vaginal candidiasis Vaginal discharge and irritation. Likely 2/2 recent abx courses.  - one dose of PO fluconazole  150mg  08/13 Chronic health problem Substance Abuse: TOC consult, cont nicotine  patch 21 mg daily, Nicotine  4 mg gum PRN Insomnia: cont melatonin, hydroxyzine  T2DM Neuropathy: continue Cymbalta  60 mg daily, trial gabapentin  100mg  TID Pressure ulcer: superficial, present on admission. Wound care management as ordered  Constipation: daily Miralax , experienced diarrhea with fiber supplements.  OA of Hip/Low back pain: uses crutches  at home, cont tylenol , ibuprofen , oxy 2.5 mg PRN; add flexeril  for muscle spasms  Alcohol use disorder: Stable. Last drink 8/5. CIWA protocol d/c'd. Vitamin B 100 mg daily, Folic acid  1 mg daily. Hydroxyzine  25 mg 3 times daily as needed for anxiety.  FEN/GI: Regular diet PPx: Lovenox  Dispo:SNF pending bed offer.    Subjective:  Patient was seen and evaluated at bedside. She reports vaginal discharge and irritation x1 day. She denies any dysuria, hematuria, abdominal pain, N/V, fever, or any other complaints at this time.   Objective: Temp:  [97.9 F (36.6 C)-98.4 F (36.9 C)] 98.1 F (36.7 C) (08/13 0523) Pulse Rate:  [70-76] 73 (08/13 0523) Resp:  [16-18] 18 (08/13 0523) BP: (91-107)/(53-75) 103/75 (08/13 0523) SpO2:  [96 %-98 %] 98 % (08/13 0523) Weight:  [58.3 kg] 58.3 kg (08/13 0523)  Physical Exam: General: A&O, NAD Cardiac: RRR, no m/r/g Respiratory: CTAB, normal WOB, no w/c/r GI: Soft, NTTP, non-distended  Extremities: NTTP, no peripheral edema. Psych: Appropriate mood and affect  Laboratory: Most recent CBC Lab Results  Component Value Date   WBC 10.0 01/25/2024   HGB 13.3 01/25/2024   HCT 38.6 01/25/2024   MCV 91.7 01/25/2024   PLT 240 01/25/2024   Most recent BMP    Latest Ref Rng & Units 01/31/2024    6:16 AM  BMP  Glucose 70 - 99 mg/dL 703   BUN 6 - 20 mg/dL 24   Creatinine 9.55 - 1.00 mg/dL 9.53   Sodium 864 - 854 mmol/L 132   Potassium 3.5 - 5.1 mmol/L 4.5   Chloride 98 - 111 mmol/L 95   CO2 22 - 32 mmol/L 30   Calcium  8.9 - 10.3 mg/dL 9.3  Lupie Credit, DO 02/01/2024, 7:45 AM  PGY-1, [facility] Family Medicine FPTS Intern pager: [phone], text pages welcome Secure chat group [facility] Teaching Service

## 2024-02-01 NOTE — Assessment & Plan Note (Addendum)
 2/2 to SDOH. Improved throughout hospital stay with increased PO intake of 75% or more of all meals. - RD following - Continue MVI, folic acid , thiamine , Vit C

## 2024-02-01 NOTE — Progress Notes (Signed)
 Recheck CBG 252 notified MD. No new orders.

## 2024-02-01 NOTE — Assessment & Plan Note (Deleted)
 Resolved.  - s/p Rocephin  (day 2/2, completed 08/06) - continue Cefadroxil  1 g BID for 5 days (day 5/5, completed 08/11) - blood culture neg x7 days

## 2024-02-01 NOTE — Progress Notes (Signed)
 Notified by Randine Saucer, NT pt's CBG 414. Dr. Camie Dixons secure chat to give 20 units corrective Novolog  and recheck in one hour.

## 2024-02-01 NOTE — Assessment & Plan Note (Addendum)
 Substance Abuse: TOC consult, cont nicotine  patch 21 mg daily, Nicotine  4 mg gum PRN Insomnia: cont melatonin, hydroxyzine  T2DM Neuropathy: continue Cymbalta  60 mg daily, trial gabapentin  100mg  TID Pressure ulcer: superficial, present on admission. Wound care management as ordered  Constipation: daily Miralax , experienced diarrhea with fiber supplements.  OA of Hip/Low back pain: uses crutches at home, cont tylenol , ibuprofen , oxy 2.5 mg PRN; add flexeril  for muscle spasms  Alcohol use disorder: Stable. Last drink 8/5. CIWA protocol d/c'd. Vitamin B 100 mg daily, Folic acid  1 mg daily. Hydroxyzine  25 mg 3 times daily as needed for anxiety.

## 2024-02-01 NOTE — Assessment & Plan Note (Deleted)
 Most likely atelectasis. - Continue incentive spirometer

## 2024-02-01 NOTE — Plan of Care (Signed)
   Problem: Activity: Goal: Risk for activity intolerance will decrease Outcome: Progressing   Problem: Nutrition: Goal: Adequate nutrition will be maintained Outcome: Progressing   Problem: Pain Managment: Goal: General experience of comfort will improve and/or be controlled Outcome: Progressing   Problem: Safety: Goal: Ability to remain free from injury will improve Outcome: Progressing

## 2024-02-01 NOTE — Progress Notes (Addendum)
 Nutrition Follow-up  DOCUMENTATION CODES:   Severe malnutrition in context of social or environmental circumstances  INTERVENTION:  Encourage PO intake Emphasis on protein with every meal and avoidance of sugar sweetened beverages Transition to Carb modified due to elevated CBGs Glucerna Shake po TID, each supplement provides 220 kcal and 10 grams of protein 1 packet Juven BID, each packet provides 95 calories, 2.5 grams of protein (collagen), to support wound healing Continue MVI, Folic acid , Thiamine  and Vit C  Order Magnesium  lab x 3 days as Magnesium  was 1.6 mg/dL yesterday  Provided DM handouts in AVS  Recommend outpatient DM education   NUTRITION DIAGNOSIS:   Severe Malnutrition related to chronic illness, social / environmental circumstances as evidenced by percent weight loss, energy intake < 75% for > or equal to 3 months, severe muscle depletion, moderate fat depletion. - Ongoing   GOAL:   Patient will meet greater than or equal to 90% of their needs - Progressing   MONITOR:   PO intake, Weight trends, Supplement acceptance, Labs, Skin  REASON FOR ASSESSMENT:   Consult Assessment of nutrition requirement/status, Diet education, Wound healing  ASSESSMENT:   Hx DM II, tobacco use, alcohol use, depression, GERD, chronic bilateral hip pain, frequent falls. Pt's roommate called EMS because they had been drinking all night and patient had had multiple falls. Last 3 weeks she has been having diarrhea more than 10 times a day per pt. She has also had some intermittent vomiting. She is complaining of right lower quadrant pain as well that has been associated with the diarrhea.  She cannot remember the last time she had anything to eat or drink. She states she feels like she is going through withdrawal but did admit to drinking last night.  She also reports she has been having a lot of migraine headaches.   Pt in shower on visit. PT with good PO intake since admission, eating  75-100% of her meals and drinking 2 Glucerna and 2 Juven per day to help with increased energy needs due to wounds and severe malnutrition diagnosis. Diet was changed to regular on 8/11, since then CBGs have increased significantly ranging from 156-364 mg/dL. Will change to CHO modified to better control CBGs. Magnesium  was low yesterday, MD repleted. No updated Magnesium  lab today, will order daily to monitor.Pt medically stable for discharge per MD.    Disposition: SNF, no offers yet   Admit weight: 75 kg - Suspect stated  Current weight: 58.3 kg    Average Meal Intake: 8/9-8/12: 75-100% intake x 8 recorded meals  Nutritionally Relevant Medications: Scheduled Meds:  ascorbic acid   500 mg Oral Daily   feeding supplement (GLUCERNA SHAKE)  237 mL Oral TID BM   folic acid   1 mg Oral Daily   ibuprofen   400 mg Oral Q6H   insulin  aspart  0-20 Units Subcutaneous TID WC   insulin  aspart  0-5 Units Subcutaneous QHS   insulin  aspart  5 Units Subcutaneous TID WC   insulin  glargine-yfgn  35 Units Subcutaneous Q24H   multivitamin with minerals  1 tablet Oral Daily   nutrition supplement (JUVEN)  1 packet Oral BID BM   thiamine   100 mg Oral Daily    Labs Reviewed: Sodium 132 BUN 24 Phosphorus 5.2 Magnesium  1.6 CBG ranges from 116-364 mg/dL over the last 24 hours HgbA1c 14.3   Diet Order:   Diet Order             Diet regular Room service appropriate? Yes;  Fluid consistency: Thin  Diet effective now                   EDUCATION NEEDS:   Education needs have been addressed  Skin:  Skin Assessment: Skin Integrity Issues: Skin Integrity Issues:: Stage II Stage II: R Buttocks  Last BM:  01/31/2024, type 5 x 2  Height:   Ht Readings from Last 1 Encounters:  01/24/24 5' 9 (1.753 m)    Weight:   Wt Readings from Last 1 Encounters:  02/01/24 58.3 kg    BMI:  Body mass index is 18.98 kg/m.  Estimated Nutritional Needs:   Kcal:  1875-2250 kcal  Protein:  90-115  g  Fluid:  >/=2L   Olivia Kenning, RD Registered Dietitian  See Amion for more information

## 2024-02-01 NOTE — Assessment & Plan Note (Addendum)
 Vaginal discharge and irritation. Likely 2/2 recent abx courses.  - one dose of PO fluconazole  150mg  08/13

## 2024-02-01 NOTE — Assessment & Plan Note (Addendum)
 A1c 14.3 this admission. Home meds: glimepiride , 20 units long acting insulin , 0-18 units for meal coverage, metformin  1000 BID - Increase Semglee  from 35u to 45u since BG is spiking in 400s on regular diet - Increase from 5 to 10 units meal time coverage  - Resistant SSI - CBG check 4x daily with meals, and at bedtime

## 2024-02-02 DIAGNOSIS — N12 Tubulo-interstitial nephritis, not specified as acute or chronic: Secondary | ICD-10-CM | POA: Diagnosis not present

## 2024-02-02 LAB — GLUCOSE, CAPILLARY
Glucose-Capillary: 239 mg/dL — ABNORMAL HIGH (ref 70–99)
Glucose-Capillary: 136 mg/dL — ABNORMAL HIGH (ref 70–99)
Glucose-Capillary: 285 mg/dL — ABNORMAL HIGH (ref 70–99)
Glucose-Capillary: 255 mg/dL — ABNORMAL HIGH (ref 70–99)

## 2024-02-02 LAB — MAGNESIUM: Magnesium: 1.6 mg/dL — ABNORMAL LOW (ref 1.7–2.4)

## 2024-02-02 MED ORDER — MAGNESIUM SULFATE 2 GM/50ML IV SOLN
2.0000 g | Freq: Once | INTRAVENOUS | Status: AC
  Administered 2024-02-02: 2 g via INTRAVENOUS
  Filled 2024-02-02: qty 50

## 2024-02-02 MED ORDER — INSULIN GLARGINE-YFGN 100 UNIT/ML ~~LOC~~ SOLN
55.0000 [IU] | SUBCUTANEOUS | Status: DC
  Administered 2024-02-02 – 2024-02-03 (×2): 55 [IU] via SUBCUTANEOUS
  Filled 2024-02-02 (×3): qty 0.55

## 2024-02-02 MED ORDER — INSULIN ASPART 100 UNIT/ML IJ SOLN
15.0000 [IU] | Freq: Three times a day (TID) | INTRAMUSCULAR | Status: DC
  Administered 2024-02-02 – 2024-02-09 (×17): 15 [IU] via SUBCUTANEOUS

## 2024-02-02 NOTE — Progress Notes (Signed)
 Daily Progress Note Intern Pager: [phone]  Patient name: Nancy [name] Medical record number: [id number] Date of birth: [birth year] Age: 58 y.o. Gender: female  Primary Care Provider: Earvin Johnston PARAS, FNP Consultants: none Code Status: FULL  Pt Overview and Major Events to Date:  08/05: Admitted for pyelonephritis 08/11: Completed abx course for pyelonephritis 08/12: Medically stable for discharge pending SNF placement  Assessment and Plan:  Nancy [name] is a 58yo F admitted for pylenophritis. Hospital course has been uneventful. Pertinent PMH/PSH includes T2DM, peripheral neuropathy, chronic bilateral hip pain s/p severe hip OA on crutches at home. Stable for discharge; barriers to discharge include no SNF offers due to insurance. Awaiting PT eval to assess ADLs for home d/c. Assessment & Plan Type 2 diabetes mellitus with hyperglycemia (HCC) A1c 14.3 this admission. Home meds: glimepiride , 20 units long acting insulin , 0-18 units for meal coverage, metformin  1000 BID - Increase Semglee  from 45u to 55u for persistent uncontrolled DM - Increase from 10 to 15 units meal time coverage  - Resistant SSI - CBG check 4x daily with meals, and at bedtime Severe malnutrition (HCC) Protein-calorie malnutrition, severe 2/2 to SDOH. Improved throughout hospital stay with increased PO intake of 75% or more of all meals. - RD following - Continue MVI, folic acid , thiamine , Vit C  Chronic health problem Substance Abuse: TOC consult, cont nicotine  patch 21 mg daily, Nicotine  4 mg gum PRN Insomnia: cont melatonin, hydroxyzine  T2DM Neuropathy: continue Cymbalta  60 mg daily, trial gabapentin  100mg  TID Pressure ulcer: superficial, present on admission. Wound care management as ordered  Constipation: daily Miralax , experienced diarrhea with fiber supplements.  OA of Hip/Low back pain: uses crutches at home, cont tylenol , ibuprofen , oxy 2.5 mg PRN; add flexeril  for muscle spasms  Alcohol use  disorder: Stable. Last drink 8/5. CIWA protocol d/c'd. Vitamin B 100 mg daily, Folic acid  1 mg daily. Hydroxyzine  25 mg 3 times daily as needed for anxiety.  FEN/GI: regular diet  PPx: Lovenox  Dispo:Home tomorrow.   Subjective:  Patient was seen and examined at bedside.  She states her vaginal itching has subsided but she is still experiencing some discharge.  She has no other complaints today.  Objective: Temp:  [97.5 F (36.4 C)-98.5 F (36.9 C)] 98.3 F (36.8 C) (08/14 0746) Pulse Rate:  [72-84] 84 (08/14 0746) Resp:  [16-18] 17 (08/14 0746) BP: (94-117)/(65-75) 100/72 (08/14 0746) SpO2:  [93 %-96 %] 94 % (08/14 0746) Weight:  [58 kg] 58 kg (08/14 0500)  Physical Exam: General: A&O, NAD Cardiac: RRR, no m/r/g Respiratory: CTAB, normal WOB, no w/c/r GI: Soft, NTTP, non-distended, bowel sounds present Extremities: NTTP, no peripheral edema. Neuro: moves all four extremities appropriately. Psych: Appropriate mood and affect   Laboratory: Most recent CBC Lab Results  Component Value Date   WBC 10.0 01/25/2024   HGB 13.3 01/25/2024   HCT 38.6 01/25/2024   MCV 91.7 01/25/2024   PLT 240 01/25/2024   Most recent BMP    Latest Ref Rng & Units 01/31/2024    6:16 AM  BMP  Glucose 70 - 99 mg/dL 703   BUN 6 - 20 mg/dL 24   Creatinine 9.55 - 1.00 mg/dL 9.53   Sodium 864 - 854 mmol/L 132   Potassium 3.5 - 5.1 mmol/L 4.5   Chloride 98 - 111 mmol/L 95   CO2 22 - 32 mmol/L 30   Calcium  8.9 - 10.3 mg/dL 9.3    Lupie Credit, DO 02/02/2024, 7:56 AM  PGY-1,  [facility] Family Medicine FPTS Intern pager: [phone], text pages welcome Secure chat group [facility] Teaching Service

## 2024-02-02 NOTE — Assessment & Plan Note (Signed)
 2/2 to SDOH. Improved throughout hospital stay with increased PO intake of 75% or more of all meals. - RD following - Continue MVI, folic acid , thiamine , Vit C

## 2024-02-02 NOTE — Progress Notes (Signed)
 Physical Therapy Treatment Patient Details Name: Nancy [name] MRN: [id number] DOB: Sep 25, 1965 Today's Date: 02/02/2024   History of Present Illness 58 y.o. female presents to [facility] 01/24/24 with multiple falls and hypotension. On CIWA protocol with sepsis 2/2 pyelonephritis. PMH of T2DM, GERD, alcohol use disorder    PT Comments  Pt resting in bed on arrival, agreeable to session and demonstrating steady progress towards acute goals. Pt continues to be limited in safe mobility by decreased activity tolerance, poor balance/postural reactions, weakness and LE pain. Pt requiring up to min A during gait this session to provide steadying assist, especially with turning as pt with noted instability with knees and trunk flexed throughout with very low bil foot clearance, placing pt at increased risk for falls. Continued education on importance of frequent mobility with pt verbalizing understanding. Patient will benefit from continued inpatient follow up therapy, <3 hours/day, will continue to follow acutely.    If plan is discharge home, recommend the following: A little help with walking and/or transfers;A little help with bathing/dressing/bathroom;Assist for transportation;Help with stairs or ramp for entrance   Can travel by private vehicle     Yes  Equipment Recommendations  None recommended by PT    Recommendations for Other Services       Precautions / Restrictions Precautions Precautions: Fall;Other (comment) Recall of Precautions/Restrictions: Intact Precaution/Restrictions Comments: watch BP Restrictions Weight Bearing Restrictions Per Provider Order: No     Mobility  Bed Mobility Overal bed mobility: Needs Assistance Bed Mobility: Supine to Sit     Supine to sit: Supervision     General bed mobility comments: pt bringing bil LEs to and off EOB without assist, keeping LEs straight and pivoting on bottom.    Transfers Overall transfer level: Needs assistance Equipment used:  Rolling walker (2 wheels), None Transfers: Sit to/from Stand, Bed to chair/wheelchair/BSC Sit to Stand: Contact guard assist           General transfer comment: CGA for safety, good recall for hand placement, increased time to come to standing with bil knees remaining flexed    Ambulation/Gait Ambulation/Gait assistance: Contact guard assist, Min assist Gait Distance (Feet): 53 Feet (x3) Assistive device: Rolling walker (2 wheels) Gait Pattern/deviations: Step-through pattern, Decreased stride length, Trunk flexed, Knee flexed in stance - right, Knee flexed in stance - left, Antalgic Gait velocity: decr     General Gait Details: pt ambulating with flexed posture with bil knees flexed in stance with low foot clearance, pt with heavy reliance on UE support to offload painful LEs, pt needing x3 standing rest breaks due to fatigue and ip to min A to steady with turning   Stairs             Wheelchair Mobility     Tilt Bed    Modified Rankin (Stroke Patients Only)       Balance Overall balance assessment: Mild deficits observed, not formally tested, History of Falls Sitting-balance support: No upper extremity supported, Feet supported Sitting balance-Leahy Scale: Good     Standing balance support: Bilateral upper extremity supported, During functional activity Standing balance-Leahy Scale: Poor Standing balance comment: reliant on RW and external support                            Communication Communication Communication: No apparent difficulties  Cognition Arousal: Alert Behavior During Therapy: WFL for tasks assessed/performed, Impulsive   PT - Cognitive impairments: Safety/Judgement  PT - Cognition Comments: slightly impulsive with pt moving quickly at times and without therapist close by as she tells PT to back up. Following commands: Intact      Cueing Cueing Techniques: Verbal cues  Exercises       General Comments        Pertinent Vitals/Pain Pain Assessment Pain Assessment: Faces Faces Pain Scale: Hurts even more Pain Location: B hips, knees, ankles Pain Descriptors / Indicators: Aching, Discomfort, Grimacing, Constant Pain Intervention(s): Patient requesting pain meds-RN notified, Monitored during session, Limited activity within patient's tolerance    Home Living                          Prior Function            PT Goals (current goals can now be found in the care plan section) Acute Rehab PT Goals Patient Stated Goal: to not have any more falls PT Goal Formulation: With patient Time For Goal Achievement: 02/08/24 Progress towards PT goals: Progressing toward goals    Frequency    Min 2X/week      PT Plan      Co-evaluation              AM-PAC PT 6 Clicks Mobility   Outcome Measure  Help needed turning from your back to your side while in a flat bed without using bedrails?: A Little Help needed moving from lying on your back to sitting on the side of a flat bed without using bedrails?: A Little Help needed moving to and from a bed to a chair (including a wheelchair)?: A Little Help needed standing up from a chair using your arms (e.g., wheelchair or bedside chair)?: A Little Help needed to walk in hospital room?: A Little Help needed climbing 3-5 steps with a railing? : A Lot 6 Click Score: 17    End of Session Equipment Utilized During Treatment:  (refused gait belt) Activity Tolerance: Patient limited by pain;Patient tolerated treatment well Patient left: in bed;with call bell/phone within reach Nurse Communication: Mobility status;Patient requests pain meds PT Visit Diagnosis: Other abnormalities of gait and mobility (R26.89);Muscle weakness (generalized) (M62.81);History of falling (Z91.81)     Time: 8549-8491 PT Time Calculation (min) (ACUTE ONLY): 18 min  Charges:    $Gait Training: 8-22 mins PT General Charges $$  ACUTE PT VISIT: 1 Visit                     Nancy [name] R. PTA Acute Rehabilitation Services Office: [phone]   [name] 02/02/2024, 3:53 PM

## 2024-02-02 NOTE — Plan of Care (Signed)
  Problem: Coping: Goal: Ability to adjust to condition or change in health will improve Outcome: Progressing   Problem: Nutritional: Goal: Maintenance of adequate nutrition will improve Outcome: Progressing Goal: Progress toward achieving an optimal weight will improve Outcome: Progressing   Problem: Skin Integrity: Goal: Risk for impaired skin integrity will decrease Outcome: Progressing   Problem: Activity: Goal: Risk for activity intolerance will decrease Outcome: Progressing   Problem: Coping: Goal: Level of anxiety will decrease Outcome: Progressing   Problem: Pain Managment: Goal: General experience of comfort will improve and/or be controlled Outcome: Progressing   Problem: Safety: Goal: Ability to remain free from injury will improve Outcome: Progressing

## 2024-02-02 NOTE — Assessment & Plan Note (Deleted)
 Vaginal discharge and irritation. Likely 2/2 recent abx courses.  - one dose of PO fluconazole  150mg  08/13

## 2024-02-02 NOTE — Plan of Care (Signed)

## 2024-02-02 NOTE — Assessment & Plan Note (Addendum)
 A1c 14.3 this admission. Home meds: glimepiride , 20 units long acting insulin , 0-18 units for meal coverage, metformin  1000 BID - Increase Semglee  from 45u to 55u for persistent uncontrolled DM - Increase from 10 to 15 units meal time coverage  - Resistant SSI - CBG check 4x daily with meals, and at bedtime

## 2024-02-02 NOTE — Progress Notes (Signed)
 Occupational Therapy Treatment Patient Details Name: Nancy [name] MRN: [id number] DOB: 1965-07-25 Today's Date: 02/02/2024   History of present illness 58 y.o. female presents to [facility] 01/24/24 with multiple falls and hypotension. On CIWA protocol with sepsis 2/2 pyelonephritis. PMH of T2DM, GERD, alcohol use disorder   OT comments  OT session focused on education in B UE AROM and theraputty exercises (see details below) to increase activity tolerance, strength, and fine motor coordination for carryover to functional tasks. B UE written HEP also provided. Pt verbalized and demonstrated understanding of all education through teach back and will benefit from reinforcement of training. OT also trained pt in use of and provided pt with sock aid for increased safety and independence with LB dressing with pt verbalizing understanding and demonstrating ability donn socks with use of sock aid with Contact guard-Min assist. Pt currently largely completing ADLs with Set up to Min-Mod assist. Pt is making progress toward OT goals. Pt will benefit from continued acute skilled OT services to address deficits and increase safety and independence with functional tasks. Post acute discharge, pt will benefit from intensive inpatient skilled rehab services to increase safety and independence with functional tasks, decrease risk of falls, and decrease risk of rehospitalization.       If plan is discharge home, recommend the following:  A little help with walking and/or transfers;A little help with bathing/dressing/bathroom;Assistance with cooking/housework;Assist for transportation;Help with stairs or ramp for entrance   Equipment Recommendations  Other (comment) (defer to next level of care)    Recommendations for Other Services      Precautions / Restrictions Precautions Precautions: Fall;Other (comment) Recall of Precautions/Restrictions: Intact Precaution/Restrictions Comments: watch BP Restrictions Weight  Bearing Restrictions Per Provider Order: No       Mobility Bed Mobility Overal bed mobility: Needs Assistance Bed Mobility: Supine to Sit, Sit to Supine     Supine to sit: Supervision Sit to supine: Supervision   General bed mobility comments: with increased time    Transfers Overall transfer level: Needs assistance                 General transfer comment: Pt declined OOB activity this session due to B LE pain     Balance Overall balance assessment: Mild deficits observed, not formally tested, History of Falls Sitting-balance support: No upper extremity supported, Feet supported Sitting balance-Leahy Scale: Good                                     ADL either performed or assessed with clinical judgement   ADL Overall ADL's : Needs assistance/impaired                     Lower Body Dressing: Contact guard assist;Minimal assistance;With adaptive equipment;Cueing for compensatory techniques;Sitting/lateral leans Lower Body Dressing Details (indicate cue type and reason): to donn/doff socks with use of sock aid               General ADL Comments: pt declining addressing ADLs further this session due to B LE pain    Extremity/Trunk Assessment Upper Extremity Assessment Upper Extremity Assessment: Generalized weakness;RUE deficits/detail;LUE deficits/detail;Right hand dominant RUE Deficits / Details: generalized weakness LUE Deficits / Details: generalized weakness; decreased fine motor coordination LUE Coordination: decreased fine motor   Lower Extremity Assessment Lower Extremity Assessment: Defer to PT evaluation        Vision  Perception     Praxis     Communication Communication Communication: No apparent difficulties   Cognition Arousal: Alert Behavior During Therapy: WFL for tasks assessed/performed, Impulsive Cognition: Cognition impaired, No family/caregiver present to determine baseline     Awareness:  Intellectual awareness intact, Online awareness intact (online awareness intermittently intact)   Attention impairment (select first level of impairment): Divided attention Executive functioning impairment (select all impairments): Problem solving OT - Cognition Comments: AAOx4 and pleasant throughout session. Cogntiion largely [facility] for tasks assessed. However, pt with noted deficits as decribed above.                 Following commands: Intact        Cueing   Cueing Techniques: Verbal cues  Exercises Exercises: Other exercises, General Upper Extremity General Exercises - Upper Extremity Shoulder Flexion: AROM, Both, 10 reps, Strengthening Shoulder Extension: AROM, Both, 10 reps, Strengthening Shoulder Horizontal ABduction: AROM, Both, 10 reps, Strengthening Shoulder Horizontal ADduction: AROM, Both, 10 reps, Strengthening Elbow Flexion: AROM, Both, 10 reps, Strengthening Elbow Extension: AROM, Both, 10 reps, Strengthening Chair Push Up: AROM, Both, 5 reps, Strengthening Other Exercises Other Exercises: Pt educated in and provided written handout of AROM HEP with pt demonstrating understanding through teach back (Medbridge access code: [email or web address]) Other Exercises: Pt educated in and provided handout in B hand exercises with yellow/red theraputty for increased strength and fine motor coordiantion for carryover to funcitonal tasks. Pt demonstrated understanding through teach back.    Shoulder Instructions       General Comments OT provided and trained pt in use of sock aid this session with pt verbalizing and demonstrating understanding through teach back. Pt will benefit from reinforcement of education.    Pertinent Vitals/ Pain       Pain Assessment Pain Assessment: 0-10 Pain Score: 8  Pain Location: B hips, knees, ankles Pain Descriptors / Indicators: Discomfort, Grimacing, Constant, Burning, Shooting, Tingling Pain Intervention(s): Limited activity within patient's  tolerance, Monitored during session, Repositioned, Patient requesting pain meds-RN notified  Home Living                                          Prior Functioning/Environment              Frequency  Min 2X/week        Progress Toward Goals  OT Goals(current goals can now be found in the care plan section)  Progress towards OT goals: Progressing toward goals  Acute Rehab OT Goals Patient Stated Goal: to have less pain, stop falling, and get stronger  Plan      Co-evaluation                 AM-PAC OT 6 Clicks Daily Activity     Outcome Measure   Help from another person eating meals?: None Help from another person taking care of personal grooming?: A Little Help from another person toileting, which includes using toliet, bedpan, or urinal?: A Little Help from another person bathing (including washing, rinsing, drying)?: A Little Help from another person to put on and taking off regular upper body clothing?: A Little Help from another person to put on and taking off regular lower body clothing?: A Little 6 Click Score: 19    End of Session Equipment Utilized During Treatment: Other (comment) (sock aid, theraputty (yellow and red))  OT Visit Diagnosis: Repeated falls (R29.6);History  of falling (Z91.81);Muscle weakness (generalized) (M62.81);Ataxia, unspecified (R27.0)   Activity Tolerance Patient tolerated treatment well;Patient limited by pain   Patient Left in bed;with call bell/phone within reach;with bed alarm set   Nurse Communication Mobility status;Patient requests pain meds        Time: 8364-8341 OT Time Calculation (min): 23 min  Charges: OT General Charges $OT Visit: 1 Visit OT Treatments $Self Care/Home Management : 8-22 mins $Therapeutic Exercise: 8-22 mins  Margarie Rockey HERO., OTR/L, MA Acute Rehab [phone]   Margarie FORBES Horns 02/02/2024, 6:14 PM

## 2024-02-02 NOTE — Assessment & Plan Note (Signed)
 Substance Abuse: TOC consult, cont nicotine  patch 21 mg daily, Nicotine  4 mg gum PRN Insomnia: cont melatonin, hydroxyzine  T2DM Neuropathy: continue Cymbalta  60 mg daily, trial gabapentin  100mg  TID Pressure ulcer: superficial, present on admission. Wound care management as ordered  Constipation: daily Miralax , experienced diarrhea with fiber supplements.  OA of Hip/Low back pain: uses crutches at home, cont tylenol , ibuprofen , oxy 2.5 mg PRN; add flexeril  for muscle spasms  Alcohol use disorder: Stable. Last drink 8/5. CIWA protocol d/c'd. Vitamin B 100 mg daily, Folic acid  1 mg daily. Hydroxyzine  25 mg 3 times daily as needed for anxiety.

## 2024-02-03 DIAGNOSIS — N12 Tubulo-interstitial nephritis, not specified as acute or chronic: Secondary | ICD-10-CM | POA: Diagnosis not present

## 2024-02-03 LAB — GLUCOSE, CAPILLARY
Glucose-Capillary: 299 mg/dL — ABNORMAL HIGH (ref 70–99)
Glucose-Capillary: 322 mg/dL — ABNORMAL HIGH (ref 70–99)
Glucose-Capillary: 228 mg/dL — ABNORMAL HIGH (ref 70–99)
Glucose-Capillary: 254 mg/dL — ABNORMAL HIGH (ref 70–99)

## 2024-02-03 LAB — RAPID URINE DRUG SCREEN, HOSP PERFORMED
Amphetamines: NOT DETECTED
Barbiturates: NOT DETECTED
Benzodiazepines: NOT DETECTED
Cocaine: NOT DETECTED
Opiates: NOT DETECTED
Tetrahydrocannabinol: NOT DETECTED

## 2024-02-03 LAB — MAGNESIUM: Magnesium: 1.5 mg/dL — ABNORMAL LOW (ref 1.7–2.4)

## 2024-02-03 MED ORDER — IBUPROFEN 800 MG PO TABS
800.0000 mg | ORAL_TABLET | Freq: Four times a day (QID) | ORAL | Status: AC
  Administered 2024-02-03 – 2024-02-05 (×8): 800 mg via ORAL
  Filled 2024-02-03 (×8): qty 1

## 2024-02-03 MED ORDER — METHOCARBAMOL 750 MG PO TABS
750.0000 mg | ORAL_TABLET | Freq: Four times a day (QID) | ORAL | Status: DC | PRN
  Administered 2024-02-03 – 2024-02-16 (×20): 750 mg via ORAL
  Filled 2024-02-03 (×20): qty 1

## 2024-02-03 MED ORDER — MAGNESIUM SULFATE 2 GM/50ML IV SOLN
2.0000 g | Freq: Once | INTRAVENOUS | Status: AC
  Administered 2024-02-03: 2 g via INTRAVENOUS
  Filled 2024-02-03: qty 50

## 2024-02-03 NOTE — Assessment & Plan Note (Signed)
 2/2 to SDOH. Improved throughout hospital stay with increased PO intake of 75% or more of all meals. - RD following - Continue MVI, folic acid , thiamine , Vit C

## 2024-02-03 NOTE — Assessment & Plan Note (Signed)
 Substance Abuse: TOC consult, cont nicotine  patch 21 mg daily, Nicotine  4 mg gum PRN Insomnia: cont melatonin, hydroxyzine  T2DM Neuropathy: continue Cymbalta  60 mg daily, trial gabapentin  100mg  TID Pressure ulcer: superficial, present on admission. Wound care management as ordered  Constipation: daily Miralax , experienced diarrhea with fiber supplements.  OA of Hip/Low back pain: uses crutches at home, cont tylenol , ibuprofen , oxy 2.5 mg PRN; add flexeril  for muscle spasms  Alcohol use disorder: Stable. Last drink 8/5. CIWA protocol d/c'd. Vitamin B 100 mg daily, Folic acid  1 mg daily. Hydroxyzine  25 mg 3 times daily as needed for anxiety.

## 2024-02-03 NOTE — Assessment & Plan Note (Addendum)
 A1c 14.3 this admission. Home meds: glimepiride , 20 units long acting insulin , 0-18 units for meal coverage, metformin  1000 BID - Continue Semglee  55u for persistent uncontrolled DM inpatient - Continue 15 units meal time coverage inpatient - Resistant SSI - CBG check 4x daily with meals, and at bedtime

## 2024-02-03 NOTE — Inpatient Diabetes Management (Signed)
 Inpatient Diabetes Program Recommendations  AACE/ADA: New Consensus Statement on Inpatient Glycemic Control (2015)  Target Ranges:  Prepandial:   less than 140 mg/dL      Peak postprandial:   less than 180 mg/dL (1-2 hours)      Critically ill patients:  140 - 180 mg/dL   Lab Results  Component Value Date   GLUCAP 322 (H) 02/03/2024   HGBA1C 14.3 (H) 01/24/2024    Review of Glycemic Control  Latest Reference Range & Units 01/30/24 09:15 01/30/24 11:40 01/30/24 17:11 01/30/24 20:31 01/31/24 08:07  Glucose-Capillary 70 - 99 mg/dL 763 (H) 883 (H) 820 (H) 260 (H) 322 (H)   Diabetes history: DM 2 Outpatient Diabetes medications: Lantus  20 units, Novolog  0-18 units tid, Amaryl  2 mg Daily, Metformin  1000 mg bid Current orders for Inpatient glycemic control:  Semglee  55 units Novolog  0-20 units + hs Novolog  15 units tid meal coverage  Glucerna tid between meals  Spoke with pt at bedside regarding insulin  regimen for home including basal insulin  once daily, Novolog  correction scale and adding meal coverage with it. Pt understands information. Pt has used insulin  pen in the past.   MD Note: Pt did not receive meal coverage for breakfast, lunchtime glucose 300's. RN waiting for pt to eat >50% for lunch will administer meal coverage after Pt eats.  Thanks, Clotilda Bull RN, MSN, BC-ADM Inpatient Diabetes Coordinator Team Pager [phone] (8a-5p)

## 2024-02-03 NOTE — Progress Notes (Signed)
 Physical Therapy Treatment Patient Details Name: Nancy [name] MRN: [id number] DOB: 1965/12/31 Today's Date: 02/03/2024   History of Present Illness 58 y.o. female presents to [facility] 01/24/24 with multiple falls and hypotension. On CIWA protocol with sepsis 2/2 pyelonephritis. PMH of T2DM, GERD, alcohol use disorder    PT Comments  Pt resting in bed on arrival, agreeable to session with steady progress towards acute goals, despite continued c/o LE pain. Pt requiring up to min A to maintain balance this session during gait as pt with x1 LOB with turning needing assist to regain balance and prevent falling. Pt was educated on continued walker use to maximize functional independence, safety, and decrease risk for falls. Pt continues to be limited in safe mobility by decreased activity tolerance, poor balance/postural reactions and LE weakness. Current plan remains appropriate to address deficits and maximize functional independence and safety. Pt continues to benefit from skilled PT services to progress toward functional mobility goals.      If plan is discharge home, recommend the following: A little help with walking and/or transfers;A little help with bathing/dressing/bathroom;Assist for transportation;Help with stairs or ramp for entrance   Can travel by private vehicle     Yes  Equipment Recommendations  None recommended by PT    Recommendations for Other Services       Precautions / Restrictions Precautions Precautions: Fall;Other (comment) Recall of Precautions/Restrictions: Intact Precaution/Restrictions Comments: watch BP Restrictions Weight Bearing Restrictions Per Provider Order: No     Mobility  Bed Mobility Overal bed mobility: Needs Assistance Bed Mobility: Supine to Sit, Sit to Supine     Supine to sit: Supervision Sit to supine: Supervision   General bed mobility comments: with increased time    Transfers Overall transfer level: Needs assistance Equipment used:  Rolling walker (2 wheels), None Transfers: Sit to/from Stand Sit to Stand: Contact guard assist           General transfer comment: CGA for safety, good recall for hand placement, increased time to come to standing with bil knees remaining flexed    Ambulation/Gait Ambulation/Gait assistance: Contact guard assist, Min assist Gait Distance (Feet): 55 Feet (x2) Assistive device: Rolling walker (2 wheels) Gait Pattern/deviations: Step-to pattern, Decreased stride length, Decreased dorsiflexion - right, Decreased dorsiflexion - left, Knee flexed in stance - right, Knee flexed in stance - left, Antalgic Gait velocity: decr     General Gait Details: pt ambulating with flexed posture with bil knees flexed in stance with low foot clearance, pt with heavy reliance on UE support to offload painful LEs, pt needing x2 standing rest breaks due to fatigue. x1 lateral LOB with turning needing min A to prevent fall   Stairs             Wheelchair Mobility     Tilt Bed    Modified Rankin (Stroke Patients Only)       Balance Overall balance assessment: Mild deficits observed, not formally tested, History of Falls Sitting-balance support: No upper extremity supported, Feet supported Sitting balance-Leahy Scale: Good     Standing balance support: Bilateral upper extremity supported, During functional activity Standing balance-Leahy Scale: Poor Standing balance comment: reliant on RW and external support                            Communication Communication Communication: No apparent difficulties  Cognition Arousal: Alert Behavior During Therapy: WFL for tasks assessed/performed, Impulsive  Following commands: Intact      Cueing Cueing Techniques: Verbal cues  Exercises      General Comments        Pertinent Vitals/Pain Pain Assessment Pain Assessment: Faces Faces Pain Scale: Hurts little more Pain Location: B  hips, knees, ankles, RLE from muscle spasm/cramping Pain Descriptors / Indicators: Discomfort, Grimacing, Constant, Burning, Shooting, Tingling Pain Intervention(s): Monitored during session, Limited activity within patient's tolerance    Home Living                          Prior Function            PT Goals (current goals can now be found in the care plan section) Acute Rehab PT Goals PT Goal Formulation: With patient Time For Goal Achievement: 02/08/24 Progress towards PT goals: Progressing toward goals    Frequency    Min 2X/week      PT Plan      Co-evaluation              AM-PAC PT 6 Clicks Mobility   Outcome Measure  Help needed turning from your back to your side while in a flat bed without using bedrails?: A Little Help needed moving from lying on your back to sitting on the side of a flat bed without using bedrails?: A Little Help needed moving to and from a bed to a chair (including a wheelchair)?: A Little Help needed standing up from a chair using your arms (e.g., wheelchair or bedside chair)?: A Little Help needed to walk in hospital room?: A Little Help needed climbing 3-5 steps with a railing? : A Lot 6 Click Score: 17    End of Session   Activity Tolerance: Patient tolerated treatment well;Patient limited by fatigue;Patient limited by pain Patient left: in bed;with call bell/phone within reach;Other (comment) (with MD present) Nurse Communication: Mobility status PT Visit Diagnosis: Other abnormalities of gait and mobility (R26.89);Muscle weakness (generalized) (M62.81);History of falling (Z91.81)     Time: 1455-1510 PT Time Calculation (min) (ACUTE ONLY): 15 min  Charges:    $Gait Training: 8-22 mins PT General Charges $$ ACUTE PT VISIT: 1 Visit                     Sylvie Mifsud R. PTA Acute Rehabilitation Services Office: [phone]   [name] 02/03/2024, 4:02 PM

## 2024-02-03 NOTE — Progress Notes (Signed)
 Daily Progress Note Intern Pager: [phone]  Patient name: Nancy [name] Medical record number: [id number] Date of birth: [birth year] Age: 58 y.o. Gender: female  Primary Care Provider: Earvin Johnston PARAS, FNP Consultants: none Code Status: FULL  Pt Overview and Major Events to Date:  08/05: Admitted for pyelonephritis 08/11: Completed abx course for pyelonephritis 08/12: Medically stable for discharge pending SNF placement  Assessment and Plan:  Nancy [name] is a 58yo F admitted for pylenophritis. Hospital course has been uneventful. Pertinent PMH/PSH includes T2DM, peripheral neuropathy, chronic bilateral hip pain s/p severe hip OA on crutches at home. Stable for discharge; barriers to discharge include no SNF offers due to insurance.  Assessment & Plan Type 2 diabetes mellitus with hyperglycemia (HCC) A1c 14.3 this admission. Home meds: glimepiride , 20 units long acting insulin , 0-18 units for meal coverage, metformin  1000 BID - Continue Semglee  55u for persistent uncontrolled DM inpatient - Continue 15 units meal time coverage inpatient - Resistant SSI - CBG check 4x daily with meals, and at bedtime Severe malnutrition (HCC) Protein-calorie malnutrition, severe 2/2 to SDOH. Improved throughout hospital stay with increased PO intake of 75% or more of all meals. - RD following - Continue MVI, folic acid , thiamine , Vit C  Chronic health problem Substance Abuse: TOC consult, cont nicotine  patch 21 mg daily, Nicotine  4 mg gum PRN Insomnia: cont melatonin, hydroxyzine  T2DM Neuropathy: continue Cymbalta  60 mg daily, trial gabapentin  100mg  TID Pressure ulcer: superficial, present on admission. Wound care management as ordered  Constipation: daily Miralax , experienced diarrhea with fiber supplements.  OA of Hip/Low back pain: uses crutches at home, cont tylenol , ibuprofen , oxy 2.5 mg PRN; add flexeril  for muscle spasms  Alcohol use disorder: Stable. Last drink 8/5. CIWA protocol  d/c'd. Vitamin B 100 mg daily, Folic acid  1 mg daily. Hydroxyzine  25 mg 3 times daily as needed for anxiety.  FEN/GI: refular diet PPx: Lovenox  Dispo:Home or SNF pending placement. Barriers include no bed offers d/t insurance.   Subjective:  Patient was seen and examined at bedside.  She does report a muscle cramp in her right thigh overnight with some pain currently, but has not received her morning pain medication yet.  Advised to wait for her medication to kick in.  No other complaints.  Objective: Temp:  [98.1 F (36.7 C)-99 F (37.2 C)] 99 F (37.2 C) (08/14 2051) Pulse Rate:  [78-89] 78 (08/15 0448) Resp:  [16-17] 16 (08/14 2051) BP: (93-113)/(59-73) 103/71 (08/15 0448) SpO2:  [86 %-100 %] 97 % (08/15 0448) Weight:  [58.1 kg] 58.1 kg (08/15 0500)  Physical Exam: General: A&O, NAD Cardiac: RRR, no m/r/g Respiratory: CTAB, normal WOB, no w/c/r GI: Soft, NTTP, non-distended  Extremities: NTTP, no peripheral edema. Neuro: moves all four extremities appropriately. Psych: Appropriate mood and affect  Laboratory: Most recent CBC Lab Results  Component Value Date   WBC 10.0 01/25/2024   HGB 13.3 01/25/2024   HCT 38.6 01/25/2024   MCV 91.7 01/25/2024   PLT 240 01/25/2024   Most recent BMP    Latest Ref Rng & Units 01/31/2024    6:16 AM  BMP  Glucose 70 - 99 mg/dL 703   BUN 6 - 20 mg/dL 24   Creatinine 9.55 - 1.00 mg/dL 9.53   Sodium 864 - 854 mmol/L 132   Potassium 3.5 - 5.1 mmol/L 4.5   Chloride 98 - 111 mmol/L 95   CO2 22 - 32 mmol/L 30   Calcium  8.9 - 10.3 mg/dL 9.3  Lupie Credit, DO 02/03/2024, 7:07 AM  PGY-1, [facility] FPTS Intern pager: [phone], text pages welcome Secure chat group [facility] Teaching Service

## 2024-02-03 NOTE — Plan of Care (Addendum)
 Evaluated patient at bedside with Dr. Alena.  Patient states that she was getting kicked out of her house at the end of this month.  She had not planned where she was going to go after this.  States she has no family or friends that can help her.  She is interested in SNF. After talking with PT, it sounds like patient is making progress with physical therapy. She is feeling anxious.  Will give one of her as needed hydroxyzine .  Will also schedule ibuprofen  for the next 2 days for her chronic lower extremity pain.

## 2024-02-03 NOTE — TOC Progression Note (Signed)
 Transition of Care [facility]) - Progression Note    Patient Details  Name: Nancy [name] MRN: [id number] Date of Birth: June 05, 1966  Transition of [facility] CM/SW Contact  Gwenn Frieze [place], [place] Phone Number: 02/03/2024, 1:49 PM  Clinical Narrative:  SW continuing to follow for SNF placement. 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 [facility] is potentially able to offer a bed pending updated negative UDS. MD updated. UDS is pending.   Frieze Gwenn, MSW, LCSW [phone] (coverage)      Expected Discharge Plan: Skilled Nursing Facility Barriers to Discharge: Continued Medical Work up, English as a second language teacher, SNF Pending bed offer               Expected Discharge Plan and Services       Living arrangements for the past 2 months: Single Family Home                                       Social Drivers of Health (SDOH) Interventions SDOH Screenings   Food Insecurity: Food Insecurity Present (01/24/2024)  Housing: High Risk (01/24/2024)  Transportation Needs: Unmet Transportation Needs (01/24/2024)  Utilities: At Risk (01/24/2024)  Financial Resource Strain: Not on File (10/08/2021)   Received from [facility]  Physical Activity: Not on File (10/08/2021)   Received from [facility]  Social Connections: Socially Isolated (10/26/2023)  Stress: Not on File (10/08/2021)   [facility] from [facility]  Tobacco Use: High Risk (01/24/2024)    Readmission Risk Interventions     No data to display

## 2024-02-04 DIAGNOSIS — N12 Tubulo-interstitial nephritis, not specified as acute or chronic: Secondary | ICD-10-CM | POA: Diagnosis not present

## 2024-02-04 LAB — GLUCOSE, CAPILLARY
Glucose-Capillary: 284 mg/dL — ABNORMAL HIGH (ref 70–99)
Glucose-Capillary: 111 mg/dL — ABNORMAL HIGH (ref 70–99)
Glucose-Capillary: 121 mg/dL — ABNORMAL HIGH (ref 70–99)
Glucose-Capillary: 227 mg/dL — ABNORMAL HIGH (ref 70–99)

## 2024-02-04 LAB — MAGNESIUM: Magnesium: 1.7 mg/dL (ref 1.7–2.4)

## 2024-02-04 MED ORDER — POLYETHYLENE GLYCOL 3350 17 G PO PACK
17.0000 g | PACK | Freq: Every day | ORAL | Status: DC
  Administered 2024-02-05 – 2024-02-11 (×5): 17 g via ORAL
  Filled 2024-02-04 (×8): qty 1

## 2024-02-04 MED ORDER — INSULIN GLARGINE 100 UNIT/ML ~~LOC~~ SOLN
60.0000 [IU] | SUBCUTANEOUS | Status: DC
  Administered 2024-02-04 – 2024-02-12 (×9): 60 [IU] via SUBCUTANEOUS
  Filled 2024-02-04 (×10): qty 0.6

## 2024-02-04 MED ORDER — INSULIN GLARGINE-YFGN 100 UNIT/ML ~~LOC~~ SOLN: 60.0000 [IU] | SUBCUTANEOUS | Status: DC

## 2024-02-04 NOTE — Assessment & Plan Note (Signed)
 2/2 to SDOH. Improved throughout hospital stay with increased PO intake of 75% or more of all meals. - RD following - Continue MVI, folic acid , thiamine , Vit C

## 2024-02-04 NOTE — Assessment & Plan Note (Addendum)
 A1c 14.3 this admission. Home meds: glimepiride , 20 units long acting insulin , 0-18 units for meal coverage, metformin  1000 BID - Increase Semglee  to 60u - Continue 15 units meal time coverage inpatient - Resistant SSI - CBG check 4x daily with meals, and at bedtime

## 2024-02-04 NOTE — Progress Notes (Addendum)
 Daily Progress Note Intern Pager: [phone]  Patient name: Nancy [name] Medical record number: [id number] Date of birth: 10-03-1965 Age: 58 y.o. Gender: female  Primary Care Provider: Earvin Johnston PARAS, FNP Consultants: None Code Status: FULL  Pt Overview and Major Events to Date:  8/5: admitted for pyelo 8/11: completed abx course for pyelo 8/12: medically stable for dc pending SNF  Medical Decision Making:  Nancy [name] is a 58yo F admitted for pylenophritis. Hospital course has been uneventful. Pertinent PMH/PSH includes T2DM, peripheral neuropathy, chronic bilateral hip pain s/p severe hip OA on crutches at home. Stable for discharge; barriers to discharge include no SNF offers.  Assessment & Plan Type 2 diabetes mellitus with hyperglycemia and neuropathy (HCC) A1c 14.3 this admission. Neuropathy uncontrolled. Home meds: glimepiride , 20 units long acting insulin , 0-18 units for meal coverage, metformin  1000 BID - Continue Semglee  to 57u - Continue 15 units meal time coverage inpatient - Resistant SSI - CBG check 4x daily with meals, and at bedtime - Increase gabapentin  to 400 mg TID Severe malnutrition (HCC) Protein-calorie malnutrition, severe 2/2 to SDOH. Improved throughout hospital stay with increased PO intake. - RD following - Continue MVI, folic acid , thiamine , Vit C  Chronic health problem Substance Abuse: TOC consulted, cont nicotine  patch 21 mg daily, Nicotine  4 mg gum PRN. UDS 8/15 negative. Insomnia: cont melatonin, hydroxyzine  T2DM Neuropathy: continue Cymbalta  60 mg daily, gabapentin  300mg  TID,  Pressure ulcer: superficial, present on admission. Wound care management as ordered  Constipation: daily Miralax , experienced diarrhea with fiber supplements.  OA of Hip/Low back pain: uses crutches at home, cont tylenol , ibuprofen , oxy 2.5 mg PRN;  Alcohol use disorder: Stable. Last drink 8/5. CIWA protocol d/c'd. Vitamin B 100 mg daily, Folic acid  1 mg daily.  Hydroxyzine  25 mg 3 times daily as needed for anxiety.  FEN/GI: regular diet PPx: lovenox  Dispo:SNF pending bed availability. Barriers include bed availability.  Subjective:  Has been having more recent intermittent numbness, sharp electric like pain, and cramping in her bilateral legs, R>L. This has been going on for a while but worsening while here in hospital. Opioids help but not as much as they used to.  Objective: Temp:  [97.7 F (36.5 C)-98.2 F (36.8 C)] 97.7 F (36.5 C) (08/16 1536) Pulse Rate:  [81-96] 91 (08/16 1536) Resp:  [16-20] 20 (08/16 1536) BP: (94-115)/(64-75) 115/69 (08/16 1536) SpO2:  [91 %-97 %] 97 % (08/16 1536) Weight:  [58.1 kg] 58.1 kg (08/16 0433) Physical Exam: General: Alert and oriented, NAD Cardiovascular: RRR, no m/r/g Respiratory: CTAB, no w/r/r Abdomen: Soft, nontender, nondistended, normoactive bowel sounds Extremities: BLE with dry skin, DP pulse 2+ on R side, ROM grossly normal, no increased edema/erythema of either LE  Laboratory: Most recent CBC Lab Results  Component Value Date   WBC 10.0 01/25/2024   HGB 13.3 01/25/2024   HCT 38.6 01/25/2024   MCV 91.7 01/25/2024   PLT 240 01/25/2024   Most recent BMP    Latest Ref Rng & Units 01/31/2024    6:16 AM  BMP  Glucose 70 - 99 mg/dL 703   BUN 6 - 20 mg/dL 24   Creatinine 9.55 - 1.00 mg/dL 9.53   Sodium 864 - 854 mmol/L 132   Potassium 3.5 - 5.1 mmol/L 4.5   Chloride 98 - 111 mmol/L 95   CO2 22 - 32 mmol/L 30   Calcium  8.9 - 10.3 mg/dL 9.3    Tharon Lung, MD 02/04/2024, 7:17 PM  PGY-3,  [facility] Family Medicine FPTS Intern pager: [phone], text pages welcome Secure chat group [facility] Teaching Service

## 2024-02-04 NOTE — Assessment & Plan Note (Signed)
 Switched flexeril  to robaxin  8/15. Consider increase in gabapentin  tomorrow if still having pain.

## 2024-02-04 NOTE — Assessment & Plan Note (Signed)
 Substance Abuse: TOC consulted, cont nicotine  patch 21 mg daily, Nicotine  4 mg gum PRN. UDS 8/15 negative. Insomnia: cont melatonin, hydroxyzine  T2DM Neuropathy: continue Cymbalta  60 mg daily, gabapentin  300mg  TID,  Pressure ulcer: superficial, present on admission. Wound care management as ordered  Constipation: daily Miralax , experienced diarrhea with fiber supplements.  OA of Hip/Low back pain: uses crutches at home, cont tylenol , ibuprofen , oxy 2.5 mg PRN;  Alcohol use disorder: Stable. Last drink 8/5. CIWA protocol d/c'd. Vitamin B 100 mg daily, Folic acid  1 mg daily. Hydroxyzine  25 mg 3 times daily as needed for anxiety.

## 2024-02-04 NOTE — Progress Notes (Addendum)
 Daily Progress Note Intern Pager: [phone]  Patient name: Nancy [name] Medical record number: [id number] Date of birth: 06/01/1966 Age: 58 y.o. Gender: female  Primary Care Provider: Earvin Johnston PARAS, FNP Consultants: none Code Status: FULL  Pt Overview and Major Events to Date:  08/05: Admitted for pyelonephritis 08/11: Completed abx course for pyelonephritis 08/12: Medically stable for discharge pending SNF placement  Medical Decision Making:  Nancy [name] is a 58yo F admitted for pylenophritis. Hospital course has been uneventful. Pertinent PMH/PSH includes T2DM, peripheral neuropathy, chronic bilateral hip pain s/p severe hip OA on crutches at home. Stable for discharge; barriers to discharge include no SNF offers. Updated UDS to assist with offers. Assessment & Plan Type 2 diabetes mellitus with hyperglycemia (HCC) A1c 14.3 this admission. Home meds: glimepiride , 20 units long acting insulin , 0-18 units for meal coverage, metformin  1000 BID - Increase Semglee  to 60u - Continue 15 units meal time coverage inpatient - Resistant SSI - CBG check 4x daily with meals, and at bedtime Severe malnutrition (HCC) Protein-calorie malnutrition, severe 2/2 to SDOH. Improved throughout hospital stay with increased PO intake of 75% or more of all meals. - RD following - Continue MVI, folic acid , thiamine , Vit C  Chronic health problem Substance Abuse: TOC consult, cont nicotine  patch 21 mg daily, Nicotine  4 mg gum PRN. UDS 8/15 negative. Insomnia: cont melatonin, hydroxyzine  T2DM Neuropathy: continue Cymbalta  60 mg daily, gabapentin  300mg  TID,  Pressure ulcer: superficial, present on admission. Wound care management as ordered  Constipation: daily Miralax , experienced diarrhea with fiber supplements.  OA of Hip/Low back pain: uses crutches at home, cont tylenol , ibuprofen , oxy 2.5 mg PRN;  Alcohol use disorder: Stable. Last drink 8/5. CIWA protocol d/c'd. Vitamin B 100 mg daily,  Folic acid  1 mg daily. Hydroxyzine  25 mg 3 times daily as needed for anxiety.   FEN/GI: regular diet PPx: lovenox  Dispo:SNF pending bed availability. Barriers include bed availability.   Subjective:  Patient was up ambulating the room during rounds this morning. She denies any new complaints other than R hip muscle spasms. She says the medication she receives around 2am helps with this pain. No other concerns at this time.  Objective: Temp:  [98 F (36.7 C)-98.2 F (36.8 C)] 98.1 F (36.7 C) (08/16 0315) Pulse Rate:  [79-96] 81 (08/16 0315) Resp:  [16] 16 (08/16 0315) BP: (94-113)/(60-76) 113/64 (08/16 0315) SpO2:  [91 %-96 %] 91 % (08/16 0315) Weight:  [128 lb 1.4 oz (58.1 kg)] 128 lb 1.4 oz (58.1 kg) (08/16 0433) Physical Exam: General: ambulating across room comfortably with walker, NAD Cardiovascular: RRR, no r/m/g Respiratory: CTAB  Laboratory: Most recent CBC Lab Results  Component Value Date   WBC 10.0 01/25/2024   HGB 13.3 01/25/2024   HCT 38.6 01/25/2024   MCV 91.7 01/25/2024   PLT 240 01/25/2024   Most recent BMP    Latest Ref Rng & Units 01/31/2024    6:16 AM  BMP  Glucose 70 - 99 mg/dL 703   BUN 6 - 20 mg/dL 24   Creatinine 9.55 - 1.00 mg/dL 9.53   Sodium 864 - 854 mmol/L 132   Potassium 3.5 - 5.1 mmol/L 4.5   Chloride 98 - 111 mmol/L 95   CO2 22 - 32 mmol/L 30   Calcium  8.9 - 10.3 mg/dL 9.3     Nancy Nakai, DO 02/04/2024, 6:22 AM  PGY-1, [place] [facility] FPTS Intern pager: [phone], text pages welcome Secure chat group [facility] Teaching Service    I have personally seen and examined this patient and reviewed their chart. I have discussed this patient with the resident. I agree with the assessment and plan as outlined with my edits above.   Nancy Lai, DO Family Medicine Teaching Service

## 2024-02-04 NOTE — Assessment & Plan Note (Addendum)
 A1c 14.3 this admission. Neuropathy uncontrolled. Home meds: glimepiride , 20 units long acting insulin , 0-18 units for meal coverage, metformin  1000 BID - Continue Semglee  to 60u - Continue 15 units meal time coverage inpatient - Resistant SSI - CBG check 4x daily with meals, and at bedtime - Increase gabapentin  to 400 mg TID

## 2024-02-04 NOTE — Assessment & Plan Note (Addendum)
 Substance Abuse: TOC consult, cont nicotine  patch 21 mg daily, Nicotine  4 mg gum PRN. UDS 8/15 negative. Insomnia: cont melatonin, hydroxyzine  T2DM Neuropathy: continue Cymbalta  60 mg daily, gabapentin  300mg  TID,  Pressure ulcer: superficial, present on admission. Wound care management as ordered  Constipation: daily Miralax , experienced diarrhea with fiber supplements.  OA of Hip/Low back pain: uses crutches at home, cont tylenol , ibuprofen , oxy 2.5 mg PRN;  Alcohol use disorder: Stable. Last drink 8/5. CIWA protocol d/c'd. Vitamin B 100 mg daily, Folic acid  1 mg daily. Hydroxyzine  25 mg 3 times daily as needed for anxiety.

## 2024-02-04 NOTE — Assessment & Plan Note (Signed)
 2/2 to SDOH. Improved throughout hospital stay with increased PO intake. - RD following - Continue MVI, folic acid , thiamine , Vit C

## 2024-02-05 DIAGNOSIS — N12 Tubulo-interstitial nephritis, not specified as acute or chronic: Secondary | ICD-10-CM | POA: Diagnosis not present

## 2024-02-05 LAB — GLUCOSE, CAPILLARY
Glucose-Capillary: 346 mg/dL — ABNORMAL HIGH (ref 70–99)
Glucose-Capillary: 102 mg/dL — ABNORMAL HIGH (ref 70–99)
Glucose-Capillary: 185 mg/dL — ABNORMAL HIGH (ref 70–99)
Glucose-Capillary: 153 mg/dL — ABNORMAL HIGH (ref 70–99)

## 2024-02-05 MED ORDER — GABAPENTIN 400 MG PO CAPS
400.0000 mg | ORAL_CAPSULE | Freq: Three times a day (TID) | ORAL | Status: DC
  Administered 2024-02-05: 400 mg via ORAL
  Filled 2024-02-05: qty 1

## 2024-02-05 MED ORDER — GABAPENTIN 300 MG PO CAPS
600.0000 mg | ORAL_CAPSULE | Freq: Every day | ORAL | Status: DC
  Administered 2024-02-05: 600 mg via ORAL
  Filled 2024-02-05: qty 2

## 2024-02-05 MED ORDER — GABAPENTIN 400 MG PO CAPS
400.0000 mg | ORAL_CAPSULE | Freq: Two times a day (BID) | ORAL | Status: DC
  Administered 2024-02-05 – 2024-02-06 (×2): 400 mg via ORAL
  Filled 2024-02-05 (×2): qty 1

## 2024-02-06 DIAGNOSIS — E43 Unspecified severe protein-calorie malnutrition: Secondary | ICD-10-CM | POA: Insufficient documentation

## 2024-02-06 DIAGNOSIS — N12 Tubulo-interstitial nephritis, not specified as acute or chronic: Secondary | ICD-10-CM | POA: Diagnosis not present

## 2024-02-06 LAB — GLUCOSE, CAPILLARY
Glucose-Capillary: 335 mg/dL — ABNORMAL HIGH (ref 70–99)
Glucose-Capillary: 177 mg/dL — ABNORMAL HIGH (ref 70–99)
Glucose-Capillary: 223 mg/dL — ABNORMAL HIGH (ref 70–99)
Glucose-Capillary: 205 mg/dL — ABNORMAL HIGH (ref 70–99)

## 2024-02-06 MED ORDER — GABAPENTIN 300 MG PO CAPS
600.0000 mg | ORAL_CAPSULE | Freq: Three times a day (TID) | ORAL | Status: DC
  Administered 2024-02-06 – 2024-02-08 (×6): 600 mg via ORAL
  Filled 2024-02-06 (×6): qty 2

## 2024-02-06 NOTE — Inpatient Diabetes Management (Signed)
 Inpatient Diabetes Program Recommendations  AACE/ADA: New Consensus Statement on Inpatient Glycemic Control  Target Ranges:  Prepandial:   less than 140 mg/dL      Peak postprandial:   less than 180 mg/dL (1-2 hours)      Critically ill patients:  140 - 180 mg/dL    Latest Reference Range & Units 02/05/24 08:02 02/05/24 13:01 02/05/24 17:16 02/05/24 20:18 02/06/24 08:49  Glucose-Capillary 70 - 99 mg/dL 653 (H) 897 (H) 814 (H) 153 (H) 335 (H)    Review of Glycemic Control  Diabetes history: DM2 Outpatient Diabetes medications: Lantus  20 units daily, Novolog  0-18 units QID, Amaryl  2 mg daily, Metformin  1000 mg BID Current orders for Inpatient glycemic control: Lantus  60 units Q24H, Novolog  15 units TID with meals, Novolog  0-20 units TID with meals, Novolog  0-5 units QHS  Inpatient Diabetes Program Recommendations:    Insulin : Fasting CBG 346 mg/dl on 1/82 and 664 mg/dl today.  Spoke to patient over the phone and she reports that CBG yesterday and today was checked after she ate breakfast and that she also eats a snack during the night ([place] sandwich or peanut butter and graham crackers).  Would not recommend any changes with insulin  regimen at this time.   NURSING: Please be sure to check glucose BEFORE meals.   Thanks, Earnie Gainer, RN, MSN, CDCES Diabetes Coordinator Inpatient Diabetes Program [phone] (Team Pager from 8am to 5pm)

## 2024-02-06 NOTE — Progress Notes (Signed)
 Daily Progress Note Intern Pager: [phone]  Patient name: Nancy [name] Medical record number: [id number] Date of birth: May 03, 1966 Age: 58 y.o. Gender: female  Primary Care Provider: Earvin Johnston PARAS, FNP Consultants: none Code Status: FULL  Pt Overview and Major Events to Date:  08/05: Admitted for pyelonephritis 08/11: Completed abx course for pyelonephritis 08/12: Medically stable for discharge pending SNF placement  Assessment and Plan:  Nancy [name] is a 58yo F admitted for pylenophritis. Hospital course has been uneventful. Pyelonephritis has been successfully treated with antibiotics. Patient has been working well with PT/OT however is still recommended for SNF at this time. Pertinent PMH/PSH includes T2DM, peripheral neuropathy, chronic bilateral hip pain s/p severe hip OA on crutches at home. Medically stable for discharge pending SNF placement. 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 A1c 14.3 this admission. Will discuss carb control with patient. Currently on regular diet in order to treat DM by simulating her outpatient diet, however patient has been ordering multiple snacks and cakes throughout the day. - Continue Semglee  60u - Continue 15 units meal time coverage inpatient - CBG check 4x daily with meals, and at bedtime - Increase gabapentin  to 600 mg TID for persistent neuropathic pain of the RLE Chronic health problem Substance Abuse: TOC consulted, cont nicotine  patch 21 mg daily, Nicotine  4 mg gum PRN. UDS 8/15 negative. Insomnia: cont melatonin, hydroxyzine  T2DM Neuropathy: continue Cymbalta  60 mg daily, gabapentin  300mg  TID,  Pressure ulcer: superficial, present on admission. Wound care management as ordered  Constipation: daily Miralax , experienced diarrhea with fiber supplements.  OA of Hip/Low back pain: uses crutches  at home, cont tylenol , ibuprofen , oxy 2.5 mg PRN;  Alcohol use disorder: Stable. Last drink 8/5. CIWA protocol d/c'd. Vitamin B 100 mg daily, Folic acid  1 mg daily. Hydroxyzine  25 mg 3 times daily as needed for anxiety.  FEN/GI: regular diet tentative to change to carb modified PPx: Lovenox  Dispo:SNF pending placement.  Subjective:  Patient was seen and examined at bedside.  She reports improving at persistent neuropathic pain in the right thigh that wakes her up between 12 AM and 2 AM at night.  She states the nighttime increase in gabapentin  along with the Robaxin  has been helpful.  She states she has been working well with PT OT and has been walking the hallways with her walker.  She denies any other complaints at this time.  Objective: Temp:  [97.9 F (36.6 C)-98.1 F (36.7 C)] 98 F (36.7 C) (08/18 0510) Pulse Rate:  [78-98] 98 (08/18 0510) Resp:  [16-20] 20 (08/18 0510) BP: (100-113)/(63-73) 113/71 (08/18 0510) SpO2:  [90 %-95 %] 94 % (08/18 0510)  Physical Exam: General: A&O, NAD Respiratory: normal WOB GI: non-distended  Extremities: no peripheral edema. Neuro: moves all four extremities appropriately Skin: no lesions/rashes visualized Psych: Appropriate mood and affect   Laboratory: Most recent CBC Lab Results  Component Value Date   WBC 10.0 01/25/2024   HGB 13.3 01/25/2024   HCT 38.6 01/25/2024   MCV 91.7 01/25/2024   PLT 240 01/25/2024   Most recent BMP    Latest Ref Rng & Units 01/31/2024    6:16 AM  BMP  Glucose 70 - 99 mg/dL 703   BUN 6 - 20 mg/dL 24   Creatinine 9.55 - 1.00 mg/dL 9.53   Sodium 864 - 854 mmol/L 132   Potassium 3.5 - 5.1 mmol/L  4.5   Chloride 98 - 111 mmol/L 95   CO2 22 - 32 mmol/L 30   Calcium  8.9 - 10.3 mg/dL 9.3    Lupie Credit, DO 02/06/2024, 7:09 AM  PGY-1, [facility] Family Medicine FPTS Intern pager: [phone], text pages welcome Secure chat group [facility] Teaching Service

## 2024-02-06 NOTE — Assessment & Plan Note (Deleted)
 2/2 to SDOH. Improved throughout hospital stay with increased PO intake. - RD following - Continue MVI, folic acid , thiamine , Vit C

## 2024-02-06 NOTE — Assessment & Plan Note (Addendum)
 Diabetic neuropathy improving slightly with interval increase in gabapentin  yet still persistent. A1c 14.3 this admission. Will discuss carb control with patient. Currently on regular diet in order to treat DM by simulating her outpatient diet, however patient has been ordering multiple snacks and cakes throughout the day. - Continue Semglee  60u - Continue 15 units meal time coverage inpatient - CBG check 4x daily with meals, and at bedtime - Increase gabapentin  to 600 mg TID for persistent neuropathic pain of the RLE

## 2024-02-06 NOTE — Progress Notes (Signed)
 Mobility Specialist Progress Note:    02/06/24 1251  Mobility  Activity Ambulated with assistance (In hallway)  Level of Assistance Standby assist, set-up cues, supervision of patient - no hands on  Assistive Device Front wheel walker  Distance Ambulated (ft) 180 ft (*3)  Activity Response Tolerated well  Mobility Referral Yes  Mobility visit 1 Mobility  Mobility Specialist Start Time (ACUTE ONLY) 1214  Mobility Specialist Stop Time (ACUTE ONLY) 1234  Mobility Specialist Time Calculation (min) (ACUTE ONLY) 20 min   Received pt in bed and agreeable to mobility. Required no assistance. Requested to use the BR prior to ambulating in halls. Pt took x3 standing rest breaks. C/o RLE pain and low back pain, otherwise tolerated well. SPO2 stable during ambulation >89%. Returned pt to room without fault. Left in bed with personal belongings and call light within reach. All needs met. RN present.  Lavanda Pollack Mobility Specialist  Please contact via [employer] or  Rehab Office [phone]

## 2024-02-06 NOTE — Assessment & Plan Note (Signed)
 Substance Abuse: TOC consulted, cont nicotine  patch 21 mg daily, Nicotine  4 mg gum PRN. UDS 8/15 negative. Insomnia: cont melatonin, hydroxyzine  T2DM Neuropathy: continue Cymbalta  60 mg daily, gabapentin  300mg  TID,  Pressure ulcer: superficial, present on admission. Wound care management as ordered  Constipation: daily Miralax , experienced diarrhea with fiber supplements.  OA of Hip/Low back pain: uses crutches at home, cont tylenol , ibuprofen , oxy 2.5 mg PRN;  Alcohol use disorder: Stable. Last drink 8/5. CIWA protocol d/c'd. Vitamin B 100 mg daily, Folic acid  1 mg daily. Hydroxyzine  25 mg 3 times daily as needed for anxiety.

## 2024-02-07 DIAGNOSIS — N12 Tubulo-interstitial nephritis, not specified as acute or chronic: Secondary | ICD-10-CM | POA: Diagnosis not present

## 2024-02-07 LAB — GLUCOSE, CAPILLARY
Glucose-Capillary: 333 mg/dL — ABNORMAL HIGH (ref 70–99)
Glucose-Capillary: 348 mg/dL — ABNORMAL HIGH (ref 70–99)
Glucose-Capillary: 104 mg/dL — ABNORMAL HIGH (ref 70–99)
Glucose-Capillary: 267 mg/dL — ABNORMAL HIGH (ref 70–99)
Glucose-Capillary: 127 mg/dL — ABNORMAL HIGH (ref 70–99)

## 2024-02-07 NOTE — Progress Notes (Signed)
 Daily Progress Note Intern Pager: [phone]  Patient name: Nancy [name] Medical record number: [id number] Date of birth: 30-Oct-1965 Age: 58 y.o. Gender: female  Primary Care Provider: Earvin Johnston PARAS, FNP Consultants: None Code Status: Full  Pt Overview and Major Events to Date:  08/05: Admitted for pyelonephritis 08/11: Completed abx course for pyelonephritis 08/12: Medically stable for discharge pending SNF placement 08/19: Awaiting insurance auth for [place] SNF  Assessment and Plan:  Nancy [name] is a 58yo F admitted for pylenophritis. Hospital course has been uneventful. Pyelonephritis has been successfully treated with antibiotics. Patient has been working well with PT/OT however is still recommended for SNF at this time. Pertinent PMH/PSH includes T2DM, peripheral neuropathy, chronic bilateral hip pain s/p severe hip OA on crutches at home. Medically stable for discharge pending SNF placement. 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 A1c 14.3 this admission. Will discuss carb control with patient. Currently on regular diet in order to treat DM by simulating her outpatient diet, however patient has been ordering multiple snacks and cakes throughout the day. - Continue Semglee  60u - Continue 15 units meal time coverage inpatient - CBG check 4x daily with meals, and at bedtime - Continue gabapentin  to 600 mg TID for persistent neuropathic pain of the RLE Chronic health problem Substance Abuse: TOC consulted, cont nicotine  patch 21 mg daily, Nicotine  4 mg gum PRN. UDS 8/15 negative. Insomnia: cont melatonin, hydroxyzine  T2DM Neuropathy: continue Cymbalta  60 mg daily, gabapentin  300mg  TID,  Pressure ulcer: superficial, present on admission. Wound care management as ordered  Constipation: daily Miralax , experienced diarrhea with fiber  supplements.  OA of Hip/Low back pain: uses crutches at home, cont tylenol , ibuprofen , oxy 2.5 mg PRN;  Alcohol use disorder: Stable. Last drink 8/5. CIWA protocol d/c'd. Vitamin B 100 mg daily, Folic acid  1 mg daily. Hydroxyzine  25 mg 3 times daily as needed for anxiety.  FEN/GI: Regular diet PPx: Lovenox  Dispo:SNF pending insurance authorization.  Subjective:  Patient was seen and examined at bedside.  She states the increase in gabapentin  has helped.  Morning BG 333.  Discussed with patient about carb control and the side effects especially leading to her neuropathic pain.  She verbalizes understanding and will watch her sugar intake.  Per SW, possible dispo to [place] SNF. She denies any vaginal discharge, irritation, or foul odor today and has declined offer for vaginal swab and testing.  Objective: Temp:  [97.8 F (36.6 C)-98.5 F (36.9 C)] 97.8 F (36.6 C) (08/19 0351) Pulse Rate:  [77-93] 77 (08/19 0351) Resp:  [17-18] 17 (08/18 1902) BP: (92-116)/(61-82) 92/61 (08/19 0351) SpO2:  [91 %-95 %] 91 % (08/19 0351) Weight:  [63.8 kg] 63.8 kg (08/18 0700)  Physical Exam: General: A&O, NAD, lying comfortably supine on bed Cardiac: RRR, no m/r/g Respiratory: CTAB, normal WOB, no w/c/r GI: Soft, NTTP, non-distended, BS present Extremities: NTTP, no peripheral edema. Neuro: Normal gait with walker, moves all four extremities appropriately. Psych: Appropriate mood and affect   Laboratory: Most recent CBC Lab Results  Component Value Date   WBC 10.0 01/25/2024   HGB 13.3 01/25/2024   HCT 38.6 01/25/2024   MCV 91.7 01/25/2024   PLT 240 01/25/2024   Most recent BMP    Latest Ref Rng & Units 01/31/2024    6:16 AM  BMP  Glucose 70 - 99 mg/dL 703   BUN 6 - 20  mg/dL 24   Creatinine 9.55 - 1.00 mg/dL 9.53   Sodium 864 - 854 mmol/L 132   Potassium 3.5 - 5.1 mmol/L 4.5   Chloride 98 - 111 mmol/L 95   CO2 22 - 32 mmol/L 30   Calcium  8.9 - 10.3 mg/dL 9.3    Nancy Credit,  DO 02/07/2024, 6:59 AM  PGY-1, [facility] Family Medicine FPTS Intern pager: [phone], text pages welcome Secure chat group [facility] Teaching Service

## 2024-02-07 NOTE — Assessment & Plan Note (Addendum)
 Diabetic neuropathy improving slightly with interval increase in gabapentin  yet still persistent. A1c 14.3 this admission. Will discuss carb control with patient. Currently on regular diet in order to treat DM by simulating her outpatient diet, however patient has been ordering multiple snacks and cakes throughout the day. - Continue Semglee  60u - Continue 15 units meal time coverage inpatient - CBG check 4x daily with meals, and at bedtime - Continue gabapentin  to 600 mg TID for persistent neuropathic pain of the RLE

## 2024-02-07 NOTE — Inpatient Diabetes Management (Signed)
 Inpatient Diabetes Program Recommendations  AACE/ADA: New Consensus Statement on Inpatient Glycemic Control   Target Ranges:  Prepandial:   less than 140 mg/dL      Peak postprandial:   less than 180 mg/dL (1-2 hours)      Critically ill patients:  140 - 180 mg/dL    Latest Reference Range & Units 02/07/24 07:42 02/07/24 07:59  Glucose-Capillary 70 - 99 mg/dL 651 (H) 666 (H)    Latest Reference Range & Units 02/06/24 08:49 02/06/24 12:08 02/06/24 17:52 02/06/24 19:59  Glucose-Capillary 70 - 99 mg/dL 664 (H) 822 (H) 776 (H) 205 (H)   Review of Glycemic Control  Diabetes history: DM2 Outpatient Diabetes medications: Lantus  20 units daily, Novolog  0-18 units QID, Amaryl  2 mg daily, Metformin  1000 mg BID Current orders for Inpatient glycemic control: Lantus  60 units Q24H, Novolog  15 units TID with meals, Novolog  0-20 units TID with meals, Novolog  0-5 units QHS  Inpatient Diabetes Program Recommendations:    Insulin : Please consider increasing Lantus  to 65 units Q24H and meal coverage to Novolog  18 units TID with meals.  Thanks, Earnie Gainer, RN, MSN, CDCES Diabetes Coordinator Inpatient Diabetes Program [phone] (Team Pager from 8am to 5pm)

## 2024-02-07 NOTE — Plan of Care (Signed)

## 2024-02-07 NOTE — Progress Notes (Signed)
 Physical Therapy Treatment Patient Details Name: Nancy [name] MRN: [id number] DOB: Jun 20, 1966 Today's Date: 02/07/2024   History of Present Illness 58 y.o. female presents to [facility] 01/24/24 with multiple falls and hypotension. On CIWA protocol with sepsis 2/2 pyelonephritis. PMH of T2DM, GERD, alcohol use disorder    PT Comments  Pt resting in bed on arrival, pleasant and agreeable to session. Pt with continued progress towards acute goals this session, demonstrating increased activity tolerance, progressing gait distance with RW for support and grossly CGA for safety with no overt LOB noted this session. Continued education on self-pacing, safety with mobility, RW use and importance of frequent mobilization to progress functional mobility. Pt continues to benefit from skilled PT services to progress toward functional mobility goals.     If plan is discharge home, recommend the following: A little help with walking and/or transfers;A little help with bathing/dressing/bathroom;Assist for transportation;Help with stairs or ramp for entrance   Can travel by private vehicle     Yes  Equipment Recommendations  None recommended by PT    Recommendations for Other Services       Precautions / Restrictions Precautions Precautions: Fall;Other (comment) Recall of Precautions/Restrictions: Intact Precaution/Restrictions Comments: watch BP Restrictions Weight Bearing Restrictions Per Provider Order: No     Mobility  Bed Mobility Overal bed mobility: Needs Assistance Bed Mobility: Supine to Sit, Sit to Supine     Supine to sit: Supervision Sit to supine: Supervision   General bed mobility comments: with increased time    Transfers Overall transfer level: Needs assistance Equipment used: Rolling walker (2 wheels), None Transfers: Sit to/from Stand Sit to Stand: Contact guard assist           General transfer comment: CGA for safety, good recall for hand placement, increased time to  come to standing with bil knees remaining flexed    Ambulation/Gait Ambulation/Gait assistance: Contact guard assist, Min assist Gait Distance (Feet): 200 Feet Assistive device: Rolling walker (2 wheels) Gait Pattern/deviations: Step-to pattern, Decreased stride length, Decreased dorsiflexion - right, Decreased dorsiflexion - left, Knee flexed in stance - right, Knee flexed in stance - left, Antalgic Gait velocity: decr     General Gait Details: pt ambulating with flexed posture with bil knees flexed in stance with low foot clearance, pt with heavy reliance on UE support to offload painful LEs, pt needing x2 standing rest breaks due to fatigue. cues to drop shoulders from ears as pt shoulders rising with UE use on RW   Stairs             Wheelchair Mobility     Tilt Bed    Modified Rankin (Stroke Patients Only)       Balance Overall balance assessment: Mild deficits observed, not formally tested, History of Falls Sitting-balance support: No upper extremity supported, Feet supported Sitting balance-Leahy Scale: Good     Standing balance support: Bilateral upper extremity supported, During functional activity Standing balance-Leahy Scale: Poor Standing balance comment: reliant on RW and external support                            Communication Communication Communication: No apparent difficulties  Cognition Arousal: Alert Behavior During Therapy: WFL for tasks assessed/performed, Impulsive                             Following commands: Intact      Cueing Cueing Techniques:  Verbal cues  Exercises      General Comments        Pertinent Vitals/Pain Pain Assessment Pain Assessment: Faces Faces Pain Scale: Hurts little more Pain Location: bil hips Pain Descriptors / Indicators: Discomfort, Grimacing, Constant, Burning Pain Intervention(s): Limited activity within patient's tolerance, Monitored during session    Home Living                           Prior Function            PT Goals (current goals can now be found in the care plan section) Acute Rehab PT Goals Patient Stated Goal: to not have any more falls PT Goal Formulation: With patient Time For Goal Achievement: 02/08/24 Progress towards PT goals: Progressing toward goals    Frequency    Min 2X/week      PT Plan      Co-evaluation              AM-PAC PT 6 Clicks Mobility   Outcome Measure  Help needed turning from your back to your side while in a flat bed without using bedrails?: A Little Help needed moving from lying on your back to sitting on the side of a flat bed without using bedrails?: A Little Help needed moving to and from a bed to a chair (including a wheelchair)?: A Little Help needed standing up from a chair using your arms (e.g., wheelchair or bedside chair)?: A Little Help needed to walk in hospital room?: A Little Help needed climbing 3-5 steps with a railing? : A Lot 6 Click Score: 17    End of Session   Activity Tolerance: Patient tolerated treatment well;Patient limited by fatigue;Patient limited by pain Patient left: Other (comment) (in bathroom) Nurse Communication: Mobility status PT Visit Diagnosis: Other abnormalities of gait and mobility (R26.89);Muscle weakness (generalized) (M62.81);History of falling (Z91.81)     Time: 8456-8442 PT Time Calculation (min) (ACUTE ONLY): 14 min  Charges:    $Gait Training: 8-22 mins PT General Charges $$ ACUTE PT VISIT: 1 Visit                     Lissy Deuser R. PTA Acute Rehabilitation Services Office: [phone]   [name] 02/07/2024, 4:12 PM

## 2024-02-07 NOTE — TOC Progression Note (Signed)
 Transition of Care [facility]) - Progression Note    Patient Details  Name: KWEEN BACORN MRN: [id number] Date of Birth: 10/18/1965  Transition of Care [facility]) CM/SW Contact  Hasson Gaspard LITTIE Mo[place], [place] Phone Number: 02/07/2024, 10:54 AM  Clinical Narrative:    CSW spoke with Isaiah with [facility] and informed her that UDS came back negative. Isaiah stated she would start insurance auth for pt.   Expected Discharge Plan: Skilled Nursing Facility Barriers to Discharge: Continued Medical Work up, English as a second language teacher, SNF Pending bed offer               Expected Discharge Plan and Services       Living arrangements for the past 2 months: Single Family Home                                       Social Drivers of Health (SDOH) Interventions SDOH Screenings   Food Insecurity: Food Insecurity Present (01/24/2024)  Housing: High Risk (01/24/2024)  Transportation Needs: Unmet Transportation Needs (01/24/2024)  Utilities: At Risk (01/24/2024)  Financial Resource Strain: Not on File (10/08/2021)   Received from [facility]  Physical Activity: Not on File (10/08/2021)   Received from [facility]  Social Connections: Socially Isolated (10/26/2023)  Stress: Not on File (10/08/2021)   Received from [facility]  Tobacco Use: High Risk (01/24/2024)    Readmission Risk Interventions     No data to display

## 2024-02-07 NOTE — Plan of Care (Signed)

## 2024-02-07 NOTE — Assessment & Plan Note (Signed)
 Substance Abuse: TOC consulted, cont nicotine  patch 21 mg daily, Nicotine  4 mg gum PRN. UDS 8/15 negative. Insomnia: cont melatonin, hydroxyzine  T2DM Neuropathy: continue Cymbalta  60 mg daily, gabapentin  300mg  TID,  Pressure ulcer: superficial, present on admission. Wound care management as ordered  Constipation: daily Miralax , experienced diarrhea with fiber supplements.  OA of Hip/Low back pain: uses crutches at home, cont tylenol , ibuprofen , oxy 2.5 mg PRN;  Alcohol use disorder: Stable. Last drink 8/5. CIWA protocol d/c'd. Vitamin B 100 mg daily, Folic acid  1 mg daily. Hydroxyzine  25 mg 3 times daily as needed for anxiety.

## 2024-02-08 DIAGNOSIS — N12 Tubulo-interstitial nephritis, not specified as acute or chronic: Secondary | ICD-10-CM | POA: Diagnosis not present

## 2024-02-08 LAB — GLUCOSE, CAPILLARY
Glucose-Capillary: 273 mg/dL — ABNORMAL HIGH (ref 70–99)
Glucose-Capillary: 55 mg/dL — ABNORMAL LOW (ref 70–99)
Glucose-Capillary: 80 mg/dL (ref 70–99)
Glucose-Capillary: 414 mg/dL — ABNORMAL HIGH (ref 70–99)
Glucose-Capillary: 252 mg/dL — ABNORMAL HIGH (ref 70–99)

## 2024-02-08 MED ORDER — GABAPENTIN 400 MG PO CAPS
700.0000 mg | ORAL_CAPSULE | Freq: Three times a day (TID) | ORAL | Status: DC
  Administered 2024-02-08 – 2024-02-16 (×25): 700 mg via ORAL
  Filled 2024-02-08 (×25): qty 1

## 2024-02-08 NOTE — Assessment & Plan Note (Addendum)
 Diabetic neuropathy improving slightly with interval increase in gabapentin  yet still persistent. A1c 14.3 this admission.  Blood sugars have remained below 300s since discussion of carb controlled diet. - Continue Semglee  60u - Continue 15 units meal time coverage inpatient - CBG check 4x daily with meals, and at bedtime - Continue gabapentin  to 600 mg TID for persistent neuropathic pain of the RLE

## 2024-02-08 NOTE — Progress Notes (Signed)
 Mobility Specialist Progress Note:    02/08/24 1413  Mobility  Activity Ambulated with assistance (In hallway)  Level of Assistance Standby assist, set-up cues, supervision of patient - no hands on  Assistive Device Front wheel walker  Distance Ambulated (ft) 75 ft  Activity Response Tolerated well  Mobility Referral Yes  Mobility visit 1 Mobility  Mobility Specialist Start Time (ACUTE ONLY) 1311  Mobility Specialist Stop Time (ACUTE ONLY) 1325  Mobility Specialist Time Calculation (min) (ACUTE ONLY) 14 min   Received pt in chair and agreeable to mobility. No physical assistance needed. Pt requested a seated rest break d/t fatigue and RLE pain, further mobility deferred.  Pt ambulated back to room. Left pt in chair with personal belongings and call light within reach. All needs met.  Lavanda Pollack Mobility Specialist  Please contact via [employer] or  Rehab Office [phone]

## 2024-02-08 NOTE — Inpatient Diabetes Management (Signed)
 Inpatient Diabetes Program Recommendations  AACE/ADA: New Consensus Statement on Inpatient Glycemic Control   Target Ranges:  Prepandial:   less than 140 mg/dL      Peak postprandial:   less than 180 mg/dL (1-2 hours)      Critically ill patients:  140 - 180 mg/dL    Latest Reference Range & Units 02/07/24 07:59 02/07/24 12:02 02/07/24 17:02 02/07/24 21:33 02/08/24 08:19  Glucose-Capillary 70 - 99 mg/dL 666 (H)  Novolog  30 units 104 (H) 267 (H)  Novolog  26 units  Lantus  60 units 127 (H) 273 (H)  Novolog  26 units    Latest Reference Range & Units 02/06/24 08:49 02/06/24 12:08 02/06/24 17:52 02/06/24 19:59  Glucose-Capillary 70 - 99 mg/dL 664 (H)  Novolog  30 units 177 (H)  Novolog  19 units 223 (H)  Novolog  22 units  Lantus  60 units 205 (H)  Novolog  2 units   Review of Glycemic Control  Diabetes history: DM2 Outpatient Diabetes medications: Lantus  20 units daily, Novolog  0-18 units QID, Amaryl  2 mg daily, Metformin  1000 mg BID Current orders for Inpatient glycemic control: Lantus  60 units Q24H, Novolog  15 units TID with meals, Novolog  0-20 units TID with meals, Novolog  0-5 units QHS   Inpatient Diabetes Program Recommendations:     Insulin : Please consider increasing Lantus  to 65 units Q24H and meal coverage to Novolog  17 units TID with meals.   Thanks, Earnie Gainer, RN, MSN, CDCES Diabetes Coordinator Inpatient Diabetes Program [phone] (Team Pager from 8am to 5pm)

## 2024-02-08 NOTE — Progress Notes (Signed)
 Occupational Therapy Treatment Patient Details Name: Nancy [name] MRN: [id number] DOB: Dec 27, 1965 Today's Date: 02/08/2024   History of present illness 58 y.o. female presents to [facility] 01/24/24 with multiple falls and hypotension. On CIWA protocol with sepsis 2/2 pyelonephritis. PMH of T2DM, GERD, alcohol use disorder   OT comments  OT session focused on training in techniques for increased safety and independence with functional tasks. Pt currently demonstrating ability to largely complete ADLs Independent to Min assist with use of AE for LB ADLs, bed mobility with Supervision, and functional mobility/transfers with a RW with close Supervision for safety. OT also reviewed with pt the written B UE HEP that was provided last session. Pt demonstrates good carryover of prior training in use of HEP and demonstrates ability to complete HEP with Supervision and occasional cues for technique. Pt participated well in session but was limited by fatigue. Pt is making progress toward OT goals. OT goals updated this day based on pt progress and current functional level. Pt will benefit from continued acute skilled OT to address deficits and increase safety and independence with functional tasks. Post acute discharge, pt will benefit from intensive inpatient skilled rehab services < 3 hours per day to maximize rehab potential.       If plan is discharge home, recommend the following:  A little help with walking and/or transfers;A little help with bathing/dressing/bathroom;Assistance with cooking/housework;Assist for transportation;Help with stairs or ramp for entrance   Equipment Recommendations  Other (comment) (defer to next level of care)    Recommendations for Other Services      Precautions / Restrictions Precautions Precautions: Fall;Other (comment) Recall of Precautions/Restrictions: Intact Precaution/Restrictions Comments: watch BP Restrictions Weight Bearing Restrictions Per Provider Order: No        Mobility Bed Mobility Overal bed mobility: Needs Assistance Bed Mobility: Supine to Sit, Sit to Supine     Supine to sit: Supervision Sit to supine: Supervision   General bed mobility comments: with increased time    Transfers Overall transfer level: Needs assistance Equipment used: Rolling walker (2 wheels) Transfers: Sit to/from Stand, Bed to chair/wheelchair/BSC Sit to Stand: Supervision     Step pivot transfers: Supervision     General transfer comment: close Supervision for safety, good recall for hand placement, increased time to come to standing with bil knees remaining flexed     Balance Overall balance assessment: Mild deficits observed, not formally tested, History of Falls Sitting-balance support: No upper extremity supported, Feet supported Sitting balance-Leahy Scale: Good     Standing balance support: Single extremity supported, Bilateral upper extremity supported, During functional activity, Reliant on assistive device for balance Standing balance-Leahy Scale: Poor Standing balance comment: reliant on RW for support                           ADL either performed or assessed with clinical judgement   ADL Overall ADL's : Needs assistance/impaired Eating/Feeding: Independent;Sitting   Grooming: Supervision/safety;Standing   Upper Body Bathing: Set up;Supervision/ safety;Sitting   Lower Body Bathing: Contact guard assist;With adaptive equipment;Sitting/lateral leans;Sit to/from stand;Cueing for compensatory techniques Lower Body Bathing Details (indicate cue type and reason): simulated at EOB Upper Body Dressing : Set up;Sitting Upper Body Dressing Details (indicate cue type and reason): simulated at EOB Lower Body Dressing: Contact guard assist;Minimal assistance;With adaptive equipment;Cueing for compensatory techniques;Sitting/lateral leans;Sit to/from stand   Toilet Transfer: Supervision/safety;Regular Toilet;Rolling walker (2  wheels);Cueing for safety;Grab bars   Toileting- Clothing Manipulation and  Hygiene: Supervision/safety;Sitting/lateral lean;Sit to/from stand       Functional mobility during ADLs: Supervision/safety;Rolling walker (2 wheels);Cueing for safety General ADL Comments: Pt continues to present with decreased activity tolerance    Extremity/Trunk Assessment Upper Extremity Assessment Upper Extremity Assessment: Right hand dominant;RUE deficits/detail;LUE deficits/detail RUE Deficits / Details: Gross strength 4- to 4/5 LUE Deficits / Details: Gross strength 4-/5; decreased fine motor coordination   Lower Extremity Assessment Lower Extremity Assessment: Defer to PT evaluation        Vision   Vision Assessment?: No apparent visual deficits   Perception     Praxis     Communication Communication Communication: No apparent difficulties   Cognition Arousal: Alert Behavior During Therapy: WFL for tasks assessed/performed, Impulsive Cognition: Cognition impaired, No family/caregiver present to determine baseline     Awareness: Intellectual awareness intact, Online awareness intact   Attention impairment (select first level of impairment): Divided attention Executive functioning impairment (select all impairments): Problem solving OT - Cognition Comments: AAOx4 and pleasant throughout session. Cogntion largely [facility] for tasks assessed. However, pt with noted deficits as decribed above. Suspect pt is at or near baseline cognition.                 Following commands: Intact        Cueing   Cueing Techniques: Verbal cues  Exercises Exercises: Other exercises Other Exercises Other Exercises: OT reviewed use of written handout of AROM HEP that was provided last session with pt demonstrating understanding through teach back ([employer] access code: [email or web address]) and ability to complete HEP with Supervision and occasional cues for technique. Pt declining completing full HEP this session  due to fatigue.    Shoulder Instructions       General Comments VSS on RA    Pertinent Vitals/ Pain       Pain Assessment Pain Assessment: 0-10 Pain Score: 8  Pain Location: whole Right side with funcitonal mobility Pain Descriptors / Indicators: Discomfort, Constant, Grimacing, Burning Pain Intervention(s): Limited activity within patient's tolerance, Monitored during session, Repositioned  Home Living                                          Prior Functioning/Environment              Frequency  Min 2X/week        Progress Toward Goals  OT Goals(current goals can now be found in the care plan section)  Progress towards OT goals: Progressing toward goals;Goals updated  Acute Rehab OT Goals Patient Stated Goal: to feel better OT Goal Formulation: With patient Time For Goal Achievement: 02/15/24 Potential to Achieve Goals: Good ADL Goals Pt Will Perform Grooming: with modified independence;standing Pt Will Perform Lower Body Bathing: with supervision;sitting/lateral leans;sit to/from stand;with adaptive equipment Pt Will Perform Lower Body Dressing: with supervision;with adaptive equipment;sitting/lateral leans;sit to/from stand Pt Will Transfer to Toilet: with modified independence;ambulating;regular height toilet (with least restrictive AD) Pt Will Perform Toileting - Clothing Manipulation and hygiene: with modified independence;sit to/from stand;sitting/lateral leans Pt/caregiver will Perform Home Exercise Program: Increased strength;Both right and left upper extremity;With theraband;With theraputty;Independently;With written HEP provided (Increased Left fine motor coordiantion; Increased activity tolerance)  Plan      Co-evaluation                 AM-PAC OT 6 Clicks Daily Activity     Outcome Measure  Help from another person eating meals?: None Help from another person taking care of personal grooming?: A Little Help from  another person toileting, which includes using toliet, bedpan, or urinal?: A Little Help from another person bathing (including washing, rinsing, drying)?: A Little Help from another person to put on and taking off regular upper body clothing?: A Little Help from another person to put on and taking off regular lower body clothing?: A Little 6 Click Score: 19    End of Session Equipment Utilized During Treatment: Rolling walker (2 wheels);Other (comment);Gait belt (AE for LB ADLs)  OT Visit Diagnosis: Repeated falls (R29.6);History of falling (Z91.81);Muscle weakness (generalized) (M62.81);Ataxia, unspecified (R27.0)   Activity Tolerance Patient tolerated treatment well;Patient limited by fatigue   Patient Left in bed;with call bell/phone within reach;with bed alarm set   Nurse Communication Mobility status        Time: 8377-8362 OT Time Calculation (min): 15 min  Charges: OT General Charges $OT Visit: 1 Visit OT Treatments $Self Care/Home Management : 8-22 mins  Margarie Rockey HERO., OTR/L, MA Acute Rehab [phone]   Margarie FORBES Horns 02/08/2024, 5:23 PM

## 2024-02-08 NOTE — Discharge Summary (Incomplete)
 Family Medicine Teaching [facility] Discharge Summary  Patient name: Nancy [name] Medical record number: [id number] Date of birth: 12-Jan-1966 Age: 58 y.o. Gender: female Date of Admission: 01/24/2024  Date of Discharge: *** Admitting Physician: Alfornia Light, DO  Primary Care Provider: Earvin Johnston PARAS, FNP Consultants: ***  Indication for Hospitalization: ***  Brief Hospital Course:  Nancy [name] is a 58 y.o.female with a history of T2DM, admitted to the [facility] Medicine Teaching Service at [facility] for pyelonephritis. Her hospital course is detailed below:  Pyelonephritis: UA with rare bacteria and WBC clumps, pt endorsing dysuria and vomiting. CT A/P showed suspicion for pyelonephritis in left kidney. Blood cultures showed no growth, urine culture unable to be obtained.  Patient was started on IV Rocephin  for 3 days and transitioned to p.o. Duricef for 5 days.  Fall: Patient states she tripped and recliner fell on top of her. She denies any loss of consciousness, suspect this fall is not pathologic, patient has been endorsing diarrhea for the last 3 weeks and poor p.o. intake which could have contributes to generalized weakness. Pt endorses hitting her head. CT abdomen and pelvis, CT head, CT C-spine negative for acute abnormality. PT/OT have been consulted to evaluate patient. PT is recommending SNF placement.  Type 2 diabetes mellitus with hyperglycemia: CBG in the ED on arrival was 579 the patient reported that he had not had insulin  for 2 days.  Patient was started on sensitive sliding scale insulin  and 10 units of long-acting insulin  (half her home dose) and increased to moderate sliding scale insulin  and 16 units of long-acting insulin  to continued elevated CBGs. Her CBGs remain elevated so we increased her long-acting insulin  to 25 units and transitioned her from moderate sliding scale to resistant sliding scale insulin . A1c on admission is 14.3  Hypotension: Suspect  hypotension on arrival due to hypovolemia 2/2 to 3 weeks of diarrhea, and some vomiting.  Continues IV fluids, and ordered orthostatic vital signs which were never performed.  Patient's blood pressure has remained normotensive throughout her hospitalization following IV fluids.  Diarrhea: Patient has been complaining of non-bloody diarrhea for the past 3 weeks, however presenting with unclear etiology.  We will continue to give her IV fluids and monitor bowel movements while she is inpatient.  She is reporting improvement in her diarrhea and she became constipated.  She was then started on MiraLAX  daily for constipation. Resolved before d/c.  Alcohol use disorder: Patient reports drinking 40 ounces of beer daily, her last drink was the morning of admission.  She denies history of DTs and seizures.  Patient is continuously monitored for withdrawal symptoms and placed on CIWA protocol. She was given vitamin B and folic acid  daily.  Her CIWA scores has remained low throughout her hospitalization.  Patient had been complaining of anxiety has ongoing withdrawal symptoms, and was started on hydroxyzine  3 times daily as needed.  Osteoarthritis: X-ray of pelvis showed severe degenerative changes, possible avascular necrosis.  Given Tylenol  650 mg every 6 hours as needed.  Still complaining of pain and so we increased her Tylenol  to 1000 mg every 6 hours as needed, and added 400 mg every 6 hours as needed and low-dose oxycodone  2.5 mg every 6 as needed.  Still complains of bilateral hip pain with ambulation and at rest.  We discussed with patient that PT and strength training would be best tools for pain control.  Cough Patient had developed a productive cough towards the end of her hospital  stay. She was encouraged to use incentive spirometry. This resolved before d/c.  Other chronic conditions were medically managed with home medications and formulary alternatives as necessary (insomnia, diabetic neuropathy,  pressure ulcer, constipation, chronic hip pain).  PCP Follow-up Recommendations: Readdress insulin  requirements - uncontrolled Address food insecurity PT for severe OA Follow-up pressure ulcer Consider ABIs Substance use counseling - alcohol and cocaine  Discharge Diagnoses/Problem List:  @FAMMDPOC @   ***  Disposition: ***  Discharge Condition: ***  Discharge Exam: ***  Issues for Follow Up:  1. ***  Significant Procedures: ***  Significant Labs and Imaging:  No results for input(s): WBC, HGB, HCT, PLT in the last 48 hours. No results for input(s): NA, K, CL, CO2, GLUCOSE, BUN, CREATININE, CALCIUM , MG, PHOS, ALKPHOS, AST, ALT, ALBUMIN, PROTEIN in the last 48 hours.  ***  Results/Tests Pending at Time of Discharge: ***  Discharge Medications:  Allergies as of 02/08/2024       Reactions   Glucophage  [metformin ] Diarrhea     Med Rec must be completed prior to using this [facility]***       Discharge Instructions: Please refer to Patient Instructions section of EMR for full details.  Patient was counseled important signs and symptoms that should prompt return to medical care, changes in medications, dietary instructions, activity restrictions, and follow up appointments.   Follow-Up Appointments:   Lupie Credit, DO 02/08/2024, 7:14 AM PGY-***, [facility]

## 2024-02-08 NOTE — Progress Notes (Signed)
     Daily Progress Note Intern Pager: [phone]  Patient name: Nancy [name] Medical record number: [id number] Date of birth: 07/05/1965 Age: 58 y.o. Gender: female  Primary Care Provider: Earvin Johnston PARAS, FNP Consultants: None Code Status: Full  Pt Overview and Major Events to Date:  08/05: Admitted for pyelonephritis 08/11: Completed abx course for pyelonephritis 08/12: Medically stable for discharge pending SNF placement 08/19: Awaiting insurance auth for [facility]  Assessment and Plan:  Nancy [name] is a 58yo F admitted for pylenophritis. Hospital course has been uneventful. Pyelonephritis has been successfully treated with antibiotics. Patient has been working well with PT/OT however is still recommended for SNF at this time. Pertinent PMH/PSH includes T2DM, peripheral neuropathy, chronic bilateral hip pain s/p severe hip OA on crutches at home. Medically stable for discharge, awaiting insurance authorization for [facility]. 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 A1c 57.3 this admission.  Blood sugars have remained below 300s since discussion of carb controlled diet. - Continue Semglee  60u - Continue 15 units meal time coverage inpatient - CBG check 4x daily with meals, and at bedtime - Continue gabapentin  to 600 mg TID for persistent neuropathic pain of the RLE Chronic health problem Substance Abuse: TOC consulted, cont nicotine  patch 21 mg daily, Nicotine  4 mg gum PRN. UDS 8/15 negative. Insomnia: cont melatonin, hydroxyzine  T2DM Neuropathy: continue Cymbalta  60 mg daily, gabapentin  300mg  TID,  Pressure ulcer: superficial, present on admission. Wound care management as ordered  Constipation: daily Miralax , experienced diarrhea with fiber supplements.  OA of Hip/Low back pain: uses crutches at home, cont tylenol , ibuprofen , oxy  2.5 mg PRN;  Alcohol use disorder: Stable. Last drink 8/5. CIWA protocol d/c'd. Vitamin B 100 mg daily, Folic acid  1 mg daily. Hydroxyzine  25 mg 3 times daily as needed for anxiety.  FEN/GI: Regular diet PPx: Lovenox  Dispo:SNF pending insurance authorization.  Subjective:  Patient seen and examined at bedside.  He has no new complaints, pain control is adequate at this time.  Objective: Temp:  [97.7 F (36.5 C)-98.5 F (36.9 C)] 97.9 F (36.6 C) (08/20 0410) Pulse Rate:  [75-84] 75 (08/20 0410) Resp:  [16-18] 18 (08/19 2132) BP: (100-109)/(66-80) 109/80 (08/20 0410) SpO2:  [90 %-94 %] 90 % (08/20 0410)  Physical Exam: General: A&O, NAD HEENT: EOM grossly intact Respiratory: normal WOB GI: non-distended  Extremities: no peripheral edema. Neuro: moves all four extremities appropriately Skin: no lesions/rashes visualized Psych: Appropriate mood and affect   Laboratory: Most recent CBC Lab Results  Component Value Date   WBC 10.0 01/25/2024   HGB 13.3 01/25/2024   HCT 38.6 01/25/2024   MCV 91.7 01/25/2024   PLT 240 01/25/2024   Most recent BMP    Latest Ref Rng & Units 01/31/2024    6:16 AM  BMP  Glucose 70 - 99 mg/dL 703   BUN 6 - 20 mg/dL 24   Creatinine 9.55 - 1.00 mg/dL 9.53   Sodium 864 - 854 mmol/L 132   Potassium 3.5 - 5.1 mmol/L 4.5   Chloride 98 - 111 mmol/L 95   CO2 22 - 32 mmol/L 30   Calcium  8.9 - 10.3 mg/dL 9.3    Lupie Credit, DO 02/08/2024, 7:13 AM  PGY-1, [facility] Family Medicine FPTS Intern pager: [phone], text pages welcome Secure chat group [facility] Teaching Service

## 2024-02-08 NOTE — Assessment & Plan Note (Signed)
 Substance Abuse: TOC consulted, cont nicotine  patch 21 mg daily, Nicotine  4 mg gum PRN. UDS 8/15 negative. Insomnia: cont melatonin, hydroxyzine  T2DM Neuropathy: continue Cymbalta  60 mg daily, gabapentin  300mg  TID,  Pressure ulcer: superficial, present on admission. Wound care management as ordered  Constipation: daily Miralax , experienced diarrhea with fiber supplements.  OA of Hip/Low back pain: uses crutches at home, cont tylenol , ibuprofen , oxy 2.5 mg PRN;  Alcohol use disorder: Stable. Last drink 8/5. CIWA protocol d/c'd. Vitamin B 100 mg daily, Folic acid  1 mg daily. Hydroxyzine  25 mg 3 times daily as needed for anxiety.

## 2024-02-09 DIAGNOSIS — Z794 Long term (current) use of insulin: Secondary | ICD-10-CM

## 2024-02-09 DIAGNOSIS — E1165 Type 2 diabetes mellitus with hyperglycemia: Secondary | ICD-10-CM

## 2024-02-09 LAB — GLUCOSE, CAPILLARY
Glucose-Capillary: 142 mg/dL — ABNORMAL HIGH (ref 70–99)
Glucose-Capillary: 68 mg/dL — ABNORMAL LOW (ref 70–99)
Glucose-Capillary: 101 mg/dL — ABNORMAL HIGH (ref 70–99)
Glucose-Capillary: 344 mg/dL — ABNORMAL HIGH (ref 70–99)
Glucose-Capillary: 166 mg/dL — ABNORMAL HIGH (ref 70–99)

## 2024-02-09 MED ORDER — INSULIN ASPART 100 UNIT/ML IJ SOLN
10.0000 [IU] | Freq: Three times a day (TID) | INTRAMUSCULAR | Status: DC
  Administered 2024-02-09 – 2024-02-10 (×4): 10 [IU] via SUBCUTANEOUS

## 2024-02-09 NOTE — Assessment & Plan Note (Addendum)
 Diabetic neuropathy improving slightly with interval increase in gabapentin  yet still persistent. A1c 14.3 this admission.  Blood sugars dropping to double digits since discussion about carb control, decrease meal time coverage.  - Continue Semglee  60u - With better carb control, decrease 15u to 10u meal time coverage inpatient - CBG check 4x daily with meals, and at bedtime - Increased gabapentin  to 700 mg TID for persistent neuropathic pain of the RLE

## 2024-02-09 NOTE — Assessment & Plan Note (Signed)
 Substance Abuse: TOC consulted, cont nicotine  patch 21 mg daily, Nicotine  4 mg gum PRN. UDS 8/15 negative. Insomnia: cont melatonin, hydroxyzine  T2DM Neuropathy: continue Cymbalta  60 mg daily, gabapentin  300mg  TID,  Pressure ulcer: superficial, present on admission. Wound care management as ordered  Constipation: daily Miralax , experienced diarrhea with fiber supplements.  OA of Hip/Low back pain: uses crutches at home, cont tylenol , ibuprofen , oxy 2.5 mg PRN;  Alcohol use disorder: Stable. Last drink 8/5. CIWA protocol d/c'd. Vitamin B 100 mg daily, Folic acid  1 mg daily. Hydroxyzine  25 mg 3 times daily as needed for anxiety.

## 2024-02-09 NOTE — Progress Notes (Signed)
 Hypoglycemic Event  CBG: 68  Treatment: 8 oz juice/soda  Symptoms: Sweaty  Follow-up CBG: Time:  CBG Result:101  Possible Reasons for Event: Other: High dosage of Insulin ?  Comments/MD notified:MD was notified, awaiting for response/ orders.    Nancy [name] G Jyssica Rief

## 2024-02-09 NOTE — Progress Notes (Signed)
 Mobility Specialist Progress Note:    02/09/24 1231  Mobility  Activity Ambulated with assistance (In hallway)  Level of Assistance Standby assist, set-up cues, supervision of patient - no hands on  Assistive Device Front wheel walker  Distance Ambulated (ft) 125 ft (*2)  Activity Response Tolerated well  Mobility Referral Yes  Mobility visit 1 Mobility  Mobility Specialist Start Time (ACUTE ONLY) 1150  Mobility Specialist Stop Time (ACUTE ONLY) 1213  Mobility Specialist Time Calculation (min) (ACUTE ONLY) 23 min   Received pt in BR and agreeable to mobility. No physical assistance needed. C/o RLE pain, otherwise tolerated well. Returned to room without fault. Pt left in chair with personal belongings and call light within reach. All needs met.  Lavanda Pollack Mobility Specialist  Please contact via [employer] or  Rehab Office [phone]

## 2024-02-09 NOTE — Plan of Care (Signed)
  Problem: Coping: Goal: Ability to adjust to condition or change in health will improve Outcome: Progressing   Problem: Skin Integrity: Goal: Risk for impaired skin integrity will decrease Outcome: Progressing   Problem: Coping: Goal: Level of anxiety will decrease Outcome: Progressing   Problem: Safety: Goal: Ability to remain free from injury will improve Outcome: Progressing

## 2024-02-09 NOTE — Inpatient Diabetes Management (Signed)
 Inpatient Diabetes Program Recommendations  AACE/ADA: New Consensus Statement on Inpatient Glycemic Control (2015)  Target Ranges:  Prepandial:   less than 140 mg/dL      Peak postprandial:   less than 180 mg/dL (1-2 hours)      Critically ill patients:  140 - 180 mg/dL   Lab Results  Component Value Date   GLUCAP 68 (L) 02/09/2024   HGBA1C 14.3 (H) 01/24/2024    Diabetes history: DM2 Outpatient Diabetes medications: Lantus  20 units daily, Novolog  0-18 units QID, Amaryl  2 mg daily, Metformin  1000 mg BID Current orders for Inpatient glycemic control: Lantus  60 units Q24H, Novolog  15 units TID with meals, Novolog  0-20 units TID with meals, Novolog  0-5 units QHS   Inpatient Diabetes Program Recommendations:   Please consider : -increasing Lantus  to 65 units Q24H and meal coverage to Novolog  17 units TID with meals. -Decrease Novolog  correction to 0-15 units tid, 0-5 units hs  Thank you, Jovontae Banko E. Krysta Bloomfield, RN, MSN, CDCES  Diabetes Coordinator Inpatient Glycemic Control Team Team Pager [phone] (8am-5pm) 02/09/2024 12:39 PM

## 2024-02-09 NOTE — Progress Notes (Signed)
 Daily Progress Note Intern Pager: [phone]  Patient name: Nancy [name] Medical record number: [id number] Date of birth: Aug 03, 1965 Age: 58 y.o. Gender: female  Primary Care Provider: Earvin Johnston PARAS, FNP Consultants: none Code Status: Full  Pt Overview and Major Events to Date:  08/05: Admitted for pyelonephritis 08/11: Completed abx course for pyelonephritis 08/12: Medically stable for discharge pending SNF placement 08/19: Awaiting insurance auth for [facility]  Assessment and Plan:  Nancy [name] is a 58yo F admitted for pylenophritis. Hospital course has been uneventful. Pyelonephritis has been successfully treated with antibiotics. Patient has been working well with PT/OT however is still recommended for SNF at this time. Pertinent PMH/PSH includes T2DM, peripheral neuropathy, chronic bilateral hip pain s/p severe hip OA on crutches at home. Medically stable for discharge pending insurance auth for SNF placement. 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 A1c 14.3 this admission.  Blood sugars dropping to double digits since discussion about carb control, decrease meal time coverage.  - Continue Semglee  60u - With better carb control, decrease 15u to 10u meal time coverage inpatient - CBG check 4x daily with meals, and at bedtime - Increased gabapentin  to 700 mg TID for persistent neuropathic pain of the RLE Chronic health problem Substance Abuse: TOC consulted, cont nicotine  patch 21 mg daily, Nicotine  4 mg gum PRN. UDS 8/15 negative. Insomnia: cont melatonin, hydroxyzine  T2DM Neuropathy: continue Cymbalta  60 mg daily, gabapentin  300mg  TID,  Pressure ulcer: superficial, present on admission. Wound care management as ordered  Constipation: daily Miralax , experienced diarrhea with fiber supplements.  OA of Hip/Low back pain: uses  crutches at home, cont tylenol , ibuprofen , oxy 2.5 mg PRN;  Alcohol use disorder: Stable. Last drink 8/5. CIWA protocol d/c'd. Vitamin B 100 mg daily, Folic acid  1 mg daily. Hydroxyzine  25 mg 3 times daily as needed for anxiety.  FEN/GI: regular diet  PPx: Lovenox  Dispo:SNF pending insurance auth.   Subjective:  Patient was seen and examined at bedside. She complains of hip OA pain. She denies any urinary or vaginal symptoms today.   Objective: Temp:  [97.7 F (36.5 C)-98.2 F (36.8 C)] 97.7 F (36.5 C) (08/21 0527) Pulse Rate:  [81-92] 81 (08/21 0527) Resp:  [16] 16 (08/21 0527) BP: (94-121)/(61-89) 99/64 (08/21 0527) SpO2:  [90 %-98 %] 94 % (08/21 0527) Weight:  [65.4 kg] 65.4 kg (08/21 0433)  Physical Exam: General: A&O, NAD HEENT: No sign of trauma, EOM grossly intact Cardiac: RRR, no m/r/g Respiratory: CTAB, normal WOB, no w/c/r GI: Soft, NTTP, non-distended  Extremities: NTTP, no peripheral edema. Neuro: moves all four extremities appropriately. Psych: Appropriate mood and affect  Laboratory: Most recent CBC Lab Results  Component Value Date   WBC 10.0 01/25/2024   HGB 13.3 01/25/2024   HCT 38.6 01/25/2024   MCV 91.7 01/25/2024   PLT 240 01/25/2024   Most recent BMP    Latest Ref Rng & Units 01/31/2024    6:16 AM  BMP  Glucose 70 - 99 mg/dL 703   BUN 6 - 20 mg/dL 24   Creatinine 9.55 - 1.00 mg/dL 9.53   Sodium 864 - 854 mmol/L 132   Potassium 3.5 - 5.1 mmol/L 4.5   Chloride 98 - 111 mmol/L 95   CO2 22 - 32 mmol/L 30   Calcium  8.9 - 10.3 mg/dL 9.3    Lupie Credit, DO 02/09/2024, 7:13 AM  PGY-1, [facility] Family Medicine FPTS Intern pager: [phone], text pages welcome Secure chat group [facility] Teaching Service

## 2024-02-09 NOTE — Progress Notes (Signed)
 Physical Therapy Treatment Patient Details Name: Nancy [name] MRN: [id number] DOB: June 15, 1966 Today's Date: 02/09/2024   History of Present Illness 58 y.o. female presents to [facility] 01/24/24 with multiple falls and hypotension. On CIWA protocol with sepsis 2/2 pyelonephritis. PMH of T2DM, GERD, alcohol use disorder    PT Comments  Pt resting in bed on arrival and agreeable to session. Pt limited this session by increased fatigue and feeling fuzzy with mobility. Pt requiring grossly CGA throughout ambulation with RW for support and x1 standing rest break due to fatigue. Plan to trial rollator next session to provide pt with seated rest option. Pt continues to benefit from skilled PT services to progress toward functional mobility goals.     If plan is discharge home, recommend the following: A little help with walking and/or transfers;A little help with bathing/dressing/bathroom;Assist for transportation;Help with stairs or ramp for entrance   Can travel by private vehicle     Yes  Equipment Recommendations  None recommended by PT    Recommendations for Other Services       Precautions / Restrictions Precautions Precautions: Fall;Other (comment) Recall of Precautions/Restrictions: Intact Precaution/Restrictions Comments: watch BP Restrictions Weight Bearing Restrictions Per Provider Order: No     Mobility  Bed Mobility Overal bed mobility: Needs Assistance Bed Mobility: Supine to Sit, Sit to Supine     Supine to sit: Supervision Sit to supine: Supervision   General bed mobility comments: with increased time    Transfers Overall transfer level: Needs assistance Equipment used: Rolling walker (2 wheels) Transfers: Sit to/from Stand, Bed to chair/wheelchair/BSC Sit to Stand: Supervision           General transfer comment: close Supervision for safety, good recall for hand placement, increased time to come to standing with bil knees remaining flexed     Ambulation/Gait Ambulation/Gait assistance: Contact guard assist, Min assist Gait Distance (Feet): 110 Feet Assistive device: Rolling walker (2 wheels) Gait Pattern/deviations: Step-to pattern, Decreased stride length, Decreased dorsiflexion - right, Decreased dorsiflexion - left, Knee flexed in stance - right, Knee flexed in stance - left, Antalgic Gait velocity: decr     General Gait Details: pt ambulating with flexed posture with bil knees flexed in stance with low foot clearance, pt with heavy reliance on UE support to offload painful LEs, pt needing x1 standing rest break due to fatigue.   Stairs             Wheelchair Mobility     Tilt Bed    Modified Rankin (Stroke Patients Only)       Balance Overall balance assessment: Mild deficits observed, not formally tested, History of Falls Sitting-balance support: No upper extremity supported, Feet supported Sitting balance-Leahy Scale: Good     Standing balance support: Single extremity supported, Bilateral upper extremity supported, During functional activity, Reliant on assistive device for balance Standing balance-Leahy Scale: Poor Standing balance comment: reliant on RW for support                            Communication Communication Communication: No apparent difficulties  Cognition Arousal: Alert Behavior During Therapy: WFL for tasks assessed/performed, Impulsive                             Following commands: Intact      Cueing Cueing Techniques: Verbal cues  Exercises      General Comments General comments (skin  integrity, edema, etc.): VSS on RA, mild c/o fuzziness and feeling off this session      Pertinent Vitals/Pain Pain Assessment Pain Assessment: Faces Faces Pain Scale: Hurts a little bit Pain Location: bil hips Pain Descriptors / Indicators: Discomfort, Constant, Grimacing, Burning Pain Intervention(s): Monitored during session, Limited activity within  patient's tolerance    Home Living                          Prior Function            PT Goals (current goals can now be found in the care plan section) Acute Rehab PT Goals Patient Stated Goal: to not have any more falls PT Goal Formulation: With patient Time For Goal Achievement: 02/08/24 Progress towards PT goals: Progressing toward goals    Frequency    Min 2X/week      PT Plan      Co-evaluation              AM-PAC PT 6 Clicks Mobility   Outcome Measure  Help needed turning from your back to your side while in a flat bed without using bedrails?: A Little Help needed moving from lying on your back to sitting on the side of a flat bed without using bedrails?: A Little Help needed moving to and from a bed to a chair (including a wheelchair)?: A Little Help needed standing up from a chair using your arms (e.g., wheelchair or bedside chair)?: A Little Help needed to walk in hospital room?: A Little Help needed climbing 3-5 steps with a railing? : A Lot 6 Click Score: 17    End of Session   Activity Tolerance: Patient tolerated treatment well;Patient limited by fatigue;Patient limited by pain Patient left: in bed;with call bell/phone within reach Nurse Communication: Mobility status PT Visit Diagnosis: Other abnormalities of gait and mobility (R26.89);Muscle weakness (generalized) (M62.81);History of falling (Z91.81)     Time: 8485-8471 PT Time Calculation (min) (ACUTE ONLY): 14 min  Charges:    $Gait Training: 8-22 mins PT General Charges $$ ACUTE PT VISIT: 1 Visit                     [name] PTA Acute Rehabilitation Services Office: [phone]   [name] 02/09/2024, 4:08 PM

## 2024-02-10 ENCOUNTER — Inpatient Hospital Stay (HOSPITAL_COMMUNITY)

## 2024-02-10 DIAGNOSIS — R0600 Dyspnea, unspecified: Secondary | ICD-10-CM

## 2024-02-10 DIAGNOSIS — Z794 Long term (current) use of insulin: Secondary | ICD-10-CM | POA: Diagnosis not present

## 2024-02-10 DIAGNOSIS — E1165 Type 2 diabetes mellitus with hyperglycemia: Secondary | ICD-10-CM | POA: Diagnosis not present

## 2024-02-10 LAB — GLUCOSE, CAPILLARY
Glucose-Capillary: 165 mg/dL — ABNORMAL HIGH (ref 70–99)
Glucose-Capillary: 310 mg/dL — ABNORMAL HIGH (ref 70–99)
Glucose-Capillary: 144 mg/dL — ABNORMAL HIGH (ref 70–99)
Glucose-Capillary: 164 mg/dL — ABNORMAL HIGH (ref 70–99)
Glucose-Capillary: 249 mg/dL — ABNORMAL HIGH (ref 70–99)

## 2024-02-10 LAB — BASIC METABOLIC PANEL WITH GFR
Anion gap: 9 (ref 5–15)
BUN: 22 mg/dL — ABNORMAL HIGH (ref 6–20)
CO2: 28 mmol/L (ref 22–32)
Calcium: 9.1 mg/dL (ref 8.9–10.3)
Chloride: 98 mmol/L (ref 98–111)
Creatinine, Ser: 0.56 mg/dL (ref 0.44–1.00)
GFR, Estimated: >60 mL/min (ref >60)
Glucose, Bld: 370 mg/dL — ABNORMAL HIGH (ref 70–99)
Potassium: 4.2 mmol/L (ref 3.5–5.1)
Sodium: 135 mmol/L (ref 135–145)

## 2024-02-10 LAB — BLOOD GAS, VENOUS
Acid-Base Excess: 5.5 mmol/L — ABNORMAL HIGH (ref 0.0–2.0)
Bicarbonate: 32.4 mmol/L — ABNORMAL HIGH (ref 20.0–28.0)
O2 Saturation: 96.3 %
Patient temperature: 36.6
pCO2, Ven: 55 mmHg (ref 44–60)
pH, Ven: 7.38 (ref 7.25–7.43)
pO2, Ven: 69 mmHg — ABNORMAL HIGH (ref 32–45)

## 2024-02-10 LAB — CBC
HCT: 35.2 % — ABNORMAL LOW (ref 36.0–46.0)
Hemoglobin: 11.3 g/dL — ABNORMAL LOW (ref 12.0–15.0)
MCH: 31.3 pg (ref 26.0–34.0)
MCHC: 32.1 g/dL (ref 30.0–36.0)
MCV: 97.5 fL (ref 80.0–100.0)
Platelets: 256 K/uL (ref 150–400)
RBC: 3.61 MIL/uL — ABNORMAL LOW (ref 3.87–5.11)
RDW: 11.9 % (ref 11.5–15.5)
WBC: 9.2 K/uL (ref 4.0–10.5)
nRBC: 0 % (ref 0.0–0.2)

## 2024-02-10 LAB — MAGNESIUM: Magnesium: 1.4 mg/dL — ABNORMAL LOW (ref 1.7–2.4)

## 2024-02-10 MED ORDER — FUROSEMIDE 10 MG/ML IJ SOLN
20.0000 mg | Freq: Once | INTRAMUSCULAR | Status: AC
  Administered 2024-02-10: 20 mg via INTRAVENOUS
  Filled 2024-02-10: qty 2

## 2024-02-10 MED ORDER — MAGNESIUM SULFATE 2 GM/50ML IV SOLN
2.0000 g | Freq: Once | INTRAVENOUS | Status: AC
  Administered 2024-02-10: 2 g via INTRAVENOUS
  Filled 2024-02-10: qty 50

## 2024-02-10 NOTE — Assessment & Plan Note (Deleted)
 Substance Abuse: TOC consulted, cont nicotine  patch 21 mg daily, Nicotine  4 mg gum PRN. UDS 8/15 negative. Insomnia: cont melatonin, hydroxyzine  T2DM Neuropathy: continue Cymbalta  60 mg daily, gabapentin  300mg  TID,  Pressure ulcer: superficial, present on admission. Wound care management as ordered  Constipation: daily Miralax , experienced diarrhea with fiber supplements.  OA of Hip/Low back pain: uses crutches at home, cont tylenol , ibuprofen , oxy 2.5 mg PRN;  Alcohol use disorder: Stable. Last drink 8/5. CIWA protocol d/c'd. Vitamin B 100 mg daily, Folic acid  1 mg daily. Hydroxyzine  25 mg 3 times daily as needed for anxiety.

## 2024-02-10 NOTE — Progress Notes (Signed)
 OT Cancellation Note  Patient Details Name: Nancy [name] MRN: [id number] DOB: 03-22-1966   Cancelled Treatment:    Reason Eval/Treat Not Completed: Patient declined, no reason specified (Pt declined skilled OT session this day due to fatigue and not feeling well after needing to be placed on supplemental O2 due to SOB earlier this day. OT to reattempt at a later time as appropriate/available.)  Margarie Rockey HERO., OTR/L, MA Acute Rehab [phone]  Margarie FORBES Horns 02/10/2024, 4:44 PM

## 2024-02-10 NOTE — Plan of Care (Signed)
  Problem: Coping: Goal: Ability to adjust to condition or change in health will improve Outcome: Progressing   Problem: Fluid Volume: Goal: Ability to maintain a balanced intake and output will improve Outcome: Progressing   Problem: Skin Integrity: Goal: Risk for impaired skin integrity will decrease Outcome: Progressing   Problem: Tissue Perfusion: Goal: Adequacy of tissue perfusion will improve Outcome: Progressing   Problem: Health Behavior/Discharge Planning: Goal: Ability to manage health-related needs will improve Outcome: Progressing

## 2024-02-10 NOTE — Assessment & Plan Note (Addendum)
 Intermittent, O2 sats 89-92% on RA.  - 2L [place] O2 supplementation for O2 sats >94% - Incentive spirometry and teaching  - STAT EKG, CXR, BMP, CBC, VBG, Mag  -CXR significant for increased edema at bases  - EKG no ACS changes - Begin one-time diuresis with IV Lasix  20 mg and continue to monitor - Wean off O2 as tolerated

## 2024-02-10 NOTE — TOC Progression Note (Signed)
 Transition of Care [facility]) - Progression Note    Patient Details  Name: Nancy [name] MRN: [id number] Date of Birth: Aug 27, 1965  [facility] CM/SW Contact  Mabry Santarelli LITTIE Mo[place], [place] Phone Number: 02/10/2024, 3:19 PM  Clinical Narrative:    CSW spoke with Isaiah with [facility], facility has received auth approval for pt, next review date is 9/21. CSW confirmed bed availability, Isaiah stated pt can be admitted when medically ready for DC.    Expected Discharge Plan: Skilled Nursing Facility Barriers to Discharge: Continued Medical Work up, English as a second language teacher, SNF Pending bed offer               Expected Discharge Plan and Services       Living arrangements for the past 2 months: Single Family Home                                       Social Drivers of Health (SDOH) Interventions SDOH Screenings   Food Insecurity: Food Insecurity Present (01/24/2024)  Housing: High Risk (01/24/2024)  Transportation Needs: Unmet Transportation Needs (01/24/2024)  Utilities: At Risk (01/24/2024)  Financial Resource Strain: Not on File (10/08/2021)   [facility]  Physical Activity: Not on File (10/08/2021)   Received from [facility]  Social Connections: Socially Isolated (10/26/2023)  Stress: Not on File (10/08/2021)   Received from [facility]  Tobacco Use: High Risk (01/24/2024)    Readmission Risk Interventions     No data to display

## 2024-02-10 NOTE — Progress Notes (Addendum)
 Daily Progress Note Intern Pager: [phone]  Patient name: Nancy [name] Medical record number: [id number] Date of birth: 1966-01-19 Age: 58 y.o. Gender: female  Primary Care Provider: Earvin Johnston PARAS, FNP Consultants: None Code Status: Full  Pt Overview and Major Events to Date:  08/05: Admitted for pyelonephritis 08/11: Completed abx course for pyelonephritis 08/12: Medically stable for discharge pending SNF placement 08/19: Awaiting insurance auth for [facility]  Assessment and Plan:  Nancy [name] is a 58yo F admitted for pylenophritis. Hospital course has been uneventful. Pyelonephritis has been successfully treated with antibiotics. Patient has been working well with PT/OT however is still recommended for SNF at this time. Pertinent PMH/PSH includes T2DM, peripheral neuropathy, chronic bilateral hip pain s/p severe hip OA on crutches at home. Medically stable for discharge pending insurance auth for SNF placement. Assessment & Plan SOB (shortness of breath) Intermittent, O2 sats 89-92% on RA.  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 A1c 14.3 this admission.  - Continue Semglee  60u - Continue 10u meal time coverage inpatient - CBG check 4x daily with meals, and at bedtime - Increased gabapentin  to 700 mg TID for persistent neuropathic pain of the RLE Chronic health problem Substance Abuse: TOC consulted, cont nicotine  patch 21 mg daily, Nicotine  4 mg gum PRN. UDS 8/15 negative. Insomnia: cont melatonin, hydroxyzine  T2DM Neuropathy: continue Cymbalta  60 mg daily,  gabapentin  300mg  TID,  Pressure ulcer: superficial, present on admission. Wound care management as ordered  Constipation: daily Miralax , experienced diarrhea with fiber supplements.  OA of Hip/Low back pain: uses crutches at home, cont tylenol , ibuprofen , oxy 2.5 mg PRN;  Alcohol use disorder: Stable. Last drink 8/5. CIWA protocol d/c'd. Vitamin B 100 mg daily, Folic acid  1 mg daily. Hydroxyzine  25 mg 3 times daily as needed for anxiety.  FEN/GI: regular diet  PPx: Lovenox  Dispo:SNF in 2-3 days.   Subjective:  Patient was seen and examined at bedside. She complains of intermittent SOB, denies CP, diaphoresis, N/V/D, fevers, abdominal pain, or any other complaints at this time. She has no trouble breathing and speaks to me in clear, full sentences while lying comfortably in bed however her O2 sats range between 89-92%.   Objective: Temp:  [98 F (36.7 C)-98.9 F (37.2 C)] 98.1 F (36.7 C) (08/22 0802) Pulse Rate:  [80-100] 87 (08/22 0802) Resp:  [15-18] 16 (08/22 0802) BP: (97-112)/(61-69) 104/64 (08/22 0802) SpO2:  [87 %-94 %] 88 % (08/22 0802) Weight:  [66.1 kg] 66.1 kg (08/22 0414)  Physical Exam: General: lying comfortably in bed, A&O x4, NAD Cardiovascular: RRR, no m/r/g, 2+ radial pulses b/l Respiratory: normal WOB, diminished breath sounds at b/l lung bases, good respiratory effort, good air movement otherwise bilaterally, no w/r/r Abdomen: soft, non-tender, NTTP, BS present Extremities: moving all extremities equally  Laboratory: Most recent CBC Lab Results  Component Value Date   WBC 10.0 01/25/2024   HGB 13.3 01/25/2024   HCT 38.6 01/25/2024   MCV 91.7 01/25/2024   PLT 240 01/25/2024   Most recent BMP    Latest Ref Rng & Units 01/31/2024  6:16 AM  BMP  Glucose 70 - 99 mg/dL 703   BUN 6 - 20 mg/dL 24   Creatinine 9.55 - 1.00 mg/dL 9.53   Sodium 864 - 854 mmol/L 132   Potassium 3.5 - 5.1 mmol/L 4.5   Chloride 98 - 111 mmol/L 95   CO2 22 - 32 mmol/L 30    Calcium  8.9 - 10.3 mg/dL 9.3     Lupie Credit, DO 02/10/2024, 8:49 AM  PGY-1, [facility] FPTS Intern pager: [phone], text pages welcome Secure chat group [facility] Teaching Service

## 2024-02-10 NOTE — Plan of Care (Signed)
 FMTS Interim Progress Note  S: Patient states her breathing is much better from this AM after Lasix  20mg .  O: BP 104/64 (BP Location: Left Arm)   Pulse 87   Temp 98.1 F (36.7 C) (Oral)   Resp 16   Ht 5' 9 (1.753 m)   Wt 66.1 kg   SpO2 93%   BMI 21.52 kg/m    General: Alert, sitting comfortably in bed, NAD.  Pulmonary: Normal work of breathing. CTAB with no wheezes or crackles present, somewhat diminished breath sounds in lower lung fields.  A/P: SOB Improving  - Will hold on diuresing further given improvement in sx - Consider re-dosing tomorrow  Jerrie Gathers, DO 02/10/2024, 3:33 PM PGY-1, [facility] Family Medicine Service pager [phone]

## 2024-02-10 NOTE — Assessment & Plan Note (Signed)
 Substance Abuse: TOC consulted, cont nicotine  patch 21 mg daily, Nicotine  4 mg gum PRN. UDS 8/15 negative. Insomnia: cont melatonin, hydroxyzine  T2DM Neuropathy: continue Cymbalta  60 mg daily, gabapentin  300mg  TID,  Pressure ulcer: superficial, present on admission. Wound care management as ordered  Constipation: daily Miralax , experienced diarrhea with fiber supplements.  OA of Hip/Low back pain: uses crutches at home, cont tylenol , ibuprofen , oxy 2.5 mg PRN;  Alcohol use disorder: Stable. Last drink 8/5. CIWA protocol d/c'd. Vitamin B 100 mg daily, Folic acid  1 mg daily. Hydroxyzine  25 mg 3 times daily as needed for anxiety.

## 2024-02-10 NOTE — Plan of Care (Signed)
 Patient satting 89-90 on RA, O2 2L remains.  Reports feeling better than this morning.  Has been performing breathing exercises with incentive spirometer.   Problem: Education: Goal: Ability to describe self-care measures that may prevent or decrease complications (Diabetes Survival Skills Education) will improve Outcome: Progressing Goal: Individualized Educational Video(s) Outcome: Progressing   Problem: Coping: Goal: Ability to adjust to condition or change in health will improve Outcome: Progressing   Problem: Fluid Volume: Goal: Ability to maintain a balanced intake and output will improve Outcome: Progressing   Problem: Health Behavior/Discharge Planning: Goal: Ability to identify and utilize available resources and services will improve Outcome: Progressing Goal: Ability to manage health-related needs will improve Outcome: Progressing

## 2024-02-10 NOTE — Assessment & Plan Note (Signed)
 Diabetic neuropathy improving slightly with interval increase in gabapentin  yet still persistent. A1c 14.3 this admission.  - Continue Semglee  60u - Continue 10u meal time coverage inpatient - CBG check 4x daily with meals, and at bedtime - Increased gabapentin  to 700 mg TID for persistent neuropathic pain of the RLE

## 2024-02-10 NOTE — Assessment & Plan Note (Deleted)
 Diabetic neuropathy improving slightly with interval increase in gabapentin  yet still persistent. A1c 14.3 this admission.  Blood sugars dropping to double digits since discussion about carb control, decrease meal time coverage.  - Continue Semglee  60u - With better carb control, decrease 15u to 10u meal time coverage inpatient - CBG check 4x daily with meals, and at bedtime - Increased gabapentin  to 700 mg TID for persistent neuropathic pain of the RLE

## 2024-02-10 NOTE — TOC Progression Note (Signed)
 Transition of Care [facility]) - Progression Note    Patient Details  Name: Nancy [name] MRN: [id number] Date of Birth: May 16, 1966  [facility]) CM/SW Contact  Kresha Abelson LITTIE Moose, [place] Phone Number: 02/10/2024, 9:28 AM  Clinical Narrative:    CSW still awaiting auth approval for [facility]. CSW will continue to follow.   Expected Discharge Plan: Skilled Nursing Facility Barriers to Discharge: Continued Medical Work up, English as a second language teacher, SNF Pending bed offer               Expected Discharge Plan and Services       Living arrangements for the past 2 months: Single Family Home                                       Social Drivers of Health (SDOH) Interventions SDOH Screenings   Food Insecurity: Food Insecurity Present (01/24/2024)  Housing: High Risk (01/24/2024)  Transportation Needs: Unmet Transportation Needs (01/24/2024)  Utilities: At Risk (01/24/2024)  Financial Resource Strain: Not on File (10/08/2021)   [facility] from [facility]  Physical Activity: Not on File (10/08/2021)   Received from [facility]  Social Connections: Socially Isolated (10/26/2023)  Stress: Not on File (10/08/2021)   Received from [facility]  Tobacco Use: High Risk (01/24/2024)    Readmission Risk Interventions     No data to display

## 2024-02-10 NOTE — Inpatient Diabetes Management (Incomplete)
 Inpatient Diabetes Program Recommendations  AACE/ADA: New Consensus Statement on Inpatient Glycemic Control (2015)  Target Ranges:  Prepandial:   less than 140 mg/dL      Peak postprandial:   less than 180 mg/dL (1-2 hours)      Critically ill patients:  140 - 180 mg/dL   Lab Results  Component Value Date   GLUCAP 310 (H) 02/10/2024   HGBA1C 14.3 (H) 01/24/2024    Review of Glycemic Control  Latest Reference Range & Units 02/09/24 07:21 02/09/24 12:07 02/09/24 12:51 02/09/24 16:26 02/09/24 20:51 02/10/24 04:09 02/10/24 08:00  Glucose-Capillary 70 - 99 mg/dL 857 (H) 68 (L) 898 (H) 344 (H) 166 (H) 165 (H) 310 (H)  (H): Data is abnormally high (L): Data is abnormally low Diabetes history: DM2 Outpatient Diabetes medications: Lantus  20 units daily, Novolog  0-18 units QID, Amaryl  2 mg daily, Metformin  1000 mg BID Current orders for Inpatient glycemic control: Lantus  60 units Q24H, Novolog  15 units TID with meals, Novolog  0-20 units TID with meals, Novolog  0-5 units QHS   Inpatient Diabetes Program Recommendations:   Please consider : -increasing Lantus  to 65 units Q24H  -Decrease Novolog  correction to 0-9 units tid, 0-5 units hs   Thanks, Tinnie Minus, MSN, RNC-OB Diabetes Coordinator [phone] (8a-5p)

## 2024-02-11 ENCOUNTER — Inpatient Hospital Stay (HOSPITAL_COMMUNITY)

## 2024-02-11 DIAGNOSIS — E1169 Type 2 diabetes mellitus with other specified complication: Secondary | ICD-10-CM | POA: Diagnosis not present

## 2024-02-11 DIAGNOSIS — R0609 Other forms of dyspnea: Secondary | ICD-10-CM | POA: Diagnosis not present

## 2024-02-11 DIAGNOSIS — N12 Tubulo-interstitial nephritis, not specified as acute or chronic: Secondary | ICD-10-CM | POA: Diagnosis not present

## 2024-02-11 DIAGNOSIS — E1165 Type 2 diabetes mellitus with hyperglycemia: Secondary | ICD-10-CM | POA: Diagnosis not present

## 2024-02-11 DIAGNOSIS — Z794 Long term (current) use of insulin: Secondary | ICD-10-CM | POA: Diagnosis not present

## 2024-02-11 LAB — CBC WITH DIFFERENTIAL/PLATELET
Abs Immature Granulocytes: 0.1 K/uL — ABNORMAL HIGH (ref 0.00–0.07)
Basophils Absolute: 0.1 K/uL (ref 0.0–0.1)
Basophils Relative: 1 %
Eosinophils Absolute: 0.3 K/uL (ref 0.0–0.5)
Eosinophils Relative: 2 %
HCT: 38.3 % (ref 36.0–46.0)
Hemoglobin: 12.2 g/dL (ref 12.0–15.0)
Immature Granulocytes: 1 %
Lymphocytes Relative: 26 %
Lymphs Abs: 2.6 K/uL (ref 0.7–4.0)
MCH: 31.2 pg (ref 26.0–34.0)
MCHC: 31.9 g/dL (ref 30.0–36.0)
MCV: 98 fL (ref 80.0–100.0)
Monocytes Absolute: 1 K/uL (ref 0.1–1.0)
Monocytes Relative: 10 %
Neutro Abs: 6.1 K/uL (ref 1.7–7.7)
Neutrophils Relative %: 60 %
Platelets: 300 K/uL (ref 150–400)
RBC: 3.91 MIL/uL (ref 3.87–5.11)
RDW: 11.9 % (ref 11.5–15.5)
WBC: 10.2 K/uL (ref 4.0–10.5)
nRBC: 0 % (ref 0.0–0.2)

## 2024-02-11 LAB — GLUCOSE, CAPILLARY
Glucose-Capillary: 129 mg/dL — ABNORMAL HIGH (ref 70–99)
Glucose-Capillary: 191 mg/dL — ABNORMAL HIGH (ref 70–99)
Glucose-Capillary: 269 mg/dL — ABNORMAL HIGH (ref 70–99)
Glucose-Capillary: 284 mg/dL — ABNORMAL HIGH (ref 70–99)
Glucose-Capillary: 339 mg/dL — ABNORMAL HIGH (ref 70–99)
Glucose-Capillary: 352 mg/dL — ABNORMAL HIGH (ref 70–99)
Glucose-Capillary: 77 mg/dL (ref 70–99)

## 2024-02-11 LAB — MAGNESIUM: Magnesium: 1.6 mg/dL — ABNORMAL LOW (ref 1.7–2.4)

## 2024-02-11 LAB — ECHOCARDIOGRAM COMPLETE
Area-P 1/2: 4.06 cm2
Height: 69 in
S' Lateral: 2.9 cm
Weight: 2331.58 [oz_av]

## 2024-02-11 LAB — BASIC METABOLIC PANEL WITH GFR
Anion gap: 5 (ref 5–15)
BUN: 22 mg/dL — ABNORMAL HIGH (ref 6–20)
CO2: 30 mmol/L (ref 22–32)
Calcium: 9.5 mg/dL (ref 8.9–10.3)
Chloride: 100 mmol/L (ref 98–111)
Creatinine, Ser: 0.42 mg/dL — ABNORMAL LOW (ref 0.44–1.00)
GFR, Estimated: >60 mL/min (ref >60)
Glucose, Bld: 278 mg/dL — ABNORMAL HIGH (ref 70–99)
Potassium: 4.3 mmol/L (ref 3.5–5.1)
Sodium: 135 mmol/L (ref 135–145)

## 2024-02-11 MED ORDER — INSULIN ASPART 100 UNIT/ML IJ SOLN
18.0000 [IU] | Freq: Three times a day (TID) | INTRAMUSCULAR | Status: DC
  Administered 2024-02-11 – 2024-02-15 (×11): 18 [IU] via SUBCUTANEOUS

## 2024-02-11 MED ORDER — OXYCODONE HCL 5 MG PO TABS
5.0000 mg | ORAL_TABLET | Freq: Three times a day (TID) | ORAL | Status: DC | PRN
  Administered 2024-02-11 – 2024-02-16 (×13): 5 mg via ORAL
  Filled 2024-02-11 (×13): qty 1

## 2024-02-11 NOTE — Plan of Care (Signed)
 Spoke with Dr. Mona with cardiology, he reviewed this echo and the one prior and it appears this mobile calcified material was present on her prior echo in May 2025 as well, no need for TEE unless clinical concerns for endocarditis Appreciate cardiology assistance with this patient

## 2024-02-11 NOTE — Assessment & Plan Note (Addendum)
 Glucose 144-370 last 24 hours. Received 54 units of insulin  aspart in the last 24 hours.  - Continue Semglee  60u - increase Aspart to 18 units with meals - Continue resistant SSI - CBG check 4x daily with meals, and at bedtime - Continue gabapentin  to 700 mg TID for persistent neuropathic pain of the RLE

## 2024-02-11 NOTE — Progress Notes (Signed)
 SPO2 Resting: 98% Walking 88% Standing/Pause Walking: 91%

## 2024-02-11 NOTE — Assessment & Plan Note (Addendum)
 Patient now on RA with no SOB. Clinically, she continues to have diminished breath sounds at the bases. No output documented despite lasix  treatment yesterday and strict I/Os. Potential discharge this evening if workup is normal.  - Assess 6 minute walk test today - If dyspnea returns in daytime, consider repeat chest xray vs redose lasix  - echo ordered - Continue incentive spirometry - Wean off O2 as tolerated

## 2024-02-11 NOTE — Assessment & Plan Note (Signed)
 Substance Abuse: TOC consulted, cont nicotine  patch 21 mg daily, Nicotine  4 mg gum PRN. UDS 8/15 negative. Insomnia: cont melatonin, hydroxyzine  T2DM Neuropathy: continue Cymbalta  60 mg daily, gabapentin  300mg  TID,  Pressure ulcer: superficial, present on admission. Wound care management as ordered  Constipation: daily Miralax , experienced diarrhea with fiber supplements.  OA of Hip/Low back pain: uses crutches at home, cont tylenol , ibuprofen , oxy 2.5 mg PRN;  Alcohol use disorder: Stable. Last drink 8/5. CIWA protocol d/c'd. Vitamin B 100 mg daily, Folic acid  1 mg daily. Hydroxyzine  25 mg 3 times daily as needed for anxiety.

## 2024-02-11 NOTE — Plan of Care (Signed)
  Problem: Coping: Goal: Ability to adjust to condition or change in health will improve Outcome: Progressing   Problem: Health Behavior/Discharge Planning: Goal: Ability to manage health-related needs will improve Outcome: Progressing   Problem: Nutritional: Goal: Maintenance of adequate nutrition will improve Outcome: Progressing   Problem: Health Behavior/Discharge Planning: Goal: Ability to manage health-related needs will improve Outcome: Progressing   Problem: Clinical Measurements: Goal: Diagnostic test results will improve Outcome: Progressing Goal: Respiratory complications will improve Outcome: Progressing

## 2024-02-11 NOTE — Progress Notes (Signed)
 Daily Progress Note Intern Pager: [phone]  Patient name: Nancy [name] Medical record number: [id number] Date of birth: 1965/09/10 Age: 58 y.o. Gender: female  Primary Care Provider: Earvin Johnston PARAS, FNP Consultants: None Code Status: Full  Pt Overview and Major Events to Date:  08/05: Admitted for pyelonephritis 08/11: Completed abx course for pyelonephritis 08/12: Medically stable for discharge pending SNF placement 08/19: Awaiting insurance auth for [facility] 08/22: Approved auth and bed available at [place], but new O2 requirement  Medical Decision Making:  58 year old female initially admitted for pyelonephritis that is resolved, with dyspnea requiring O2 supplementation yesterday that is now improved. Suspect dyspnea secondary to interstitial edema from unclear origin.  Assessment & Plan Dyspnea Patient now on RA with no SOB. Clinically, she continues to have diminished breath sounds at the bases. No output documented despite lasix  treatment yesterday and strict I/Os. Potential discharge this evening if workup is normal.  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 Received 54 units of insulin  aspart in the last 24 hours.  - Continue Semglee  60u - increase Aspart to 18 units with meals - Continue resistant SSI - CBG check 4x daily with meals, and at bedtime - Continue gabapentin  to 700 mg TID for persistent neuropathic pain of the RLE Chronic health problem Substance Abuse: TOC consulted, cont nicotine  patch 21 mg daily, Nicotine  4 mg gum PRN. UDS 8/15 negative. Insomnia: cont melatonin, hydroxyzine  T2DM Neuropathy: continue Cymbalta  60 mg daily, gabapentin  300mg  TID,  Pressure ulcer: superficial, present on admission. Wound  care management as ordered  Constipation: daily Miralax , experienced diarrhea with fiber supplements.  OA of Hip/Low back pain: uses crutches at home, cont tylenol , ibuprofen , oxy 2.5 mg PRN;  Alcohol use disorder: Stable. Last drink 8/5. CIWA protocol d/c'd. Vitamin B 100 mg daily, Folic acid  1 mg daily. Hydroxyzine  25 mg 3 times daily as needed for anxiety.  FEN/GI: regular PPx: lovenox  Dispo:SNF pending clinical improvement .   Subjective:  She reports improved SOB with no difficulty now. No other complaints. Just had a BM this morning.  Objective: Temp:  [97.9 F (36.6 C)-98.4 F (36.9 C)] 97.9 F (36.6 C) (08/23 0459) Pulse Rate:  [81-94] 81 (08/23 0459) Resp:  [16-18] 18 (08/23 0459) BP: (97-109)/(53-71) 109/71 (08/23 0459) SpO2:  [88 %-94 %] 90 % (08/23 0459) Weight:  [66.1 kg] 66.1 kg (08/23 0459) Physical Exam: General: sleeping comfortably in bed Cardiovascular: faint heart sounds, RRR, no m/r/g, no JVD Respiratory: Apices CTAB, diminished breath sounds at the bases bilaterally. Abdomen: soft, nontender Extremities: no peripheral edema  Laboratory: Most recent CBC Lab Results  Component Value Date   WBC 9.2 02/10/2024   HGB 11.3 (L) 02/10/2024   HCT 35.2 (L) 02/10/2024   MCV 97.5 02/10/2024   PLT 256 02/10/2024   Most recent BMP    Latest Ref Rng & Units 02/10/2024    9:18 AM  BMP  Glucose 70 - 99 mg/dL 629   BUN 6 - 20 mg/dL 22   Creatinine 9.55 - 1.00 mg/dL 9.43   Sodium 864 - 854 mmol/L 135   Potassium 3.5 - 5.1 mmol/L 4.2   Chloride 98 - 111 mmol/L 98   CO2 22 - 32 mmol/L 28  Calcium  8.9 - 10.3 mg/dL 9.1    Mg not resulted yet.  Imaging/Diagnostic Tests: Chest xr 02/10/24 Rads impression: Worsening of bronchial thickening and interstitial pulmonary markings most consistent with mild interstitial edema. Acute infectious bronchitis and interstitial pneumonitis could have a similar appearance.  Alena Morrison, [place], MD 02/11/2024, 6:25  AM  PGY-1, [facility] Family Medicine FPTS Intern pager: [phone], text pages welcome Secure chat group [facility] Teaching Service

## 2024-02-12 ENCOUNTER — Inpatient Hospital Stay (HOSPITAL_COMMUNITY)

## 2024-02-12 DIAGNOSIS — E1165 Type 2 diabetes mellitus with hyperglycemia: Secondary | ICD-10-CM | POA: Diagnosis not present

## 2024-02-12 DIAGNOSIS — Z794 Long term (current) use of insulin: Secondary | ICD-10-CM | POA: Diagnosis not present

## 2024-02-12 DIAGNOSIS — N12 Tubulo-interstitial nephritis, not specified as acute or chronic: Secondary | ICD-10-CM | POA: Diagnosis not present

## 2024-02-12 LAB — GLUCOSE, CAPILLARY
Glucose-Capillary: 212 mg/dL — ABNORMAL HIGH (ref 70–99)
Glucose-Capillary: 118 mg/dL — ABNORMAL HIGH (ref 70–99)
Glucose-Capillary: 385 mg/dL — ABNORMAL HIGH (ref 70–99)
Glucose-Capillary: 156 mg/dL — ABNORMAL HIGH (ref 70–99)

## 2024-02-12 LAB — BRAIN NATRIURETIC PEPTIDE: B Natriuretic Peptide: 21.2 pg/mL (ref 0.0–100.0)

## 2024-02-12 LAB — D-DIMER, QUANTITATIVE: D-Dimer, Quant: 0.54 ug{FEU}/mL — ABNORMAL HIGH (ref 0.00–0.50)

## 2024-02-12 MED ORDER — ALBUTEROL SULFATE HFA 108 (90 BASE) MCG/ACT IN AERS: 2.0000 | INHALATION_SPRAY | RESPIRATORY_TRACT | Status: DC | PRN

## 2024-02-12 MED ORDER — FUROSEMIDE 10 MG/ML IJ SOLN
20.0000 mg | Freq: Every day | INTRAMUSCULAR | Status: DC
  Administered 2024-02-12 – 2024-02-13 (×2): 20 mg via INTRAVENOUS
  Filled 2024-02-12 (×2): qty 2

## 2024-02-12 MED ORDER — ALBUTEROL SULFATE (2.5 MG/3ML) 0.083% IN NEBU: 2.5000 mg | INHALATION_SOLUTION | RESPIRATORY_TRACT | Status: DC | PRN

## 2024-02-12 NOTE — Assessment & Plan Note (Signed)
 Glucose 77-352 last 24 hours.  - Continue Semglee  60u - Continue Aspart 18 units with meals - Continue resistant SSI - CBG check 4x daily with meals, and at bedtime - Continue gabapentin  to 700 mg TID for persistent neuropathic pain of the RLE

## 2024-02-12 NOTE — Progress Notes (Signed)
 Pt complaint 6X loose stool reported to [place], DO via secured chat

## 2024-02-12 NOTE — Progress Notes (Signed)
 Daily Progress Note Intern Pager: [phone]  Patient name: Nancy [name] Medical record number: [id number] Date of birth: [birth year] Age: 58 y.o. Gender: female  Primary Care Provider: Earvin Johnston PARAS, FNP Consultants: None Code Status: Full  Pt Overview and Major Events to Date:  08/05: Admitted for pyelonephritis 08/11: Completed abx course for pyelonephritis 08/12: Medically stable for discharge pending SNF placement 08/19: Awaiting insurance auth for [place] [facility] 08/22: Approved auth and bed available at [place], but new O2 requirement  Assessment and Plan: 58 year old female initially admitted for pyelonephritis that is resolved, with dyspnea requiring O2 supplementation that is now improving. Suspect dyspnea secondary to interstitial edema from unclear origin  Assessment & Plan Dyspnea Patient now on 2L [place] for comfort, no SOB, CP, cough - SPO2 resting 98%, walking 88%, standing 91% - echo EF improved (60-65%) since 10/2023 - repeat CXR - Continue incentive spirometry - Wean off O2 as tolerated Hyperglycemia due to diabetes mellitus (HCC) Glucose 77-352 last 24 hours.  - Continue Semglee  60u - Continue Aspart 18 units with meals - Continue resistant SSI - CBG check 4x daily with meals, and at bedtime - Continue gabapentin  to 700 mg TID for persistent neuropathic pain of the RLE Chronic health problem Substance Abuse: TOC consulted, cont nicotine  patch 21 mg daily, Nicotine  4 mg gum PRN. UDS 8/15 negative. Insomnia: cont melatonin, hydroxyzine  T2DM Neuropathy: continue Cymbalta  60 mg daily, gabapentin  300mg  TID,  Pressure ulcer: superficial, present on admission. Wound care management as ordered  Constipation: d/c Miralax  -> pt no longer wanting to take this OA of Hip/Low back pain: uses crutches at home, cont tylenol , ibuprofen , oxy 5 mg q8h PRN;  Alcohol use disorder: Stable. Last drink 8/5. CIWA protocol d/c'd. Vitamin B 100 mg daily, Folic acid  1 mg daily.  Hydroxyzine  25 mg 3 times daily as needed for anxiety.  FEN/GI: Regular PPx: Lovenox  Dispo:SNF pending clinical improvement  Subjective:  Patient is doing well today.  She is still complaining of some shortness of breath that comes and goes, will repeat chest x-ray.  Patient says she had been using oxygen all day yesterday and last night, however nursing says patient was not on oxygen overnight.  Patient also did well with ambulatory O2 monitoring yesterday.  No new complaints today.  Objective: Temp:  [97.9 F (36.6 C)-98.4 F (36.9 C)] 97.9 F (36.6 C) (08/24 0345) Pulse Rate:  [83-96] 87 (08/24 0345) Resp:  [15-18] 18 (08/24 0345) BP: (103-122)/(68-89) 110/89 (08/24 0345) SpO2:  [88 %-98 %] 98 % (08/24 0345) Physical Exam: General: Resting comfortably in bed, NAD Cardiovascular: RRR, no r/m/g Respiratory: CTAB Abdomen: Soft, nontender, nondistended Extremities: Nonedematous extremities, well-perfused  Laboratory: Most recent CBC Lab Results  Component Value Date   WBC 10.2 02/11/2024   HGB 12.2 02/11/2024   HCT 38.3 02/11/2024   MCV 98.0 02/11/2024   PLT 300 02/11/2024   Most recent BMP    Latest Ref Rng & Units 02/11/2024    7:16 AM  BMP  Glucose 70 - 99 mg/dL 721   BUN 6 - 20 mg/dL 22   Creatinine 9.55 - 1.00 mg/dL 9.57   Sodium 864 - 854 mmol/L 135   Potassium 3.5 - 5.1 mmol/L 4.3   Chloride 98 - 111 mmol/L 100   CO2 22 - 32 mmol/L 30   Calcium  8.9 - 10.3 mg/dL 9.5    Echo: Radiologist impression: 1. Left ventricular ejection fraction, by estimation, is 60 to 65%. Left  ventricular ejection fraction by 3D volume is 60 %. The left ventricle has  normal function. The left ventricle has no regional wall motion  abnormalities. Left ventricular diastolic   parameters are consistent with Grade I diastolic dysfunction (impaired  relaxation).   2. Right ventricular systolic function is normal. The right ventricular  size is normal. Tricuspid regurgitation signal  is inadequate for assessing  PA pressure.   3. The mitral valve is degenerative. Trivial mitral valve regurgitation.   4. There appears to be a mobile, calcified mass on the non-coronary cusp  of the aortic valve seen in multiple views, which may respresent old  vegetation. . The aortic valve is tricuspid. Aortic valve regurgitation is  not visualized. Aortic valve  sclerosis/calcification is present, without any evidence of aortic  stenosis.   5. The inferior vena cava is normal in size with <50% respiratory  variability, suggesting right atrial pressure of 8 mmHg.   [name], DO 02/12/2024, 7:51 AM  PGY-1, [facility] Family [facility] FPTS Intern pager: [phone], text pages welcome Secure chat group [facility] Teaching Service

## 2024-02-12 NOTE — Progress Notes (Signed)
 Pt awake all night, pt likes to drink coffee frequently. Pt had incontinent large episode of diarrhea. Linens changed, pt took shower. Wound to R buttock redressed as well as gebhardts and new foam placed on sacrum. Pt denies feeling SOB, did not wear O2 at all during PM shift. Pt encouraged and performing IS frequently.

## 2024-02-12 NOTE — Plan of Care (Signed)
  Problem: Coping: Goal: Ability to adjust to condition or change in health will improve Outcome: Progressing   Problem: Fluid Volume: Goal: Ability to maintain a balanced intake and output will improve Outcome: Progressing   Problem: Nutritional: Goal: Maintenance of adequate nutrition will improve Outcome: Progressing   Problem: Education: Goal: Knowledge of General Education information will improve Description: Including pain rating scale, medication(s)/side effects and non-pharmacologic comfort measures Outcome: Progressing   Problem: Health Behavior/Discharge Planning: Goal: Ability to manage health-related needs will improve Outcome: Progressing   Problem: Clinical Measurements: Goal: Will remain free from infection Outcome: Progressing Goal: Diagnostic test results will improve Outcome: Progressing   Problem: Nutrition: Goal: Adequate nutrition will be maintained Outcome: Progressing

## 2024-02-12 NOTE — Plan of Care (Signed)

## 2024-02-12 NOTE — Plan of Care (Addendum)
 Patient continues to have desaturations on room air.  Desatted to 88% while walking on room air. CXR with continued atelectasis and small pleural effusion.  Still needing 2L [place], not on oxygen at home.  Plan: - Obtain D-dimer - Obtain BNP - Redose Lasix , 20 mg IV daily - Start albuterol  inhaler 2 puffs every 4 hours as needed, history of asthma   6: 50 p.m. Age-adjusted D-dimer, VTE unlikely. BNP unremarkable. We will see how Lasix , and albuterol  does for her breathing.  We will reassess in the morning. Consider sending home with oxygen as she has met home O2 eligibility.

## 2024-02-12 NOTE — Assessment & Plan Note (Addendum)
 Patient now on 2L Nancy [name] for comfort, no SOB, CP, cough - SPO2 resting 98%, walking 88%, standing 91% - echo EF improved (60-65%) since 10/2023 - repeat CXR - Continue incentive spirometry - Wean off O2 as tolerated

## 2024-02-12 NOTE — Assessment & Plan Note (Addendum)
 Substance Abuse: TOC consulted, cont nicotine  patch 21 mg daily, Nicotine  4 mg gum PRN. UDS 8/15 negative. Insomnia: cont melatonin, hydroxyzine  T2DM Neuropathy: continue Cymbalta  60 mg daily, gabapentin  300mg  TID,  Pressure ulcer: superficial, present on admission. Wound care management as ordered  Constipation: d/c Miralax  -> pt no longer wanting to take this OA of Hip/Low back pain: uses crutches at home, cont tylenol , ibuprofen , oxy 5 mg q8h PRN;  Alcohol use disorder: Stable. Last drink 8/5. CIWA protocol d/c'd. Vitamin B 100 mg daily, Folic acid  1 mg daily. Hydroxyzine  25 mg 3 times daily as needed for anxiety.

## 2024-02-12 NOTE — Progress Notes (Signed)
 Mobility Specialist Progress Note:   02/12/24 1228  Mobility  Activity Ambulated with assistance (In hallway)  Level of Assistance Standby assist, set-up cues, supervision of patient - no hands on  Assistive Device Front wheel walker  Distance Ambulated (ft) 125 ft (*2)  Activity Response Tolerated well  Mobility Referral Yes  Mobility visit 1 Mobility  Mobility Specialist Start Time (ACUTE ONLY) 1125  Mobility Specialist Stop Time (ACUTE ONLY) 1139  Mobility Specialist Time Calculation (min) (ACUTE ONLY) 14 min   Received pt in bed and agreeable to mobility. No physical assistance needed. C/o RLE pain, otherwise tolerated well. Returned pt to room without fault. Left in bed with personal belongings and call light within reach. All needs met.  Lavanda Pollack Mobility Specialist  Please contact via [employer] or  Rehab Office [phone]

## 2024-02-12 NOTE — Plan of Care (Signed)
  Problem: Coping: Goal: Ability to adjust to condition or change in health will improve 02/12/2024 0229 by Peri Magdalena LABOR, RN Outcome: Progressing 02/12/2024 0229 by Peri Magdalena LABOR, RN Outcome: Progressing   Problem: Nutritional: Goal: Maintenance of adequate nutrition will improve 02/12/2024 0229 by Peri Magdalena LABOR, RN Outcome: Progressing 02/12/2024 0229 by Peri Magdalena LABOR, RN Outcome: Progressing Goal: Progress toward achieving an optimal weight will improve 02/12/2024 0229 by Peri Magdalena LABOR, RN Outcome: Progressing 02/12/2024 0229 by Peri Magdalena LABOR, RN Outcome: Progressing   Problem: Skin Integrity: Goal: Risk for impaired skin integrity will decrease 02/12/2024 0229 by Peri Magdalena LABOR, RN Outcome: Progressing 02/12/2024 0229 by Peri Magdalena LABOR, RN Outcome: Progressing   Problem: Clinical Measurements: Goal: Respiratory complications will improve 02/12/2024 0229 by Peri Magdalena LABOR, RN Outcome: Progressing 02/12/2024 0229 by Peri Magdalena LABOR, RN Outcome: Progressing   Problem: Activity: Goal: Risk for activity intolerance will decrease 02/12/2024 0229 by Peri Magdalena LABOR, RN Outcome: Progressing 02/12/2024 0229 by Peri Magdalena LABOR, RN Outcome: Progressing   Problem: Nutrition: Goal: Adequate nutrition will be maintained 02/12/2024 0229 by Peri Magdalena LABOR, RN Outcome: Progressing 02/12/2024 0229 by Peri Magdalena LABOR, RN Outcome: Progressing   Problem: Coping: Goal: Level of anxiety will decrease 02/12/2024 0229 by Peri Magdalena LABOR, RN Outcome: Progressing 02/12/2024 0229 by Peri Magdalena LABOR, RN Outcome: Progressing   Problem: Elimination: Goal: Will not experience complications related to bowel motility 02/12/2024 0229 by Peri Magdalena LABOR, RN Outcome: Progressing 02/12/2024 0229 by Peri Magdalena LABOR, RN Outcome: Progressing Goal: Will not experience complications related to urinary retention 02/12/2024 0229 by Peri Magdalena LABOR, RN Outcome: Progressing 02/12/2024 0229 by Peri Magdalena LABOR, RN Outcome: Progressing   Problem: Safety: Goal: Ability to remain free from injury will improve 02/12/2024 0229 by Peri Magdalena LABOR, RN Outcome: Progressing 02/12/2024 0229 by Peri Magdalena LABOR, RN Outcome: Progressing

## 2024-02-13 DIAGNOSIS — R0902 Hypoxemia: Secondary | ICD-10-CM

## 2024-02-13 DIAGNOSIS — N12 Tubulo-interstitial nephritis, not specified as acute or chronic: Secondary | ICD-10-CM | POA: Diagnosis not present

## 2024-02-13 DIAGNOSIS — E1165 Type 2 diabetes mellitus with hyperglycemia: Secondary | ICD-10-CM | POA: Diagnosis not present

## 2024-02-13 LAB — CBC
HCT: 36 % (ref 36.0–46.0)
Hemoglobin: 11.6 g/dL — ABNORMAL LOW (ref 12.0–15.0)
MCH: 31 pg (ref 26.0–34.0)
MCHC: 32.2 g/dL (ref 30.0–36.0)
MCV: 96.3 fL (ref 80.0–100.0)
Platelets: 282 K/uL (ref 150–400)
RBC: 3.74 MIL/uL — ABNORMAL LOW (ref 3.87–5.11)
RDW: 11.9 % (ref 11.5–15.5)
WBC: 10.3 K/uL (ref 4.0–10.5)
nRBC: 0 % (ref 0.0–0.2)

## 2024-02-13 LAB — BASIC METABOLIC PANEL WITH GFR
Anion gap: 6 (ref 5–15)
BUN: 22 mg/dL — ABNORMAL HIGH (ref 6–20)
CO2: 29 mmol/L (ref 22–32)
Calcium: 9.3 mg/dL (ref 8.9–10.3)
Chloride: 100 mmol/L (ref 98–111)
Creatinine, Ser: 0.5 mg/dL (ref 0.44–1.00)
GFR, Estimated: >60 mL/min (ref >60)
Glucose, Bld: 276 mg/dL — ABNORMAL HIGH (ref 70–99)
Potassium: 4 mmol/L (ref 3.5–5.1)
Sodium: 135 mmol/L (ref 135–145)

## 2024-02-13 LAB — MAGNESIUM: Magnesium: 1.5 mg/dL — ABNORMAL LOW (ref 1.7–2.4)

## 2024-02-13 LAB — GLUCOSE, CAPILLARY
Glucose-Capillary: 244 mg/dL — ABNORMAL HIGH (ref 70–99)
Glucose-Capillary: 167 mg/dL — ABNORMAL HIGH (ref 70–99)
Glucose-Capillary: 180 mg/dL — ABNORMAL HIGH (ref 70–99)
Glucose-Capillary: 265 mg/dL — ABNORMAL HIGH (ref 70–99)

## 2024-02-13 MED ORDER — FUROSEMIDE 40 MG PO TABS
40.0000 mg | ORAL_TABLET | Freq: Every day | ORAL | Status: DC
  Administered 2024-02-14 – 2024-02-16 (×3): 40 mg via ORAL
  Filled 2024-02-13 (×3): qty 1

## 2024-02-13 MED ORDER — ALBUTEROL SULFATE (2.5 MG/3ML) 0.083% IN NEBU
2.5000 mg | INHALATION_SOLUTION | Freq: Four times a day (QID) | RESPIRATORY_TRACT | Status: DC
  Administered 2024-02-13 – 2024-02-16 (×10): 2.5 mg via RESPIRATORY_TRACT
  Filled 2024-02-13 (×9): qty 3

## 2024-02-13 MED ORDER — ALBUTEROL SULFATE (2.5 MG/3ML) 0.083% IN NEBU
INHALATION_SOLUTION | RESPIRATORY_TRACT | Status: AC
  Administered 2024-02-13: 2.5 mg via RESPIRATORY_TRACT
  Filled 2024-02-13: qty 3

## 2024-02-13 MED ORDER — MAGNESIUM SULFATE 2 GM/50ML IV SOLN
2.0000 g | Freq: Once | INTRAVENOUS | Status: AC
  Administered 2024-02-13: 2 g via INTRAVENOUS
  Filled 2024-02-13: qty 50

## 2024-02-13 MED ORDER — ALBUTEROL SULFATE (2.5 MG/3ML) 0.083% IN NEBU
2.5000 mg | INHALATION_SOLUTION | Freq: Four times a day (QID) | RESPIRATORY_TRACT | Status: DC
  Filled 2024-02-13: qty 3

## 2024-02-13 MED ORDER — INSULIN GLARGINE 100 UNIT/ML ~~LOC~~ SOLN
35.0000 [IU] | Freq: Two times a day (BID) | SUBCUTANEOUS | Status: DC
  Administered 2024-02-13 – 2024-02-15 (×5): 35 [IU] via SUBCUTANEOUS
  Filled 2024-02-13 (×8): qty 0.35

## 2024-02-13 MED ORDER — ALBUTEROL SULFATE (2.5 MG/3ML) 0.083% IN NEBU: 2.5000 mg | INHALATION_SOLUTION | Freq: Four times a day (QID) | RESPIRATORY_TRACT | Status: DC

## 2024-02-13 NOTE — Assessment & Plan Note (Addendum)
 Alternates from needing oxygen to RA. Currently on RA. - SPO2 resting 98%, walking 88%, standing 91% - echo EF improved (60-65%) since 10/2023 - Encouraged incentive spirometry - Wean off O2 as tolerated

## 2024-02-13 NOTE — Progress Notes (Signed)
 Occupational Therapy Treatment Patient Details Name: Nancy [name] MRN: [id number] DOB: [birth year] Today's Date: 02/13/2024   History of present illness 58 y.o. female [facility]24[facility] with multiple falls and hypotension. On CIWA protocol with sepsis 2/2 pyelonephritis. PMH of T2DM, GERD, alcohol use disorder   OT comments  Pt progressing toward goals this session, overall needing supervision-min A for ADLs, supervision for bed mobility and supervision for transfers with RW. Pt eager to mobilize and needs x1 standing rest break. Pt presenting with impairments listed below, will follow acutely. Patient will benefit from continued inpatient follow up therapy, <3 hours/day to maximize safety/ind with ADL/functional mobility.       If plan is discharge home, recommend the following:  A little help with walking and/or transfers;A little help with bathing/dressing/bathroom;Assistance with cooking/housework;Assist for transportation;Help with stairs or ramp for entrance   Equipment Recommendations  Other (comment) (defer)    Recommendations for Other Services      Precautions / Restrictions Precautions Precautions: Fall;Other (comment) Recall of Precautions/Restrictions: Intact Precaution/Restrictions Comments: watch BP Restrictions Weight Bearing Restrictions Per Provider Order: No       Mobility Bed Mobility Overal bed mobility: Needs Assistance Bed Mobility: Supine to Sit, Sit to Supine     Supine to sit: Supervision Sit to supine: Supervision   General bed mobility comments: with increased time    Transfers Overall transfer level: Needs assistance Equipment used: Rolling walker (2 wheels) Transfers: Sit to/from Stand, Bed to chair/wheelchair/BSC Sit to Stand: Supervision                 Balance Overall balance assessment: Mild deficits observed, not formally tested, History of Falls Sitting-balance support: No upper extremity supported, Feet  supported Sitting balance-Leahy Scale: Good     Standing balance support: Bilateral upper extremity supported, During functional activity, Reliant on assistive device for balance Standing balance-Leahy Scale: Fair Standing balance comment: static standing without AD                           ADL either performed or assessed with clinical judgement   ADL Overall ADL's : Needs assistance/impaired                     Lower Body Dressing: Minimal assistance Lower Body Dressing Details (indicate cue type and reason): donning shoes Toilet Transfer: Supervision/safety;Ambulation;Rolling walker (2 wheels)           Functional mobility during ADLs: Supervision/safety;Rolling walker (2 wheels)      Extremity/Trunk Assessment Upper Extremity Assessment Upper Extremity Assessment: Right hand dominant;Generalized weakness   Lower Extremity Assessment Lower Extremity Assessment: Defer to PT evaluation        Vision   Vision Assessment?: No apparent visual deficits   Perception     Praxis     Communication Communication Communication: No apparent difficulties   Cognition Arousal: Alert Behavior During Therapy: WFL for tasks assessed/performed, Impulsive Cognition: No family/caregiver present to determine baseline, No apparent impairments                               Following commands: Intact        Cueing   Cueing Techniques: Verbal cues  Exercises      Shoulder Instructions       General Comments VSS On RA    Pertinent Vitals/ Pain       Pain  Assessment Pain Assessment: Faces Pain Score: 5  Faces Pain Scale: Hurts even more Pain Location: bil hips Pain Descriptors / Indicators: Discomfort, Constant, Grimacing, Burning Pain Intervention(s): Limited activity within patient's tolerance, Monitored during session, Repositioned  Home Living                                          Prior  Functioning/Environment              Frequency  Min 2X/week        Progress Toward Goals  OT Goals(current goals can now be found in the care plan section)  Progress towards OT goals: Progressing toward goals  Acute Rehab OT Goals Patient Stated Goal: none stated OT Goal Formulation: With patient Time For Goal Achievement: 02/15/24 Potential to Achieve Goals: Good ADL Goals Pt Will Perform Grooming: with modified independence;standing Pt Will Perform Lower Body Bathing: with supervision;sitting/lateral leans;sit to/from stand;with adaptive equipment Pt Will Perform Lower Body Dressing: with supervision;with adaptive equipment;sitting/lateral leans;sit to/from stand Pt Will Transfer to Toilet: with modified independence;ambulating;regular height toilet Pt Will Perform Toileting - Clothing Manipulation and hygiene: with modified independence;sit to/from stand;sitting/lateral leans Pt/caregiver will Perform Home Exercise Program: Increased strength;Both right and left upper extremity;With theraband;With theraputty;Independently;With written HEP provided  Plan      Co-evaluation                 AM-PAC OT 6 Clicks Daily Activity     Outcome Measure   Help from another person eating meals?: None Help from another person taking care of personal grooming?: A Little Help from another person toileting, which includes using toliet, bedpan, or urinal?: A Little Help from another person bathing (including washing, rinsing, drying)?: A Little Help from another person to put on and taking off regular upper body clothing?: A Little Help from another person to put on and taking off regular lower body clothing?: A Little 6 Click Score: 19    End of Session Equipment Utilized During Treatment: Rolling walker (2 wheels);Other (comment);Gait belt (LB AE)  OT Visit Diagnosis: Repeated falls (R29.6);History of falling (Z91.81);Muscle weakness (generalized) (M62.81);Ataxia,  unspecified (R27.0) Pain - part of body: Leg;Hip;Knee;Ankle and joints of foot   Activity Tolerance Patient tolerated treatment well;Patient limited by fatigue   Patient Left in bed;with call bell/phone within reach;with bed alarm set   Nurse Communication Mobility status        Time: 8688-8675 OT Time Calculation (min): 13 min  Charges: OT General Charges $OT Visit: 1 Visit OT Treatments $Therapeutic Activity: 8-22 mins  Nancy [name], OTD, OTR/L SecureChat Preferred Acute Rehab [phone]   Nancy [name] 02/13/2024, 1:37 PM

## 2024-02-13 NOTE — Assessment & Plan Note (Addendum)
 Glucose 77-352 last 24 hours.  - Change Semglee  from 60u to 35u BID for better long term control - Continue Aspart 18 units with meals - Continue resistant SSI - CBG check 4x daily with meals, and at bedtime - Continue gabapentin  to 700 mg TID for persistent neuropathic pain of the RLE

## 2024-02-13 NOTE — Plan of Care (Addendum)
 FMTS Brief Progress Note  S: She has intermittent SOB throughout the day. Currently, improved. Albuterol  neb did help. Prior to this admission she had exertional SOB. But overall this recent SOB in the last several days is worse than previously and new this admission. She smokes a pack a day, but has not been smoking since she was admitted. Some coughing but overall not significant.   O: BP 104/69 (BP Location: Right Arm)   Pulse 92   Temp 97.8 F (36.6 C)   Resp 17   Ht 5' 9 (1.753 m)   Wt 66.4 kg   SpO2 91%   BMI 21.62 kg/m    Resp: Lungs clear at the apices. No breath sounds at the bilateral lung bases. ICS assessed and to 1000. Calves: symmetric in size, no edema  Echo 02/11/24 with EF 60-65%, grade I diastolic dysfunction, no aortic stenosis  Chest XR reviewed from 01/24/24 to 02/10/24 to 02/12/24. There is a very small pleural effusion in the right major fissure that is not present in x-rays from July 2025 and previously. Initial 01/24/24 film shows very very minimal effusion. Worst on 02/10/24. Then improved but not resolved on 02/12/24. No blunting of the costophrenic angles. Atelectasis/scarring at the left lung base.  Age adjusted Ddimer wnl, Wells criteria 0  BNP wnl  A/P: The etiology of her SOB is unclear at this point. DDX includes  Atelectasis. This has been a lengthy hospitalization, and she has poor ICS, diminished breath sounds at the bases, and chest x-ray findings of atelectasis.  CHF. No h/o CHF. Her echo shows grade I diastolic dysfunction, her SOB and pleural effusion are somewhat improved with lasix , but she never had crackles or peripheral edema. I did speak with radiology who reported her major fissure pleural effusions are unlikely to cause symptoms as they are so small (<5 cc); however, the course does correlate with her symptomatology. BNP was not elevated this admission compared to prior.  Asthma exacerbation. H/O asthma. She is somewhat improved with nebulizer;  however, clinically she has not had wheezing this admission.  COPD exacerbation. It is possible she has undiagnosed COPD, given her smoking history and lung hyperexpansion on CXR. She currently does have a cough but no sputum production.   - Continue scheduled albuterol  nebulizers q6h  - lasix  40 mg daily, strict I/Os, daily weights  - Encouraged persistent ICS use  - consider treatment for COPD exacerbation, can discuss on rounds tomorrow  Alena Morrison, Elio, MD 02/13/2024, 4:17 PM PGY-1, [facility] Night Resident  Please page [phone] with questions.

## 2024-02-13 NOTE — Progress Notes (Signed)
 Daily Progress Note Intern Pager: [phone]  Patient name: Nancy [name] Medical record number: [id number] Date of birth: 08/24/1965 Age: 58 y.o. Gender: female  Primary Care Provider: Earvin Johnston PARAS, FNP Consultants: None Code Status: Full  Pt Overview and Major Events to Date:  08/05: Admitted for pyelonephritis 08/11: Completed abx course for pyelonephritis 08/12: Medically stable for discharge pending SNF placement 08/19: Awaiting insurance auth for [facility] 08/22: Approved auth and bed available at [place], but new O2 requirement  Assessment and Plan:  Nancy [name] is a 58yo F  initially admitted for pyelonephritis that is resolved, now with intermittent dyspnea requiring O2 supplementation. Suspect dyspnea secondary to interstitial edema from unclear origin. Assessment & Plan Dyspnea Alternates from needing oxygen to RA. Currently on RA. - SPO2 resting 98%, walking 88%, standing 91% - echo EF improved (60-65%) since 10/2023 - Encouraged incentive spirometry - Wean off O2 as tolerated Hyperglycemia due to diabetes mellitus (HCC) Glucose 77-352 last 24 hours.  - Change Semglee  from 60u to 35u BID for better long term control - Continue Aspart 18 units with meals - Continue resistant SSI - CBG check 4x daily with meals, and at bedtime - Continue gabapentin  to 700 mg TID for persistent neuropathic pain of the RLE Chronic health problem Substance Abuse: TOC consulted, cont nicotine  patch 21 mg daily, Nicotine  4 mg gum PRN. UDS 8/15 negative. Insomnia: cont melatonin, hydroxyzine  T2DM Neuropathy: continue Cymbalta  60 mg daily, gabapentin  700mg  TID,  Pressure ulcer: superficial, present on admission. Wound care management as ordered  OA of Hip/Low back pain: uses crutches at home, cont tylenol , ibuprofen , oxy 5 mg q8h PRN;  Alcohol use disorder: Stable. Last drink 8/5. CIWA protocol d/c'd. Vitamin B 100 mg daily, Folic acid  1 mg daily. Hydroxyzine  25 mg 3 times  daily as needed for anxiety.  FEN/GI: regular diet PPx: Lovenox  Dispo:SNF tomorrow.   Subjective:  Patient was seen and examined at bedside. She was about to eat breakfast and was sitting up comfortably in bed on RA. She is able to do 10 incentive spirometers without difficulty.  Objective: Temp:  [98 F (36.7 C)-98.4 F (36.9 C)] 98 F (36.7 C) (08/24 2042) Pulse Rate:  [87-99] 96 (08/24 2042) Resp:  [16] 16 (08/24 1605) BP: (108-122)/(67-72) 113/67 (08/24 2042) SpO2:  [88 %-91 %] 90 % (08/24 2042) Weight:  [66.4 kg] 66.4 kg (08/25 0500)  Physical Exam: General: A&O, NAD, sitting up comfortably Cardiovascular: RRR, no m/r/g Respiratory: CTAB to upper lobes, tight and diminished breath sounds to lower lobes b/l, good respiratory effort, pulse ox remained between 94-96 on RA during my exam Abdomen: non-tender, soft, non-distended Extremities: no peripheral edema   Laboratory: Most recent CBC Lab Results  Component Value Date   WBC 10.2 02/11/2024   HGB 12.2 02/11/2024   HCT 38.3 02/11/2024   MCV 98.0 02/11/2024   PLT 300 02/11/2024   Most recent BMP    Latest Ref Rng & Units 02/11/2024    7:16 AM  BMP  Glucose 70 - 99 mg/dL 721   BUN 6 - 20 mg/dL 22   Creatinine 9.55 - 1.00 mg/dL 9.57   Sodium 864 - 854 mmol/L 135   Potassium 3.5 - 5.1 mmol/L 4.3   Chloride 98 - 111 mmol/L 100   CO2 22 - 32 mmol/L 30   Calcium  8.9 - 10.3 mg/dL 9.5    Nancy Credit, DO 02/13/2024, 7:15 AM  PGY-1, [facility] FPTS Intern pager:  [phone], text pages welcome Secure chat group [facility] Teaching Service

## 2024-02-13 NOTE — Plan of Care (Signed)
  Problem: Nutritional: Goal: Maintenance of adequate nutrition will improve Outcome: Progressing   Problem: Elimination: Goal: Will not experience complications related to bowel motility Outcome: Progressing   Problem: Pain Managment: Goal: General experience of comfort will improve and/or be controlled Outcome: Progressing   Problem: Skin Integrity: Goal: Risk for impaired skin integrity will decrease Outcome: Progressing

## 2024-02-13 NOTE — Assessment & Plan Note (Addendum)
 Substance Abuse: TOC consulted, cont nicotine  patch 21 mg daily, Nicotine  4 mg gum PRN. UDS 8/15 negative. Insomnia: cont melatonin, hydroxyzine  T2DM Neuropathy: continue Cymbalta  60 mg daily, gabapentin  700mg  TID,  Pressure ulcer: superficial, present on admission. Wound care management as ordered  OA of Hip/Low back pain: uses crutches at home, cont tylenol , ibuprofen , oxy 5 mg q8h PRN;  Alcohol use disorder: Stable. Last drink 8/5. CIWA protocol d/c'd. Vitamin B 100 mg daily, Folic acid  1 mg daily. Hydroxyzine  25 mg 3 times daily as needed for anxiety.

## 2024-02-14 ENCOUNTER — Inpatient Hospital Stay (HOSPITAL_COMMUNITY)

## 2024-02-14 DIAGNOSIS — N12 Tubulo-interstitial nephritis, not specified as acute or chronic: Secondary | ICD-10-CM | POA: Diagnosis not present

## 2024-02-14 DIAGNOSIS — R0600 Dyspnea, unspecified: Secondary | ICD-10-CM | POA: Diagnosis not present

## 2024-02-14 LAB — BASIC METABOLIC PANEL WITH GFR
Anion gap: 9 (ref 5–15)
BUN: 27 mg/dL — ABNORMAL HIGH (ref 6–20)
CO2: 29 mmol/L (ref 22–32)
Calcium: 9 mg/dL (ref 8.9–10.3)
Chloride: 96 mmol/L — ABNORMAL LOW (ref 98–111)
Creatinine, Ser: 0.6 mg/dL (ref 0.44–1.00)
GFR, Estimated: >60 mL/min (ref >60)
Glucose, Bld: 347 mg/dL — ABNORMAL HIGH (ref 70–99)
Potassium: 4.1 mmol/L (ref 3.5–5.1)
Sodium: 134 mmol/L — ABNORMAL LOW (ref 135–145)

## 2024-02-14 LAB — CBC
HCT: 34.6 % — ABNORMAL LOW (ref 36.0–46.0)
Hemoglobin: 11.1 g/dL — ABNORMAL LOW (ref 12.0–15.0)
MCH: 31.4 pg (ref 26.0–34.0)
MCHC: 32.1 g/dL (ref 30.0–36.0)
MCV: 97.7 fL (ref 80.0–100.0)
Platelets: 300 K/uL (ref 150–400)
RBC: 3.54 MIL/uL — ABNORMAL LOW (ref 3.87–5.11)
RDW: 11.9 % (ref 11.5–15.5)
WBC: 10.8 K/uL — ABNORMAL HIGH (ref 4.0–10.5)
nRBC: 0 % (ref 0.0–0.2)

## 2024-02-14 LAB — GLUCOSE, CAPILLARY
Glucose-Capillary: 365 mg/dL — ABNORMAL HIGH (ref 70–99)
Glucose-Capillary: 172 mg/dL — ABNORMAL HIGH (ref 70–99)
Glucose-Capillary: 99 mg/dL (ref 70–99)
Glucose-Capillary: 35 mg/dL — CL (ref 70–99)
Glucose-Capillary: 86 mg/dL (ref 70–99)

## 2024-02-14 LAB — URINALYSIS, ROUTINE W REFLEX MICROSCOPIC
Bilirubin Urine: NEGATIVE
Glucose, UA: 150 mg/dL — AB
Hgb urine dipstick: NEGATIVE
Ketones, ur: NEGATIVE mg/dL
Nitrite: NEGATIVE
Protein, ur: NEGATIVE mg/dL
Specific Gravity, Urine: 1.011 (ref 1.005–1.030)
WBC, UA: >50 WBC/hpf (ref 0–5)
pH: 5 (ref 5.0–8.0)

## 2024-02-14 LAB — MAGNESIUM: Magnesium: 1.6 mg/dL — ABNORMAL LOW (ref 1.7–2.4)

## 2024-02-14 MED ORDER — NICOTINE 14 MG/24HR TD PT24: 14.0000 mg | MEDICATED_PATCH | Freq: Every day | TRANSDERMAL | Status: DC

## 2024-02-14 MED ORDER — GABAPENTIN 300 MG PO CAPS: 600.0000 mg | ORAL_CAPSULE | Freq: Three times a day (TID) | ORAL | Status: DC

## 2024-02-14 MED ORDER — MEDIHONEY WOUND/BURN DRESSING EX PSTE: 1.0000 | PASTE | Freq: Every day | CUTANEOUS | Status: DC

## 2024-02-14 MED ORDER — GERHARDT'S BUTT CREAM: 1.0000 | TOPICAL_CREAM | Freq: Two times a day (BID) | CUTANEOUS | Status: DC

## 2024-02-14 MED ORDER — INSULIN GLARGINE 100 UNIT/ML ~~LOC~~ SOLN: 35.0000 [IU] | Freq: Two times a day (BID) | SUBCUTANEOUS | Status: DC

## 2024-02-14 MED ORDER — LIDOCAINE 5 % EX PTCH: 1.0000 | MEDICATED_PATCH | Freq: Every day | CUTANEOUS | Status: AC

## 2024-02-14 MED ORDER — DULOXETINE HCL 60 MG PO CPEP: 60.0000 mg | ORAL_CAPSULE | Freq: Every day | ORAL | Status: DC

## 2024-02-14 MED ORDER — MAGNESIUM SULFATE 2 GM/50ML IV SOLN
2.0000 g | Freq: Once | INTRAVENOUS | Status: AC
  Administered 2024-02-14: 2 g via INTRAVENOUS
  Filled 2024-02-14: qty 50

## 2024-02-14 MED ORDER — ADULT MULTIVITAMIN W/MINERALS CH
1.0000 | ORAL_TABLET | Freq: Every day | ORAL | Status: AC
Start: 2024-02-14 — End: ?

## 2024-02-14 MED ORDER — INSULIN ASPART 100 UNIT/ML IJ SOLN: 18.0000 [IU] | Freq: Three times a day (TID) | INTRAMUSCULAR | Status: DC

## 2024-02-14 MED ORDER — METHOCARBAMOL 750 MG PO TABS: 750.0000 mg | ORAL_TABLET | Freq: Four times a day (QID) | ORAL | Status: DC | PRN

## 2024-02-14 MED ORDER — ACETAMINOPHEN 500 MG PO TABS: 1000.0000 mg | ORAL_TABLET | Freq: Four times a day (QID) | ORAL | Status: AC | PRN

## 2024-02-14 MED ORDER — VITAMIN B-1 100 MG PO TABS: 100.0000 mg | ORAL_TABLET | Freq: Every day | ORAL | Status: AC

## 2024-02-14 MED ORDER — INSULIN GLARGINE 100 UNIT/ML SOLOSTAR PEN
55.0000 [IU] | PEN_INJECTOR | Freq: Every day | SUBCUTANEOUS | Status: DC
Start: 2024-02-14 — End: 2024-03-19

## 2024-02-14 MED ORDER — HYDROXYZINE HCL 25 MG PO TABS: 25.0000 mg | ORAL_TABLET | Freq: Three times a day (TID) | ORAL | Status: DC | PRN

## 2024-02-14 MED ORDER — FLUCONAZOLE 150 MG PO TABS
150.0000 mg | ORAL_TABLET | Freq: Once | ORAL | Status: AC
  Administered 2024-02-14: 150 mg via ORAL
  Filled 2024-02-14: qty 1

## 2024-02-14 NOTE — Plan of Care (Signed)
  Problem: Education: Goal: Ability to describe self-care measures that may prevent or decrease complications (Diabetes Survival Skills Education) will improve Outcome: Progressing   Problem: Health Behavior/Discharge Planning: Goal: Ability to manage health-related needs will improve Outcome: Progressing   Problem: Metabolic: Goal: Ability to maintain appropriate glucose levels will improve Outcome: Progressing   Problem: Nutritional: Goal: Progress toward achieving an optimal weight will improve Outcome: Progressing   Problem: Pain Managment: Goal: General experience of comfort will improve and/or be controlled Outcome: Progressing   Problem: Safety: Goal: Ability to remain free from injury will improve Outcome: Progressing

## 2024-02-14 NOTE — Assessment & Plan Note (Signed)
 Substance Abuse: TOC consulted, cont nicotine  patch 21 mg daily, Nicotine  4 mg gum PRN. UDS 8/15 negative. Insomnia: cont melatonin, hydroxyzine  T2DM Neuropathy: continue Cymbalta  60 mg daily, gabapentin  700mg  TID,  Pressure ulcer: superficial, present on admission. Wound care management as ordered  OA of Hip/Low back pain: uses crutches at home, cont tylenol , ibuprofen , oxy 5 mg q8h PRN;  Alcohol use disorder: Stable. Last drink 8/5. CIWA protocol d/c'd. Vitamin B 100 mg daily, Folic acid  1 mg daily. Hydroxyzine  25 mg 3 times daily as needed for anxiety.

## 2024-02-14 NOTE — Progress Notes (Signed)
 SATURATION QUALIFICATIONS: (This note is used to comply with regulatory documentation for home oxygen)  Patient Saturations on Room Air at Rest = 89-91%  Patient Saturations on Room Air while Ambulating = 93-98%  Please briefly explain why patient needs home oxygen: N/A Pt maintained safe saturation level throughout ambulation.   Nancy [name]. PTA Acute Rehabilitation Services Office: [phone]

## 2024-02-14 NOTE — Progress Notes (Signed)
 Blood sugar is 360+ but was take post meal. Camie Dixons MD was notified and ordered to give 20units for sliding scale Novolog  and 18units standard. Pt was informed to notify nursing staff to check blood sugar before meal. Pt remains asymptomatic, pt was informed to notify RN if pt has symptoms of hypoglycemia.

## 2024-02-14 NOTE — Assessment & Plan Note (Addendum)
 Glucose 77-352 last 24 hours.  - Continue Semglee  from 35u BID for better long term control - Continue Aspart 18 units with meals - Continue resistant SSI - CBG check 4x daily with meals, and at bedtime - Continue gabapentin  to 700 mg TID for persistent neuropathic pain of the RLE

## 2024-02-14 NOTE — Progress Notes (Signed)
 Patient had low CBG to 35.Hypoglycemic protocol implemented .CBG  rechecked after 30 minutes.Went up to 86.

## 2024-02-14 NOTE — Progress Notes (Addendum)
 Physical Therapy Treatment Patient Details Name: Nancy [name] MRN: [id number] DOB: 04/22/1966 Today's Date: 02/14/2024   History of Present Illness 58 y.o. female presents to [facility] 01/24/24 with multiple falls and hypotension. On CIWA protocol with sepsis 2/2 pyelonephritis. PMH of T2DM, GERD, alcohol use disorder    PT Comments  Pt pleasant and agreeable to session with slow but steady progress as pt with increased fatigue this session. Trialed rollator support during ambulation however pt endorsing decreased feelings of stability and requesting RW. Pt demonstrating gait with RW for support with grossly CGA for safety with x2 standing rest breaks due to fatigue and bil hand pain due to heavy UE use on RW to offload painful Les. Pt maintaining SpO2 93-98% on RA throughout activity. Pt requiring min A to return Les to bed at end of session. Pt continues to benefit from skilled PT services to progress toward functional mobility goals.     If plan is discharge home, recommend the following: A little help with walking and/or transfers;A little help with bathing/dressing/bathroom;Assist for transportation;Help with stairs or ramp for entrance   Can travel by private vehicle     Yes  Equipment Recommendations  None recommended by PT    Recommendations for Other Services       Precautions / Restrictions Precautions Precautions: Fall;Other (comment) Recall of Precautions/Restrictions: Intact Precaution/Restrictions Comments: watch BP Restrictions Weight Bearing Restrictions Per Provider Order: No     Mobility  Bed Mobility Overal bed mobility: Needs Assistance Bed Mobility: Sit to Supine       Sit to supine: Min assist   General bed mobility comments: min A to being BLE into bed    Transfers Overall transfer level: Needs assistance Equipment used: Rolling walker (2 wheels), Rollator (4 wheels) Transfers: Sit to/from Stand, Bed to chair/wheelchair/BSC Sit to Stand: Supervision            General transfer comment: close Supervision for safety, good recall for hand placement, increased time to come to standing with bil knees remaining flexed    Ambulation/Gait Ambulation/Gait assistance: Contact guard assist, Min assist Gait Distance (Feet): 15 Feet (+ 150') Assistive device: Rolling walker (2 wheels), Rollator (4 wheels) Gait Pattern/deviations: Step-to pattern, Decreased stride length, Decreased dorsiflexion - right, Decreased dorsiflexion - left, Knee flexed in stance - right, Knee flexed in stance - left, Antalgic Gait velocity: decr     General Gait Details: pt ambulating short in room distance with rollator support with min A to maintain balance with pt stating she did not like rollator,  pt taking RW and continuing ambulation without LOB, x2 standing rest breaks due to fatigue   Stairs             Wheelchair Mobility     Tilt Bed    Modified Rankin (Stroke Patients Only)       Balance Overall balance assessment: Mild deficits observed, not formally tested, History of Falls Sitting-balance support: No upper extremity supported, Feet supported Sitting balance-Leahy Scale: Good     Standing balance support: Bilateral upper extremity supported, During functional activity, Reliant on assistive device for balance Standing balance-Leahy Scale: Fair Standing balance comment: static standing without AD                            Communication Communication Communication: No apparent difficulties  Cognition Arousal: Alert Behavior During Therapy: WFL for tasks assessed/performed, Impulsive   PT - Cognitive impairments: No apparent impairments  Following commands: Intact      Cueing Cueing Techniques: Verbal cues  Exercises      General Comments General comments (skin integrity, edema, etc.): SpO2 89-91% on RA at rest, 93-98% on RA with ambulation      Pertinent Vitals/Pain Pain  Assessment Pain Assessment: Faces Faces Pain Scale: Hurts a little bit Pain Location: bil hips Pain Descriptors / Indicators: Discomfort, Constant, Grimacing, Burning Pain Intervention(s): Monitored during session, Limited activity within patient's tolerance    Home Living                          Prior Function            PT Goals (current goals can now be found in the care plan section) Acute Rehab PT Goals PT Goal Formulation: With patient Time For Goal Achievement: 02/08/24 Progress towards PT goals: Progressing toward goals    Frequency    Min 2X/week      PT Plan      Co-evaluation              AM-PAC PT 6 Clicks Mobility   Outcome Measure  Help needed turning from your back to your side while in a flat bed without using bedrails?: A Little Help needed moving from lying on your back to sitting on the side of a flat bed without using bedrails?: A Little Help needed moving to and from a bed to a chair (including a wheelchair)?: A Little Help needed standing up from a chair using your arms (e.g., wheelchair or bedside chair)?: A Little Help needed to walk in hospital room?: A Little Help needed climbing 3-5 steps with a railing? : A Lot 6 Click Score: 17    End of Session   Activity Tolerance: Patient tolerated treatment well;Patient limited by fatigue;Patient limited by pain Patient left: in bed;with call bell/phone within reach Nurse Communication: Mobility status;Other (comment) (OS sats) PT Visit Diagnosis: Other abnormalities of gait and mobility (R26.89);Muscle weakness (generalized) (M62.81);History of falling (Z91.81)     Time: 8955-8890 PT Time Calculation (min) (ACUTE ONLY): 25 min  Charges:    $Gait Training: 23-37 mins PT General Charges $$ ACUTE PT VISIT: 1 Visit                     Daiel Strohecker R. PTA Acute Rehabilitation Services Office: [phone]   [name] 02/14/2024, 12:53 PM

## 2024-02-14 NOTE — Progress Notes (Signed)
 Mobility Specialist Progress Note:    02/14/24 1337  Mobility  Activity Ambulated with assistance (In hallway)  Level of Assistance Standby assist, set-up cues, supervision of patient - no hands on  Assistive Device Front wheel walker  Distance Ambulated (ft) 80 ft  Activity Response Tolerated well  Mobility Referral Yes  Mobility visit 1 Mobility  Mobility Specialist Start Time (ACUTE ONLY) 1308  Mobility Specialist Stop Time (ACUTE ONLY) 1318  Mobility Specialist Time Calculation (min) (ACUTE ONLY) 10 min   Received pt in bed and agreeable to mobility. No physical assistance needed. Further mobility deferred d/t pt c/o fatigue. Returned to room without fault. Left in bed with personal belongings and call light within reach. All needs met.  Lavanda Pollack Mobility Specialist  Please contact via [employer] or  Rehab Office [phone]

## 2024-02-14 NOTE — Assessment & Plan Note (Deleted)
 Glucose 77-352 last 24 hours.  - Change Semglee  from 60u to 35u BID for better long term control - Continue Aspart 18 units with meals - Continue resistant SSI - CBG check 4x daily with meals, and at bedtime - Continue gabapentin  to 700 mg TID for persistent neuropathic pain of the RLE

## 2024-02-14 NOTE — Progress Notes (Signed)
 Daily Progress Note Intern Pager: [phone]  Patient name: Nancy [name] Medical record number: [id number] Date of birth: 04-28-1966 Age: 58 y.o. Gender: female  Primary Care Provider: Earvin Johnston PARAS, FNP Consultants: None Code Status: Full  Pt Overview and Major Events to Date:  08/05: Admitted for pyelonephritis 08/11: Completed abx course for pyelonephritis 08/12: Medically stable for discharge pending SNF placement 08/19: Awaiting insurance auth for [facility] 08/22: Approved auth and bed available at [place], but new O2 requirement 08/22-08/24: Workup for new intermittent O2 requirement; indicative of ?new onset CHF vs asthma exacerbation 08/26: Medically stable for discharge without needing O2 requirement  Assessment and Plan:  Nancy [name] is a 58yo F initially admitted for pyelonephritis that is resolved, now with intermittent dyspnea requiring O2 supplementation. Suspect dyspnea secondary to interstitial edema from unclear origin after workup.  Assessment & Plan Dyspnea Doesn't feel SOB but reports malaise today. O2 sats 91-92% RA, not requiring oxygen.  - 08/26 walk test: on RA SpO2 89-91% at rest, 92-98% during activity; not requiring oxygen - echo EF improved (60-65%) since 10/2023 - Encouraged incentive spirometry - Encouraged PT/OT - Urgent CXR and UA ordered for infection r/o prior to d/c Uncontrolled type 2 diabetes mellitus with hyperglycemia (HCC) Glucose 77-352 last 24 hours.  - Continue Semglee  from 35u BID for better long term control - Continue Aspart 18 units with meals - Continue resistant SSI - CBG check 4x daily with meals, and at bedtime - Continue gabapentin  to 700 mg TID for persistent neuropathic pain of the RLE Chronic health problem Substance Abuse: TOC consulted, cont nicotine  patch 21 mg daily, Nicotine  4 mg gum PRN. UDS 8/15 negative. Insomnia: cont melatonin, hydroxyzine  T2DM Neuropathy: continue Cymbalta  60 mg daily,  gabapentin  700mg  TID,  Pressure ulcer: superficial, present on admission. Wound care management as ordered  OA of Hip/Low back pain: uses crutches at home, cont tylenol , ibuprofen , oxy 5 mg q8h PRN;  Alcohol use disorder: Stable. Last drink 8/5. CIWA protocol d/c'd. Vitamin B 100 mg daily, Folic acid  1 mg daily. Hydroxyzine  25 mg 3 times daily as needed for anxiety.  FEN/GI: regular diet PPx: Lovenox  Dispo:SNF today.   Subjective:  Patient was seen and examined at bedside. She reports overall malaise but no other complaints.   Objective: Temp:  [97.7 F (36.5 C)-98.2 F (36.8 C)] 98.2 F (36.8 C) (08/26 0415) Pulse Rate:  [92-98] 93 (08/26 0415) Resp:  [17] 17 (08/26 0415) BP: (100-114)/(62-73) 107/70 (08/26 0415) SpO2:  [90 %-92 %] 92 % (08/26 0415)  Physical Exam: General: A&O, NAD, sitting up comfortably in bed  Cardiovascular: RRR, no m/r/g Respiratory: good air movement throughout all lung fields with good respiratory effort, mild crackles to b/l lower lung bases, no wheezes  Abdomen: soft, non-tender, non-distended Extremities: no peripheral edema  Laboratory: Most recent CBC Lab Results  Component Value Date   WBC 10.8 (H) 02/14/2024   HGB 11.1 (L) 02/14/2024   HCT 34.6 (L) 02/14/2024   MCV 97.7 02/14/2024   PLT 300 02/14/2024   Most recent BMP    Latest Ref Rng & Units 02/14/2024    2:48 AM  BMP  Glucose 70 - 99 mg/dL 652   BUN 6 - 20 mg/dL 27   Creatinine 9.55 - 1.00 mg/dL 9.39   Sodium 864 - 854 mmol/L 134   Potassium 3.5 - 5.1 mmol/L 4.1   Chloride 98 - 111 mmol/L 96   CO2 22 - 32 mmol/L 29  Calcium  8.9 - 10.3 mg/dL 9.0    Lupie Credit, DO 02/14/2024, 7:02 AM  PGY-1, [facility] FPTS Intern pager: [phone], text pages welcome Secure chat group [facility] Teaching Service

## 2024-02-14 NOTE — Assessment & Plan Note (Addendum)
 Doesn't feel SOB but reports malaise today. O2 sats 91-92% RA, not requiring oxygen.  - 08/26 walk test: on RA SpO2 89-91% at rest, 92-98% during activity; not requiring oxygen - echo EF improved (60-65%) since 10/2023 - Encouraged incentive spirometry - Encouraged PT/OT - Urgent CXR and UA ordered for infection r/o prior to d/c

## 2024-02-15 DIAGNOSIS — N12 Tubulo-interstitial nephritis, not specified as acute or chronic: Secondary | ICD-10-CM | POA: Diagnosis not present

## 2024-02-15 DIAGNOSIS — N390 Urinary tract infection, site not specified: Secondary | ICD-10-CM | POA: Insufficient documentation

## 2024-02-15 DIAGNOSIS — R0902 Hypoxemia: Secondary | ICD-10-CM | POA: Diagnosis not present

## 2024-02-15 LAB — CBC WITH DIFFERENTIAL/PLATELET
Abs Immature Granulocytes: 0.05 K/uL (ref 0.00–0.07)
Basophils Absolute: 0.1 K/uL (ref 0.0–0.1)
Basophils Relative: 1 %
Eosinophils Absolute: 0.1 K/uL (ref 0.0–0.5)
Eosinophils Relative: 1 %
HCT: 33.9 % — ABNORMAL LOW (ref 36.0–46.0)
Hemoglobin: 10.9 g/dL — ABNORMAL LOW (ref 12.0–15.0)
Immature Granulocytes: 0 %
Lymphocytes Relative: 19 %
Lymphs Abs: 2.3 K/uL (ref 0.7–4.0)
MCH: 31.1 pg (ref 26.0–34.0)
MCHC: 32.2 g/dL (ref 30.0–36.0)
MCV: 96.6 fL (ref 80.0–100.0)
Monocytes Absolute: 1 K/uL (ref 0.1–1.0)
Monocytes Relative: 9 %
Neutro Abs: 8.4 K/uL — ABNORMAL HIGH (ref 1.7–7.7)
Neutrophils Relative %: 70 %
Platelets: 290 K/uL (ref 150–400)
RBC: 3.51 MIL/uL — ABNORMAL LOW (ref 3.87–5.11)
RDW: 11.9 % (ref 11.5–15.5)
WBC: 11.9 K/uL — ABNORMAL HIGH (ref 4.0–10.5)
nRBC: 0 % (ref 0.0–0.2)

## 2024-02-15 LAB — GLUCOSE, CAPILLARY
Glucose-Capillary: 126 mg/dL — ABNORMAL HIGH (ref 70–99)
Glucose-Capillary: 185 mg/dL — ABNORMAL HIGH (ref 70–99)
Glucose-Capillary: 328 mg/dL — ABNORMAL HIGH (ref 70–99)
Glucose-Capillary: 411 mg/dL — ABNORMAL HIGH (ref 70–99)
Glucose-Capillary: 430 mg/dL — ABNORMAL HIGH (ref 70–99)
Glucose-Capillary: 514 mg/dL (ref 70–99)
Glucose-Capillary: 86 mg/dL (ref 70–99)

## 2024-02-15 MED ORDER — INSULIN ASPART 100 UNIT/ML IJ SOLN
10.0000 [IU] | Freq: Once | INTRAMUSCULAR | Status: AC
  Administered 2024-02-15: 10 [IU] via SUBCUTANEOUS

## 2024-02-15 MED ORDER — CEFADROXIL 500 MG PO CAPS
1000.0000 mg | ORAL_CAPSULE | Freq: Two times a day (BID) | ORAL | Status: DC
  Administered 2024-02-15 – 2024-02-16 (×3): 1000 mg via ORAL
  Filled 2024-02-15 (×4): qty 2

## 2024-02-15 MED ORDER — INSULIN ASPART 100 UNIT/ML IJ SOLN
10.0000 [IU] | Freq: Three times a day (TID) | INTRAMUSCULAR | Status: DC
  Administered 2024-02-15 – 2024-02-16 (×2): 10 [IU] via SUBCUTANEOUS

## 2024-02-15 NOTE — Inpatient Diabetes Management (Signed)
 Inpatient Diabetes Program Recommendations  AACE/ADA: New Consensus Statement on Inpatient Glycemic Control (2015)  Target Ranges:  Prepandial:   less than 140 mg/dL      Peak postprandial:   less than 180 mg/dL (1-2 hours)      Critically ill patients:  140 - 180 mg/dL   Lab Results  Component Value Date   GLUCAP 328 (H) 02/15/2024   HGBA1C 14.3 (H) 01/24/2024    Latest Reference Range & Units 02/14/24 12:01 02/14/24 16:35 02/14/24 20:20 02/14/24 20:59 02/15/24 00:16 02/15/24 02:39 02/15/24 04:03 02/15/24 08:16  Glucose-Capillary 70 - 99 mg/dL 827 (H) 99 35 (LL) 86 588 (H) 514 (HH) 430 (H) 328 (H)  (LL): Data is critically low (HH): Data is critically high (H): Data is abnormally high   Diabetes history: DM2 Outpatient Diabetes medications: Lantus  20 units daily, Novolog  0-18 units QID, Amaryl  2 mg daily, Metformin  1000 mg BID Current orders for Inpatient glycemic control: Lantus  35 units bid, Novolog  18 units TID with meals, Novolog  0-20 units TID with meals, Novolog  0-5 units QHS  Inpatient Diabetes Program Recommendations:   Spoke with pt. @ bedside. Patient thinks she did not eat 50% meal yesterday prior to her CBG decreased to 35 and overtreated with carbs so CBG then increased to 514. Reviewed with patient to let her nurse know if she was not planning to eat @ least 50% food. Diet was also changed to carb modified.  Thank you, Carsten Carstarphen E. Sharmel Ballantine, RN, MSN, CNS, CDCES  Diabetes Coordinator Inpatient Glycemic Control Team Team Pager [phone] (8am-5pm) 02/15/2024 10:39 AM

## 2024-02-15 NOTE — Assessment & Plan Note (Addendum)
 Glucose dip to 35 overnight, back on carb modified and decrease meal time insulin  to 10u. - Continue Semglee  from 35u BID for better long term control - Continue Aspart 10 units with meals - Continue resistant SSI - CBG check 4x daily with meals, and at bedtime - Continue gabapentin  to 700 mg TID for persistent neuropathic pain of the RLE - Strict carb control diet, discussed at length with patient who is agreeable

## 2024-02-15 NOTE — Progress Notes (Signed)
 Nutrition Follow-up  DOCUMENTATION CODES:   Severe malnutrition in context of social or environmental circumstances  INTERVENTION:  Encourage PO intake- Now on carb modified diet Emphasis on protein with every meal and avoidance of sugar sweetened beverages Transition to Carb modified due to elevated CBGs Glucerna Shake po TID, each supplement provides 220 kcal and 10 grams of protein 1 packet Juven BID, each packet provides 95 calories, 2.5 grams of protein (collagen), to support wound healing Continue MVI, Folic acid , and Vit C  Provided DM handouts in AVS  Recommend outpatient DM education   NUTRITION DIAGNOSIS:   Severe Malnutrition related to chronic illness, social / environmental circumstances as evidenced by percent weight loss, energy intake < 75% for > or equal to 3 months, severe muscle depletion, moderate fat depletion. - Ongoing   GOAL:   Patient will meet greater than or equal to 90% of their needs - Meeting via PO intake   MONITOR:   PO intake, Weight trends, Supplement acceptance, Labs, Skin  REASON FOR ASSESSMENT:   Consult Assessment of nutrition requirement/status, Diet education, Wound healing  ASSESSMENT:   Hx DM II, tobacco use, alcohol use, depression, GERD, chronic bilateral hip pain, frequent falls. Pt's roommate called EMS because they had been drinking all night and patient had had multiple falls. Last 3 weeks she has been having diarrhea more than 10 times a day per pt. She has also had some intermittent vomiting. She is complaining of right lower quadrant pain as well that has been associated with the diarrhea.  She cannot remember the last time she had anything to eat or drink. She states she feels like she is going through withdrawal but did admit to drinking last night.  She also reports she has been having a lot of migraine headaches.   Pt ordering lunch on visit, frustrated she I snow on a carb modified diet. Explained what a carb modified diet  looks like and encouraged pt to avoid sugar sweetened beverages and desserts. Encourgaed protein intake for wound healing.  PT with good PO intake since admission, eating 75-100% of her meals and drinking 2 Glucernas and 2 Juven per day to help with increased energy needs due to wounds and severe malnutrition diagnosis. RD previously changed diet to Carb modified for elevated CBG however this was changed back to regular. Will change back to Carb modified as CBGs have been ranging from 86-514 mg/dL  RD will sign off at this time as pt with good PO intake and following nutritional interventions. Please re consult RD if needed.   Disposition: SNF, no offers yet   Admit weight: 75 kg - suspect stated Current weight: 68.5 kg   Average Meal Intake: 8/24-8/26: 75-100% intake x 8 recorded meals  Nutritionally Relevant Medications: Scheduled Meds:  ascorbic acid   500 mg Oral Daily   feeding supplement (GLUCERNA SHAKE)  237 mL Oral TID BM   folic acid   1 mg Oral Daily   furosemide   40 mg Oral Daily   insulin  aspart  0-20 Units Subcutaneous TID WC   insulin  aspart  0-5 Units Subcutaneous QHS   insulin  aspart  18 Units Subcutaneous TID WC   insulin  glargine  35 Units Subcutaneous BID   multivitamin with minerals  1 tablet Oral Daily   nutrition supplement (JUVEN)  1 packet Oral BID BM   Labs Reviewed: Sodium 134 BUN 27 Magnesium  1.6  CBG ranges from 86-514 mg/dL over the last 24 hours HgbA1c 14.3  Diet Order:  Diet Order             Diet Carb Modified Fluid consistency: Thin; Room service appropriate? Yes  Diet effective now                   EDUCATION NEEDS:   Education needs have been addressed  Skin:  Skin Assessment: Skin Integrity Issues: Skin Integrity Issues:: Stage II Stage II: R Buttocks  Last BM:  01/31/2024, type 5 x 2  Height:   Ht Readings from Last 1 Encounters:  01/24/24 5' 9 (1.753 m)    Weight:   Wt Readings from Last 1 Encounters:  02/15/24 68.5  kg    BMI:  Body mass index is 22.3 kg/m.  Estimated Nutritional Needs:   Kcal:  1875-2250 kcal  Protein:  90-115 g  Fluid:  >/=2L   Nancy [name], RD Registered Dietitian  See Amion for more information

## 2024-02-15 NOTE — Progress Notes (Signed)
 Daily Progress Note Intern Pager: [phone]  Patient name: Nancy [name] Medical record number: [id number] Date of birth: April 09, 1966 Age: 58 y.o. Gender: female  Primary Care Provider: Earvin Johnston PARAS, FNP Consultants: none Code Status: Full  Pt Overview and Major Events to Date:  08/05: Admitted for pyelonephritis 08/11: Completed abx course for pyelonephritis 08/12: Medically stable for discharge pending SNF placement 08/19: Awaiting insurance auth for [facility] 08/22: Approved auth and bed available at [place], but new O2 requirement 08/22-08/24: Workup for new intermittent O2 requirement; indicative of ?new onset CHF vs asthma exacerbation 08/26: Medically stable for discharge without needing O2 requirement 08/27: UTI treatment initiated with 5 day course of Cefadroxil   Assessment and Plan: Nancy [name] is a 58yo F initially admitted for pyelonephritis that is resolved, now with intermittent dyspnea requiring O2 supplementation at times.. Suspect dyspnea secondary to interstitial edema from unclear origin after workup. Now with UTI symptoms and positive leukocytes on UA.  Assessment & Plan UTI (urinary tract infection) +Leukocytes on UA with associated dysuria, urinary retention. No vaginal itching, bleeding, or d/c. Most likely source of leukocytosis on CBC. - Begin Cefadroxil  1g BID x5 days 08/27-08/31. Today is Day 1/5.  - Urine cultures pending Dyspnea Doesn't feel SOB but reports general malaise today, chills overnight but no fever. O2 sats 91-92% RA, she was placed back on 2L [place] early this AM. Reports dysuria, urinary retention where she needs to stand to empty her bladder completely, no vaginal itching, bleeding or discharge.  - 08/26 walk test: on RA SpO2 89-91% at rest, 92-98% during activity; not requiring oxygen - Echo EF improved (60-65%) since 10/2023 - Continue incentive spirometry - Encouraged PT/OT - Wean off O2 this AM Uncontrolled type 2 diabetes  mellitus with hyperglycemia (HCC) Glucose dip to 35 overnight, back on carb modified and decrease meal time insulin  to 10u. - Continue Semglee  from 35u BID for better long term control - Continue Aspart 10 units with meals - Continue resistant SSI - CBG check 4x daily with meals, and at bedtime - Continue gabapentin  to 700 mg TID for persistent neuropathic pain of the RLE - Strict carb control diet, discussed at length with patient who is agreeable Chronic health problem Substance Abuse: TOC consulted, cont nicotine  patch 21 mg daily, Nicotine  4 mg gum PRN. UDS 8/15 negative. Insomnia: cont melatonin, hydroxyzine  T2DM Neuropathy: continue Cymbalta  60 mg daily, gabapentin  700mg  TID,  Pressure ulcer: superficial, present on admission. Wound care management as ordered  OA of Hip/Low back pain: uses crutches at home, cont tylenol , ibuprofen , oxy 5 mg q8h PRN;  Alcohol use disorder: Stable. Last drink 8/5. CIWA protocol d/c'd. Vitamin B 100 mg daily, Folic acid  1 mg daily. Hydroxyzine  25 mg 3 times daily as needed for anxiety.  FEN/GI: carb modified PPx: Lovenox  Dispo:SNF pending clinical improvement .   Subjective:  Patient was seen and examined at bedside. She states she had chills intermittently overnight. She does not feel great overall.   Objective: Temp:  [97.6 F (36.4 C)-98.2 F (36.8 C)] 98.2 F (36.8 C) (08/27 0645) Pulse Rate:  [91-92] 92 (08/27 0645) Resp:  [17-18] 18 (08/27 0645) BP: (104-106)/(68-70) 105/70 (08/27 0645) SpO2:  [90 %-93 %] 90 % (08/27 0645) Weight:  [68.5 kg] 68.5 kg (08/27 0500)  Physical Exam: General: A&O, NAD, sitting up comfortably in bed Cardiovascular: RRR, no m/r/g Respiratory: faint crackles at b/l bases improvement from yesterday, good air movement, good respiratory effort, speaks to me in  clear full sentences Abdomen: soft, non-tender, non-distended Extremities: no peripheral edema  Laboratory: Most recent CBC Lab Results  Component  Value Date   WBC 11.9 (H) 02/15/2024   HGB 10.9 (L) 02/15/2024   HCT 33.9 (L) 02/15/2024   MCV 96.6 02/15/2024   PLT 290 02/15/2024   Most recent BMP    Latest Ref Rng & Units 02/14/2024    2:48 AM  BMP  Glucose 70 - 99 mg/dL 652   BUN 6 - 20 mg/dL 27   Creatinine 9.55 - 1.00 mg/dL 9.39   Sodium 864 - 854 mmol/L 134   Potassium 3.5 - 5.1 mmol/L 4.1   Chloride 98 - 111 mmol/L 96   CO2 22 - 32 mmol/L 29   Calcium  8.9 - 10.3 mg/dL 9.0    Lupie Credit, DO 02/15/2024, 7:28 AM  PGY-1, [facility] Family [facility] FPTS Intern pager: [phone], text pages welcome Secure chat group [facility] Teaching Service

## 2024-02-15 NOTE — Progress Notes (Signed)
 Mobility Specialist Progress Note:   02/15/24 1520  Mobility  Activity Ambulated with assistance  Level of Assistance Standby assist, set-up cues, supervision of patient - no hands on  Assistive Device Front wheel walker  Distance Ambulated (ft) 200 ft (x5)  Activity Response Tolerated well  Mobility Referral Yes  Mobility visit 1 Mobility  Mobility Specialist Start Time (ACUTE ONLY) 1351  Mobility Specialist Stop Time (ACUTE ONLY) 1509  Mobility Specialist Time Calculation (min) (ACUTE ONLY) 78 min   Received pt in BR and agreeable to mobility. Pt requested to ambulate outside. RN notified and MD approved. Pt had no c/o. Returned to the unit and room without fault. Pt left in bed with personal belongings and call light within reach. All needs met.   Lavanda Pollack Mobility Specialist  Please contact via [employer] or  Rehab Office [phone]

## 2024-02-15 NOTE — Assessment & Plan Note (Addendum)
 Doesn't feel SOB but reports general malaise today, chills overnight but no fever. O2 sats 91-92% RA, she was placed back on 2L [place] early this AM. Reports dysuria, urinary retention where she needs to stand to empty her bladder completely, no vaginal itching, bleeding or discharge.  - 08/26 walk test: on RA SpO2 89-91% at rest, 92-98% during activity; not requiring oxygen - Echo EF improved (60-65%) since 10/2023 - Continue incentive spirometry - Encouraged PT/OT - Wean off O2 this AM

## 2024-02-15 NOTE — Assessment & Plan Note (Signed)
 Substance Abuse: TOC consulted, cont nicotine  patch 21 mg daily, Nicotine  4 mg gum PRN. UDS 8/15 negative. Insomnia: cont melatonin, hydroxyzine  T2DM Neuropathy: continue Cymbalta  60 mg daily, gabapentin  700mg  TID,  Pressure ulcer: superficial, present on admission. Wound care management as ordered  OA of Hip/Low back pain: uses crutches at home, cont tylenol , ibuprofen , oxy 5 mg q8h PRN;  Alcohol use disorder: Stable. Last drink 8/5. CIWA protocol d/c'd. Vitamin B 100 mg daily, Folic acid  1 mg daily. Hydroxyzine  25 mg 3 times daily as needed for anxiety.

## 2024-02-15 NOTE — Plan of Care (Signed)
 Was not paged for CBG of 35 early evening. Had improved to 86 at time of chart review. Repeat was 411 then subsequently 514. Gave one time half correction of shorting insulin  10u given previous hypoglycemia. Continue CBG checks today.

## 2024-02-15 NOTE — Plan of Care (Signed)
  Problem: Education: Goal: Ability to describe self-care measures that may prevent or decrease complications (Diabetes Survival Skills Education) will improve Outcome: Progressing   Problem: Skin Integrity: Goal: Risk for impaired skin integrity will decrease Outcome: Progressing   Problem: Activity: Goal: Risk for activity intolerance will decrease Outcome: Progressing   Problem: Nutrition: Goal: Adequate nutrition will be maintained Outcome: Progressing

## 2024-02-15 NOTE — Assessment & Plan Note (Signed)
+  Leukocytes on UA with associated dysuria, urinary retention. No vaginal itching, bleeding, or d/c. Most likely source of leukocytosis on CBC. - Begin Cefadroxil  1g BID x5 days 08/27-08/31. Today is Day 1/5.  - Urine cultures pending

## 2024-02-16 DIAGNOSIS — E1165 Type 2 diabetes mellitus with hyperglycemia: Secondary | ICD-10-CM | POA: Diagnosis not present

## 2024-02-16 DIAGNOSIS — N12 Tubulo-interstitial nephritis, not specified as acute or chronic: Secondary | ICD-10-CM | POA: Diagnosis not present

## 2024-02-16 LAB — CBC
HCT: 33.9 % — ABNORMAL LOW (ref 36.0–46.0)
Hemoglobin: 10.9 g/dL — ABNORMAL LOW (ref 12.0–15.0)
MCH: 30.9 pg (ref 26.0–34.0)
MCHC: 32.2 g/dL (ref 30.0–36.0)
MCV: 96 fL (ref 80.0–100.0)
Platelets: 287 K/uL (ref 150–400)
RBC: 3.53 MIL/uL — ABNORMAL LOW (ref 3.87–5.11)
RDW: 11.9 % (ref 11.5–15.5)
WBC: 10.2 K/uL (ref 4.0–10.5)
nRBC: 0 % (ref 0.0–0.2)

## 2024-02-16 LAB — BASIC METABOLIC PANEL WITH GFR
Anion gap: 10 (ref 5–15)
BUN: 20 mg/dL (ref 6–20)
CO2: 28 mmol/L (ref 22–32)
Calcium: 9.2 mg/dL (ref 8.9–10.3)
Chloride: 99 mmol/L (ref 98–111)
Creatinine, Ser: 0.54 mg/dL (ref 0.44–1.00)
GFR, Estimated: >60 mL/min (ref >60)
Glucose, Bld: 281 mg/dL — ABNORMAL HIGH (ref 70–99)
Potassium: 4 mmol/L (ref 3.5–5.1)
Sodium: 137 mmol/L (ref 135–145)

## 2024-02-16 LAB — URINE CULTURE

## 2024-02-16 LAB — MAGNESIUM: Magnesium: 1.4 mg/dL — ABNORMAL LOW (ref 1.7–2.4)

## 2024-02-16 LAB — GLUCOSE, CAPILLARY
Glucose-Capillary: 310 mg/dL — ABNORMAL HIGH (ref 70–99)
Glucose-Capillary: 329 mg/dL — ABNORMAL HIGH (ref 70–99)
Glucose-Capillary: 238 mg/dL — ABNORMAL HIGH (ref 70–99)

## 2024-02-16 MED ORDER — ALBUTEROL SULFATE (2.5 MG/3ML) 0.083% IN NEBU: 2.5000 mg | INHALATION_SOLUTION | Freq: Four times a day (QID) | RESPIRATORY_TRACT | Status: DC | PRN

## 2024-02-16 MED ORDER — INSULIN GLARGINE 100 UNIT/ML ~~LOC~~ SOLN
40.0000 [IU] | Freq: Two times a day (BID) | SUBCUTANEOUS | Status: AC
Start: 2024-02-16 — End: ?

## 2024-02-16 MED ORDER — MAGNESIUM SULFATE 2 GM/50ML IV SOLN
2.0000 g | Freq: Once | INTRAVENOUS | Status: AC
  Administered 2024-02-16: 2 g via INTRAVENOUS
  Filled 2024-02-16: qty 50

## 2024-02-16 MED ORDER — INSULIN ASPART 100 UNIT/ML IJ SOLN
18.0000 [IU] | Freq: Three times a day (TID) | INTRAMUSCULAR | Status: DC
  Administered 2024-02-16: 18 [IU] via SUBCUTANEOUS

## 2024-02-16 MED ORDER — INSULIN ASPART 100 UNIT/ML IJ SOLN: 0.0000 [IU] | Freq: Three times a day (TID) | INTRAMUSCULAR | Status: AC

## 2024-02-16 MED ORDER — INSULIN GLARGINE 100 UNIT/ML ~~LOC~~ SOLN
40.0000 [IU] | Freq: Two times a day (BID) | SUBCUTANEOUS | Status: DC
  Filled 2024-02-16: qty 0.4

## 2024-02-16 MED ORDER — INSULIN ASPART 100 UNIT/ML IJ SOLN
0.0000 [IU] | Freq: Three times a day (TID) | INTRAMUSCULAR | Status: DC
  Administered 2024-02-16: 5 [IU] via SUBCUTANEOUS

## 2024-02-16 MED ORDER — CEFADROXIL 500 MG PO CAPS: 1000.0000 mg | ORAL_CAPSULE | Freq: Two times a day (BID) | ORAL | Status: AC

## 2024-02-16 NOTE — Progress Notes (Signed)
 Nancy [name] to be D/C'd  per MD order.  Discussed with the patient and all questions fully answered.  VSS, Skin clean, dry and intact without evidence of skin break down, no evidence of skin tears noted.  IV catheter discontinued intact. Site without signs and symptoms of complications. Dressing and pressure applied.  Patient instructed to return to ED, call 911, or call MD for any changes in condition.   Patient to transported to [facility] by [facility]. Report given Rockie, RN. AVS given to PTAR to give to facility.

## 2024-02-16 NOTE — Assessment & Plan Note (Addendum)
 Feeling better today O2 sats 96% RA,  has intermittent productive cough but its not bothersome. - 08/26 walk test: on RA SpO2 89-91% at rest, 92-98% during activity; not requiring oxygen - Echo EF improved (60-65%) since 10/2023 - Continue incentive spirometry - Encouraged PT/OT

## 2024-02-16 NOTE — Plan of Care (Signed)

## 2024-02-16 NOTE — TOC Transition Note (Signed)
 Transition of Care [facility]) - Discharge Note   Patient Details  Name: Nancy [name] MRN: [id number] Date of Birth: [birth year]  Transition of Care [facility]) CM/SW Contact:  Luise JAYSON Pan, LCSWA Phone Number: 02/16/2024, 1:42 PM   Clinical Narrative:   Patient will DC to: [facility] Anticipated DC date: 02/16/24  Family notified: No one listed on chart Transport by: [place]    Per MD patient ready for DC to [facility]. RN to call report prior to discharge [phone]; [phone]). RN, patient, patient's family, and facility notified of DC. Discharge Summary and FL2 sent to facility. DC packet on chart. Ambulance transport requested for patient 1:40PM.   CSW will sign off for now as social work intervention is no longer needed. Please consult us  again if new needs arise.      Final next level of care: Skilled Nursing Facility Barriers to Discharge: Barriers Resolved   Patient Goals and CMS Choice Patient states their goals for this hospitalization and ongoing recovery are:: SNF          Discharge Placement PASRR number recieved: 01/26/24            Patient chooses bed at: Other - please specify in the comment section below: [facility]) Patient to be transferred to facility by: PTAR Name of family member notified: No one listed on chart Patient and family notified of of transfer: 02/16/24  Discharge Plan and Services Additional resources added to the After Visit Summary for                                       Social Drivers of Health (SDOH) Interventions SDOH Screenings   Food Insecurity: Food Insecurity Present (01/24/2024)  Housing: High Risk (01/24/2024)  Transportation Needs: Unmet Transportation Needs (01/24/2024)  Utilities: At Risk (01/24/2024)  Financial Resource Strain: Not on File (10/08/2021)   Received from [facility]  Physical Activity: Not on File (10/08/2021)   Received from [facility]  Social Connections: Socially Isolated (10/26/2023)  Stress:  Not on File (10/08/2021)   Received from [facility]  Tobacco Use: High Risk (01/24/2024)     Readmission Risk Interventions     No data to display

## 2024-02-16 NOTE — Progress Notes (Signed)
 Med details completed for discharge packet.

## 2024-02-16 NOTE — Assessment & Plan Note (Signed)
 Substance Abuse: TOC consulted, cont nicotine  patch 21 mg daily, Nicotine  4 mg gum PRN. UDS 8/15 negative. Insomnia: cont melatonin, hydroxyzine  T2DM Neuropathy: continue Cymbalta  60 mg daily, gabapentin  700mg  TID,  Pressure ulcer: superficial, present on admission. Wound care management as ordered  OA of Hip/Low back pain: uses crutches at home, cont tylenol , ibuprofen , oxy 5 mg q8h PRN;  Alcohol use disorder: Stable. Last drink 8/5. CIWA protocol d/c'd. Vitamin B 100 mg daily, Folic acid  1 mg daily. Hydroxyzine  25 mg 3 times daily as needed for anxiety.

## 2024-02-16 NOTE — Progress Notes (Signed)
 OT Cancellation Note  Patient Details Name: MARLETTA BOUSQUET MRN: [id number] DOB: [birth year]   Cancelled Treatment:    Reason Eval/Treat Not Completed: Patient declined, no reason specified (Patient declined OT stating she was too sore and asked OT to reattempt later today. OT to attempt later today as schedule permits)  Jeb LITTIE Laine 02/16/2024, 10:55 AM  Dick Laine, OTA Acute Rehabilitation Services  Office [phone]

## 2024-02-16 NOTE — Assessment & Plan Note (Addendum)
 Dysregulated and difficult to control throughout entire hospital stay. - Back to regular diet to simulate how patient will eat outpatient - Increase Semglee  from 35u BID to 40u BID for better long term control - Increase Aspart 10 to 18 units with meals - Continue moderate SSI - CBG check 4x daily with meals, and at bedtime - Continue gabapentin  to 700 mg TID for persistent neuropathic pain of the RLE - Strict carb control diet, discussed at length with patient who is agreeable

## 2024-02-16 NOTE — Discharge Summary (Addendum)
 Family Medicine Teaching [facility] Discharge Summary  Patient name: Nancy [name] Medical record number: [id number] Date of birth: July 22, 1965 Age: 58 y.o. Gender: female Date of Admission: 01/24/2024  Date of Discharge: 02/16/2024 Admitting Physician: Alfornia Light, DO  Primary Care Provider: Earvin Johnston PARAS, FNP Consultants: None  Indication for Hospitalization: pyelonephritis  Brief Hospital Course:  Nancy [name] is a 58 y.o.female with a history of T2DM, admitted to the [facility] Medicine Teaching Service at [facility] for pyelonephritis. Her hospital course is detailed below:  Pyelonephritis: UA with rare bacteria and WBC clumps, pt endorsing dysuria and vomiting. CT A/P showed suspicion for pyelonephritis in left kidney. Blood cultures showed no growth, urine culture unable to be obtained.  Patient was started on IV Rocephin  for 3 days and transitioned to p.o. Duricef for 5 days.  Fall: Patient states she tripped and recliner fell on top of her. She denies any loss of consciousness, suspect this fall is not pathologic, patient has been endorsing diarrhea for the last 3 weeks and poor p.o. intake which could have contributes to generalized weakness. Pt endorses hitting her head. CT abdomen and pelvis, CT head, CT C-spine negative for acute abnormality. PT/OT have been consulted to evaluate patient. PT is recommending SNF placement.  Type 2 diabetes mellitus with hyperglycemia: A1c on admission is 14.3. Difficult to control T2DM during admission. Regimen at D/C was: Lantus  40 U BID, novolog  18 U with meals and a moderate sensitivity sliding scale. Consider optimizing regimen with oral or other injectable medications as an outpatient.  Hypotension: Suspect hypotension on arrival due to hypovolemia 2/2 to 3 weeks of diarrhea, and some vomiting. Responded well to IV fluid resuscitation, at time of discharge she was not requiring IV fluids to maintain her blood  pressure.  Diarrhea: Patient has been complaining of non-bloody diarrhea for the past 3 weeks, however presenting with unclear etiology.  We will continue to give her IV fluids and monitor bowel movements while she is inpatient.  She is reporting improvement in her diarrhea and she became constipated.  She was then started on MiraLAX  daily for constipation. Non-bloody non-mucous loose stools returned prior to d/c. Appear to be a baseline problem.   Alcohol use disorder: Patient reports drinking 40 ounces of beer daily, her last drink was the morning of admission.  She denies history of DTs and seizures.  Patient is continuously monitored for withdrawal symptoms and placed on CIWA protocol. She was given vitamin B and folic acid  daily.  Her CIWA scores has remained low throughout her hospitalization.  Patient had been complaining of anxiety has ongoing withdrawal symptoms, and was started on hydroxyzine  3 times daily as needed.  Osteoarthritis: X-ray of pelvis showed severe degenerative changes, possible avascular necrosis.  Given Tylenol  650 mg every 6 hours as needed.  Still complaining of pain and so we increased her Tylenol  to 1000 mg every 6 hours as needed, and added 400 mg every 6 hours as needed and low-dose oxycodone  2.5 mg every 6 as needed which was changed to oxycodone  5mg  q8h as needed later in the hospital course.  Still complains of bilateral hip pain with ambulation and at rest.  We discussed with patient that PT and strength training would be best tools for pain control.  SOB  Cough On 17th day of hospital stay, patient developed intermittent SOB and productive cough. CXR significant for mild edema. Diuresed with IV Lasix  20mg  with relief. Echo ordered given new dyspnea and faint heart  sounds revealing left ventricular ejection fraction of 60 to 65%, grade I diastolic dysfunction, and a mobile calcified vegetation. Cardiology was consulted, and no need for TEE.  Patient continued to have  to have desaturations on room air.  Patient placed on 2 L low-flow nasal cannula. Repeat chest x-ray showed continued atelectasis and small pleural effusion, so we redosed her Lasix  and continued recommendation for Incentive spirometry. BNP wnl. Age-adjusted D-dimer wnl, and Wells score 0. Possibly secondary to asthma exacerbation, and patient was started on scheduled albuterol  nebulizers. She did not need home oxygen at discharge.  UTI:  On 22nd day of hospital stay, patient developed UTI sx of dysuria and urinary retention relieved when standing to urinate. UA positive for leukocytes. Cefadroxil  1g BID was given for a 5 day course 08/27-08/31.  Other chronic conditions were medically managed with home medications and formulary alternatives as necessary (insomnia, diabetic neuropathy, pressure ulcer, constipation, chronic hip pain).  PCP Follow-up Recommendations: Readdress insulin  requirements - uncontrolled Address food insecurity PT for severe OA Follow-up pressure ulcer Consider ABIs Substance use counseling - alcohol and cocaine Pulmonology workup if continues to need oxygen outpatient Outpatient PFTs for concern for COPD Reassess microscopic hematuria  Discharge Diagnoses/Problem List:  Hospital Problems      Hospital     Uncontrolled type 2 diabetes mellitus with hyperglycemia (HCC)     Hyperglycemia due to diabetes mellitus (HCC)     Low back pain     OA (osteoarthritis) of hip     Substance abuse (HCC)     Pressure ulcer     Type 2 diabetes mellitus with diabetic neuropathy, unspecified (HCC)     Insomnia due to psychological stress     Muscle spasm   Switched flexeril  to robaxin  8/15. Consider increase in gabapentin  tomorrow if still having pain.         Constipation     Chronic health problem     Protein-calorie malnutrition, severe     Dyspnea     Hypoxia     UTI (urinary tract infection)    Disposition: [facility] SN[facility]   Discharge Condition:  Stable  Discharge Exam:  General: A&O, NAD, lying comfortably supine in bed Cardiac: RRR, no m/r/g Respiratory: CTAB, normal WOB, no w/c/r, good respiratory effort and air movement GI: Soft, NTTP, non-distended Extremities: NTTP, no peripheral edema. Neuro: moves all four extremities appropriately Psych: Appropriate mood and affect GU: chaperone present: Prentice Favors [facility]. Mild TTP of the musculature of the lower suprapubic/pelvic area  Significant Labs and Imaging:  Recent Labs  Lab 02/15/24 0233 02/16/24 0428  WBC 11.9* 10.2  HGB 10.9* 10.9*  HCT 33.9* 33.9*  PLT 290 287   Recent Labs  Lab 02/16/24 0428  NA 137  K 4.0  CL 99  CO2 28  GLUCOSE 281*  BUN 20  CREATININE 0.54  CALCIUM  9.2  MG 1.4*   Discharge Medications:  Allergies as of 02/16/2024       Reactions   Glucophage  [metformin ] Diarrhea        Medication List     STOP taking these medications    aspirin  EC 81 MG tablet   escitalopram  10 MG tablet Commonly known as: LEXAPRO    glimepiride  2 MG tablet Commonly known as: AMARYL    nicotine  21 mg/24hr patch Commonly known as: NICODERM CQ  - dosed in mg/24 hours Replaced by: nicotine  14 mg/24hr patch   NovoLOG  FlexPen 100 UNIT/ML FlexPen Generic drug: insulin  aspart Replaced by: insulin  aspart 100 UNIT/ML  injection       TAKE these medications    Accu-Chek Guide Test test strip Generic drug: glucose blood Use to check blood sugar 4 (four) times daily -  before meals and at bedtime.   Accu-Chek Softclix Lancets lancets Use as instructed   acetaminophen  500 MG tablet Commonly known as: TYLENOL  Take 2 tablets (1,000 mg total) by mouth every 6 (six) hours as needed.   atorvastatin  40 MG tablet Commonly known as: LIPITOR Take 1 tablet (40 mg total) by mouth daily.   cefadroxil  500 MG capsule Commonly known as: DURICEF Take 2 capsules (1,000 mg total) by mouth 2 (two) times daily for 3 days.   Droplet Pen Needles 31G X 5 MM  Misc Generic drug: Insulin  Pen Needle Use with insulin  4 (four) times daily -  before meals and at bedtime.   DULoxetine  60 MG capsule Commonly known as: CYMBALTA  Take 1 capsule (60 mg total) by mouth daily.   gabapentin  300 MG capsule Commonly known as: NEURONTIN  Take 2 capsules (600 mg total) by mouth every 8 (eight) hours.   Gerhardt's butt cream Crea Apply 1 Application topically 2 (two) times daily.   hydrOXYzine  25 MG tablet Commonly known as: ATARAX  Take 1 tablet (25 mg total) by mouth 3 (three) times daily as needed for anxiety.   insulin  aspart 100 UNIT/ML injection Commonly known as: novoLOG  Inject 18 Units into the skin 3 (three) times daily with meals. Replaces: NovoLOG  FlexPen 100 UNIT/ML FlexPen   insulin  aspart 100 UNIT/ML injection Commonly known as: novoLOG  Inject 0-15 Units into the skin 3 (three) times daily with meals.   insulin  glargine 100 UNIT/ML Solostar Pen Commonly known as: LANTUS  Inject 55 Units into the skin daily. What changed: how much to take   insulin  glargine 100 UNIT/ML injection Commonly known as: LANTUS  Inject 0.4 mLs (40 Units total) into the skin 2 (two) times daily. What changed: You were already taking a medication with the same name, and this prescription was added. Make sure you understand how and when to take each.   leptospermum manuka honey Pste paste Apply 1 Application topically daily.   lidocaine  5 % Commonly known as: LIDODERM  Place 1 patch onto the skin daily. Remove & Discard patch within 12 hours or as directed by MD   methocarbamol  750 MG tablet Commonly known as: ROBAXIN  Take 1 tablet (750 mg total) by mouth every 6 (six) hours as needed for muscle spasms.   multivitamin with minerals Tabs tablet Take 1 tablet by mouth daily.   nicotine  14 mg/24hr patch Commonly known as: NICODERM CQ  - dosed in mg/24 hours Place 1 patch (14 mg total) onto the skin daily. Replaces: nicotine  21 mg/24hr patch   thiamine  100  MG tablet Commonly known as: Vitamin B-1 Take 1 tablet (100 mg total) by mouth daily.   Ventolin  HFA 108 (90 Base) MCG/ACT inhaler Generic drug: albuterol  Inhale 1-2 puffs into the lungs every 4 (four) hours as needed for wheezing or shortness of breath.       Discharge Instructions: Please refer to Patient Instructions section of EMR for full details.  Patient was counseled important signs and symptoms that should prompt return to medical care, changes in medications, dietary instructions, activity restrictions, and follow up appointments.   Contact information for after-discharge care     Destination     [facility] .   Service: Skilled Nursing Contact information: [address] [place]  [address] [phone]  Lupie Credit, DO 02/16/2024, 12:51 PM PGY-1, [facility]  Upper Level Addendum: I have seen and evaluated this patient along with Dr. Lupie and reviewed the above note, making necessary revisions as appropriate. I agree with the medical decision making and physical exam as noted above. Gladis Church, DO PGY-3 [facility] Family Medicine Residency

## 2024-02-16 NOTE — Assessment & Plan Note (Addendum)
+  Leukocytes on UA 08/26 with associated dysuria, urinary retention. No vaginal itching, bleeding, or d/c. Most likely source of leukocytosis on CBC. Dysuria improving today. - Begin Cefadroxil  1g BID x5 days 08/27-08/31. Today is Day 2/5.  - Urine cultures positive for multiple species most likely contaminant, not helpful to get another at this time since she has already started abx.

## 2024-02-16 NOTE — Progress Notes (Signed)
 Daily Progress Note Intern Pager: [phone]  Patient name: Nancy [name] Medical record number: [id number] Date of birth: [birth year] Age: 58 y.o. Gender: female  Primary Care Provider: Earvin Johnston PARAS, FNP Consultants: None Code Status: Full  Pt Overview and Major Events to Date:  08/05: Admitted for pyelonephritis 08/11: Completed abx course for pyelonephritis 08/12: Medically stable for discharge pending SNF placement 08/19: Awaiting insurance auth for [facility] 08/22: Approved auth and bed available at [place], but new O2 requirement 08/22-08/24: Workup for new intermittent O2 requirement; indicative of ?new onset CHF vs asthma exacerbation 08/26: Medically stable for discharge without needing O2 requirement 08/27: UTI treatment initiated with 5 day course of Cefadroxil  08/28: Reassessed and medically stable for d/c w/o O2 requirement; [place] bed ready  Assessment and Plan:  Nancy [name] is a 58yo F initially admitted for pyelonephritis that is resolved, now with intermittent dyspnea requiring O2 supplementation at times.. Suspect dyspnea secondary to interstitial edema from unclear origin after workup. Now with UTI symptoms and positive leukocytes on UA.  Assessment & Plan UTI (urinary tract infection) +Leukocytes on UA 08/26 with associated dysuria, urinary retention. No vaginal itching, bleeding, or d/c. Most likely source of leukocytosis on CBC. Dysuria improving today. - Begin Cefadroxil  1g BID x5 days 08/27-08/31. Today is Day 2/5.  - Urine cultures positive for multiple species most likely contaminant, not helpful to get another at this time since she has already started abx.   Dyspnea Feeling better today O2 sats 96% RA,  has intermittent productive cough but its not bothersome. - 08/26 walk test: on RA SpO2 89-91% at rest, 92-98% during activity; not requiring oxygen - Echo EF improved (60-65%) since 10/2023 - Continue incentive spirometry - Encouraged  PT/OT Uncontrolled type 2 diabetes mellitus with hyperglycemia (HCC) Dysregulated and difficult to control throughout entire hospital stay. - Back to regular diet to simulate how patient will eat outpatient - Increase Semglee  from 35u BID to 40u BID for better long term control - Increase Aspart 10 to 18 units with meals - Continue moderate SSI - CBG check 4x daily with meals, and at bedtime - Continue gabapentin  to 700 mg TID for persistent neuropathic pain of the RLE - Strict carb control diet, discussed at length with patient who is agreeable Chronic health problem Substance Abuse: TOC consulted, cont nicotine  patch 21 mg daily, Nicotine  4 mg gum PRN. UDS 8/15 negative. Insomnia: cont melatonin, hydroxyzine  T2DM Neuropathy: continue Cymbalta  60 mg daily, gabapentin  700mg  TID,  Pressure ulcer: superficial, present on admission. Wound care management as ordered  OA of Hip/Low back pain: uses crutches at home, cont tylenol , ibuprofen , oxy 5 mg q8h PRN;  Alcohol use disorder: Stable. Last drink 8/5. CIWA protocol d/c'd. Vitamin B 100 mg daily, Folic acid  1 mg daily. Hydroxyzine  25 mg 3 times daily as needed for anxiety.  FEN/GI: regular diet PPx: Lovenox  Dispo:SNF today.  Subjective:  Patient was seen and evaluated at bedside. She is lying supine comfortably on RA. She does have some pain to the R groin which she describes as muscular. No other complaints at this time.  Objective: Temp:  [98.1 F (36.7 C)-98.3 F (36.8 C)] 98.1 F (36.7 C) (08/28 0611) Pulse Rate:  [95-104] 95 (08/28 0611) Resp:  [16-17] 17 (08/28 0611) BP: (108-130)/(67-74) 111/67 (08/28 0611) SpO2:  [94 %-98 %] 96 % (08/28 9388)  Physical Exam: General: A&O, NAD, lying comfortably supine in bed Cardiac: RRR, no m/r/g Respiratory: CTAB, normal WOB, no w/c/r,  good respiratory effort and air movement GI: Soft, NTTP, non-distended Extremities: NTTP, no peripheral edema. Neuro: moves all four extremities  appropriately Psych: Appropriate mood and affect GU: chaperone present: Prentice Favors [facility]. Mild TTP of the musculature of the lower suprapubic/pelvic area   Laboratory: Most recent CBC Lab Results  Component Value Date   WBC 10.2 02/16/2024   HGB 10.9 (L) 02/16/2024   HCT 33.9 (L) 02/16/2024   MCV 96.0 02/16/2024   PLT 287 02/16/2024   Most recent BMP    Latest Ref Rng & Units 02/16/2024    4:28 AM  BMP  Glucose 70 - 99 mg/dL 718   BUN 6 - 20 mg/dL 20   Creatinine 9.55 - 1.00 mg/dL 9.45   Sodium 864 - 854 mmol/L 137   Potassium 3.5 - 5.1 mmol/L 4.0   Chloride 98 - 111 mmol/L 99   CO2 22 - 32 mmol/L 28   Calcium  8.9 - 10.3 mg/dL 9.2    Lupie Credit, DO 02/16/2024, 7:24 AM  PGY-1, [facility] FPTS Intern pager: [phone], text pages welcome Secure chat group [facility] Teaching Service

## 2024-03-08 ENCOUNTER — Ambulatory Visit: Admitting: Podiatry

## 2024-03-18 ENCOUNTER — Encounter (HOSPITAL_COMMUNITY): Payer: Self-pay | Admitting: Emergency Medicine

## 2024-03-18 ENCOUNTER — Inpatient Hospital Stay (HOSPITAL_COMMUNITY)

## 2024-03-18 ENCOUNTER — Emergency Department (HOSPITAL_COMMUNITY)

## 2024-03-18 ENCOUNTER — Other Ambulatory Visit: Payer: Self-pay

## 2024-03-18 ENCOUNTER — Inpatient Hospital Stay (HOSPITAL_COMMUNITY)
Admission: EM | Admit: 2024-03-18 | Discharge: 2024-03-22 | DRG: 193 | Disposition: A | Discharge status: Skilled Nursing Facility | Source: Skilled Nursing Facility | Attending: Internal Medicine | Admitting: Internal Medicine

## 2024-03-18 DIAGNOSIS — Z794 Long term (current) use of insulin: Secondary | ICD-10-CM | POA: Diagnosis not present

## 2024-03-18 DIAGNOSIS — E1165 Type 2 diabetes mellitus with hyperglycemia: Secondary | ICD-10-CM | POA: Diagnosis present

## 2024-03-18 DIAGNOSIS — Z6822 Body mass index (BMI) 22.0-22.9, adult: Secondary | ICD-10-CM

## 2024-03-18 DIAGNOSIS — R0902 Hypoxemia: Secondary | ICD-10-CM | POA: Diagnosis present

## 2024-03-18 DIAGNOSIS — R1011 Right upper quadrant pain: Secondary | ICD-10-CM | POA: Diagnosis present

## 2024-03-18 DIAGNOSIS — E43 Unspecified severe protein-calorie malnutrition: Secondary | ICD-10-CM | POA: Diagnosis present

## 2024-03-18 DIAGNOSIS — G8929 Other chronic pain: Secondary | ICD-10-CM | POA: Diagnosis present

## 2024-03-18 DIAGNOSIS — J181 Lobar pneumonia, unspecified organism: Secondary | ICD-10-CM | POA: Diagnosis present

## 2024-03-18 DIAGNOSIS — F17210 Nicotine dependence, cigarettes, uncomplicated: Secondary | ICD-10-CM | POA: Diagnosis present

## 2024-03-18 DIAGNOSIS — R188 Other ascites: Secondary | ICD-10-CM | POA: Diagnosis present

## 2024-03-18 DIAGNOSIS — E162 Hypoglycemia, unspecified: Secondary | ICD-10-CM | POA: Diagnosis present

## 2024-03-18 DIAGNOSIS — Z79899 Other long term (current) drug therapy: Secondary | ICD-10-CM | POA: Diagnosis not present

## 2024-03-18 DIAGNOSIS — Z888 Allergy status to other drugs, medicaments and biological substances status: Secondary | ICD-10-CM

## 2024-03-18 DIAGNOSIS — M79606 Pain in leg, unspecified: Secondary | ICD-10-CM | POA: Diagnosis present

## 2024-03-18 DIAGNOSIS — F1120 Opioid dependence, uncomplicated: Secondary | ICD-10-CM | POA: Diagnosis present

## 2024-03-18 DIAGNOSIS — R7401 Elevation of levels of liver transaminase levels: Secondary | ICD-10-CM | POA: Diagnosis not present

## 2024-03-18 DIAGNOSIS — K760 Fatty (change of) liver, not elsewhere classified: Secondary | ICD-10-CM | POA: Diagnosis present

## 2024-03-18 DIAGNOSIS — E1140 Type 2 diabetes mellitus with diabetic neuropathy, unspecified: Secondary | ICD-10-CM | POA: Diagnosis present

## 2024-03-18 DIAGNOSIS — F39 Unspecified mood [affective] disorder: Secondary | ICD-10-CM | POA: Diagnosis present

## 2024-03-18 DIAGNOSIS — K8020 Calculus of gallbladder without cholecystitis without obstruction: Secondary | ICD-10-CM | POA: Diagnosis present

## 2024-03-18 DIAGNOSIS — E11649 Type 2 diabetes mellitus with hypoglycemia without coma: Secondary | ICD-10-CM | POA: Diagnosis present

## 2024-03-18 DIAGNOSIS — J189 Pneumonia, unspecified organism: Secondary | ICD-10-CM | POA: Diagnosis not present

## 2024-03-18 DIAGNOSIS — K219 Gastro-esophageal reflux disease without esophagitis: Secondary | ICD-10-CM | POA: Diagnosis present

## 2024-03-18 DIAGNOSIS — E785 Hyperlipidemia, unspecified: Secondary | ICD-10-CM | POA: Diagnosis present

## 2024-03-18 DIAGNOSIS — Z9289 Personal history of other medical treatment: Secondary | ICD-10-CM

## 2024-03-18 DIAGNOSIS — J9601 Acute respiratory failure with hypoxia: Secondary | ICD-10-CM | POA: Diagnosis present

## 2024-03-18 DIAGNOSIS — Y95 Nosocomial condition: Secondary | ICD-10-CM | POA: Diagnosis present

## 2024-03-18 DIAGNOSIS — R7989 Other specified abnormal findings of blood chemistry: Secondary | ICD-10-CM

## 2024-03-18 DIAGNOSIS — I5031 Acute diastolic (congestive) heart failure: Secondary | ICD-10-CM | POA: Diagnosis present

## 2024-03-18 DIAGNOSIS — D649 Anemia, unspecified: Secondary | ICD-10-CM | POA: Diagnosis present

## 2024-03-18 DIAGNOSIS — J810 Acute pulmonary edema: Secondary | ICD-10-CM

## 2024-03-18 DIAGNOSIS — A0472 Enterocolitis due to Clostridium difficile, not specified as recurrent: Secondary | ICD-10-CM | POA: Diagnosis present

## 2024-03-18 DIAGNOSIS — E7841 Elevated Lipoprotein(a): Secondary | ICD-10-CM | POA: Diagnosis not present

## 2024-03-18 DIAGNOSIS — M160 Bilateral primary osteoarthritis of hip: Secondary | ICD-10-CM | POA: Diagnosis present

## 2024-03-18 DIAGNOSIS — M169 Osteoarthritis of hip, unspecified: Secondary | ICD-10-CM | POA: Diagnosis present

## 2024-03-18 DIAGNOSIS — R52 Pain, unspecified: Secondary | ICD-10-CM

## 2024-03-18 LAB — CBC WITH DIFFERENTIAL/PLATELET
Abs Immature Granulocytes: 0.03 K/uL (ref 0.00–0.07)
Basophils Absolute: 0.1 K/uL (ref 0.0–0.1)
Basophils Relative: 1 %
Eosinophils Absolute: 0.3 K/uL (ref 0.0–0.5)
Eosinophils Relative: 3 %
HCT: 35.8 % — ABNORMAL LOW (ref 36.0–46.0)
Hemoglobin: 11.5 g/dL — ABNORMAL LOW (ref 12.0–15.0)
Immature Granulocytes: 0 %
Lymphocytes Relative: 24 %
Lymphs Abs: 2.2 K/uL (ref 0.7–4.0)
MCH: 30.3 pg (ref 26.0–34.0)
MCHC: 32.1 g/dL (ref 30.0–36.0)
MCV: 94.5 fL (ref 80.0–100.0)
Monocytes Absolute: 0.6 K/uL (ref 0.1–1.0)
Monocytes Relative: 6 %
Neutro Abs: 6.1 K/uL (ref 1.7–7.7)
Neutrophils Relative %: 66 %
Platelets: 307 K/uL (ref 150–400)
RBC: 3.79 MIL/uL — ABNORMAL LOW (ref 3.87–5.11)
RDW: 12 % (ref 11.5–15.5)
WBC: 9.3 K/uL (ref 4.0–10.5)
nRBC: 0 % (ref 0.0–0.2)

## 2024-03-18 LAB — COMPREHENSIVE METABOLIC PANEL WITH GFR
ALT: 143 U/L — ABNORMAL HIGH (ref 0–44)
AST: 86 U/L — ABNORMAL HIGH (ref 15–41)
Albumin: 3.4 g/dL — ABNORMAL LOW (ref 3.5–5.0)
Alkaline Phosphatase: 197 U/L — ABNORMAL HIGH (ref 38–126)
Anion gap: 11 (ref 5–15)
BUN: 11 mg/dL (ref 6–20)
CO2: 27 mmol/L (ref 22–32)
Calcium: 9.2 mg/dL (ref 8.9–10.3)
Chloride: 98 mmol/L (ref 98–111)
Creatinine, Ser: 0.34 mg/dL — ABNORMAL LOW (ref 0.44–1.00)
GFR, Estimated: >60 mL/min (ref >60)
Glucose, Bld: 52 mg/dL — ABNORMAL LOW (ref 70–99)
Potassium: 4 mmol/L (ref 3.5–5.1)
Sodium: 136 mmol/L (ref 135–145)
Total Bilirubin: 0.4 mg/dL (ref 0.0–1.2)
Total Protein: 7 g/dL (ref 6.5–8.1)

## 2024-03-18 LAB — GLUCOSE, CAPILLARY
Glucose-Capillary: 77 mg/dL (ref 70–99)
Glucose-Capillary: 190 mg/dL — ABNORMAL HIGH (ref 70–99)

## 2024-03-18 LAB — LIPASE, BLOOD: Lipase: 29 U/L (ref 11–51)

## 2024-03-18 LAB — URINALYSIS, ROUTINE W REFLEX MICROSCOPIC
Bacteria, UA: NONE SEEN
Bilirubin Urine: NEGATIVE
Glucose, UA: NEGATIVE mg/dL
Hgb urine dipstick: NEGATIVE
Ketones, ur: NEGATIVE mg/dL
Nitrite: NEGATIVE
Protein, ur: NEGATIVE mg/dL
Specific Gravity, Urine: 1.011 (ref 1.005–1.030)
WBC, UA: >50 WBC/hpf (ref 0–5)
pH: 5 (ref 5.0–8.0)

## 2024-03-18 LAB — D-DIMER, QUANTITATIVE: D-Dimer, Quant: 1.58 ug{FEU}/mL — ABNORMAL HIGH (ref 0.00–0.50)

## 2024-03-18 LAB — CBC
HCT: 33.2 % — ABNORMAL LOW (ref 36.0–46.0)
Hemoglobin: 10.8 g/dL — ABNORMAL LOW (ref 12.0–15.0)
MCH: 30.8 pg (ref 26.0–34.0)
MCHC: 32.5 g/dL (ref 30.0–36.0)
MCV: 94.6 fL (ref 80.0–100.0)
Platelets: 310 K/uL (ref 150–400)
RBC: 3.51 MIL/uL — ABNORMAL LOW (ref 3.87–5.11)
RDW: 12.2 % (ref 11.5–15.5)
WBC: 8.6 K/uL (ref 4.0–10.5)
nRBC: 0 % (ref 0.0–0.2)

## 2024-03-18 LAB — CBG MONITORING, ED
Glucose-Capillary: 33 mg/dL — CL (ref 70–99)
Glucose-Capillary: 101 mg/dL — ABNORMAL HIGH (ref 70–99)
Glucose-Capillary: 84 mg/dL (ref 70–99)

## 2024-03-18 MED ORDER — IOHEXOL 350 MG/ML SOLN
75.0000 mL | Freq: Once | INTRAVENOUS | Status: AC | PRN
  Administered 2024-03-18: 75 mL via INTRAVENOUS

## 2024-03-18 MED ORDER — SODIUM CHLORIDE 0.9 % IV SOLN
2.0000 g | Freq: Once | INTRAVENOUS | Status: AC
  Administered 2024-03-18: 2 g via INTRAVENOUS
  Filled 2024-03-18: qty 12.5

## 2024-03-18 MED ORDER — GADOBUTROL 1 MMOL/ML IV SOLN
7.0000 mL | Freq: Once | INTRAVENOUS | Status: AC | PRN
Start: 2024-03-18 — End: 2024-03-18
  Administered 2024-03-18: 7 mL via INTRAVENOUS

## 2024-03-18 MED ORDER — FUROSEMIDE 10 MG/ML IJ SOLN
20.0000 mg | Freq: Three times a day (TID) | INTRAMUSCULAR | Status: DC
  Administered 2024-03-18 – 2024-03-19 (×3): 20 mg via INTRAVENOUS
  Filled 2024-03-18 (×3): qty 2

## 2024-03-18 MED ORDER — ACETAMINOPHEN 650 MG RE SUPP
650.0000 mg | Freq: Four times a day (QID) | RECTAL | Status: DC | PRN
  Administered 2024-03-20: 650 mg via RECTAL
  Filled 2024-03-18: qty 1

## 2024-03-18 MED ORDER — FENTANYL CITRATE PF 50 MCG/ML IJ SOSY: 12.5000 ug | PREFILLED_SYRINGE | INTRAMUSCULAR | Status: DC | PRN

## 2024-03-18 MED ORDER — ACETAMINOPHEN 325 MG PO TABS
650.0000 mg | ORAL_TABLET | Freq: Four times a day (QID) | ORAL | Status: DC | PRN
  Administered 2024-03-18 – 2024-03-19 (×2): 650 mg via ORAL
  Filled 2024-03-18 (×2): qty 2

## 2024-03-18 MED ORDER — NICOTINE 21 MG/24HR TD PT24
21.0000 mg | MEDICATED_PATCH | Freq: Every day | TRANSDERMAL | Status: DC
  Administered 2024-03-18 – 2024-03-22 (×5): 21 mg via TRANSDERMAL
  Filled 2024-03-18 (×5): qty 1

## 2024-03-18 MED ORDER — DEXTROSE 50 % IV SOLN
25.0000 g | Freq: Once | INTRAVENOUS | Status: AC
  Administered 2024-03-18: 25 g via INTRAVENOUS
  Filled 2024-03-18: qty 50

## 2024-03-18 MED ORDER — KETOROLAC TROMETHAMINE 30 MG/ML IJ SOLN
30.0000 mg | Freq: Four times a day (QID) | INTRAMUSCULAR | Status: DC | PRN
  Administered 2024-03-18 – 2024-03-22 (×7): 30 mg via INTRAVENOUS
  Filled 2024-03-18 (×7): qty 1

## 2024-03-18 MED ORDER — SODIUM CHLORIDE 0.9 % IV SOLN
2.0000 g | Freq: Three times a day (TID) | INTRAVENOUS | Status: DC
  Administered 2024-03-18 – 2024-03-22 (×11): 2 g via INTRAVENOUS
  Filled 2024-03-18 (×12): qty 12.5

## 2024-03-18 MED ORDER — VANCOMYCIN HCL 1500 MG/300ML IV SOLN
1500.0000 mg | Freq: Once | INTRAVENOUS | Status: AC
Start: 2024-03-18 — End: 2024-03-18
  Administered 2024-03-18: 1500 mg via INTRAVENOUS
  Filled 2024-03-18: qty 300

## 2024-03-18 MED ORDER — ONDANSETRON HCL 4 MG PO TABS
4.0000 mg | ORAL_TABLET | Freq: Four times a day (QID) | ORAL | Status: DC | PRN
  Administered 2024-03-20 (×2): 4 mg via ORAL
  Filled 2024-03-18 (×3): qty 1

## 2024-03-18 MED ORDER — VANCOMYCIN HCL IN DEXTROSE 1-5 GM/200ML-% IV SOLN
1000.0000 mg | Freq: Two times a day (BID) | INTRAVENOUS | Status: DC
  Administered 2024-03-19 – 2024-03-20 (×3): 1000 mg via INTRAVENOUS
  Filled 2024-03-18 (×3): qty 200

## 2024-03-18 MED ORDER — LORAZEPAM 0.5 MG PO TABS: 0.5000 mg | ORAL_TABLET | Freq: Once | ORAL | Status: DC | PRN

## 2024-03-18 MED ORDER — ONDANSETRON HCL 4 MG/2ML IJ SOLN
4.0000 mg | Freq: Four times a day (QID) | INTRAMUSCULAR | Status: DC | PRN
  Administered 2024-03-19 – 2024-03-20 (×3): 4 mg via INTRAVENOUS
  Filled 2024-03-18 (×4): qty 2

## 2024-03-18 MED ORDER — INSULIN ASPART 100 UNIT/ML IJ SOLN
0.0000 [IU] | Freq: Three times a day (TID) | INTRAMUSCULAR | Status: DC
  Administered 2024-03-19: 2 [IU] via SUBCUTANEOUS
  Administered 2024-03-20: 3 [IU] via SUBCUTANEOUS
  Administered 2024-03-20: 2 [IU] via SUBCUTANEOUS

## 2024-03-18 MED ORDER — PANTOPRAZOLE SODIUM 40 MG PO TBEC
40.0000 mg | DELAYED_RELEASE_TABLET | Freq: Every evening | ORAL | Status: DC
  Administered 2024-03-18 – 2024-03-21 (×4): 40 mg via ORAL
  Filled 2024-03-18 (×4): qty 1

## 2024-03-18 MED ORDER — OXYCODONE HCL 5 MG PO TABS: 5.0000 mg | ORAL_TABLET | Freq: Four times a day (QID) | ORAL | Status: AC | PRN

## 2024-03-18 MED ORDER — METRONIDAZOLE 500 MG/100ML IV SOLN
500.0000 mg | Freq: Two times a day (BID) | INTRAVENOUS | Status: DC
  Administered 2024-03-18 – 2024-03-22 (×9): 500 mg via INTRAVENOUS
  Filled 2024-03-18 (×9): qty 100

## 2024-03-18 MED ORDER — HEPARIN SODIUM (PORCINE) 5000 UNIT/ML IJ SOLN
5000.0000 [IU] | Freq: Three times a day (TID) | INTRAMUSCULAR | Status: DC
Start: 2024-03-18 — End: 2024-03-22
  Administered 2024-03-18 – 2024-03-22 (×12): 5000 [IU] via SUBCUTANEOUS
  Filled 2024-03-18 (×12): qty 1

## 2024-03-18 MED ORDER — HYDROXYZINE HCL 10 MG PO TABS
10.0000 mg | ORAL_TABLET | Freq: Three times a day (TID) | ORAL | Status: DC | PRN
  Administered 2024-03-18 – 2024-03-22 (×4): 10 mg via ORAL
  Filled 2024-03-18 (×4): qty 1

## 2024-03-18 MED ORDER — VANCOMYCIN HCL IN DEXTROSE 1-5 GM/200ML-% IV SOLN: 1000.0000 mg | Freq: Once | INTRAVENOUS | Status: DC

## 2024-03-18 NOTE — ED Provider Notes (Signed)
 [place] EMERGENCY DEPARTMENT AT [facility] Provider Note   CSN: [id number] Arrival date & time: 03/18/24  1013     Patient presents with: Abdominal Pain   ADLENE ADDUCI is a 58 y.o. female who presents emergency department from skilled nursing facility for acute flank pain. hospitalized and discharged from hospital discharge to skilled nursing facility about 1 month ago.  She was hospitalized due to fall, pyelonephritis, hypotension and hypoglycemia.  She has a history of chronic alcohol abuse.  Patient reports that in the middle of the night she began having severe flank pain that then radiated down her back and into her leg.  She arrives hypoxic at 84% on room air and states that she is not normally on oxygen at baseline.  She did take Suboxone this morning which she is prescribed and was given this at the SNF.  Patient reports that she has a history of bilateral hip pain and lower back pain however this is different from previous.  She is not feeling particularly short of breath.  She has a known history of gallstones and thought it might be related to this.  She denies nausea or vomiting.  She denies any new urinary symptoms.    Abdominal Pain      Prior to Admission medications   Medication Sig Start Date End Date Taking? Authorizing Provider  Accu-Chek Softclix Lancets lancets Use as instructed 01/04/24   Doretha Folks, MD  acetaminophen  (TYLENOL ) 500 MG tablet Take 2 tablets (1,000 mg total) by mouth every 6 (six) hours as needed. 02/14/24   Howell Lunger, DO  albuterol  (VENTOLIN  HFA) 108 (90 Base) MCG/ACT inhaler Inhale 1-2 puffs into the lungs every 4 (four) hours as needed for wheezing or shortness of breath. 10/27/23   Patsy Lenis, MD  atorvastatin  (LIPITOR) 40 MG tablet Take 1 tablet (40 mg total) by mouth daily. 10/28/23   Patsy Lenis, MD  DULoxetine  (CYMBALTA ) 60 MG capsule Take 1 capsule (60 mg total) by mouth daily. 02/14/24   Howell Lunger, DO   gabapentin  (NEURONTIN ) 300 MG capsule Take 2 capsules (600 mg total) by mouth every 8 (eight) hours. 02/14/24   Howell Lunger, DO  Glucose Blood (BLOOD GLUCOSE TEST STRIPS) STRP Use to check blood sugar 4 (four) times daily -  before meals and at bedtime. 01/04/24   Doretha Folks, MD  hydrOXYzine  (ATARAX ) 25 MG tablet Take 1 tablet (25 mg total) by mouth 3 (three) times daily as needed for anxiety. 02/14/24   Howell Lunger, DO  insulin  aspart (NOVOLOG ) 100 UNIT/ML injection Inject 18 Units into the skin 3 (three) times daily with meals. 02/14/24   Howell Lunger, DO  insulin  aspart (NOVOLOG ) 100 UNIT/ML injection Inject 0-15 Units into the skin 3 (three) times daily with meals. 02/16/24   Howell Lunger, DO  insulin  glargine (LANTUS ) 100 UNIT/ML injection Inject 0.4 mLs (40 Units total) into the skin 2 (two) times daily. 02/16/24   Howell Lunger, DO  insulin  glargine (LANTUS ) 100 UNIT/ML Solostar Pen Inject 55 Units into the skin daily. 02/14/24 03/15/24  Howell Lunger, DO  Insulin  Pen Needle (PEN NEEDLES) 31G X 5 MM MISC Use with insulin  4 (four) times daily -  before meals and at bedtime. 10/27/23   Patsy Lenis, MD  leptospermum manuka honey (MEDIHONEY) PSTE paste Apply 1 Application topically daily. 02/14/24   Howell Lunger, DO  lidocaine  (LIDODERM ) 5 % Place 1 patch onto the skin daily. Remove & Discard patch within 12 hours or as  directed by MD 02/14/24   Howell Lunger, DO  methocarbamol  (ROBAXIN ) 750 MG tablet Take 1 tablet (750 mg total) by mouth every 6 (six) hours as needed for muscle spasms. 02/14/24   Howell Lunger, DO  Multiple Vitamin (MULTIVITAMIN WITH MINERALS) TABS tablet Take 1 tablet by mouth daily. 02/14/24   Howell Lunger, DO  nicotine  (NICODERM CQ  - DOSED IN MG/24 HOURS) 14 mg/24hr patch Place 1 patch (14 mg total) onto the skin daily. 02/15/24   Howell Lunger, DO  Nystatin  (GERHARDT'S BUTT CREAM) CREA Apply 1 Application topically 2 (two) times daily. 02/14/24    Howell Lunger, DO  thiamine  (VITAMIN B-1) 100 MG tablet Take 1 tablet (100 mg total) by mouth daily. 02/14/24   Howell Lunger, DO    Allergies: Glucophage  [metformin ]    Review of Systems  Gastrointestinal:  Positive for abdominal pain.    Updated Vital Signs BP 127/80   Pulse 89   Temp 97.7 F (36.5 C) (Oral)   Resp 18   Ht 5' 9 (1.753 m)   Wt 68 kg   SpO2 91%   BMI 22.14 kg/m   Physical Exam Vitals and nursing note reviewed.  Constitutional:      General: She is not in acute distress.    Appearance: She is well-developed. She is not diaphoretic.     Comments: Patient placed on 2 L via nasal cannula now oxygen saturation 98%  HENT:     Head: Normocephalic and atraumatic.     Right Ear: External ear normal.     Left Ear: External ear normal.     Nose: Nose normal.     Mouth/Throat:     Mouth: Mucous membranes are moist.  Eyes:     General: No scleral icterus.    Conjunctiva/sclera: Conjunctivae normal.  Cardiovascular:     Rate and Rhythm: Normal rate and regular rhythm.     Pulses:          Posterior tibial pulses are 2+ on the right side and 2+ on the left side.     Heart sounds: Normal heart sounds. No murmur heard.    No friction rub. No gallop.  Pulmonary:     Effort: Pulmonary effort is normal. No respiratory distress.     Breath sounds: Normal breath sounds.  Abdominal:     General: Bowel sounds are normal. There is no distension.     Palpations: Abdomen is soft. There is no mass.     Tenderness: There is abdominal tenderness in the right upper quadrant. There is no right CVA tenderness, left CVA tenderness or guarding.     Comments: Tender palpation right upper quadrant of the abdomen without guarding or rebound  Musculoskeletal:     Cervical back: Normal range of motion.     Right lower leg: Edema present.     Left lower leg: Edema present.     Comments: Palpable tenderness in the right paralumbar paraspinal muscles. Positive straight leg test on  the right  Skin:    General: Skin is warm and dry.  Neurological:     Mental Status: She is alert and oriented to person, place, and time.  Psychiatric:        Behavior: Behavior normal.     (all labs ordered are listed, but only abnormal results are displayed) Labs Reviewed  CBC WITH DIFFERENTIAL/PLATELET - Abnormal; Notable for the following components:      Result Value   RBC 3.79 (*)    Hemoglobin  11.5 (*)    HCT 35.8 (*)    All other components within normal limits  COMPREHENSIVE METABOLIC PANEL WITH GFR - Abnormal; Notable for the following components:   Glucose, Bld 52 (*)    Creatinine, Ser 0.34 (*)    Albumin 3.4 (*)    AST 86 (*)    ALT 143 (*)    Alkaline Phosphatase 197 (*)    All other components within normal limits  URINALYSIS, ROUTINE W REFLEX MICROSCOPIC - Abnormal; Notable for the following components:   APPearance CLOUDY (*)    Leukocytes,Ua LARGE (*)    All other components within normal limits  D-DIMER, QUANTITATIVE - Abnormal; Notable for the following components:   D-Dimer, Quant 1.58 (*)    All other components within normal limits  CBC - Abnormal; Notable for the following components:   RBC 3.51 (*)    Hemoglobin 10.8 (*)    HCT 33.2 (*)    All other components within normal limits  CBG MONITORING, ED - Abnormal; Notable for the following components:   Glucose-Capillary 33 (*)    All other components within normal limits  CBG MONITORING, ED - Abnormal; Notable for the following components:   Glucose-Capillary 101 (*)    All other components within normal limits  CULTURE, BLOOD (ROUTINE X 2)  CULTURE, BLOOD (ROUTINE X 2)  MRSA NEXT GEN BY PCR, NASAL  LIPASE, BLOOD  GLUCOSE, CAPILLARY  BRAIN NATRIURETIC PEPTIDE  COMPREHENSIVE METABOLIC PANEL WITH GFR  CBC  CBG MONITORING, ED    EKG: None  Radiology: MR ABDOMEN MRCP W WO CONTAST Result Date: 03/18/2024 CLINICAL DATA:  Cholelithiasis.  RIGHT upper quadrant pain. EXAM: MRI ABDOMEN  WITHOUT AND WITH CONTRAST (INCLUDING MRCP) TECHNIQUE: Multiplanar multisequence MR imaging of the abdomen was performed both before and after the administration of intravenous contrast. Heavily T2-weighted images of the biliary and pancreatic ducts were obtained, and three-dimensional MRCP images were rendered by post processing. CONTRAST:  7mL GADAVIST GADOBUTROL 1 MMOL/ML IV SOLN COMPARISON:  Ultrasound same day, CT chest same day FINDINGS: Lower chest:  Bibasilar effusions and atelectasis. Hepatobiliary: No intrahepatic or extrahepatic biliary duct dilatation. No filling defect within the common bile duct. The common hepatic duct measures 8 mm. The common bile duct measures 5 mm. Large ovoid stone within the proximal gallbladder measures 3.6 cm. No pericholecystic fluid. No gallbladder wall thickening. Pancreas: Normal pancreatic parenchymal intensity. No ductal dilatation or inflammation. Spleen: Normal spleen. Adrenals/urinary tract: Adrenal glands and kidneys are normal. Stomach/Bowel: Stomach and limited of the small bowel is unremarkable Vascular/Lymphatic: Abdominal aortic normal caliber. No retroperitoneal periportal lymphadenopathy. Musculoskeletal: No aggressive osseous lesion IMPRESSION: 1. Large gallstone without evidence acute cholecystitis. 2. No choledocholithiasis. 3. No biliary duct dilatation. 4. Normal pancreas. 5. Bibasilar effusions and atelectasis. Electronically Signed   By: Jackquline Boxer M.D.   On: 03/18/2024 15:51   MR 3D Recon At Scanner Result Date: 03/18/2024 CLINICAL DATA:  Cholelithiasis.  RIGHT upper quadrant pain. EXAM: MRI ABDOMEN WITHOUT AND WITH CONTRAST (INCLUDING MRCP) TECHNIQUE: Multiplanar multisequence MR imaging of the abdomen was performed both before and after the administration of intravenous contrast. Heavily T2-weighted images of the biliary and pancreatic ducts were obtained, and three-dimensional MRCP images were rendered by post processing. CONTRAST:  7mL  GADAVIST GADOBUTROL 1 MMOL/ML IV SOLN COMPARISON:  Ultrasound same day, CT chest same day FINDINGS: Lower chest:  Bibasilar effusions and atelectasis. Hepatobiliary: No intrahepatic or extrahepatic biliary duct dilatation. No filling defect within the common bile  duct. The common hepatic duct measures 8 mm. The common bile duct measures 5 mm. Large ovoid stone within the proximal gallbladder measures 3.6 cm. No pericholecystic fluid. No gallbladder wall thickening. Pancreas: Normal pancreatic parenchymal intensity. No ductal dilatation or inflammation. Spleen: Normal spleen. Adrenals/urinary tract: Adrenal glands and kidneys are normal. Stomach/Bowel: Stomach and limited of the small bowel is unremarkable Vascular/Lymphatic: Abdominal aortic normal caliber. No retroperitoneal periportal lymphadenopathy. Musculoskeletal: No aggressive osseous lesion IMPRESSION: 1. Large gallstone without evidence acute cholecystitis. 2. No choledocholithiasis. 3. No biliary duct dilatation. 4. Normal pancreas. 5. Bibasilar effusions and atelectasis. Electronically Signed   By: Jackquline Boxer M.D.   On: 03/18/2024 15:51   US  Venous Img Lower Unilateral Right Result Date: 03/18/2024 CLINICAL DATA:  Right lower extremity pain from the right hip to the right ankle. EXAM: RIGHT LOWER EXTREMITY VENOUS DOPPLER ULTRASOUND TECHNIQUE: Gray-scale sonography with graded compression, as well as color Doppler and duplex ultrasound were performed to evaluate the lower extremity deep venous systems from the level of the common femoral vein and including the common femoral, femoral, profunda femoral, popliteal and calf veins including the posterior tibial, peroneal and gastrocnemius veins when visible. The superficial great saphenous vein was also interrogated. Spectral Doppler was utilized to evaluate flow at rest and with distal augmentation maneuvers in the common femoral, femoral and popliteal veins. COMPARISON:  None Available. FINDINGS:  Contralateral Common Femoral Vein: Respiratory phasicity is normal and symmetric with the symptomatic side. No evidence of thrombus. Normal compressibility. Common Femoral Vein: No evidence of thrombus. Normal compressibility, respiratory phasicity and response to augmentation. Saphenofemoral Junction: No evidence of thrombus. Normal compressibility and flow on color Doppler imaging. Profunda Femoral Vein: No evidence of thrombus. Normal compressibility and flow on color Doppler imaging. Femoral Vein: No evidence of thrombus. Normal compressibility, respiratory phasicity and response to augmentation. Popliteal Vein: No evidence of thrombus. Normal compressibility, respiratory phasicity and response to augmentation. Calf Veins: No evidence of thrombus. Normal compressibility and flow on color Doppler imaging. Superficial Great Saphenous Vein: No evidence of thrombus. Normal compressibility. Venous Reflux:  None. Other Findings:  None. IMPRESSION: No evidence of deep venous thrombosis within the RIGHT lower extremity. Electronically Signed   By: Suzen Dials M.D.   On: 03/18/2024 14:42   CT Angio Chest PE W and/or Wo Contrast Result Date: 03/18/2024 EXAM: CTA CHEST 03/18/2024 12:56:00 PM TECHNIQUE: CTA of the chest was performed without and with the administration of 75 mL of intravenous contrast (iohexol  (OMNIPAQUE ) 350 MG/ML injection). Multiplanar reformatted images are provided for review. MIP images are provided for review. Automated exposure control, iterative reconstruction, and/or weight based adjustment of the mA/kV was utilized to reduce the radiation dose to as low as reasonably achievable. COMPARISON: 10/26/2023 CLINICAL HISTORY: Pulmonary embolism (PE) suspected, low to intermediate prob, positive D-dimer. PE suspected; Positive D-dimer; Omnipaque  350; 75 cc; bib [facility]cems from [facility] for RUQ pain x2 days that has progressively gotten worse. Hx of gallstones. C/o of nausea also. FINDINGS:  PULMONARY ARTERIES: Pulmonary arteries are adequately opacified for evaluation. No acute pulmonary embolus. Main pulmonary artery is normal in caliber. MEDIASTINUM: The heart and pericardium demonstrate multivessel coronary artery calcifications. There is aortic atherosclerosis. No acute abnormality of the thoracic aorta. LYMPH NODES: Enlarged mediastinal and bilateral hilar lymph nodes are identified. For example, right hilar node measures 1.7 cm (image 127/5), previously 1.5 cm. Left paratracheal lymph node measures 1.7 cm, previously 1 cm. Subcarinal node measures 1.6 cm (image 134/5), previously 1.4 cm. LUNGS  AND PLEURA: Small bilateral pleural effusions, left greater than right. No pneumothorax identified. Diffuse interlobular septal thickening with ground glass attenuation noted throughout both lungs compatible with pulmonary edema. Bilateral lower lobe airspace consolidation concerning for pneumonia or aspiration. Persistent non-solid nodule within the left upper lobe containing ground glass and cystic components is stable measuring 1.3 cm (image 58/6). UPPER ABDOMEN: Large calcified stone within the gallbladder neck measures 3.7 cm. SOFT TISSUES AND BONES: No acute bone or soft tissue abnormality. No acute or suspicious osseous findings. IMPRESSION: 1. No evidence of pulmonary embolism. 2. Bilateral pleural effusions and moderate diffuse pulmonary edema. 3. Bilateral lower lobe airspace consolidation concerning for pneumonia or aspiration. 4. Enlarged mediastinal and bilateral hilar lymph nodes, including right hilar (1.7 cm), left paratracheal (1.7 cm), and subcarinal (1.6 cm) nodes. Likely reactive in the setting of chf and pneumonia. 5. Large calcified gallstone within the gallbladder neck measuring 3.7 cm. 6. Aortic atherosclerosis and multivessel coronary artery calcifications. Electronically signed by: Waddell Calk MD 03/18/2024 01:35 PM EDT RP Workstation: [id number]   US  ABDOMEN LIMITED RUQ  (LIVER/GB) Result Date: 03/18/2024 EXAM: Right Upper Quadrant Abdominal Ultrasound 03/18/2024 12:29:49 PM TECHNIQUE: Real-time ultrasonography of the right upper quadrant of the abdomen was performed. COMPARISON: CT of the abdomen and pelvis from earlier today. CLINICAL HISTORY: RUQ abdominal pain. FINDINGS: LIVER: Increased parenchymal echogenicity of the liver. The portal vein is patent with normal flow towards the liver. No intrahepatic biliary ductal dilatation. No evidence of mass. BILIARY SYSTEM: The gallbladder wall measures 3.5 mm in thickness. There is a large shadowing stone within the gallbladder neck measuring 3.4 cm. No pericholecystic fluid or sonographic Murphy's sign. The common bile duct is increasing caliber measuring 9 mm. No stones identified within the visualized proximal portion of the CBD. RIGHT KIDNEY: The right kidney is grossly unremarkable in appearances without evidence of hydronephrosis, echogenic calculi or worrisome mass lesions. PANCREAS: The pancreas is not well visualized. OTHER: Small right pleural effusion is incidentally noted. No right upper quadrant ascites. IMPRESSION: 1. Large shadowing gallstone within the gallbladder neck measuring 3.4 cm. The gallbladder wall is mildly thickened, measuring 3.5 mm. No pericholecystic fluid or sonographic murphy sign. 2. Mildly dilated common bile duct measuring 9 mm without visible choledocholithiasis in the visualized proximal segment; correlate with liver function tests and consider further evaluation with MRCP if clinically indicated. 3. Increased hepatic parenchymal echogenicity, compatible with hepatic steatosis. 4. Small right pleural effusion incidentally noted. Electronically signed by: Waddell Calk MD 03/18/2024 12:51 PM EDT RP Workstation: [id number]   CT Renal Stone Study Result Date: 03/18/2024 CLINICAL DATA:  Abdominal/flank pain, stone suspected EXAM: CT ABDOMEN AND PELVIS WITHOUT CONTRAST TECHNIQUE: Multidetector CT  imaging of the abdomen and pelvis was performed following the standard protocol without IV contrast. RADIATION DOSE REDUCTION: This exam was performed according to the departmental dose-optimization program which includes automated exposure control, adjustment of the mA and/or kV according to patient size and/or use of iterative reconstruction technique. COMPARISON:  January 24, 2024 FINDINGS: Evaluation is limited by lack of IV contrast. Lower chest: Small bilateral pleural effusions with bibasilar heterogeneous airspace opacities and bronchial wall thickening. Mild interlobular septal thickening. Hepatobiliary: Large cholelithiasis. The gallbladder is distended. No definitive pericholecystic fluid. There is a single with area of fat stranding along the inferior margin of the gallbladder, nonspecific (series 5, image 35). Fluid. No extrahepatic biliary ductal dilation. Periportal edema. Pancreas: No new peripancreatic fat stranding. Relatively atrophic appearance of the pancreas. Spleen: Unremarkable. Adrenals/Urinary Tract: Adrenal  glands are unremarkable. No hydronephrosis. No obstructing nephrolithiasis. Circumferential wall thickening with faint adjacent fat stranding of the bladder. Stomach/Bowel: No evidence of bowel obstruction. Appendix is unremarkable. Stomach is decompressed, limiting evaluation. Favor small hiatal hernia. Vascular/Lymphatic: Atherosclerotic calcifications of the nonaneurysmal abdominal aorta. Prominent retroperitoneal lymph nodes with representative LEFT periaortic lymph node measuring 9 mm in the short axis, similar compared to prior (series 2, image 28). Perigastric lymph node measuring 12 mm in the short axis, previously 11 mm (series 2, image 15). Aortocaval node measuring 8 mm in the short axis, unchanged (series 2, image 29). Representative RIGHT inguinal lymph node measures 13 mm in the short axis, similar compared to prior (series 2, image 86). Reproductive: Status post  hysterectomy. No adnexal masses. Other: Trace free fluid in the pelvis.  Anasarca. Musculoskeletal: Advanced degenerative changes of bilateral hips. Linear horizontal lucency traversing the RIGHT sacrum (series 5, image 85). IMPRESSION: 1. No hydronephrosis or obstructing nephrolithiasis. 2. Circumferential wall thickening of the bladder with faint adjacent fat stranding. Recommend correlation with urinalysis to exclude cystitis. 3. Large cholelithiasis with distended gallbladder. There is a single focus of fat stranding along the inferior margin of the gallbladder, nonspecific in a background of anasarca and favored to be incidental. If there is persistent clinical concern for acute cholecystitis, HIDA scan would be the modality of choice for further evaluation. 4. Small bilateral pleural effusions with bibasilar heterogeneous airspace opacities and bronchial wall thickening. Findings are favored to reflect pulmonary edema with superimposed atelectasis. 5. Linear horizontal lucency traversing the RIGHT sacrum. This could reflect a nondisplaced fracture versus prominent nutrient foramen. Recommend correlation with point tenderness. 6. Prominent retroperitoneal and inguinal adenopathy, nonspecific and possibly reactive. Aortic Atherosclerosis (ICD10-I70.0). Electronically Signed   By: Corean Salter M.D.   On: 03/18/2024 12:07   DG Chest Port 1 View Result Date: 03/18/2024 EXAM: 1 VIEW(S) XRAY OF THE CHEST 03/18/2024 11:00:00 AM COMPARISON: 02/14/2024 CLINICAL HISTORY: hypoxia. Per chart; Pt bib ccems from [facility] for RUQ pain x2 days that has progressively gotten worse. Hx of gallstones. C/o of nausea also. FINDINGS: LUNGS AND PLEURA: Small bilateral pleural effusions. Mild interstitial edema. Bibasilar atelectasis. No focal pulmonary opacity. No pneumothorax. HEART AND MEDIASTINUM: No acute abnormality of the cardiac and mediastinal silhouettes. BONES AND SOFT TISSUES: No acute osseous abnormality.  IMPRESSION: 1. Small bilateral pleural effusions. 2. Mild interstitial edema. 3. Bibasilar atelectasis. Electronically signed by: Waddell Calk MD 03/18/2024 11:13 AM EDT RP Workstation: [id number]     .Critical Care  Performed by: Arloa Chroman, PA-C Authorized by: Arloa Chroman, PA-C   Critical care provider statement:    Critical care time (minutes):  85   Critical care time was exclusive of:  Separately billable procedures and treating other patients   Critical care was necessary to treat or prevent imminent or life-threatening deterioration of the following conditions:  Respiratory failure   Critical care was time spent personally by me on the following activities:  Development of treatment plan with patient or surrogate, discussions with consultants, evaluation of patient's response to treatment, examination of patient, ordering and review of laboratory studies, ordering and review of radiographic studies, ordering and performing treatments and interventions, pulse oximetry, re-evaluation of patient's condition, review of old charts, interpretation of cardiac output measurements and obtaining history from patient or surrogate    Medications Ordered in the ED  metroNIDAZOLE  (FLAGYL ) IVPB 500 mg (500 mg Intravenous New Bag/Given 03/18/24 1653)  furosemide  (LASIX ) injection 20 mg (20 mg Intravenous Given 03/18/24  1614)  insulin  aspart (novoLOG ) injection 0-9 Units ( Subcutaneous Not Given 03/18/24 1640)  heparin injection 5,000 Units (has no administration in time range)  acetaminophen  (TYLENOL ) tablet 650 mg (650 mg Oral Given 03/18/24 1649)    Or  acetaminophen  (TYLENOL ) suppository 650 mg ( Rectal See Alternative 03/18/24 1649)  ketorolac  (TORADOL ) 30 MG/ML injection 30 mg (has no administration in time range)  ondansetron  (ZOFRAN ) tablet 4 mg (has no administration in time range)    Or  ondansetron  (ZOFRAN ) injection 4 mg (has no administration in time range)  pantoprazole (PROTONIX)  EC tablet 40 mg (40 mg Oral Given 03/18/24 1649)  ceFEPIme (MAXIPIME) 2 g in sodium chloride  0.9 % 100 mL IVPB (has no administration in time range)  vancomycin (VANCOCIN) IVPB 1000 mg/200 mL premix (has no administration in time range)  hydrOXYzine  (ATARAX ) tablet 10 mg (10 mg Oral Given 03/18/24 1649)  nicotine  (NICODERM CQ  - dosed in mg/24 hours) patch 21 mg (21 mg Transdermal Patch Applied 03/18/24 1650)  fentaNYL  (SUBLIMAZE ) injection 12.5 mcg (has no administration in time range)  iohexol  (OMNIPAQUE ) 350 MG/ML injection 75 mL (75 mLs Intravenous Contrast Given 03/18/24 1245)  dextrose  50 % solution 25 g (25 g Intravenous Given 03/18/24 1329)  ceFEPIme (MAXIPIME) 2 g in sodium chloride  0.9 % 100 mL IVPB (2 g Intravenous New Bag/Given 03/18/24 1611)  vancomycin (VANCOREADY) IVPB 1500 mg/300 mL (1,500 mg Intravenous New Bag/Given 03/18/24 1612)  gadobutrol (GADAVIST) 1 MMOL/ML injection 7 mL (7 mLs Intravenous Contrast Given 03/18/24 1518)    Clinical Course as of 03/18/24 1819  Sun Mar 18, 2024  1310 US  ABDOMEN LIMITED RUQ (LIVER/GB) [AH]  1310 CT Renal Stone Study I visualized and interpreted abdominal ultrasound and CT renal stone study.  Patient's bladder appears to be thickened.  Urine appears infected when I review labs.  She has yeast present in previous pyelonephritis was secondary to candidal urinary infection. [AH]  1311 DG Chest Port 1 View Visualized interpreted 1 view chest x-ray which shows bilateral pleural effusions [AH]  1313 AST(!): 86 [AH]  1313 ALT(!): 143 [AH]  1313 Alkaline Phosphatase(!): 197 I reviewed the patient's complete metabolic panel.  AST, ALT, alkaline phosphatase all elevated.  Previous values on 01/25/2024 were negative. [AH]  1314 Urinalysis, Routine w reflex microscopic -Urine, Clean Catch(!) [AH]  1411 CT Angio Chest PE W and/or Wo Contrast I visualized and interpreted CT angiogram of the chest which shows bilateral consolidations concerning for pneumonia, no  pulmonary embolus.  Bilateral Neri edema and pleural effusions. [AH]    Clinical Course User Index [AH] Arloa Chroman, PA-C                                 Medical Decision Making Amount and/or Complexity of Data Reviewed Labs: ordered. Decision-making details documented in ED Course. Radiology: ordered. Decision-making details documented in ED Course.  Risk Prescription drug management. Decision regarding hospitalization.   This patient presents to the ED for concern of flank pain, this involves an extensive number of treatment options, and is a complaint that carries with it a high risk of complications and morbidity.   Differential diagnosis includes right lower lobe pneumonia, acute biliary disease, hepatitis, pyelonephritis, renal colic, ovarian torsion less likely.  Symptoms also concerning for potential lower lung infarct and PE with DVT given recent hospitalization.  Also considered sciatica as she has a positive straight leg test on the right.  Co morbidities:    has a past medical history of Abnormal Pap smear, Bronchitis, Depression, Diabetes (HCC), GERD (gastroesophageal reflux disease), Headache(784.0), and Polysubstance abuse (HCC).   Social Determinants of Health:   SDOH Screenings   Food Insecurity: No Food Insecurity (03/18/2024)  Recent Concern: Food Insecurity - Food Insecurity Present (01/24/2024)  Housing: High Risk (03/18/2024)  Transportation Needs: Unmet Transportation Needs (03/18/2024)  Utilities: At Risk (03/18/2024)  Financial Resource Strain: Not on File (10/08/2021)   [facility]  Physical Activity: Not on File (10/08/2021)   Received from [facility]  Social Connections: Socially Isolated (03/18/2024)  Stress: Not on File (10/08/2021)   Received from [facility]  Tobacco Use: High Risk (03/18/2024)     Additional history:  {Additional history obtained from review of EMR {External records from outside source obtained and reviewed including bedside  MAR provided by skilled nursing facility  Lab Tests:  I Ordered, and personally interpreted labs.  The pertinent results include:   As per ED course  Imaging Studies:  As per ED course     Test Considered:  MRCP however this can be done inpatient  Critical Interventions:    Consultations Obtained: Dr. Vicci  Problem List / ED Course:     ICD-10-CM   1. HCAP (healthcare-associated pneumonia)  J18.9     2. Acute pulmonary edema (HCC)  J81.0     3. Acute respiratory failure with hypoxia (HCC)  J96.01     4. Elevated LFTs  R79.89     5. Pain  R52 MR 3D Recon At Scanner    MR 3D Recon At Scanner    6. Calculus of gallbladder without cholecystitis without obstruction  K80.20       MDM: This is a 58 year old female with a tensive history presenting from skilled nursing facility with flank pain leg pain back pain.  After review of all data points patient appears to have healthcare associated pneumonia, pulmonary edema with mild ascites on CT abdomen, bilateral lower extremity edema likely require some diuresis.  Patient also has acute hypoxic respiratory failure requiring 4 L, large gallstone.  Patient will require admission for further evaluation will likely need MRCP potential GI consult.   Dispostion:  After consideration of the diagnostic results and the patients response to treatment, I feel that the patent would benefit from admission.      Final diagnoses:  HCAP (healthcare-associated pneumonia)  Acute pulmonary edema (HCC)  Acute respiratory failure with hypoxia (HCC)  Elevated LFTs  Calculus of gallbladder without cholecystitis without obstruction    ED Discharge Orders     None          Arloa Chroman, PA-C 03/18/24 1820    Franklyn Sid SAILOR, MD 03/19/24 1626

## 2024-03-18 NOTE — H&P (Addendum)
 History and Physical  [facility]  Nancy [name] FMW:[id number] DOB: 1966-02-17 DOA: 03/18/2024  PCP: Earvin Johnston PARAS, FNP  Patient coming from: [facility]  Level of care: Med-Surg  I have personally briefly reviewed patient's old medical records in [facility]  Chief Complaint: RUQ abdominal pain   HPI: Nancy [name] is a 58 year old female with history of alcohol use disorder, detoxed approximately 1 month ago and currently residing at [place] [facility], recently discharged from [facility] family medicine service 1 month ago after being treated for pyelonephritis, past medical history significant for hepatic steatosis, substance abuse, currently on Suboxone, poorly controlled type 2 diabetes mellitus with hyperglycemia, osteoarthritis of the hips, hypoxia, history of DKA, depression and anxiety, chronic constipation who was brought in by [facility] from [place] [facility] for 2 days of colicky right upper quadrant abdominal pain that is progressively worsened.  Patient reportedly has a history of cholelithiasis.  Patient has complained of nausea but no emesis.  Patient reports she had a bout of severe flank pain on the right side that radiated down into her back and right leg.  Patient denies chest pain and shortness of breath.  Patient denies dysuria.  Patient denies fever and chills.  On arrival to the ED she was noted to be hypoxic with a pulse ox of 84% on room air.  Her workup in the ED significant for hypoglycemia, blood sugar 33, CTA chest PE with findings of bilateral pleural effusions and moderate bilateral pulmonary edema, no PE found, bilateral lower lobe airspace disease suggesting pneumonia and/or aspiration.  Abdominal ultrasound with findings of large gallstone within the gallbladder neck measuring 3.4 cm, Mildly dilated common bile duct measuring 9 mm without visible choledocholithiasis in the visualized proximal segment, gallbladder enlargement, bladder wall  thickening with concern for cystitis, hepatic steatosis.  She was started on IV antibiotics for pneumonia and admission requested for further management.     Past Medical History:  Diagnosis Date   Abnormal Pap smear    Bronchitis    Depression    Diabetes (HCC)    GERD (gastroesophageal reflux disease)    tums prn   Headache(784.0)    Polysubstance abuse (HCC)     Past Surgical History:  Procedure Laterality Date   CERVICAL CONIZATION W/BX  12/13/2011   Procedure: CONIZATION CERVIX WITH BIOPSY;  Surgeon: Harland JAYSON Birkenhead, MD;  Location: [facility];  Service: Gynecology;  Laterality: N/A;   DENTAL SURGERY       reports that she has been smoking cigarettes. She has a 20 pack-year smoking history. She has never used smokeless tobacco. She reports current alcohol use of about 7.0 standard drinks of alcohol per week. She reports that she does not currently use drugs after having used the following drugs: Crack cocaine and Marijuana.  Allergies  Allergen Reactions   Glucophage  [Metformin ] Diarrhea    Family History  Problem Relation Age of Onset   Cancer Mother        unsure    Prior to Admission medications   Medication Sig Start Date End Date Taking? Authorizing Provider  Accu-Chek Softclix Lancets lancets Use as instructed 01/04/24   Doretha Folks, MD  acetaminophen  (TYLENOL ) 500 MG tablet Take 2 tablets (1,000 mg total) by mouth every 6 (six) hours as needed. 02/14/24   Howell Lunger, DO  albuterol  (VENTOLIN  HFA) 108 (90 Base) MCG/ACT inhaler Inhale 1-2 puffs into the lungs every 4 (four) hours as needed for wheezing or  shortness of breath. 10/27/23   Patsy Lenis, MD  atorvastatin  (LIPITOR) 40 MG tablet Take 1 tablet (40 mg total) by mouth daily. 10/28/23   Patsy Lenis, MD  DULoxetine  (CYMBALTA ) 60 MG capsule Take 1 capsule (60 mg total) by mouth daily. 02/14/24   Howell Lunger, DO  gabapentin  (NEURONTIN ) 300 MG capsule Take 2 capsules (600 mg total) by mouth every 8 (eight)  hours. 02/14/24   Howell Lunger, DO  Glucose Blood (BLOOD GLUCOSE TEST STRIPS) STRP Use to check blood sugar 4 (four) times daily -  before meals and at bedtime. 01/04/24   Doretha Folks, MD  hydrOXYzine  (ATARAX ) 25 MG tablet Take 1 tablet (25 mg total) by mouth 3 (three) times daily as needed for anxiety. 02/14/24   Howell Lunger, DO  insulin  aspart (NOVOLOG ) 100 UNIT/ML injection Inject 18 Units into the skin 3 (three) times daily with meals. 02/14/24   Howell Lunger, DO  insulin  aspart (NOVOLOG ) 100 UNIT/ML injection Inject 0-15 Units into the skin 3 (three) times daily with meals. 02/16/24   Howell Lunger, DO  insulin  glargine (LANTUS ) 100 UNIT/ML injection Inject 0.4 mLs (40 Units total) into the skin 2 (two) times daily. 02/16/24   Howell Lunger, DO  insulin  glargine (LANTUS ) 100 UNIT/ML Solostar Pen Inject 55 Units into the skin daily. 02/14/24 03/15/24  Howell Lunger, DO  Insulin  Pen Needle (PEN NEEDLES) 31G X 5 MM MISC Use with insulin  4 (four) times daily -  before meals and at bedtime. 10/27/23   Patsy Lenis, MD  leptospermum manuka honey (MEDIHONEY) PSTE paste Apply 1 Application topically daily. 02/14/24   Howell Lunger, DO  lidocaine  (LIDODERM ) 5 % Place 1 patch onto the skin daily. Remove & Discard patch within 12 hours or as directed by MD 02/14/24   Howell Lunger, DO  methocarbamol  (ROBAXIN ) 750 MG tablet Take 1 tablet (750 mg total) by mouth every 6 (six) hours as needed for muscle spasms. 02/14/24   Howell Lunger, DO  Multiple Vitamin (MULTIVITAMIN WITH MINERALS) TABS tablet Take 1 tablet by mouth daily. 02/14/24   Howell Lunger, DO  nicotine  (NICODERM CQ  - DOSED IN MG/24 HOURS) 14 mg/24hr patch Place 1 patch (14 mg total) onto the skin daily. 02/15/24   Howell Lunger, DO  Nystatin  (GERHARDT'S BUTT CREAM) CREA Apply 1 Application topically 2 (two) times daily. 02/14/24   Howell Lunger, DO  thiamine  (VITAMIN B-1) 100 MG tablet Take 1 tablet (100 mg total) by mouth  daily. 02/14/24   Howell Lunger, DO    Physical Exam: Vitals:   03/18/24 1041 03/18/24 1345 03/18/24 1400 03/18/24 1415  BP:   92/63 93/64  Pulse:   81 81  Resp:   16 13  Temp:      TempSrc:      SpO2:  92% (!) 89% (!) 88%  Weight: 68 kg     Height: 5' 9 (1.753 m)       Constitutional: NAD, calm, comfortable Eyes: PERRL, lids and conjunctivae normal ENMT: Mucous membranes are moist. Posterior pharynx clear of any exudate or lesions.Normal dentition.  Neck: normal, supple, no masses, no thyromegaly Respiratory: clear to auscultation bilaterally, no wheezing, no crackles. Normal respiratory effort. No accessory muscle use.  Cardiovascular: normal s1, s2 sounds, no murmurs / rubs / gallops. No extremity edema. 2+ pedal pulses. No carotid bruits.  Abdomen: no tenderness, no masses palpated. No hepatosplenomegaly. Bowel sounds positive.  Musculoskeletal: no clubbing / cyanosis. No joint deformity upper and lower extremities. Good ROM, no  contractures. Normal muscle tone.  Skin: no rashes, lesions, ulcers. No induration Neurologic: CN 2-12 grossly intact. Sensation intact, DTR normal. Strength 5/5 in all 4.  Psychiatric: Normal judgment and insight. Alert and oriented x 3. Normal mood.   Labs on Admission: I have personally reviewed following labs and imaging studies  CBC: Recent Labs  Lab 03/18/24 1114  WBC 9.3  NEUTROABS 6.1  HGB 11.5*  HCT 35.8*  MCV 94.5  PLT 307   Basic Metabolic Panel: Recent Labs  Lab 03/18/24 1114  NA 136  K 4.0  CL 98  CO2 27  GLUCOSE 52*  BUN 11  CREATININE 0.34*  CALCIUM  9.2   GFR: Estimated Creatinine Clearance: 81.1 mL/min (A) (by C-G formula based on SCr of 0.34 mg/dL (L)). Liver Function Tests: Recent Labs  Lab 03/18/24 1114  AST 86*  ALT 143*  ALKPHOS 197*  BILITOT 0.4  PROT 7.0  ALBUMIN 3.4*   Recent Labs  Lab 03/18/24 1114  LIPASE 29   No results for input(s): AMMONIA in the last 168 hours. Coagulation  Profile: No results for input(s): INR, PROTIME in the last 168 hours. Cardiac Enzymes: No results for input(s): CKTOTAL, CKMB, CKMBINDEX, TROPONINI in the last 168 hours. BNP (last 3 results) No results for input(s): PROBNP in the last 8760 hours. HbA1C: No results for input(s): HGBA1C in the last 72 hours. CBG: Recent Labs  Lab 03/18/24 1320 03/18/24 1358  GLUCAP 33* 101*   Lipid Profile: No results for input(s): CHOL, HDL, LDLCALC, TRIG, CHOLHDL, LDLDIRECT in the last 72 hours. Thyroid  Function Tests: No results for input(s): TSH, T4TOTAL, FREET4, T3FREE, THYROIDAB in the last 72 hours. Anemia Panel: No results for input(s): VITAMINB12, FOLATE, FERRITIN, TIBC, IRON, RETICCTPCT in the last 72 hours. Urine analysis:    Component Value Date/Time   COLORURINE YELLOW 03/18/2024 1122   APPEARANCEUR CLOUDY (A) 03/18/2024 1122   LABSPEC 1.011 03/18/2024 1122   PHURINE 5.0 03/18/2024 1122   GLUCOSEU NEGATIVE 03/18/2024 1122   HGBUR NEGATIVE 03/18/2024 1122   BILIRUBINUR NEGATIVE 03/18/2024 1122   KETONESUR NEGATIVE 03/18/2024 1122   PROTEINUR NEGATIVE 03/18/2024 1122   UROBILINOGEN 0.2 02/11/2012 0858   NITRITE NEGATIVE 03/18/2024 1122   LEUKOCYTESUR LARGE (A) 03/18/2024 1122    Radiological Exams on Admission: US  Venous Img Lower Unilateral Right Result Date: 03/18/2024 CLINICAL DATA:  Right lower extremity pain from the right hip to the right ankle. EXAM: RIGHT LOWER EXTREMITY VENOUS DOPPLER ULTRASOUND TECHNIQUE: Gray-scale sonography with graded compression, as well as color Doppler and duplex ultrasound were performed to evaluate the lower extremity deep venous systems from the level of the common femoral vein and including the common femoral, femoral, profunda femoral, popliteal and calf veins including the posterior tibial, peroneal and gastrocnemius veins when visible. The superficial great saphenous vein was also interrogated.  Spectral Doppler was utilized to evaluate flow at rest and with distal augmentation maneuvers in the common femoral, femoral and popliteal veins. COMPARISON:  None Available. FINDINGS: Contralateral Common Femoral Vein: Respiratory phasicity is normal and symmetric with the symptomatic side. No evidence of thrombus. Normal compressibility. Common Femoral Vein: No evidence of thrombus. Normal compressibility, respiratory phasicity and response to augmentation. Saphenofemoral Junction: No evidence of thrombus. Normal compressibility and flow on color Doppler imaging. Profunda Femoral Vein: No evidence of thrombus. Normal compressibility and flow on color Doppler imaging. Femoral Vein: No evidence of thrombus. Normal compressibility, respiratory phasicity and response to augmentation. Popliteal Vein: No evidence of thrombus. Normal compressibility, respiratory  phasicity and response to augmentation. Calf Veins: No evidence of thrombus. Normal compressibility and flow on color Doppler imaging. Superficial Great Saphenous Vein: No evidence of thrombus. Normal compressibility. Venous Reflux:  None. Other Findings:  None. IMPRESSION: No evidence of deep venous thrombosis within the RIGHT lower extremity. Electronically Signed   By: Suzen Dials M.D.   On: 03/18/2024 14:42   CT Angio Chest PE W and/or Wo Contrast Result Date: 03/18/2024 EXAM: CTA CHEST 03/18/2024 12:56:00 PM TECHNIQUE: CTA of the chest was performed without and with the administration of 75 mL of intravenous contrast (iohexol  (OMNIPAQUE ) 350 MG/ML injection). Multiplanar reformatted images are provided for review. MIP images are provided for review. Automated exposure control, iterative reconstruction, and/or weight based adjustment of the mA/kV was utilized to reduce the radiation dose to as low as reasonably achievable. COMPARISON: 10/26/2023 CLINICAL HISTORY: Pulmonary embolism (PE) suspected, low to intermediate prob, positive D-dimer. PE  suspected; Positive D-dimer; Omnipaque  350; 75 cc; bib ccems from [facility] for RUQ pain x2 days that has progressively gotten worse. Hx of gallstones. C/o of nausea also. FINDINGS: PULMONARY ARTERIES: Pulmonary arteries are adequately opacified for evaluation. No acute pulmonary embolus. Main pulmonary artery is normal in caliber. MEDIASTINUM: The heart and pericardium demonstrate multivessel coronary artery calcifications. There is aortic atherosclerosis. No acute abnormality of the thoracic aorta. LYMPH NODES: Enlarged mediastinal and bilateral hilar lymph nodes are identified. For example, right hilar node measures 1.7 cm (image 127/5), previously 1.5 cm. Left paratracheal lymph node measures 1.7 cm, previously 1 cm. Subcarinal node measures 1.6 cm (image 134/5), previously 1.4 cm. LUNGS AND PLEURA: Small bilateral pleural effusions, left greater than right. No pneumothorax identified. Diffuse interlobular septal thickening with ground glass attenuation noted throughout both lungs compatible with pulmonary edema. Bilateral lower lobe airspace consolidation concerning for pneumonia or aspiration. Persistent non-solid nodule within the left upper lobe containing ground glass and cystic components is stable measuring 1.3 cm (image 58/6). UPPER ABDOMEN: Large calcified stone within the gallbladder neck measures 3.7 cm. SOFT TISSUES AND BONES: No acute bone or soft tissue abnormality. No acute or suspicious osseous findings. IMPRESSION: 1. No evidence of pulmonary embolism. 2. Bilateral pleural effusions and moderate diffuse pulmonary edema. 3. Bilateral lower lobe airspace consolidation concerning for pneumonia or aspiration. 4. Enlarged mediastinal and bilateral hilar lymph nodes, including right hilar (1.7 cm), left paratracheal (1.7 cm), and subcarinal (1.6 cm) nodes. Likely reactive in the setting of chf and pneumonia. 5. Large calcified gallstone within the gallbladder neck measuring 3.7 cm. 6. Aortic  atherosclerosis and multivessel coronary artery calcifications. Electronically signed by: Waddell Calk MD 03/18/2024 01:35 PM EDT RP Workstation: [id number]   US  ABDOMEN LIMITED RUQ (LIVER/GB) Result Date: 03/18/2024 EXAM: Right Upper Quadrant Abdominal Ultrasound 03/18/2024 12:29:49 PM TECHNIQUE: Real-time ultrasonography of the right upper quadrant of the abdomen was performed. COMPARISON: CT of the abdomen and pelvis from earlier today. CLINICAL HISTORY: RUQ abdominal pain. FINDINGS: LIVER: Increased parenchymal echogenicity of the liver. The portal vein is patent with normal flow towards the liver. No intrahepatic biliary ductal dilatation. No evidence of mass. BILIARY SYSTEM: The gallbladder wall measures 3.5 mm in thickness. There is a large shadowing stone within the gallbladder neck measuring 3.4 cm. No pericholecystic fluid or sonographic Murphy's sign. The common bile duct is increasing caliber measuring 9 mm. No stones identified within the visualized proximal portion of the CBD. RIGHT KIDNEY: The right kidney is grossly unremarkable in appearances without evidence of hydronephrosis, echogenic calculi or worrisome mass  lesions. PANCREAS: The pancreas is not well visualized. OTHER: Small right pleural effusion is incidentally noted. No right upper quadrant ascites. IMPRESSION: 1. Large shadowing gallstone within the gallbladder neck measuring 3.4 cm. The gallbladder wall is mildly thickened, measuring 3.5 mm. No pericholecystic fluid or sonographic murphy sign. 2. Mildly dilated common bile duct measuring 9 mm without visible choledocholithiasis in the visualized proximal segment; correlate with liver function tests and consider further evaluation with MRCP if clinically indicated. 3. Increased hepatic parenchymal echogenicity, compatible with hepatic steatosis. 4. Small right pleural effusion incidentally noted. Electronically signed by: Waddell Calk MD 03/18/2024 12:51 PM EDT RP Workstation:  [id number]   CT Renal Stone Study Result Date: 03/18/2024 CLINICAL DATA:  Abdominal/flank pain, stone suspected EXAM: CT ABDOMEN AND PELVIS WITHOUT CONTRAST TECHNIQUE: Multidetector CT imaging of the abdomen and pelvis was performed following the standard protocol without IV contrast. RADIATION DOSE REDUCTION: This exam was performed according to the departmental dose-optimization program which includes automated exposure control, adjustment of the mA and/or kV according to patient size and/or use of iterative reconstruction technique. COMPARISON:  January 24, 2024 FINDINGS: Evaluation is limited by lack of IV contrast. Lower chest: Small bilateral pleural effusions with bibasilar heterogeneous airspace opacities and bronchial wall thickening. Mild interlobular septal thickening. Hepatobiliary: Large cholelithiasis. The gallbladder is distended. No definitive pericholecystic fluid. There is a single with area of fat stranding along the inferior margin of the gallbladder, nonspecific (series 5, image 35). Fluid. No extrahepatic biliary ductal dilation. Periportal edema. Pancreas: No new peripancreatic fat stranding. Relatively atrophic appearance of the pancreas. Spleen: Unremarkable. Adrenals/Urinary Tract: Adrenal glands are unremarkable. No hydronephrosis. No obstructing nephrolithiasis. Circumferential wall thickening with faint adjacent fat stranding of the bladder. Stomach/Bowel: No evidence of bowel obstruction. Appendix is unremarkable. Stomach is decompressed, limiting evaluation. Favor small hiatal hernia. Vascular/Lymphatic: Atherosclerotic calcifications of the nonaneurysmal abdominal aorta. Prominent retroperitoneal lymph nodes with representative LEFT periaortic lymph node measuring 9 mm in the short axis, similar compared to prior (series 2, image 28). Perigastric lymph node measuring 12 mm in the short axis, previously 11 mm (series 2, image 15). Aortocaval node measuring 8 mm in the short axis,  unchanged (series 2, image 29). Representative RIGHT inguinal lymph node measures 13 mm in the short axis, similar compared to prior (series 2, image 86). Reproductive: Status post hysterectomy. No adnexal masses. Other: Trace free fluid in the pelvis.  Anasarca. Musculoskeletal: Advanced degenerative changes of bilateral hips. Linear horizontal lucency traversing the RIGHT sacrum (series 5, image 85). IMPRESSION: 1. No hydronephrosis or obstructing nephrolithiasis. 2. Circumferential wall thickening of the bladder with faint adjacent fat stranding. Recommend correlation with urinalysis to exclude cystitis. 3. Large cholelithiasis with distended gallbladder. There is a single focus of fat stranding along the inferior margin of the gallbladder, nonspecific in a background of anasarca and favored to be incidental. If there is persistent clinical concern for acute cholecystitis, HIDA scan would be the modality of choice for further evaluation. 4. Small bilateral pleural effusions with bibasilar heterogeneous airspace opacities and bronchial wall thickening. Findings are favored to reflect pulmonary edema with superimposed atelectasis. 5. Linear horizontal lucency traversing the RIGHT sacrum. This could reflect a nondisplaced fracture versus prominent nutrient foramen. Recommend correlation with point tenderness. 6. Prominent retroperitoneal and inguinal adenopathy, nonspecific and possibly reactive. Aortic Atherosclerosis (ICD10-I70.0). Electronically Signed   By: Corean Salter M.D.   On: 03/18/2024 12:07   DG Chest Port 1 View Result Date: 03/18/2024 EXAM: 1 VIEW(S) XRAY OF THE CHEST  03/18/2024 11:00:00 AM COMPARISON: 02/14/2024 CLINICAL HISTORY: hypoxia. Per chart; Pt bib ccems from [facility] for RUQ pain x2 days that has progressively gotten worse. Hx of gallstones. C/o of nausea also. FINDINGS: LUNGS AND PLEURA: Small bilateral pleural effusions. Mild interstitial edema. Bibasilar atelectasis. No  focal pulmonary opacity. No pneumothorax. HEART AND MEDIASTINUM: No acute abnormality of the cardiac and mediastinal silhouettes. BONES AND SOFT TISSUES: No acute osseous abnormality. IMPRESSION: 1. Small bilateral pleural effusions. 2. Mild interstitial edema. 3. Bibasilar atelectasis. Electronically signed by: Waddell Calk MD 03/18/2024 11:13 AM EDT RP Workstation: [id number]    Assessment/Plan Principal Problem:   Acute respiratory failure with hypoxia (HCC) Active Problems:   Colicky RUQ abdominal pain   S/P alcohol detoxification   Hyperlipidemia   Uncontrolled type 2 diabetes mellitus with hyperglycemia (HCC)   Normocytic anemia   OA (osteoarthritis) of hip   Type 2 diabetes mellitus with diabetic neuropathy, unspecified (HCC)   Protein-calorie malnutrition, severe   Hypoxia   Cholelithiasis   Hypoglycemia   Elevated transaminase level    Colicky RUQ abdominal pain -- Patient presenting with symptoms of biliary colic -- Concerning findings of acute elevation of LFTs -- Imaging findings of cholelithiasis and common bile duct dilatation -- MRCP ordered for further clarification to rule out choledocholithiasis -- For HIDA scan when able -- Agree with IV antibiotics, IV metronidazole  to regimen -- Further recommendations to follow pending results of imaging studies -- Continue supportive care -- Clear liquids only at this time  Elevated LFTs -- concern for choledocholithiasis -- Patient does have a history of hepatic steatosis but does not have a history of elevated LFTs -- This is suggesting that this is an acute LFT rise -- Follow-up MRCP findings done with and without contrast -- Follow daily LFTs -- Lipase reassuring to rule out pancreatitis  Uncontrolled type 2 diabetes mellitus hypoglycemia Hypoglycemia --The hypoglycemia has been corrected -- Added SSI coverage with CBG monitoring 5 times per day -- uncontrolled as evidenced by A1c>14%   Bilateral pneumonia --  Provide coverage for HCAP and aspiration given image findings -- Continue IV antibiotics as ordered -- Continue supportive measures -- Obtain blood cultures x 2  Acute HFpEF -- evidence of pulmonary edema and bilateral pleural effusions on imaging -- IV lasix  ordered 20 mg TID -- reduced dosing due to soft BPs -- Monitor daily weights, intake and output, electrolytes and ReDs vest readings  Chronic pain Opioid dependence -- pt reports she is on subutex -- resume when meds reconciled -- may have to use suboxone as we may not have subutex in hospital -- use nonopioid modalities for pain mgmt as much as possible -- toradol  ordered for severe pain  Tobacco use -- nicotine  patch 21 mg daily ordered   GERD  -- pantoprazole ordered for GI protection  DVT prophylaxis: SQ heparin   Code Status: Full   Family Communication:   Disposition Plan:  [facility]  Consults called:   Admission status: INP Time spent: 73 mins   Level of care: Med-Surg Afton Louder MD [facility] How to contact the [facility] Attending or Consulting provider 7A - 7P or covering provider during after hours 7P -7A, for this patient?  Check the care team in [facility] and look for a) attending/consulting [facility] provider listed and b) the [facility] team listed Log into [email or web address] and use 's universal password to access. If you do not have the password, please contact the hospital operator. Locate the [facility] provider you are  looking for under [facility] and page to a number that you can be directly reached. If you still have difficulty reaching the provider, please page the [facility] (Director on Call) for the Hospitalists listed on amion for assistance.   If 7PM-7AM, please contact night-coverage [email or web address] Password [facility]  03/18/2024, 3:16 PM

## 2024-03-18 NOTE — Hospital Course (Signed)
 58 year old female with history of alcohol use disorder, detoxed approximately 1 month ago and currently residing at [facility] [place], recently discharged from [facility] family medicine service 1 month ago after being treated for pyelonephritis, past medical history significant for hepatic steatosis, substance abuse, currently on Suboxone, poorly controlled type 2 diabetes mellitus with hyperglycemia, osteoarthritis of the hips, hypoxia, history of DKA, depression and anxiety, chronic constipation who was brought in by [facility] from [place] [facility] for 2 days of colicky right upper quadrant abdominal pain that is progressively worsened.  Patient reportedly has a history of cholelithiasis.  Patient has complained of nausea but no emesis.  Patient reports she had a bout of severe flank pain on the right side that radiated down into her back and right leg.  Patient denies chest pain and shortness of breath.  Patient denies dysuria.  Patient denies fever and chills.  On arrival to the ED she was noted to be hypoxic with a pulse ox of 84% on room air.  Her workup in the ED significant for hypoglycemia, blood sugar 33, CTA chest PE with findings of bilateral pleural effusions and moderate bilateral pulmonary edema, no PE found, bilateral lower lobe airspace disease suggesting pneumonia and/or aspiration.  Abdominal ultrasound with findings of large gallstone within the gallbladder neck measuring 3.4 cm, Mildly dilated common bile duct measuring 9 mm without visible choledocholithiasis in the visualized proximal segment, gallbladder enlargement, bladder wall thickening with concern for cystitis, hepatic steatosis.  She was started on IV antibiotics for pneumonia and admission requested for further management.

## 2024-03-18 NOTE — Progress Notes (Signed)
 Pharmacy Antibiotic Note  Nancy [name] is a 58 y.o. female admitted on 03/18/2024 with pneumonia.  Pharmacy has been consulted for vancomycin dosing.  Plan: Vancomycin 1500 mg IV x 1 dose. Vancomycin 1000 mg IV every 12 hours Cefepime 2000 mg IV every 8 hours. Monitor labs, c/s, and vanco levels as indicated.  Height: 5' 9 (175.3 cm) Weight: 68 kg (149 lb 14.6 oz) IBW/kg (Calculated) : 66.2  Temp (24hrs), Avg:98.1 F (36.7 C), Min:98.1 F (36.7 C), Max:98.1 F (36.7 C)  Recent Labs  Lab 03/18/24 1114  WBC 9.3  CREATININE 0.34*    Estimated Creatinine Clearance: 81.1 mL/min (A) (by C-G formula based on SCr of 0.34 mg/dL (L)).    Allergies  Allergen Reactions   Glucophage  [Metformin ] Diarrhea    Antimicrobials this admission: Vanco 9/28 >> Cefepime 9/28 >>  Microbiology results: 9/28 BCx: pending 9/28 MRSA PCR: pending  Thank you for allowing pharmacy to be a part of this patient's care.  Elspeth Sour, PharmD Clinical Pharmacist 03/18/2024 3:47 PM

## 2024-03-18 NOTE — ED Triage Notes (Signed)
 Pt bib [facility] from [place] [facility] for RUQ pain x2 days that has progressively gotten worse. Hx of gallstones. C/o of nausea also.

## 2024-03-19 ENCOUNTER — Inpatient Hospital Stay (HOSPITAL_COMMUNITY)

## 2024-03-19 DIAGNOSIS — E7841 Elevated Lipoprotein(a): Secondary | ICD-10-CM

## 2024-03-19 DIAGNOSIS — J9601 Acute respiratory failure with hypoxia: Secondary | ICD-10-CM | POA: Diagnosis not present

## 2024-03-19 DIAGNOSIS — E43 Unspecified severe protein-calorie malnutrition: Secondary | ICD-10-CM

## 2024-03-19 DIAGNOSIS — E1165 Type 2 diabetes mellitus with hyperglycemia: Secondary | ICD-10-CM | POA: Diagnosis not present

## 2024-03-19 DIAGNOSIS — R1011 Right upper quadrant pain: Secondary | ICD-10-CM | POA: Diagnosis not present

## 2024-03-19 DIAGNOSIS — D649 Anemia, unspecified: Secondary | ICD-10-CM | POA: Diagnosis not present

## 2024-03-19 LAB — COMPREHENSIVE METABOLIC PANEL WITH GFR
ALT: 98 U/L — ABNORMAL HIGH (ref 0–44)
AST: 58 U/L — ABNORMAL HIGH (ref 15–41)
Albumin: 2.8 g/dL — ABNORMAL LOW (ref 3.5–5.0)
Alkaline Phosphatase: 160 U/L — ABNORMAL HIGH (ref 38–126)
Anion gap: 9 (ref 5–15)
BUN: 11 mg/dL (ref 6–20)
CO2: 26 mmol/L (ref 22–32)
Calcium: 8.4 mg/dL — ABNORMAL LOW (ref 8.9–10.3)
Chloride: 100 mmol/L (ref 98–111)
Creatinine, Ser: 0.42 mg/dL — ABNORMAL LOW (ref 0.44–1.00)
GFR, Estimated: >60 mL/min (ref >60)
Glucose, Bld: 88 mg/dL (ref 70–99)
Potassium: 3.9 mmol/L (ref 3.5–5.1)
Sodium: 135 mmol/L (ref 135–145)
Total Bilirubin: 0.4 mg/dL (ref 0.0–1.2)
Total Protein: 5.6 g/dL — ABNORMAL LOW (ref 6.5–8.1)

## 2024-03-19 LAB — GLUCOSE, CAPILLARY
Glucose-Capillary: 92 mg/dL (ref 70–99)
Glucose-Capillary: 95 mg/dL (ref 70–99)
Glucose-Capillary: 73 mg/dL (ref 70–99)
Glucose-Capillary: 188 mg/dL — ABNORMAL HIGH (ref 70–99)
Glucose-Capillary: 190 mg/dL — ABNORMAL HIGH (ref 70–99)

## 2024-03-19 LAB — CBC
HCT: 31.5 % — ABNORMAL LOW (ref 36.0–46.0)
Hemoglobin: 10.1 g/dL — ABNORMAL LOW (ref 12.0–15.0)
MCH: 30.3 pg (ref 26.0–34.0)
MCHC: 32.1 g/dL (ref 30.0–36.0)
MCV: 94.6 fL (ref 80.0–100.0)
Platelets: 276 K/uL (ref 150–400)
RBC: 3.33 MIL/uL — ABNORMAL LOW (ref 3.87–5.11)
RDW: 12.1 % (ref 11.5–15.5)
WBC: 6.5 K/uL (ref 4.0–10.5)
nRBC: 0 % (ref 0.0–0.2)

## 2024-03-19 LAB — MRSA NEXT GEN BY PCR, NASAL: MRSA by PCR Next Gen: NOT DETECTED

## 2024-03-19 LAB — BRAIN NATRIURETIC PEPTIDE: B Natriuretic Peptide: 54 pg/mL (ref 0.0–100.0)

## 2024-03-19 MED ORDER — OXYCODONE HCL 5 MG PO TABS
5.0000 mg | ORAL_TABLET | ORAL | Status: DC | PRN
Start: 2024-03-19 — End: 2024-03-22
  Administered 2024-03-19 – 2024-03-22 (×12): 5 mg via ORAL
  Filled 2024-03-19 (×13): qty 1

## 2024-03-19 MED ORDER — FUROSEMIDE 10 MG/ML IJ SOLN
30.0000 mg | Freq: Three times a day (TID) | INTRAMUSCULAR | Status: DC
  Administered 2024-03-19 – 2024-03-20 (×3): 30 mg via INTRAVENOUS
  Filled 2024-03-19 (×3): qty 4

## 2024-03-19 MED ORDER — TECHNETIUM TC 99M MEBROFENIN IV KIT
5.0000 | PACK | Freq: Once | INTRAVENOUS | Status: AC | PRN
  Administered 2024-03-19: 5 via INTRAVENOUS

## 2024-03-19 NOTE — Progress Notes (Addendum)
 PROGRESS NOTE   Nancy [name]  FMW:[id number] DOB: 1965-12-13 DOA: 03/18/2024 PCP: Earvin Johnston PARAS, FNP   Chief Complaint  Patient presents with   Abdominal Pain   Level of care: Med-Surg  Brief Admission History:  58 year old female with history of alcohol use disorder, detoxed approximately 1 month ago and currently residing at [facility], recently discharged from [facility] family medicine service 1 month ago after being treated for pyelonephritis, past medical history significant for hepatic steatosis, substance abuse, currently on Suboxone, poorly controlled type 2 diabetes mellitus with hyperglycemia, osteoarthritis of the hips, hypoxia, history of DKA, depression and anxiety, chronic constipation who was brought in by [facility] from [place] [facility] for 2 days of colicky right upper quadrant abdominal pain that is progressively worsened.  Patient reportedly has a history of cholelithiasis.  Patient has complained of nausea but no emesis.  Patient reports she had a bout of severe flank pain on the right side that radiated down into her back and right leg.  Patient denies chest pain and shortness of breath.  Patient denies dysuria.  Patient denies fever and chills.  On arrival to the ED she was noted to be hypoxic with a pulse ox of 84% on room air.  Her workup in the ED significant for hypoglycemia, blood sugar 33, CTA chest PE with findings of bilateral pleural effusions and moderate bilateral pulmonary edema, no PE found, bilateral lower lobe airspace disease suggesting pneumonia and/or aspiration.  Abdominal ultrasound with findings of large gallstone within the gallbladder neck measuring 3.4 cm, Mildly dilated common bile duct measuring 9 mm without visible choledocholithiasis in the visualized proximal segment, gallbladder enlargement, bladder wall thickening with concern for cystitis, hepatic steatosis.  She was started on IV antibiotics for pneumonia and admission requested for  further management.    Assessment and Plan:  Colicky RUQ abdominal pain -- Patient presenting with symptoms of biliary colic -- Concerning findings of acute elevation of LFTs -- Imaging findings of cholelithiasis and common bile duct dilatation -- MRCP ordered for further clarification to rule out choledocholithiasis -- HIDA scan was unable to be completed on 9/29 due to severe claustrophobia  -- Agree with IV antibiotics, IV metronidazole  to regimen -- Further recommendations to follow pending results of imaging studies -- Continue supportive care -- full liquids, advance as tolerated  -- will ask surgery to see in AM    Elevated LFTs -- choledocholithiasis ruled out, she may have passed a gallstone  -- Patient does have a history of hepatic steatosis but does not have a history of elevated LFTs -- This is suggesting that this was an acute LFT rise -- Follow-up MRCP findings done with and without contrast - negative for choledocholithiasis -- Follow daily LFTs -- Lipase reassuring to rule out pancreatitis   Uncontrolled type 2 diabetes mellitus with hypoglycemia Hypoglycemia --The hypoglycemia has been corrected -- Added SSI coverage with CBG monitoring 5 times per day -- uncontrolled as evidenced by A1c>14%  CBG (last 3)  Recent Labs    03/19/24 0303 03/19/24 0722 03/19/24 1140  GLUCAP 92 95 73    Bilateral pneumonia -- Provide coverage for HCAP and aspiration given image findings -- Continue IV antibiotics as ordered -- Continue supportive measures -- Obtain blood cultures x 2   Acute HFpEF -- evidence of pulmonary edema and bilateral pleural effusions on imaging -- IV lasix  ordered 20 mg TID -- reduced dosing due to soft BPs -- Monitor daily weights, intake and output, electrolytes  and ReDs vest readings -- increase IV lasix  to 30  mg TID    Chronic pain Opioid dependence -- pt reports she is on subutex -- resume when meds reconciled -- may have to use  suboxone as we may not have subutex in hospital -- use nonopioid modalities for pain mgmt as much as possible -- toradol  ordered for severe pain   Tobacco use -- nicotine  patch 21 mg daily ordered    GERD  -- pantoprazole ordered for GI protection   DVT prophylaxis: SQ heparin Code Status: Full  Family Communication:  Disposition: return to [facility]   Consultants:   Procedures:   Antimicrobials:  Anti-infectives (From admission, onward)    Start     Dose/Rate Route Frequency Ordered Stop   03/19/24 0600  vancomycin (VANCOCIN) IVPB 1000 mg/200 mL premix        1,000 mg 200 mL/hr over 60 Minutes Intravenous Every 12 hours 03/18/24 1544     03/19/24 0000  ceFEPIme (MAXIPIME) 2 g in sodium chloride  0.9 % 100 mL IVPB        2 g 200 mL/hr over 30 Minutes Intravenous Every 8 hours 03/18/24 1543     03/18/24 1500  metroNIDAZOLE  (FLAGYL ) IVPB 500 mg        500 mg 100 mL/hr over 60 Minutes Intravenous Every 12 hours 03/18/24 1434     03/18/24 1430  vancomycin (VANCOREADY) IVPB 1500 mg/300 mL        1,500 mg 150 mL/hr over 120 Minutes Intravenous  Once 03/18/24 1416 03/18/24 1812   03/18/24 1415  vancomycin (VANCOCIN) IVPB 1000 mg/200 mL premix  Status:  Discontinued        1,000 mg 200 mL/hr over 60 Minutes Intravenous  Once 03/18/24 1413 03/18/24 1416   03/18/24 1415  ceFEPIme (MAXIPIME) 2 g in sodium chloride  0.9 % 100 mL IVPB        2 g 200 mL/hr over 30 Minutes Intravenous  Once 03/18/24 1413 03/18/24 1641        Subjective: Pt says she hurts all over, she is malaised, and she is weak Objective: Vitals:   03/19/24 0440 03/19/24 0736 03/19/24 1015 03/19/24 1342  BP:  103/69 128/74 128/80  Pulse:  78 85 82  Resp:  18 17   Temp:  97.7 F (36.5 C) (!) 97.5 F (36.4 C) 98.6 F (37 C)  TempSrc:  Oral Oral Oral  SpO2:  90% 94% 93%  Weight: 75.4 kg     Height:        Intake/Output Summary (Last 24 hours) at 03/19/2024 1415 Last data filed at 03/18/2024  1800 Gross per 24 hour  Intake 200 ml  Output --  Net 200 ml   Filed Weights   03/18/24 1041 03/19/24 0440  Weight: 68 kg 75.4 kg   Examination:  General exam: Appears calm and comfortable  Respiratory system: Clear to auscultation. Respiratory effort normal. Cardiovascular system: normal S1 & S2 heard. No JVD, murmurs, rubs, gallops or clicks. No pedal edema. Gastrointestinal system: Abdomen is nondistended, soft and tender in the RUQ. No organomegaly or masses felt. Normal bowel sounds heard. Central nervous system: Alert and oriented. No focal neurological deficits. Extremities: Symmetric 5 x 5 power. Skin: No rashes, lesions or ulcers. Psychiatry: Judgement and insight appear normal. Mood & affect appropriate.   Data Reviewed: I have personally reviewed following labs and imaging studies  CBC: Recent Labs  Lab 03/18/24 1114 03/18/24 1620 03/19/24 0507  WBC 9.3  8.6 6.5  NEUTROABS 6.1  --   --   HGB 11.5* 10.8* 10.1*  HCT 35.8* 33.2* 31.5*  MCV 94.5 94.6 94.6  PLT 307 310 276    Basic Metabolic Panel: Recent Labs  Lab 03/18/24 1114 03/19/24 0507  NA 136 135  K 4.0 3.9  CL 98 100  CO2 27 26  GLUCOSE 52* 88  BUN 11 11  CREATININE 0.34* 0.42*  CALCIUM  9.2 8.4*    CBG: Recent Labs  Lab 03/18/24 1618 03/18/24 1945 03/19/24 0303 03/19/24 0722 03/19/24 1140  GLUCAP 77 190* 92 95 73    Recent Results (from the past 240 hours)  Culture, blood (Routine X 2) w Reflex to ID Panel     Status: None (Preliminary result)   Collection Time: 03/18/24  4:20 PM   Specimen: Left Antecubital; Blood  Result Value Ref Range Status   Specimen Description   Final    LEFT ANTECUBITAL BOTTLES DRAWN AEROBIC AND ANAEROBIC   Special Requests Blood Culture adequate volume  Final   Culture   Final    NO GROWTH < 24 HOURS Performed at [facility], [address], [place], [place] [address]    Report Status PENDING  Incomplete  Culture, blood (Routine X 2) w Reflex to ID  Panel     Status: None (Preliminary result)   Collection Time: 03/18/24  4:20 PM   Specimen: BLOOD RIGHT HAND  Result Value Ref Range Status   Specimen Description   Final    BLOOD RIGHT HAND BOTTLES DRAWN AEROBIC AND ANAEROBIC   Special Requests Blood Culture adequate volume  Final   Culture   Final    NO GROWTH < 24 HOURS Performed at [facility], [address], [place], [place] [address]    Report Status PENDING  Incomplete     Radiology Studies: DG Chest Port 1 View Result Date: 03/19/2024 CLINICAL DATA:  58 year old female with pulmonary edema, heart failure. EXAM: PORTABLE CHEST 1 VIEW COMPARISON:  Chest CTA yesterday. FINDINGS: Portable AP supine views at 0537 hours. Bilateral pulmonary edema, with small layering pleural effusions better demonstrated on CTA. Compared to portable chest yesterday there is decreased fluid in the right minor fissure. Pulmonary vascular congestion has not improved. Larger lung volumes. Stable cardiac size and mediastinal contours. No pneumothorax or consolidation. Visualized tracheal air column is within normal limits. Stable visualized osseous structures. Negative visible bowel gas. IMPRESSION: Ongoing pulmonary edema with larger lung volumes and mildly decreased appearance of pleural fluid. Electronically Signed   By: VEAR Hurst M.D.   On: 03/19/2024 06:14   MR ABDOMEN MRCP W WO CONTAST Result Date: 03/18/2024 CLINICAL DATA:  Cholelithiasis.  RIGHT upper quadrant pain. EXAM: MRI ABDOMEN WITHOUT AND WITH CONTRAST (INCLUDING MRCP) TECHNIQUE: Multiplanar multisequence MR imaging of the abdomen was performed both before and after the administration of intravenous contrast. Heavily T2-weighted images of the biliary and pancreatic ducts were obtained, and three-dimensional MRCP images were rendered by post processing. CONTRAST:  7mL GADAVIST GADOBUTROL 1 MMOL/ML IV SOLN COMPARISON:  Ultrasound same day, CT chest same day FINDINGS: Lower chest:  Bibasilar effusions and  atelectasis. Hepatobiliary: No intrahepatic or extrahepatic biliary duct dilatation. No filling defect within the common bile duct. The common hepatic duct measures 8 mm. The common bile duct measures 5 mm. Large ovoid stone within the proximal gallbladder measures 3.6 cm. No pericholecystic fluid. No gallbladder wall thickening. Pancreas: Normal pancreatic parenchymal intensity. No ductal dilatation or inflammation. Spleen: Normal spleen. Adrenals/urinary  tract: Adrenal glands and kidneys are normal. Stomach/Bowel: Stomach and limited of the small bowel is unremarkable Vascular/Lymphatic: Abdominal aortic normal caliber. No retroperitoneal periportal lymphadenopathy. Musculoskeletal: No aggressive osseous lesion IMPRESSION: 1. Large gallstone without evidence acute cholecystitis. 2. No choledocholithiasis. 3. No biliary duct dilatation. 4. Normal pancreas. 5. Bibasilar effusions and atelectasis. Electronically Signed   By: Jackquline Boxer M.D.   On: 03/18/2024 15:51   MR 3D Recon At Scanner Result Date: 03/18/2024 CLINICAL DATA:  Cholelithiasis.  RIGHT upper quadrant pain. EXAM: MRI ABDOMEN WITHOUT AND WITH CONTRAST (INCLUDING MRCP) TECHNIQUE: Multiplanar multisequence MR imaging of the abdomen was performed both before and after the administration of intravenous contrast. Heavily T2-weighted images of the biliary and pancreatic ducts were obtained, and three-dimensional MRCP images were rendered by post processing. CONTRAST:  7mL GADAVIST GADOBUTROL 1 MMOL/ML IV SOLN COMPARISON:  Ultrasound same day, CT chest same day FINDINGS: Lower chest:  Bibasilar effusions and atelectasis. Hepatobiliary: No intrahepatic or extrahepatic biliary duct dilatation. No filling defect within the common bile duct. The common hepatic duct measures 8 mm. The common bile duct measures 5 mm. Large ovoid stone within the proximal gallbladder measures 3.6 cm. No pericholecystic fluid. No gallbladder wall thickening. Pancreas: Normal  pancreatic parenchymal intensity. No ductal dilatation or inflammation. Spleen: Normal spleen. Adrenals/urinary tract: Adrenal glands and kidneys are normal. Stomach/Bowel: Stomach and limited of the small bowel is unremarkable Vascular/Lymphatic: Abdominal aortic normal caliber. No retroperitoneal periportal lymphadenopathy. Musculoskeletal: No aggressive osseous lesion IMPRESSION: 1. Large gallstone without evidence acute cholecystitis. 2. No choledocholithiasis. 3. No biliary duct dilatation. 4. Normal pancreas. 5. Bibasilar effusions and atelectasis. Electronically Signed   By: Jackquline Boxer M.D.   On: 03/18/2024 15:51   US  Venous Img Lower Unilateral Right Result Date: 03/18/2024 CLINICAL DATA:  Right lower extremity pain from the right hip to the right ankle. EXAM: RIGHT LOWER EXTREMITY VENOUS DOPPLER ULTRASOUND TECHNIQUE: Gray-scale sonography with graded compression, as well as color Doppler and duplex ultrasound were performed to evaluate the lower extremity deep venous systems from the level of the common femoral vein and including the common femoral, femoral, profunda femoral, popliteal and calf veins including the posterior tibial, peroneal and gastrocnemius veins when visible. The superficial great saphenous vein was also interrogated. Spectral Doppler was utilized to evaluate flow at rest and with distal augmentation maneuvers in the common femoral, femoral and popliteal veins. COMPARISON:  None Available. FINDINGS: Contralateral Common Femoral Vein: Respiratory phasicity is normal and symmetric with the symptomatic side. No evidence of thrombus. Normal compressibility. Common Femoral Vein: No evidence of thrombus. Normal compressibility, respiratory phasicity and response to augmentation. Saphenofemoral Junction: No evidence of thrombus. Normal compressibility and flow on color Doppler imaging. Profunda Femoral Vein: No evidence of thrombus. Normal compressibility and flow on color Doppler  imaging. Femoral Vein: No evidence of thrombus. Normal compressibility, respiratory phasicity and response to augmentation. Popliteal Vein: No evidence of thrombus. Normal compressibility, respiratory phasicity and response to augmentation. Calf Veins: No evidence of thrombus. Normal compressibility and flow on color Doppler imaging. Superficial Great Saphenous Vein: No evidence of thrombus. Normal compressibility. Venous Reflux:  None. Other Findings:  None. IMPRESSION: No evidence of deep venous thrombosis within the RIGHT lower extremity. Electronically Signed   By: Suzen Dials M.D.   On: 03/18/2024 14:42   CT Angio Chest PE W and/or Wo Contrast Result Date: 03/18/2024 EXAM: CTA CHEST 03/18/2024 12:56:00 PM TECHNIQUE: CTA of the chest was performed without and with the administration of 75 mL  of intravenous contrast (iohexol  (OMNIPAQUE ) 350 MG/ML injection). Multiplanar reformatted images are provided for review. MIP images are provided for review. Automated exposure control, iterative reconstruction, and/or weight based adjustment of the mA/kV was utilized to reduce the radiation dose to as low as reasonably achievable. COMPARISON: 10/26/2023 CLINICAL HISTORY: Pulmonary embolism (PE) suspected, low to intermediate prob, positive D-dimer. PE suspected; Positive D-dimer; Omnipaque  350; 75 cc; bib [facility]ms from [facility] for RUQ pain x2 days that has progressively gotten worse. Hx of gallstones. C/o of nausea also. FINDINGS: PULMONARY ARTERIES: Pulmonary arteries are adequately opacified for evaluation. No acute pulmonary embolus. Main pulmonary artery is normal in caliber. MEDIASTINUM: The heart and pericardium demonstrate multivessel coronary artery calcifications. There is aortic atherosclerosis. No acute abnormality of the thoracic aorta. LYMPH NODES: Enlarged mediastinal and bilateral hilar lymph nodes are identified. For example, right hilar node measures 1.7 cm (image 127/5), previously 1.5 cm.  Left paratracheal lymph node measures 1.7 cm, previously 1 cm. Subcarinal node measures 1.6 cm (image 134/5), previously 1.4 cm. LUNGS AND PLEURA: Small bilateral pleural effusions, left greater than right. No pneumothorax identified. Diffuse interlobular septal thickening with ground glass attenuation noted throughout both lungs compatible with pulmonary edema. Bilateral lower lobe airspace consolidation concerning for pneumonia or aspiration. Persistent non-solid nodule within the left upper lobe containing ground glass and cystic components is stable measuring 1.3 cm (image 58/6). UPPER ABDOMEN: Large calcified stone within the gallbladder neck measures 3.7 cm. SOFT TISSUES AND BONES: No acute bone or soft tissue abnormality. No acute or suspicious osseous findings. IMPRESSION: 1. No evidence of pulmonary embolism. 2. Bilateral pleural effusions and moderate diffuse pulmonary edema. 3. Bilateral lower lobe airspace consolidation concerning for pneumonia or aspiration. 4. Enlarged mediastinal and bilateral hilar lymph nodes, including right hilar (1.7 cm), left paratracheal (1.7 cm), and subcarinal (1.6 cm) nodes. Likely reactive in the setting of chf and pneumonia. 5. Large calcified gallstone within the gallbladder neck measuring 3.7 cm. 6. Aortic atherosclerosis and multivessel coronary artery calcifications. Electronically signed by: Waddell Calk MD 03/18/2024 01:35 PM EDT RP Workstation: [id number]   US  ABDOMEN LIMITED RUQ (LIVER/GB) Result Date: 03/18/2024 EXAM: Right Upper Quadrant Abdominal Ultrasound 03/18/2024 12:29:49 PM TECHNIQUE: Real-time ultrasonography of the right upper quadrant of the abdomen was performed. COMPARISON: CT of the abdomen and pelvis from earlier today. CLINICAL HISTORY: RUQ abdominal pain. FINDINGS: LIVER: Increased parenchymal echogenicity of the liver. The portal vein is patent with normal flow towards the liver. No intrahepatic biliary ductal dilatation. No evidence of mass.  BILIARY SYSTEM: The gallbladder wall measures 3.5 mm in thickness. There is a large shadowing stone within the gallbladder neck measuring 3.4 cm. No pericholecystic fluid or sonographic Murphy's sign. The common bile duct is increasing caliber measuring 9 mm. No stones identified within the visualized proximal portion of the CBD. RIGHT KIDNEY: The right kidney is grossly unremarkable in appearances without evidence of hydronephrosis, echogenic calculi or worrisome mass lesions. PANCREAS: The pancreas is not well visualized. OTHER: Small right pleural effusion is incidentally noted. No right upper quadrant ascites. IMPRESSION: 1. Large shadowing gallstone within the gallbladder neck measuring 3.4 cm. The gallbladder wall is mildly thickened, measuring 3.5 mm. No pericholecystic fluid or sonographic murphy sign. 2. Mildly dilated common bile duct measuring 9 mm without visible choledocholithiasis in the visualized proximal segment; correlate with liver function tests and consider further evaluation with MRCP if clinically indicated. 3. Increased hepatic parenchymal echogenicity, compatible with hepatic steatosis. 4. Small right pleural effusion incidentally noted. Electronically  signed by: Waddell Calk MD 03/18/2024 12:51 PM EDT RP Workstation: [id number]   CT Renal Stone Study Result Date: 03/18/2024 CLINICAL DATA:  Abdominal/flank pain, stone suspected EXAM: CT ABDOMEN AND PELVIS WITHOUT CONTRAST TECHNIQUE: Multidetector CT imaging of the abdomen and pelvis was performed following the standard protocol without IV contrast. RADIATION DOSE REDUCTION: This exam was performed according to the departmental dose-optimization program which includes automated exposure control, adjustment of the mA and/or kV according to patient size and/or use of iterative reconstruction technique. COMPARISON:  January 24, 2024 FINDINGS: Evaluation is limited by lack of IV contrast. Lower chest: Small bilateral pleural effusions with  bibasilar heterogeneous airspace opacities and bronchial wall thickening. Mild interlobular septal thickening. Hepatobiliary: Large cholelithiasis. The gallbladder is distended. No definitive pericholecystic fluid. There is a single with area of fat stranding along the inferior margin of the gallbladder, nonspecific (series 5, image 35). Fluid. No extrahepatic biliary ductal dilation. Periportal edema. Pancreas: No new peripancreatic fat stranding. Relatively atrophic appearance of the pancreas. Spleen: Unremarkable. Adrenals/Urinary Tract: Adrenal glands are unremarkable. No hydronephrosis. No obstructing nephrolithiasis. Circumferential wall thickening with faint adjacent fat stranding of the bladder. Stomach/Bowel: No evidence of bowel obstruction. Appendix is unremarkable. Stomach is decompressed, limiting evaluation. Favor small hiatal hernia. Vascular/Lymphatic: Atherosclerotic calcifications of the nonaneurysmal abdominal aorta. Prominent retroperitoneal lymph nodes with representative LEFT periaortic lymph node measuring 9 mm in the short axis, similar compared to prior (series 2, image 28). Perigastric lymph node measuring 12 mm in the short axis, previously 11 mm (series 2, image 15). Aortocaval node measuring 8 mm in the short axis, unchanged (series 2, image 29). Representative RIGHT inguinal lymph node measures 13 mm in the short axis, similar compared to prior (series 2, image 86). Reproductive: Status post hysterectomy. No adnexal masses. Other: Trace free fluid in the pelvis.  Anasarca. Musculoskeletal: Advanced degenerative changes of bilateral hips. Linear horizontal lucency traversing the RIGHT sacrum (series 5, image 85). IMPRESSION: 1. No hydronephrosis or obstructing nephrolithiasis. 2. Circumferential wall thickening of the bladder with faint adjacent fat stranding. Recommend correlation with urinalysis to exclude cystitis. 3. Large cholelithiasis with distended gallbladder. There is a single  focus of fat stranding along the inferior margin of the gallbladder, nonspecific in a background of anasarca and favored to be incidental. If there is persistent clinical concern for acute cholecystitis, HIDA scan would be the modality of choice for further evaluation. 4. Small bilateral pleural effusions with bibasilar heterogeneous airspace opacities and bronchial wall thickening. Findings are favored to reflect pulmonary edema with superimposed atelectasis. 5. Linear horizontal lucency traversing the RIGHT sacrum. This could reflect a nondisplaced fracture versus prominent nutrient foramen. Recommend correlation with point tenderness. 6. Prominent retroperitoneal and inguinal adenopathy, nonspecific and possibly reactive. Aortic Atherosclerosis (ICD10-I70.0). Electronically Signed   By: Corean Salter M.D.   On: 03/18/2024 12:07   DG Chest Port 1 View Result Date: 03/18/2024 EXAM: 1 VIEW(S) XRAY OF THE CHEST 03/18/2024 11:00:00 AM COMPARISON: 02/14/2024 CLINICAL HISTORY: hypoxia. Per chart; Pt bib [facility]ms from [facility] for RUQ pain x2 days that has progressively gotten worse. Hx of gallstones. C/o of nausea also. FINDINGS: LUNGS AND PLEURA: Small bilateral pleural effusions. Mild interstitial edema. Bibasilar atelectasis. No focal pulmonary opacity. No pneumothorax. HEART AND MEDIASTINUM: No acute abnormality of the cardiac and mediastinal silhouettes. BONES AND SOFT TISSUES: No acute osseous abnormality. IMPRESSION: 1. Small bilateral pleural effusions. 2. Mild interstitial edema. 3. Bibasilar atelectasis. Electronically signed by: Waddell Calk MD 03/18/2024 11:13 AM EDT RP Workstation:  [id number]    Scheduled Meds:  furosemide   30 mg Intravenous TID   heparin  5,000 Units Subcutaneous Q8H   insulin  aspart  0-9 Units Subcutaneous TID WC   nicotine   21 mg Transdermal Daily   pantoprazole  40 mg Oral QPM   Continuous Infusions:  ceFEPime (MAXIPIME) IV 2 g (03/19/24 0828)   metronidazole   500 mg (03/19/24 0210)   vancomycin 1,000 mg (03/19/24 0522)     LOS: 1 day   Time spent: 60 mins  Dorinne Graeff Vicci, MD How to contact the [facility] Attending or Consulting provider 7A - 7P or covering provider during after hours 7P -7A, for this patient?  Check the care team in [facility] and look for a) attending/consulting [facility] provider listed and b) the [facility] team listed Log into [email or web address] to find provider on call.  Locate the [facility] provider you are looking for under [facility] and page to a number that you can be directly reached. If you still have difficulty reaching the provider, please page the [facility] [facility] (Director on Call) for the Hospitalists listed on amion for assistance.  03/19/2024, 2:15 PM

## 2024-03-19 NOTE — Plan of Care (Signed)

## 2024-03-19 NOTE — TOC Initial Note (Signed)
 Transition of Care [facility]) - Initial/Assessment Note    Patient Details  Name: Nancy [name] MRN: [id number] Date of Birth: 01-15-1966  [facility] CM/SW Contact:    Lucie Lunger, LCSWA Phone Number: 03/19/2024, 1:56 PM  Clinical Narrative:                 Pt is high risk for readmission. CSW spoke with pt at bedside to complete assessment. Pt states she is a resident at [facility] and plans to return once medically stable. CSW spoke to [place] with facility who confirms pt is a long term resident and can return once medically stable. TOC to follow.   Expected Discharge Plan: Long Term Nursing Home Barriers to Discharge: Continued Medical Work up   Patient Goals and CMS Choice Patient states their goals for this hospitalization and ongoing recovery are:: return to LTC CMS Medicare.gov Compare Post Acute Care list provided to:: Patient Choice offered to / list presented to : Patient      Expected Discharge Plan and Services In-house Referral: Clinical Social Work Discharge Planning Services: CM Consult Post Acute Care Choice: Nursing Home Living arrangements for the past 2 months: Skilled Nursing Facility                                      Prior Living Arrangements/Services Living arrangements for the past 2 months: Skilled Nursing Facility Lives with:: Facility Resident Patient language and need for interpreter reviewed:: Yes Do you feel safe going back to the place where you live?: Yes      Need for Family Participation in Patient Care: Yes (Comment) Care giver support system in place?: Yes (comment)   Criminal Activity/Legal Involvement Pertinent to Current Situation/Hospitalization: No - Comment as needed  Activities of Daily Living   ADL Screening (condition at time of admission) Independently performs ADLs?: Yes (appropriate for developmental age) Is the patient deaf or have difficulty hearing?: No Does the patient have difficulty  seeing, even when wearing glasses/contacts?: No Does the patient have difficulty concentrating, remembering, or making decisions?: No  Permission Sought/Granted                  Emotional Assessment Appearance:: Appears stated age Attitude/Demeanor/Rapport: Engaged Affect (typically observed): Accepting Orientation: : Oriented to Self, Oriented to Place, Oriented to  Time, Oriented to Situation Alcohol / Substance Use: Not Applicable Psych Involvement: No (comment)  Admission diagnosis:  Acute pulmonary edema (HCC) [J81.0] Pain [R52] Elevated LFTs [R79.89] Acute respiratory failure with hypoxia (HCC) [J96.01] HCAP (healthcare-associated pneumonia) [J18.9] Gallstone ileus of large intestine (HCC) [K56.3] Patient Active Problem List   Diagnosis Date Noted   Acute respiratory failure with hypoxia (HCC) 03/18/2024   Cholelithiasis 03/18/2024   Hypoglycemia 03/18/2024   Elevated transaminase level 03/18/2024   Colicky RUQ abdominal pain 03/18/2024   UTI (urinary tract infection) 02/15/2024   Hypoxia 02/13/2024   Dyspnea 02/10/2024   Protein-calorie malnutrition, severe 02/06/2024   Muscle spasm 01/27/2024   Constipation 01/27/2024   Chronic health problem 01/27/2024   Insomnia due to psychological stress 01/26/2024   Pressure ulcer 01/25/2024   Type 2 diabetes mellitus with diabetic neuropathy, unspecified (HCC) 01/25/2024   Hyperglycemia due to diabetes mellitus (HCC) 01/24/2024   Low back pain 01/24/2024   OA (osteoarthritis) of hip 01/24/2024   Substance abuse (HCC) 01/24/2024   DKA, type 1 (HCC) 10/26/2023   DKA (diabetic  ketoacidosis) (HCC) 10/26/2023   Osteoarthritis 10/26/2023   Hypokalemia 10/26/2023   Normocytic anemia 10/26/2023   Cellulitis and abscess of foot, except toes 04/07/2021   Pain due to onychomycosis of toenails of both feet 08/08/2019   Diabetes mellitus without complication (HCC) 08/08/2019   Unilateral primary osteoarthritis, left hip  08/03/2017   Unilateral primary osteoarthritis, right hip 08/03/2017   Uncontrolled type 2 diabetes mellitus with hyperglycemia (HCC) 07/29/2017   Primary osteoarthritis of left hip 02/10/2016   Asthma 12/31/2015   Hyperlipidemia 10/30/2015   Depression with anxiety 10/29/2015   Polysubstance abuse (HCC) 09/04/2015   Depression 09/04/2015   Osteoarthritis of right hip 09/04/2015   Alcohol abuse 12/05/2013   S/P alcohol detoxification 12/05/2013   Carcinoma in situ of cervix 11/24/2011   BV (bacterial vaginosis) 11/24/2011   ASCUS with positive high risk HPV 11/08/2011   PCP:  Earvin Johnston PARAS, FNP Pharmacy:   [id number] - [place] ([place]), [place] - [address] [place] ([place]) [place] [address] Phone: [phone] Fax: [phone]  [id number] [place], [place] - [address] AT [facility] OF [address] [place]  [address] Phone: [phone] Fax: [phone]  [facility] [address] [place] [address] Phone: [phone] Fax: [phone]  [facility] [address], [address] [place] [address] Phone: [phone] Fax: [phone]     Social Drivers of Health (SDOH) Social History: SDOH Screenings   Food Insecurity: No Food Insecurity (03/18/2024)  Recent Concern: Food Insecurity - Food Insecurity Present (01/24/2024)  Housing: High Risk (03/18/2024)  Transportation Needs: Unmet Transportation Needs (03/18/2024)  Utilities: At Risk (03/18/2024)  Financial Resource Strain: Not on File (10/08/2021)   Received from [facility]  Physical Activity: Not on File (10/08/2021)   Received from [facility]  Social Connections: Socially Isolated (03/18/2024)  Stress: Not on File (10/08/2021)   Received [facility]  Tobacco Use: High Risk (03/18/2024)   SDOH Interventions:     Readmission Risk Interventions     03/19/2024    1:54 PM  Readmission Risk Prevention Plan  Transportation Screening Complete  HRI or Home Care Consult Complete  Social Work Consult for Recovery Care Planning/Counseling Complete  Palliative Care Screening Not Applicable  Medication Review Oceanographer) Complete

## 2024-03-20 ENCOUNTER — Inpatient Hospital Stay (HOSPITAL_COMMUNITY)

## 2024-03-20 ENCOUNTER — Encounter (HOSPITAL_COMMUNITY): Payer: Self-pay | Admitting: Family Medicine

## 2024-03-20 DIAGNOSIS — J9601 Acute respiratory failure with hypoxia: Secondary | ICD-10-CM | POA: Diagnosis not present

## 2024-03-20 DIAGNOSIS — E1165 Type 2 diabetes mellitus with hyperglycemia: Secondary | ICD-10-CM | POA: Diagnosis not present

## 2024-03-20 DIAGNOSIS — D649 Anemia, unspecified: Secondary | ICD-10-CM | POA: Diagnosis not present

## 2024-03-20 DIAGNOSIS — R1011 Right upper quadrant pain: Secondary | ICD-10-CM | POA: Diagnosis not present

## 2024-03-20 DIAGNOSIS — K8020 Calculus of gallbladder without cholecystitis without obstruction: Secondary | ICD-10-CM | POA: Diagnosis not present

## 2024-03-20 LAB — COMPREHENSIVE METABOLIC PANEL WITH GFR
ALT: 88 U/L — ABNORMAL HIGH (ref 0–44)
AST: 44 U/L — ABNORMAL HIGH (ref 15–41)
Albumin: 3.1 g/dL — ABNORMAL LOW (ref 3.5–5.0)
Alkaline Phosphatase: 165 U/L — ABNORMAL HIGH (ref 38–126)
Anion gap: 9 (ref 5–15)
BUN: 9 mg/dL (ref 6–20)
CO2: 28 mmol/L (ref 22–32)
Calcium: 8.7 mg/dL — ABNORMAL LOW (ref 8.9–10.3)
Chloride: 97 mmol/L — ABNORMAL LOW (ref 98–111)
Creatinine, Ser: 0.46 mg/dL (ref 0.44–1.00)
GFR, Estimated: >60 mL/min (ref >60)
Glucose, Bld: 165 mg/dL — ABNORMAL HIGH (ref 70–99)
Potassium: 3.7 mmol/L (ref 3.5–5.1)
Sodium: 134 mmol/L — ABNORMAL LOW (ref 135–145)
Total Bilirubin: 0.5 mg/dL (ref 0.0–1.2)
Total Protein: 6.1 g/dL — ABNORMAL LOW (ref 6.5–8.1)

## 2024-03-20 LAB — CBC
HCT: 32.5 % — ABNORMAL LOW (ref 36.0–46.0)
Hemoglobin: 10.6 g/dL — ABNORMAL LOW (ref 12.0–15.0)
MCH: 30.5 pg (ref 26.0–34.0)
MCHC: 32.6 g/dL (ref 30.0–36.0)
MCV: 93.4 fL (ref 80.0–100.0)
Platelets: 303 K/uL (ref 150–400)
RBC: 3.48 MIL/uL — ABNORMAL LOW (ref 3.87–5.11)
RDW: 12 % (ref 11.5–15.5)
WBC: 8.7 K/uL (ref 4.0–10.5)
nRBC: 0 % (ref 0.0–0.2)

## 2024-03-20 LAB — GLUCOSE, CAPILLARY
Glucose-Capillary: 164 mg/dL — ABNORMAL HIGH (ref 70–99)
Glucose-Capillary: 175 mg/dL — ABNORMAL HIGH (ref 70–99)
Glucose-Capillary: 203 mg/dL — ABNORMAL HIGH (ref 70–99)
Glucose-Capillary: 266 mg/dL — ABNORMAL HIGH (ref 70–99)
Glucose-Capillary: 270 mg/dL — ABNORMAL HIGH (ref 70–99)
Glucose-Capillary: 228 mg/dL — ABNORMAL HIGH (ref 70–99)

## 2024-03-20 LAB — C DIFFICILE QUICK SCREEN W PCR REFLEX
C Diff antigen: POSITIVE — AB
C Diff toxin: NEGATIVE

## 2024-03-20 MED ORDER — FUROSEMIDE 10 MG/ML IJ SOLN
30.0000 mg | Freq: Two times a day (BID) | INTRAMUSCULAR | Status: DC
  Administered 2024-03-20 – 2024-03-21 (×2): 30 mg via INTRAVENOUS
  Filled 2024-03-20 (×2): qty 4

## 2024-03-20 MED ORDER — INSULIN ASPART 100 UNIT/ML IJ SOLN
3.0000 [IU] | Freq: Three times a day (TID) | INTRAMUSCULAR | Status: DC
  Administered 2024-03-20 – 2024-03-22 (×5): 3 [IU] via SUBCUTANEOUS

## 2024-03-20 MED ORDER — INSULIN ASPART 100 UNIT/ML IJ SOLN
0.0000 [IU] | Freq: Three times a day (TID) | INTRAMUSCULAR | Status: DC
  Administered 2024-03-20: 5 [IU] via SUBCUTANEOUS
  Administered 2024-03-21: 3 [IU] via SUBCUTANEOUS

## 2024-03-20 MED ORDER — POTASSIUM CHLORIDE CRYS ER 20 MEQ PO TBCR
40.0000 meq | EXTENDED_RELEASE_TABLET | Freq: Once | ORAL | Status: AC
  Administered 2024-03-20: 40 meq via ORAL
  Filled 2024-03-20: qty 2

## 2024-03-20 MED ORDER — LIDOCAINE 5 % EX PTCH
2.0000 | MEDICATED_PATCH | Freq: Every day | CUTANEOUS | Status: DC | PRN
  Administered 2024-03-20 – 2024-03-21 (×3): 2 via TRANSDERMAL
  Filled 2024-03-20 (×3): qty 2

## 2024-03-20 NOTE — Consult Note (Signed)
 Reason for Consult: Cholelithiasis Referring Physician: Dr. Vicci Ginnie JAYSON Nancy is an 58 y.o. [name].  HPI: Patient is a 58 year old white [name] with multiple comorbidities including a history of alcohol use, having been detoxed 1 month ago at a rehab center who was discharged from [facility] medicine service due to being treated for pyelonephritis.  Nancy does have a history of hepatic steatosis, poorly controlled diabetes mellitus, hypoxia, depression, anxiety who presented to [facility] with a several day history of worsening right upper quadrant abdominal pain.  Patient was noted to be hypoxic at the time of workup in the emergency room.  CTA of the chest was negative for pulmonary embolus Nancy found bilateral pleural effusions and bilateral pulmonary edema with the suggestion of pneumonia.  Ultrasound the gallbladder revealed cholelithiasis with a slightly dilated common bile duct.  MRCP was performed which was negative for choledocholithiasis.  There was no evidence of acute cholecystitis.  Patient currently on a clear liquid diet and is tolerating that well.  Patient does not appear to be the best historian.  Past Medical History:  Diagnosis Date   Abnormal Pap smear    Bronchitis    Depression    Diabetes (HCC)    GERD (gastroesophageal reflux disease)    tums prn   Headache(784.0)    Polysubstance abuse (HCC)     Past Surgical History:  Procedure Laterality Date   CERVICAL CONIZATION W/BX  12/13/2011   Procedure: CONIZATION CERVIX WITH BIOPSY;  Surgeon: Harland JAYSON Birkenhead, MD;  Location: [facility];  Service: Gynecology;  Laterality: N/A;   DENTAL SURGERY      Family History  Problem Relation Age of Onset   Cancer Mother        unsure    Social History:  reports that Nancy has been smoking cigarettes. Nancy has a 20 pack-year smoking history. Nancy has never used smokeless tobacco. Nancy reports current alcohol use of about 7.0 standard drinks of alcohol per week. Nancy reports that Nancy  does not currently use drugs after having used the following drugs: Crack cocaine and Marijuana.  Allergies:  Allergies  Allergen Reactions   Glucophage  [Metformin ] Diarrhea    Medications: I have reviewed the patient's current medications. Prior to Admission:  Medications Prior to Admission  Medication Sig Dispense Refill Last Dose/Taking   acetaminophen  (TYLENOL ) 500 MG tablet Take 2 tablets (1,000 mg total) by mouth every 6 (six) hours as needed.   03/18/2024 Morning   amitriptyline (ELAVIL) 75 MG tablet Take 75 mg by mouth at bedtime.   03/17/2024   atorvastatin  (LIPITOR) 40 MG tablet Take 1 tablet (40 mg total) by mouth daily. 90 tablet 3 02/29/2024   Buprenorphine HCl 150 MCG FILM Place 1 Film inside cheek 2 (two) times daily.   03/18/2024 Morning   DULoxetine  (CYMBALTA ) 60 MG capsule Take 1 capsule (60 mg total) by mouth daily.   03/18/2024 Morning   fluconazole  (DIFLUCAN ) 150 MG tablet Take 150 mg by mouth every 3 (three) days.   03/07/2024   fluticasone (FLONASE) 50 MCG/ACT nasal spray Place 1 spray into both nostrils daily.   03/18/2024 Morning   furosemide  (LASIX ) 40 MG tablet Take 80 mg by mouth 2 (two) times daily.   03/14/2024   gabapentin  (NEURONTIN ) 800 MG tablet Take 800 mg by mouth 4 (four) times daily.   03/18/2024 Morning   glimepiride  (AMARYL ) 2 MG tablet Take 2 mg by mouth daily.   03/18/2024 Morning   hydrOXYzine  (ATARAX ) 25  MG tablet Take 1 tablet (25 mg total) by mouth 3 (three) times daily as needed for anxiety.   03/18/2024 Morning   insulin  aspart (NOVOLOG ) 100 UNIT/ML injection Inject 0-15 Units into the skin 3 (three) times daily with meals. (Patient taking differently: Inject 0-5 Units into the skin 3 (three) times daily with meals. Inject as per sliding scale: if 70-150=0,151-200=1.201-250=2,251-300=3,301-350=4,351-400=5 greater than 401 call md, subcutaneously with meals)   03/17/2024   insulin  glargine (LANTUS ) 100 UNIT/ML injection Inject 0.4 mLs (40 Units total) into  the skin 2 (two) times daily. (Patient taking differently: Inject 42 Units into the skin 2 (two) times daily.)   03/18/2024 Morning   lactulose, encephalopathy, (ENULOSE) 10 GM/15ML SOLN Take 20 g by mouth daily.   03/18/2024 Morning   LAMICTAL 100 MG tablet Take 100 mg by mouth 2 (two) times daily.   03/18/2024 Morning   lidocaine  (LIDODERM ) 5 % Place 1 patch onto the skin daily. Remove & Discard patch within 12 hours or as directed by MD   03/18/2024 Morning   loratadine (CLARITIN) 10 MG tablet Take 10 mg by mouth daily.   03/18/2024 Morning   melatonin 3 MG TABS tablet Take 3 mg by mouth at bedtime.   03/17/2024   methocarbamol  (ROBAXIN ) 750 MG tablet Take 1 tablet (750 mg total) by mouth every 6 (six) hours as needed for muscle spasms.   03/18/2024 Morning   Multiple Vitamin (MULTIVITAMIN WITH MINERALS) TABS tablet Take 1 tablet by mouth daily.   03/18/2024 Morning   naltrexone (DEPADE) 50 MG tablet Take 50 mg by mouth daily.   03/12/2024   nicotine  (NICODERM CQ  - DOSED IN MG/24 HOURS) 14 mg/24hr patch Place 1 patch (14 mg total) onto the skin daily.   02/26/2024   polyethylene glycol powder (GLYCOLAX ) 17 GM/SCOOP powder Take 17 g by mouth daily.   03/18/2024 Morning   saccharomyces boulardii (FLORASTOR) 250 MG capsule Take 250 mg by mouth 2 (two) times daily.   03/18/2024 Morning   senna (SENOKOT) 8.6 MG tablet Take 2 tablets by mouth 2 (two) times daily.   03/18/2024 Morning   thiamine  (VITAMIN B-1) 100 MG tablet Take 1 tablet (100 mg total) by mouth daily.   03/18/2024 Morning   Accu-Chek Softclix Lancets lancets Use as instructed 100 each 12    albuterol  (VENTOLIN  HFA) 108 (90 Base) MCG/ACT inhaler Inhale 1-2 puffs into the lungs every 4 (four) hours as needed for wheezing or shortness of breath. 18 g 2    CONSTULOSE 10 GM/15ML solution Take by mouth. (Patient not taking: Reported on 03/19/2024)   Not Taking   Glucose Blood (BLOOD GLUCOSE TEST STRIPS) STRP Use to check blood sugar 4 (four) times daily -   before meals and at bedtime. 200 strip 3    Insulin  Pen Needle (PEN NEEDLES) 31G X 5 MM MISC Use with insulin  4 (four) times daily -  before meals and at bedtime. 100 each 3    JOURNAVX 50 MG TABS  (Patient not taking: Reported on 03/19/2024)   Not Taking   lamoTRIgine (LAMICTAL) 25 MG tablet Take 50 mg by mouth 2 (two) times daily. (Patient not taking: Reported on 03/19/2024)   Not Taking   oxyCODONE  (OXY IR/ROXICODONE ) 5 MG immediate release tablet Take 5 mg by mouth. (Patient not taking: Reported on 03/19/2024)   Not Taking   sulfamethoxazole -trimethoprim  (BACTRIM  DS) 800-160 MG tablet Take 1 tablet by mouth 2 (two) times daily. (Patient not taking: Reported on 03/19/2024)  Not Taking   Vitamin D, Ergocalciferol, (DRISDOL) 1.25 MG (50000 UNIT) CAPS capsule Take 50,000 Units by mouth every 7 (seven) days. (Patient not taking: Reported on 03/19/2024)   Not Taking    Results for orders placed or performed during the hospital encounter of 03/18/24 (from the past 48 hours)  CBC with Differential     Status: Abnormal   Collection Time: 03/18/24 11:14 AM  Result Value Ref Range   WBC 9.3 4.0 - 10.5 K/uL   RBC 3.79 (L) 3.87 - 5.11 MIL/uL   Hemoglobin 11.5 (L) 12.0 - 15.0 g/dL   HCT 64.1 (L) 63.9 - 53.9 %   MCV 94.5 80.0 - 100.0 fL   MCH 30.3 26.0 - 34.0 pg   MCHC 32.1 30.0 - 36.0 g/dL   RDW 87.9 88.4 - 84.4 %   Platelets 307 150 - 400 K/uL   nRBC 0.0 0.0 - 0.2 %   Neutrophils [name] % 66 %   Neutro Abs 6.1 1.7 - 7.7 K/uL   Lymphocytes [name] 24 %   Lymphs Abs 2.2 0.7 - 4.0 K/uL   Monocytes [name] 6 %   Monocytes Absolute 0.6 0.1 - 1.0 K/uL   Eosinophils [name] 3 %   Eosinophils Absolute 0.3 0.0 - 0.5 K/uL   Basophils [name] 1 %   Basophils Absolute 0.1 0.0 - 0.1 K/uL   Immature Granulocytes 0 %   Abs Immature Granulocytes 0.03 0.00 - 0.07 K/uL    Comment: Performed at [facility], [address], [place], [place] [address]  Comprehensive metabolic panel     Status: Abnormal    Collection Time: 03/18/24 11:14 AM  Result Value Ref Range   Sodium 136 135 - 145 mmol/L   Potassium 4.0 3.5 - 5.1 mmol/L   Chloride 98 98 - 111 mmol/L   CO2 27 22 - 32 mmol/L   Glucose, Bld 52 (L) 70 - 99 mg/dL    Comment: Glucose reference range applies only to samples taken after fasting for at least 8 hours.   BUN 11 6 - 20 mg/dL   Creatinine, Ser 9.65 (L) 0.44 - 1.00 mg/dL   Calcium  9.2 8.9 - 10.3 mg/dL   Total Protein 7.0 6.5 - 8.1 g/dL   Albumin 3.4 (L) 3.5 - 5.0 g/dL   AST 86 (H) 15 - 41 U/L   ALT 143 (H) 0 - 44 U/L   Alkaline Phosphatase 197 (H) 38 - 126 U/L   Total Bilirubin 0.4 0.0 - 1.2 mg/dL   GFR, Estimated >39 >39 mL/min    Comment: (NOTE) Calculated using the CKD-EPI Creatinine Equation (2021)    Anion gap 11 5 - 15    Comment: Performed at [facility], [address], [place], [place] [address]  Lipase, blood     Status: None   Collection Time: 03/18/24 11:14 AM  Result Value Ref Range   Lipase 29 11 - 51 U/L    Comment: Performed at [facility], [address] [place], [place] [address]  D-dimer, quantitative     Status: Abnormal   Collection Time: 03/18/24 11:14 AM  Result Value Ref Range   D-Dimer, Quant 1.58 (H) 0.00 - 0.50 ug/mL-FEU    Comment: (NOTE) At the manufacturer cut-off value of 0.5 g/mL FEU, this assay has a negative predictive value of 95-100%.This assay is intended for use in conjunction with a clinical pretest probability (PTP) assessment model to exclude pulmonary embolism (PE) and deep venous thrombosis (DVT) in outpatients suspected of  PE or DVT. Results should be correlated with clinical presentation. Performed at [facility], [address] [place], [place] [address]   Urinalysis, Routine w reflex microscopic -Urine, Clean Catch     Status: Abnormal   Collection Time: 03/18/24 11:22 AM  Result Value Ref Range   Color, Urine YELLOW YELLOW   APPearance CLOUDY (A) CLEAR   Specific Gravity, Urine 1.011 1.005 - 1.030   pH 5.0 5.0 -  8.0   Glucose, UA NEGATIVE NEGATIVE mg/dL   Hgb urine dipstick NEGATIVE NEGATIVE   Bilirubin Urine NEGATIVE NEGATIVE   Ketones, ur NEGATIVE NEGATIVE mg/dL   Protein, ur NEGATIVE NEGATIVE mg/dL   Nitrite NEGATIVE NEGATIVE   Leukocytes,Ua LARGE (A) NEGATIVE   RBC / HPF 11-20 0 - 5 RBC/hpf   WBC, UA >50 0 - 5 WBC/hpf   Bacteria, UA NONE SEEN NONE SEEN   Squamous Epithelial / HPF 6-10 0 - 5 /HPF   Mucus PRESENT    Budding Yeast PRESENT     Comment: Performed at [facility], [address], [place], [place] [address]  CBG monitoring, ED     Status: Abnormal   Collection Time: 03/18/24  1:20 PM  Result Value Ref Range   Glucose-Capillary 33 (LL) 70 - 99 mg/dL    Comment: Glucose reference range applies only to samples taken after fasting for at least 8 hours.   Comment 1 Notify RN   CBG monitoring, ED     Status: Abnormal   Collection Time: 03/18/24  1:58 PM  Result Value Ref Range   Glucose-Capillary 101 (H) 70 - 99 mg/dL    Comment: Glucose reference range applies only to samples taken after fasting for at least 8 hours.  CBG monitoring, ED     Status: None   Collection Time: 03/18/24  3:35 PM  Result Value Ref Range   Glucose-Capillary 84 70 - 99 mg/dL    Comment: Glucose reference range applies only to samples taken after fasting for at least 8 hours.  Glucose, capillary     Status: None   Collection Time: 03/18/24  4:18 PM  Result Value Ref Range   Glucose-Capillary 77 70 - 99 mg/dL    Comment: Glucose reference range applies only to samples taken after fasting for at least 8 hours.  Culture, blood (Routine X 2) w Reflex to ID Panel     Status: None (Preliminary result)   Collection Time: 03/18/24  4:20 PM   Specimen: Left Antecubital; Blood  Result Value Ref Range   Specimen Description      LEFT ANTECUBITAL BOTTLES DRAWN AEROBIC AND ANAEROBIC   Special Requests Blood Culture adequate volume    Culture      NO GROWTH 2 DAYS Performed at [facility], [address],  [place], [place] [address]    Report Status PENDING   Culture, blood (Routine X 2) w Reflex to ID Panel     Status: None (Preliminary result)   Collection Time: 03/18/24  4:20 PM   Specimen: BLOOD RIGHT HAND  Result Value Ref Range   Specimen Description      BLOOD RIGHT HAND BOTTLES DRAWN AEROBIC AND ANAEROBIC   Special Requests Blood Culture adequate volume    Culture      NO GROWTH 2 DAYS Performed at [facility], [address], [place], [place] [address]    Report Status PENDING   CBC     Status: Abnormal   Collection Time: 03/18/24  4:20 PM  Result Value Ref Range   WBC 8.6 4.0 - 10.5 K/uL   RBC 3.51 (L) 3.87 - 5.11 MIL/uL   Hemoglobin 10.8 (L) 12.0 - 15.0 g/dL   HCT 66.7 (L) 63.9 - 53.9 %   MCV 94.6 80.0 - 100.0 fL   MCH 30.8 26.0 - 34.0 pg   MCHC 32.5 30.0 - 36.0 g/dL   RDW 87.7 88.4 - 84.4 %   Platelets 310 150 - 400 K/uL   nRBC 0.0 0.0 - 0.2 %    Comment: Performed at [facility], [address], [place], [place] [address]  Glucose, capillary     Status: Abnormal   Collection Time: 03/18/24  7:45 PM  Result Value Ref Range   Glucose-Capillary 190 (H) 70 - 99 mg/dL    Comment: Glucose reference range applies only to samples taken after fasting for at least 8 hours.  Glucose, capillary     Status: None   Collection Time: 03/19/24  3:03 AM  Result Value Ref Range   Glucose-Capillary 92 70 - 99 mg/dL    Comment: Glucose reference range applies only to samples taken after fasting for at least 8 hours.  Brain natriuretic peptide     Status: None   Collection Time: 03/19/24  5:07 AM  Result Value Ref Range   B Natriuretic Peptide 54.0 0.0 - 100.0 pg/mL    Comment: Performed at [facility], [address], [place], [place] [address]  Comprehensive metabolic panel     Status: Abnormal   Collection Time: 03/19/24  5:07 AM  Result Value Ref Range   Sodium 135 135 - 145 mmol/L   Potassium 3.9 3.5 - 5.1 mmol/L   Chloride 100 98 - 111 mmol/L   CO2 26 22 - 32 mmol/L   Glucose,  Bld 88 70 - 99 mg/dL    Comment: Glucose reference range applies only to samples taken after fasting for at least 8 hours.   BUN 11 6 - 20 mg/dL   Creatinine, Ser 9.57 (L) 0.44 - 1.00 mg/dL   Calcium  8.4 (L) 8.9 - 10.3 mg/dL   Total Protein 5.6 (L) 6.5 - 8.1 g/dL   Albumin 2.8 (L) 3.5 - 5.0 g/dL   AST 58 (H) 15 - 41 U/L   ALT 98 (H) 0 - 44 U/L   Alkaline Phosphatase 160 (H) 38 - 126 U/L   Total Bilirubin 0.4 0.0 - 1.2 mg/dL   GFR, Estimated >39 >39 mL/min    Comment: (NOTE) Calculated using the CKD-EPI Creatinine Equation (2021)    Anion gap 9 5 - 15    Comment: Performed at [facility], [address], [place], [place] [address]  CBC     Status: Abnormal   Collection Time: 03/19/24  5:07 AM  Result Value Ref Range   WBC 6.5 4.0 - 10.5 K/uL   RBC 3.33 (L) 3.87 - 5.11 MIL/uL   Hemoglobin 10.1 (L) 12.0 - 15.0 g/dL   HCT 68.4 (L) 63.9 - 53.9 %   MCV 94.6 80.0 - 100.0 fL   MCH 30.3 26.0 - 34.0 pg   MCHC 32.1 30.0 - 36.0 g/dL   RDW 87.8 88.4 - 84.4 %   Platelets 276 150 - 400 K/uL   nRBC 0.0 0.0 - 0.2 %    Comment: Performed at [facility], [address], [place], [place] [address]  Glucose, capillary     Status: None   Collection Time: 03/19/24  7:22 AM  Result Value Ref Range  Glucose-Capillary 95 70 - 99 mg/dL    Comment: Glucose reference range applies only to samples taken after fasting for at least 8 hours.   Comment 1 Document in Chart   Glucose, capillary     Status: None   Collection Time: 03/19/24 11:40 AM  Result Value Ref Range   Glucose-Capillary 73 70 - 99 mg/dL    Comment: Glucose reference range applies only to samples taken after fasting for at least 8 hours.   Comment 1 Document in Chart   MRSA Next Gen by PCR, Nasal     Status: None   Collection Time: 03/19/24  2:40 PM   Specimen: Nasal Mucosa; Nasal Swab  Result Value Ref Range   MRSA by PCR Next Gen NOT DETECTED NOT DETECTED    Comment: (NOTE) The GeneXpert MRSA Assay (FDA approved for NASAL  specimens only), is one component of a comprehensive MRSA colonization surveillance program. It is not intended to diagnose MRSA infection nor to guide or monitor treatment for MRSA infections. Test performance is not FDA approved in patients less than 1 years old. Performed at [facility], [address] [place], [place] [address]   Glucose, capillary     Status: Abnormal   Collection Time: 03/19/24  4:17 PM  Result Value Ref Range   Glucose-Capillary 188 (H) 70 - 99 mg/dL    Comment: Glucose reference range applies only to samples taken after fasting for at least 8 hours.  Glucose, capillary     Status: Abnormal   Collection Time: 03/19/24  7:36 PM  Result Value Ref Range   Glucose-Capillary 190 (H) 70 - 99 mg/dL    Comment: Glucose reference range applies only to samples taken after fasting for at least 8 hours.  Glucose, capillary     Status: Abnormal   Collection Time: 03/20/24  2:03 AM  Result Value Ref Range   Glucose-Capillary 164 (H) 70 - 99 mg/dL    Comment: Glucose reference range applies only to samples taken after fasting for at least 8 hours.  Comprehensive metabolic panel     Status: Abnormal   Collection Time: 03/20/24  4:47 AM  Result Value Ref Range   Sodium 134 (L) 135 - 145 mmol/L   Potassium 3.7 3.5 - 5.1 mmol/L   Chloride 97 (L) 98 - 111 mmol/L   CO2 28 22 - 32 mmol/L   Glucose, Bld 165 (H) 70 - 99 mg/dL    Comment: Glucose reference range applies only to samples taken after fasting for at least 8 hours.   BUN 9 6 - 20 mg/dL   Creatinine, Ser 9.53 0.44 - 1.00 mg/dL   Calcium  8.7 (L) 8.9 - 10.3 mg/dL   Total Protein 6.1 (L) 6.5 - 8.1 g/dL   Albumin 3.1 (L) 3.5 - 5.0 g/dL   AST 44 (H) 15 - 41 U/L   ALT 88 (H) 0 - 44 U/L   Alkaline Phosphatase 165 (H) 38 - 126 U/L   Total Bilirubin 0.5 0.0 - 1.2 mg/dL   GFR, Estimated >39 >39 mL/min    Comment: (NOTE) Calculated using the CKD-EPI Creatinine Equation (2021)    Anion gap 9 5 - 15    Comment: Performed  at [facility], [address], [place], [place] [address]  CBC     Status: Abnormal   Collection Time: 03/20/24  4:47 AM  Result Value Ref Range   WBC 8.7 4.0 - 10.5 K/uL   RBC 3.48 (L) 3.87 - 5.11  MIL/uL   Hemoglobin 10.6 (L) 12.0 - 15.0 g/dL   HCT 67.4 (L) 63.9 - 53.9 %   MCV 93.4 80.0 - 100.0 fL   MCH 30.5 26.0 - 34.0 pg   MCHC 32.6 30.0 - 36.0 g/dL   RDW 87.9 88.4 - 84.4 %   Platelets 303 150 - 400 K/uL   nRBC 0.0 0.0 - 0.2 %    Comment: Performed at [facility], [address], [place], [place] [address]  Glucose, capillary     Status: Abnormal   Collection Time: 03/20/24  7:30 AM  Result Value Ref Range   Glucose-Capillary 175 (H) 70 - 99 mg/dL    Comment: Glucose reference range applies only to samples taken after fasting for at least 8 hours.    NM Hepato W/EF Result Date: 03/20/2024 CLINICAL DATA:  Cholelithiasis. RIGHT upper quadrant pain. No leukocytosis. Normal bilirubin is EXAM: NUCLEAR MEDICINE HEPATOBILIARY IMAGING TECHNIQUE: Sequential images of the abdomen were obtained out to 60 minutes following intravenous administration of radiopharmaceutical. Patient extremely claustrophobic.  Only 10 minutes of imaging. RADIOPHARMACEUTICALS:  5.4 mCi Tc-22m  Choletec IV COMPARISON:  CT and MRI 03/18/24 FINDINGS: Prompt clearance radiotracer from blood pool and homogeneous uptake in liver. Counts are evident in the common bile duct and small bowel by 10 minutes. The gallbladder appears to begin to fill. Exam was terminated after only 10 minutes imaging due to claustrophobia. IMPRESSION: 1. Limited exam due to short duration imaging as above. 2. Patent common bile duct. 3. Suggestion of early filling of the gallbladder. Electronically Signed   By: Jackquline Boxer M.D.   On: 03/20/2024 09:11   DG Chest Port 1 View Result Date: 03/19/2024 CLINICAL DATA:  59 year old [name] with pulmonary edema, heart failure. EXAM: PORTABLE CHEST 1 VIEW COMPARISON:  Chest CTA yesterday. FINDINGS:  Portable AP supine views at 0537 hours. Bilateral pulmonary edema, with small layering pleural effusions better demonstrated on CTA. Compared to portable chest yesterday there is decreased fluid in the right minor fissure. Pulmonary vascular congestion has not improved. Larger lung volumes. Stable cardiac size and mediastinal contours. No pneumothorax or consolidation. Visualized tracheal air column is within normal limits. Stable visualized osseous structures. Negative visible bowel gas. IMPRESSION: Ongoing pulmonary edema with larger lung volumes and mildly decreased appearance of pleural fluid. Electronically Signed   By: VEAR Hurst M.D.   On: 03/19/2024 06:14   MR ABDOMEN MRCP W WO CONTAST Result Date: 03/18/2024 CLINICAL DATA:  Cholelithiasis.  RIGHT upper quadrant pain. EXAM: MRI ABDOMEN WITHOUT AND WITH CONTRAST (INCLUDING MRCP) TECHNIQUE: Multiplanar multisequence MR imaging of the abdomen was performed both before and after the administration of intravenous contrast. Heavily T2-weighted images of the biliary and pancreatic ducts were obtained, and three-dimensional MRCP images were rendered by post processing. CONTRAST:  7mL GADAVIST GADOBUTROL 1 MMOL/ML IV SOLN COMPARISON:  Ultrasound same day, CT chest same day FINDINGS: Lower chest:  Bibasilar effusions and atelectasis. Hepatobiliary: No intrahepatic or extrahepatic biliary duct dilatation. No filling defect within the common bile duct. The common hepatic duct measures 8 mm. The common bile duct measures 5 mm. Large ovoid stone within the proximal gallbladder measures 3.6 cm. No pericholecystic fluid. No gallbladder wall thickening. Pancreas: Normal pancreatic parenchymal intensity. No ductal dilatation or inflammation. Spleen: Normal spleen. Adrenals/urinary tract: Adrenal glands and kidneys are normal. Stomach/Bowel: Stomach and limited of the small bowel is unremarkable Vascular/Lymphatic: Abdominal aortic normal caliber. No retroperitoneal periportal  lymphadenopathy. Musculoskeletal: No aggressive osseous lesion IMPRESSION: 1. Large  gallstone without evidence acute cholecystitis. 2. No choledocholithiasis. 3. No biliary duct dilatation. 4. Normal pancreas. 5. Bibasilar effusions and atelectasis. Electronically Signed   By: Jackquline Boxer M.D.   On: 03/18/2024 15:51   MR 3D Recon At Scanner Result Date: 03/18/2024 CLINICAL DATA:  Cholelithiasis.  RIGHT upper quadrant pain. EXAM: MRI ABDOMEN WITHOUT AND WITH CONTRAST (INCLUDING MRCP) TECHNIQUE: Multiplanar multisequence MR imaging of the abdomen was performed both before and after the administration of intravenous contrast. Heavily T2-weighted images of the biliary and pancreatic ducts were obtained, and three-dimensional MRCP images were rendered by post processing. CONTRAST:  7mL GADAVIST GADOBUTROL 1 MMOL/ML IV SOLN COMPARISON:  Ultrasound same day, CT chest same day FINDINGS: Lower chest:  Bibasilar effusions and atelectasis. Hepatobiliary: No intrahepatic or extrahepatic biliary duct dilatation. No filling defect within the common bile duct. The common hepatic duct measures 8 mm. The common bile duct measures 5 mm. Large ovoid stone within the proximal gallbladder measures 3.6 cm. No pericholecystic fluid. No gallbladder wall thickening. Pancreas: Normal pancreatic parenchymal intensity. No ductal dilatation or inflammation. Spleen: Normal spleen. Adrenals/urinary tract: Adrenal glands and kidneys are normal. Stomach/Bowel: Stomach and limited of the small bowel is unremarkable Vascular/Lymphatic: Abdominal aortic normal caliber. No retroperitoneal periportal lymphadenopathy. Musculoskeletal: No aggressive osseous lesion IMPRESSION: 1. Large gallstone without evidence acute cholecystitis. 2. No choledocholithiasis. 3. No biliary duct dilatation. 4. Normal pancreas. 5. Bibasilar effusions and atelectasis. Electronically Signed   By: Jackquline Boxer M.D.   On: 03/18/2024 15:51   US  Venous Img Lower  Unilateral Right Result Date: 03/18/2024 CLINICAL DATA:  Right lower extremity pain from the right hip to the right ankle. EXAM: RIGHT LOWER EXTREMITY VENOUS DOPPLER ULTRASOUND TECHNIQUE: Gray-scale sonography with graded compression, as well as color Doppler and duplex ultrasound were performed to evaluate the lower extremity deep venous systems from the level of the common femoral vein and including the common femoral, femoral, profunda femoral, popliteal and calf veins including the posterior tibial, peroneal and gastrocnemius veins when visible. The superficial great saphenous vein was also interrogated. Spectral Doppler was utilized to evaluate flow at rest and with distal augmentation maneuvers in the common femoral, femoral and popliteal veins. COMPARISON:  None Available. FINDINGS: Contralateral Common Femoral Vein: Respiratory phasicity is normal and symmetric with the symptomatic side. No evidence of thrombus. Normal compressibility. Common Femoral Vein: No evidence of thrombus. Normal compressibility, respiratory phasicity and response to augmentation. Saphenofemoral Junction: No evidence of thrombus. Normal compressibility and flow on color Doppler imaging. Profunda Femoral Vein: No evidence of thrombus. Normal compressibility and flow on color Doppler imaging. Femoral Vein: No evidence of thrombus. Normal compressibility, respiratory phasicity and response to augmentation. Popliteal Vein: No evidence of thrombus. Normal compressibility, respiratory phasicity and response to augmentation. Calf Veins: No evidence of thrombus. Normal compressibility and flow on color Doppler imaging. Superficial Great Saphenous Vein: No evidence of thrombus. Normal compressibility. Venous Reflux:  None. Other Findings:  None. IMPRESSION: No evidence of deep venous thrombosis within the RIGHT lower extremity. Electronically Signed   By: Suzen Dials M.D.   On: 03/18/2024 14:42   CT Angio Chest PE W and/or Wo  Contrast Result Date: 03/18/2024 EXAM: CTA CHEST 03/18/2024 12:56:00 PM TECHNIQUE: CTA of the chest was performed without and with the administration of 75 mL of intravenous contrast (iohexol  (OMNIPAQUE ) 350 MG/ML injection). Multiplanar reformatted images are provided for review. MIP images are provided for review. Automated exposure control, iterative reconstruction, and/or weight based adjustment of the mA/kV was  utilized to reduce the radiation dose to as low as reasonably achievable. COMPARISON: 10/26/2023 CLINICAL HISTORY: Pulmonary embolism (PE) suspected, low to intermediate prob, positive D-dimer. PE suspected; Positive D-dimer; Omnipaque  350; 75 cc; bib ccems from [facility] for RUQ pain x2 days that has progressively gotten worse. Hx of gallstones. C/o of nausea also. FINDINGS: PULMONARY ARTERIES: Pulmonary arteries are adequately opacified for evaluation. No acute pulmonary embolus. Main pulmonary artery is normal in caliber. MEDIASTINUM: The heart and pericardium demonstrate multivessel coronary artery calcifications. There is aortic atherosclerosis. No acute abnormality of the thoracic aorta. LYMPH NODES: Enlarged mediastinal and bilateral hilar lymph nodes are identified. For example, right hilar node measures 1.7 cm (image 127/5), previously 1.5 cm. Left paratracheal lymph node measures 1.7 cm, previously 1 cm. Subcarinal node measures 1.6 cm (image 134/5), previously 1.4 cm. LUNGS AND PLEURA: Small bilateral pleural effusions, left greater than right. No pneumothorax identified. Diffuse interlobular septal thickening with ground glass attenuation noted throughout both lungs compatible with pulmonary edema. Bilateral lower lobe airspace consolidation concerning for pneumonia or aspiration. Persistent non-solid nodule within the left upper lobe containing ground glass and cystic components is stable measuring 1.3 cm (image 58/6). UPPER ABDOMEN: Large calcified stone within the gallbladder neck  measures 3.7 cm. SOFT TISSUES AND BONES: No acute bone or soft tissue abnormality. No acute or suspicious osseous findings. IMPRESSION: 1. No evidence of pulmonary embolism. 2. Bilateral pleural effusions and moderate diffuse pulmonary edema. 3. Bilateral lower lobe airspace consolidation concerning for pneumonia or aspiration. 4. Enlarged mediastinal and bilateral hilar lymph nodes, including right hilar (1.7 cm), left paratracheal (1.7 cm), and subcarinal (1.6 cm) nodes. Likely reactive in the setting of chf and pneumonia. 5. Large calcified gallstone within the gallbladder neck measuring 3.7 cm. 6. Aortic atherosclerosis and multivessel coronary artery calcifications. Electronically signed by: Waddell Calk MD 03/18/2024 01:35 PM EDT RP Workstation: [id number]   US  ABDOMEN LIMITED RUQ (LIVER/GB) Result Date: 03/18/2024 EXAM: Right Upper Quadrant Abdominal Ultrasound 03/18/2024 12:29:49 PM TECHNIQUE: Real-time ultrasonography of the right upper quadrant of the abdomen was performed. COMPARISON: CT of the abdomen and pelvis from earlier today. CLINICAL HISTORY: RUQ abdominal pain. FINDINGS: LIVER: Increased parenchymal echogenicity of the liver. The portal vein is patent with normal flow towards the liver. No intrahepatic biliary ductal dilatation. No evidence of mass. BILIARY SYSTEM: The gallbladder wall measures 3.5 mm in thickness. There is a large shadowing stone within the gallbladder neck measuring 3.4 cm. No pericholecystic fluid or sonographic Murphy's sign. The common bile duct is increasing caliber measuring 9 mm. No stones identified within the visualized proximal portion of the CBD. RIGHT KIDNEY: The right kidney is grossly unremarkable in appearances without evidence of hydronephrosis, echogenic calculi or worrisome mass lesions. PANCREAS: The pancreas is not well visualized. OTHER: Small right pleural effusion is incidentally noted. No right upper quadrant ascites. IMPRESSION: 1. Large shadowing  gallstone within the gallbladder neck measuring 3.4 cm. The gallbladder wall is mildly thickened, measuring 3.5 mm. No pericholecystic fluid or sonographic murphy sign. 2. Mildly dilated common bile duct measuring 9 mm without visible choledocholithiasis in the visualized proximal segment; correlate with liver function tests and consider further evaluation with MRCP if clinically indicated. 3. Increased hepatic parenchymal echogenicity, compatible with hepatic steatosis. 4. Small right pleural effusion incidentally noted. Electronically signed by: Waddell Calk MD 03/18/2024 12:51 PM EDT RP Workstation: [id number]   CT Renal Stone Study Result Date: 03/18/2024 CLINICAL DATA:  Abdominal/flank pain, stone suspected EXAM: CT ABDOMEN AND PELVIS WITHOUT  CONTRAST TECHNIQUE: Multidetector CT imaging of the abdomen and pelvis was performed following the standard protocol without IV contrast. RADIATION DOSE REDUCTION: This exam was performed according to the departmental dose-optimization program which includes automated exposure control, adjustment of the mA and/or kV according to patient size and/or use of iterative reconstruction technique. COMPARISON:  January 24, 2024 FINDINGS: Evaluation is limited by lack of IV contrast. Lower chest: Small bilateral pleural effusions with bibasilar heterogeneous airspace opacities and bronchial wall thickening. Mild interlobular septal thickening. Hepatobiliary: Large cholelithiasis. The gallbladder is distended. No definitive pericholecystic fluid. There is a single with area of fat stranding along the inferior margin of the gallbladder, nonspecific (series 5, image 35). Fluid. No extrahepatic biliary ductal dilation. Periportal edema. Pancreas: No new peripancreatic fat stranding. Relatively atrophic appearance of the pancreas. Spleen: Unremarkable. Adrenals/Urinary Tract: Adrenal glands are unremarkable. No hydronephrosis. No obstructing nephrolithiasis. Circumferential wall  thickening with faint adjacent fat stranding of the bladder. Stomach/Bowel: No evidence of bowel obstruction. Appendix is unremarkable. Stomach is decompressed, limiting evaluation. Favor small hiatal hernia. Vascular/Lymphatic: Atherosclerotic calcifications of the nonaneurysmal abdominal aorta. Prominent retroperitoneal lymph nodes with representative LEFT periaortic lymph node measuring 9 mm in the short axis, similar compared to prior (series 2, image 28). Perigastric lymph node measuring 12 mm in the short axis, previously 11 mm (series 2, image 15). Aortocaval node measuring 8 mm in the short axis, unchanged (series 2, image 29). Representative RIGHT inguinal lymph node measures 13 mm in the short axis, similar compared to prior (series 2, image 86). Reproductive: Status post hysterectomy. No adnexal masses. Other: Trace free fluid in the pelvis.  Anasarca. Musculoskeletal: Advanced degenerative changes of bilateral hips. Linear horizontal lucency traversing the RIGHT sacrum (series 5, image 85). IMPRESSION: 1. No hydronephrosis or obstructing nephrolithiasis. 2. Circumferential wall thickening of the bladder with faint adjacent fat stranding. Recommend correlation with urinalysis to exclude cystitis. 3. Large cholelithiasis with distended gallbladder. There is a single focus of fat stranding along the inferior margin of the gallbladder, nonspecific in a background of anasarca and favored to be incidental. If there is persistent clinical concern for acute cholecystitis, HIDA scan would be the modality of choice for further evaluation. 4. Small bilateral pleural effusions with bibasilar heterogeneous airspace opacities and bronchial wall thickening. Findings are favored to reflect pulmonary edema with superimposed atelectasis. 5. Linear horizontal lucency traversing the RIGHT sacrum. This could reflect a nondisplaced fracture versus prominent nutrient foramen. Recommend correlation with point tenderness. 6.  Prominent retroperitoneal and inguinal adenopathy, nonspecific and possibly reactive. Aortic Atherosclerosis (ICD10-I70.0). Electronically Signed   By: Corean Salter M.D.   On: 03/18/2024 12:07   DG Chest Port 1 View Result Date: 03/18/2024 EXAM: 1 VIEW(S) XRAY OF THE CHEST 03/18/2024 11:00:00 AM COMPARISON: 02/14/2024 CLINICAL HISTORY: hypoxia. Per chart; Pt bib ccems from [facility] for RUQ pain x2 days that has progressively gotten worse. Hx of gallstones. C/o of nausea also. FINDINGS: LUNGS AND PLEURA: Small bilateral pleural effusions. Mild interstitial edema. Bibasilar atelectasis. No focal pulmonary opacity. No pneumothorax. HEART AND MEDIASTINUM: No acute abnormality of the cardiac and mediastinal silhouettes. BONES AND SOFT TISSUES: No acute osseous abnormality. IMPRESSION: 1. Small bilateral pleural effusions. 2. Mild interstitial edema. 3. Bibasilar atelectasis. Electronically signed by: Waddell Calk MD 03/18/2024 11:13 AM EDT RP Workstation: [id number]    ROS:  Review of systems not obtained due to patient factors.  Blood pressure (!) 103/59, pulse 80, temperature 98.1 F (36.7 C), temperature source Oral, resp. rate 18, height  5' 9 (1.753 m), weight 75.3 kg, SpO2 91%. Physical Exam: Pleasant white [name] in no acute distress.  Nancy does seem to have some anxiety baseline. Eyes are without scleral icterus Head is normocephalic, atraumatic Abdomen is soft with minimal discomfort in the right upper quadrant.  No rigidity is noted.  No masses are noted.  Hospital course reviewed  Assessment/Plan: Impression: Probable biliary colic secondary to cholelithiasis, bilateral pneumonia with evidence of pleural effusions, pulmonary edema, and heart failure.  Liver enzyme tests are improving.  At this point, patient not a good surgical candidate for general anesthesia given her pulmonary findings.  No need for cholecystostomy tube at the present time as the patient is improving.  Would  advance to the carb modified diet.  The gallbladder can be addressed as an outpatient.  This was discussed with Dr. Vicci.  Oneil Budge 03/20/2024, 9:24 AM

## 2024-03-20 NOTE — Progress Notes (Signed)
 Heart Failure Nurse Navigator Progress Note  PCP: Earvin Johnston PARAS, FNP PCP-Cardiologist: None Admission Diagnosis: Abdominal Pain Admitted from: SNF @ [facility] via [facility].   [facility]: Patient at [facility]. 2 Patient identifiers confirmed prior to phone conversation with patient in room 332.  Confirmed with Dr. Vicci that he did want a CHF education provided to patient and TOC scheduled.  Presentation:   Nancy [name] presented with acute flank pain that radiated down her back and into her leg.  She was 84% on room air and is not normally on oxygen at baseline. She was hospitalized 1 month prior and went from hospital to SNF (due to fall, pyelonephritis, hypotension and hypoglycemia).  She has a history of chronic alcohol abuse detoxed 1 month ago.  On Suboxone therapy from SNF.  Acute HFpEF, evidence of pulmonary edema and bilateral pleural effusions. BNP 54. Chest x-ray:ongoing pulmonary edema with larger lung volumes and mildly decreased appearance of pleural fluid.  ECHO/ LVEF: 60-65% Grade 1 diastolic dysfunction (impaired relaxation)  Clinical Course:  Past Medical History:  Diagnosis Date   Abnormal Pap smear    Bronchitis    Depression    Diabetes (HCC)    GERD (gastroesophageal reflux disease)    tums prn   Headache(784.0)    Polysubstance abuse (HCC)      Social History   Socioeconomic History   Marital status: Single    Spouse name: Not on file   Number of children: Not on file   Years of education: Not on file   Highest education level: 9th grade  Occupational History   Not on file  Tobacco Use   Smoking status: Every Day    Current packs/day: 1.00    Average packs/day: 1 pack/day for 20.0 years (20.0 ttl pk-yrs)    Types: Cigarettes   Smokeless tobacco: Never  Substance and Sexual Activity   Alcohol use: Not Currently    Alcohol/week: 7.0 standard drinks of alcohol    Types: 7 Cans of beer per week     Comment: couple of beers a week prior to SNF   Drug use: Not Currently    Types: Crack cocaine, Marijuana    Comment: Pills(Vicodin) heroin (states no today 09/04/15)   Sexual activity: Yes    Birth control/protection: None    Comment: Pt in rehab for drug and alcohol use  Other Topics Concern   Not on file  Social History Narrative   Not on file   Social Drivers of Health   Financial Resource Strain: Not on File (10/08/2021)   Received from [employer]    Financial Resource Strain: 0  Food Insecurity: No Food Insecurity (03/20/2024)   Hunger Vital Sign    Worried About Running Out of Food in the Last Year: Never true    Ran Out of Food in the Last Year: Never true  Recent Concern: Food Insecurity - Food Insecurity Present (01/24/2024)   Hunger Vital Sign    Worried About Running Out of Food in the Last Year: Often true    Ran Out of Food in the Last Year: Often true  Transportation Needs: Unmet Transportation Needs (03/18/2024)   PRAPARE - Transportation    Lack of Transportation (Medical): Yes    Lack of Transportation (Non-Medical): Yes  Physical Activity: Not on File (10/08/2021)   Received from [facility]   Physical Activity    Physical Activity: 0  Stress: Not on  File (10/08/2021)   Received from [facility]   Stress    Stress: 0  Social Connections: Socially Isolated (03/18/2024)   Social Connection and Isolation Panel    Frequency of Communication with Friends and Family: Once a week    Frequency of Social Gatherings with Friends and Family: Never    Attends Religious Services: Never    Diplomatic Services operational officer: No    Attends Engineer, structural: Never    Marital Status: Separated   Education Assessment and Provision:  Detailed education and instructions provided on heart failure disease management including the following:  Signs and symptoms of Heart Failure When to call the physician Importance of daily weights Low sodium  diet Fluid restriction Medication management Anticipated future follow-up appointments  Patient education given on each of the above topics.  Patient acknowledges understanding via teach back method and acceptance of all instructions.  Education Materials:  Living Better With Heart Failure Booklet, HF zone tool, & Daily Weight Tracker Tool provided to patient by bedside RN today.  Patient has scale at home: Scale at [facility].  Patient says they do weigh her daily already. Patient has pill box at home: SNF provides patent with daily medications.    High Risk Criteria for Readmission and/or Poor Patient Outcomes: Heart failure hospital admissions (last 6 months): 0  No Show rate: 29% Difficult social situation: Yk:Dladujwrz abuse currently on Suboxone.  Detoxed from alcohol 1 month ago.   Demonstrates medication adherence: SNF provides patient with daily medications. Primary Language: English Literacy level: 9th Grade. Has difficulty seeing to read.  Barriers of Care:   Diet provided by SNF so unsure of daily salt intake. Patient is currently below 64 oz of fluid per day.   Transportation -patient will use the transportation provided from SNF to get to her TOC appt.  Considerations/Referrals:  Referral made to Heart Failure Pharmacist Stewardship: N/A Referral made to Heart Failure CSW/NCM TOC: No Referral made to Heart & Vascular TOC clinic: Yes.  First transportation available for patient from the [facility] is 04/10/24 @ 12:00.  They will take her to this appointment.   Items for Follow-up on DC/TOC: Daily Weights Diet & Fluid Restrictions Continued Heart Failure Education  Charmaine Pines, RN, BSN [facility] Heart Failure Navigator Secure Chat Only

## 2024-03-20 NOTE — Progress Notes (Signed)
 Pt requested lidocaine  patch for constant upper left back pain. Notified MD Segars, request granted.Complained of feeling ill and nausea. V/S was within range, gave prn Zofran . Tolerated patch and zofran  well. Pt resting quietly, rise and fall of chest visualized.

## 2024-03-20 NOTE — Progress Notes (Addendum)
 PROGRESS NOTE   Nancy [name]  FMW:[id number] DOB: 26-Apr-1966 DOA: 03/18/2024 PCP: Earvin Johnston PARAS, FNP   Chief Complaint  Patient presents with   Abdominal Pain   Level of care: Med-Surg  Brief Admission History:  58 year old female with history of alcohol use disorder, detoxed approximately 1 month ago and currently residing at [place] [facility], recently discharged from [facility] family medicine service 1 month ago after being treated for pyelonephritis, past medical history significant for hepatic steatosis, substance abuse, currently on Suboxone, poorly controlled type 2 diabetes mellitus with hyperglycemia, osteoarthritis of the hips, hypoxia, history of DKA, depression and anxiety, chronic constipation who was brought in by [facility] from [place] [facility] for 2 days of colicky right upper quadrant abdominal pain that is progressively worsened.  Patient reportedly has a history of cholelithiasis.  Patient has complained of nausea but no emesis.  Patient reports she had a bout of severe flank pain on the right side that radiated down into her back and right leg.  Patient denies chest pain and shortness of breath.  Patient denies dysuria.  Patient denies fever and chills.  On arrival to the ED she was noted to be hypoxic with a pulse ox of 84% on room air.  Her workup in the ED significant for hypoglycemia, blood sugar 33, CTA chest PE with findings of bilateral pleural effusions and moderate bilateral pulmonary edema, no PE found, bilateral lower lobe airspace disease suggesting pneumonia and/or aspiration.  Abdominal ultrasound with findings of large gallstone within the gallbladder neck measuring 3.4 cm, Mildly dilated common bile duct measuring 9 mm without visible choledocholithiasis in the visualized proximal segment, gallbladder enlargement, bladder wall thickening with concern for cystitis, hepatic steatosis.  She was started on IV antibiotics for pneumonia and admission requested for  further management.    Assessment and Plan:  Colicky RUQ abdominal pain -- improving  -- Patient presenting with symptoms of biliary colic -- Concerning findings of acute elevation of LFTs -- Imaging findings of cholelithiasis and common bile duct dilatation -- MRCP ordered for further clarification to rule out choledocholithiasis -- HIDA scan was unable to be completed on 9/29 due to severe claustrophobia  -- Agree with IV antibiotics, IV metronidazole  to regimen -- Further recommendations to follow pending results of imaging studies -- Continue supportive care -- full liquids tolerated, advanced to soft diet  -- surgery consulted, recommend outpatient follow up    Elevated LFTs--trending down [improving] -- choledocholithiasis ruled out, she may have passed a gallstone  -- Patient does have a history of hepatic steatosis but does not have a history of elevated LFTs -- This is suggesting that this was an acute LFT rise -- Follow-up MRCP findings done with and without contrast - negative for choledocholithiasis -- Follow daily LFTs -- Lipase reassuring to rule out pancreatitis   Uncontrolled type 2 diabetes mellitus with hypoglycemia Hypoglycemia --The hypoglycemia has been corrected -- Added SSI coverage with CBG monitoring 5 times per day, added prandial coverage as diet advances -- uncontrolled as evidenced by A1c>14%  CBG (last 3)  Recent Labs    03/20/24 0203 03/20/24 0730 03/20/24 1127  GLUCAP 164* 175* 203*    Bilateral pneumonia -- Provide coverage for HCAP and aspiration given image findings -- Continue IV antibiotics as ordered -- Continue supportive measures -- Obtain blood cultures x 2   Acute HFpEF -- evidence of pulmonary edema and bilateral pleural effusions on imaging -- IV lasix  ordered 20 mg TID -- reduced dosing  due to soft BPs -- Monitor daily weights, intake and output, electrolytes and ReDs vest readings -- continue IV lasix  to 30  mg TID, but  reduce now to BID     Chronic pain Opioid dependence -- pt reports she is on subutex -- resume when meds reconciled -- may have to use oxycodone  since we do not have her dose of subutex in hospital -- use nonopioid modalities for pain mgmt as much as possible -- toradol  ordered for severe pain   Tobacco use -- nicotine  patch 21 mg daily ordered    GERD  -- pantoprazole ordered for GI protection   DVT prophylaxis: SQ heparin Code Status: Full  Family Communication:  Disposition: return to [facility]   Consultants:  surgery Procedures:   Antimicrobials:  Anti-infectives (From admission, onward)    Start     Dose/Rate Route Frequency Ordered Stop   03/19/24 0600  vancomycin (VANCOCIN) IVPB 1000 mg/200 mL premix  Status:  Discontinued        1,000 mg 200 mL/hr over 60 Minutes Intravenous Every 12 hours 03/18/24 1544 03/20/24 1129   03/19/24 0000  ceFEPIme (MAXIPIME) 2 g in sodium chloride  0.9 % 100 mL IVPB        2 g 200 mL/hr over 30 Minutes Intravenous Every 8 hours 03/18/24 1543     03/18/24 1500  metroNIDAZOLE  (FLAGYL ) IVPB 500 mg        500 mg 100 mL/hr over 60 Minutes Intravenous Every 12 hours 03/18/24 1434     03/18/24 1430  vancomycin (VANCOREADY) IVPB 1500 mg/300 mL        1,500 mg 150 mL/hr over 120 Minutes Intravenous  Once 03/18/24 1416 03/18/24 1812   03/18/24 1415  vancomycin (VANCOCIN) IVPB 1000 mg/200 mL premix  Status:  Discontinued        1,000 mg 200 mL/hr over 60 Minutes Intravenous  Once 03/18/24 1413 03/18/24 1416   03/18/24 1415  ceFEPIme (MAXIPIME) 2 g in sodium chloride  0.9 % 100 mL IVPB        2 g 200 mL/hr over 30 Minutes Intravenous  Once 03/18/24 1413 03/18/24 1641        Subjective: Pt says she has been urinating frequent on the IV lasix , less SOB, still having weakness and not out of bed much but willing. Pt tolerating diet and wanting to advance   Objective: Vitals:   03/19/24 1500 03/19/24 2040 03/20/24 0325 03/20/24 0451   BP: 105/62 (!) 110/59  (!) 103/59  Pulse: 91 82  80  Resp: 18 18  18   Temp: 98.7 F (37.1 C) 97.6 F (36.4 C)  98.1 F (36.7 C)  TempSrc: Oral Oral  Oral  SpO2: 92% 92%  91%  Weight:   75.3 kg   Height:        Intake/Output Summary (Last 24 hours) at 03/20/2024 1135 Last data filed at 03/20/2024 0958 Gross per 24 hour  Intake 906.53 ml  Output 3100 ml  Net -2193.47 ml   Filed Weights   03/18/24 1041 03/19/24 0440 03/20/24 0325  Weight: 68 kg 75.4 kg 75.3 kg   Examination:  General exam: Appears calm and comfortable  Respiratory system: Clear to auscultation. Respiratory effort normal. Cardiovascular system: normal S1 & S2 heard. No JVD, murmurs, rubs, gallops or clicks. No pedal edema. Gastrointestinal system: Abdomen is nondistended, soft and mildly tender in the RUQ. No organomegaly or masses felt. Normal bowel sounds heard. Central nervous system: Alert and oriented. No focal  neurological deficits. Extremities: Symmetric 5 x 5 power. Skin: No rashes, lesions or ulcers. Psychiatry: Judgement and insight appear normal. Mood & affect appropriate.   Data Reviewed: I have personally reviewed following labs and imaging studies  CBC: Recent Labs  Lab 03/18/24 1114 03/18/24 1620 03/19/24 0507 03/20/24 0447  WBC 9.3 8.6 6.5 8.7  NEUTROABS 6.1  --   --   --   HGB 11.5* 10.8* 10.1* 10.6*  HCT 35.8* 33.2* 31.5* 32.5*  MCV 94.5 94.6 94.6 93.4  PLT 307 310 276 303    Basic Metabolic Panel: Recent Labs  Lab 03/18/24 1114 03/19/24 0507 03/20/24 0447  NA 136 135 134*  K 4.0 3.9 3.7  CL 98 100 97*  CO2 27 26 28   GLUCOSE 52* 88 165*  BUN 11 11 9   CREATININE 0.34* 0.42* 0.46  CALCIUM  9.2 8.4* 8.7*    CBG: Recent Labs  Lab 03/19/24 1617 03/19/24 1936 03/20/24 0203 03/20/24 0730 03/20/24 1127  GLUCAP 188* 190* 164* 175* 203*    Recent Results (from the past 240 hours)  Culture, blood (Routine X 2) w Reflex to ID Panel     Status: None (Preliminary result)    Collection Time: 03/18/24  4:20 PM   Specimen: Left Antecubital; Blood  Result Value Ref Range Status   Specimen Description   Final    LEFT ANTECUBITAL BOTTLES DRAWN AEROBIC AND ANAEROBIC   Special Requests Blood Culture adequate volume  Final   Culture   Final    NO GROWTH 2 DAYS Performed at [facility], [address] [place], [place] [address]    Report Status PENDING  Incomplete  Culture, blood (Routine X 2) w Reflex to ID Panel     Status: None (Preliminary result)   Collection Time: 03/18/24  4:20 PM   Specimen: BLOOD RIGHT HAND  Result Value Ref Range Status   Specimen Description   Final    BLOOD RIGHT HAND BOTTLES DRAWN AEROBIC AND ANAEROBIC   Special Requests Blood Culture adequate volume  Final   Culture   Final    NO GROWTH 2 DAYS Performed at [facility], [address], [place], [place] [address]    Report Status PENDING  Incomplete  MRSA Next Gen by PCR, Nasal     Status: None   Collection Time: 03/19/24  2:40 PM   Specimen: Nasal Mucosa; Nasal Swab  Result Value Ref Range Status   MRSA by PCR Next Gen NOT DETECTED NOT DETECTED Final    Comment: (NOTE) The GeneXpert MRSA Assay (FDA approved for NASAL specimens only), is one component of a comprehensive MRSA colonization surveillance program. It is not intended to diagnose MRSA infection nor to guide or monitor treatment for MRSA infections. Test performance is not FDA approved in patients less than 29 years old. Performed at [facility], [address] [place], [place] [address]      Radiology Studies: NM Hepato W/EF Result Date: 03/20/2024 CLINICAL DATA:  Cholelithiasis. RIGHT upper quadrant pain. No leukocytosis. Normal bilirubin is EXAM: NUCLEAR MEDICINE HEPATOBILIARY IMAGING TECHNIQUE: Sequential images of the abdomen were obtained out to 60 minutes following intravenous administration of radiopharmaceutical. Patient extremely claustrophobic.  Only 10 minutes of imaging. RADIOPHARMACEUTICALS:  5.4 mCi  Tc-87m  Choletec IV COMPARISON:  CT and MRI 03/18/24 FINDINGS: Prompt clearance radiotracer from blood pool and homogeneous uptake in liver. Counts are evident in the common bile duct and small bowel by 10 minutes. The gallbladder appears to begin to fill. Exam  was terminated after only 10 minutes imaging due to claustrophobia. IMPRESSION: 1. Limited exam due to short duration imaging as above. 2. Patent common bile duct. 3. Suggestion of early filling of the gallbladder. Electronically Signed   By: Jackquline Boxer M.D.   On: 03/20/2024 09:11   DG Chest Port 1 View Result Date: 03/19/2024 CLINICAL DATA:  58 year old female with pulmonary edema, heart failure. EXAM: PORTABLE CHEST 1 VIEW COMPARISON:  Chest CTA yesterday. FINDINGS: Portable AP supine views at 0537 hours. Bilateral pulmonary edema, with small layering pleural effusions better demonstrated on CTA. Compared to portable chest yesterday there is decreased fluid in the right minor fissure. Pulmonary vascular congestion has not improved. Larger lung volumes. Stable cardiac size and mediastinal contours. No pneumothorax or consolidation. Visualized tracheal air column is within normal limits. Stable visualized osseous structures. Negative visible bowel gas. IMPRESSION: Ongoing pulmonary edema with larger lung volumes and mildly decreased appearance of pleural fluid. Electronically Signed   By: VEAR Hurst M.D.   On: 03/19/2024 06:14   MR ABDOMEN MRCP W WO CONTAST Result Date: 03/18/2024 CLINICAL DATA:  Cholelithiasis.  RIGHT upper quadrant pain. EXAM: MRI ABDOMEN WITHOUT AND WITH CONTRAST (INCLUDING MRCP) TECHNIQUE: Multiplanar multisequence MR imaging of the abdomen was performed both before and after the administration of intravenous contrast. Heavily T2-weighted images of the biliary and pancreatic ducts were obtained, and three-dimensional MRCP images were rendered by post processing. CONTRAST:  7mL GADAVIST GADOBUTROL 1 MMOL/ML IV SOLN COMPARISON:   Ultrasound same day, CT chest same day FINDINGS: Lower chest:  Bibasilar effusions and atelectasis. Hepatobiliary: No intrahepatic or extrahepatic biliary duct dilatation. No filling defect within the common bile duct. The common hepatic duct measures 8 mm. The common bile duct measures 5 mm. Large ovoid stone within the proximal gallbladder measures 3.6 cm. No pericholecystic fluid. No gallbladder wall thickening. Pancreas: Normal pancreatic parenchymal intensity. No ductal dilatation or inflammation. Spleen: Normal spleen. Adrenals/urinary tract: Adrenal glands and kidneys are normal. Stomach/Bowel: Stomach and limited of the small bowel is unremarkable Vascular/Lymphatic: Abdominal aortic normal caliber. No retroperitoneal periportal lymphadenopathy. Musculoskeletal: No aggressive osseous lesion IMPRESSION: 1. Large gallstone without evidence acute cholecystitis. 2. No choledocholithiasis. 3. No biliary duct dilatation. 4. Normal pancreas. 5. Bibasilar effusions and atelectasis. Electronically Signed   By: Jackquline Boxer M.D.   On: 03/18/2024 15:51   MR 3D Recon At Scanner Result Date: 03/18/2024 CLINICAL DATA:  Cholelithiasis.  RIGHT upper quadrant pain. EXAM: MRI ABDOMEN WITHOUT AND WITH CONTRAST (INCLUDING MRCP) TECHNIQUE: Multiplanar multisequence MR imaging of the abdomen was performed both before and after the administration of intravenous contrast. Heavily T2-weighted images of the biliary and pancreatic ducts were obtained, and three-dimensional MRCP images were rendered by post processing. CONTRAST:  7mL GADAVIST GADOBUTROL 1 MMOL/ML IV SOLN COMPARISON:  Ultrasound same day, CT chest same day FINDINGS: Lower chest:  Bibasilar effusions and atelectasis. Hepatobiliary: No intrahepatic or extrahepatic biliary duct dilatation. No filling defect within the common bile duct. The common hepatic duct measures 8 mm. The common bile duct measures 5 mm. Large ovoid stone within the proximal gallbladder  measures 3.6 cm. No pericholecystic fluid. No gallbladder wall thickening. Pancreas: Normal pancreatic parenchymal intensity. No ductal dilatation or inflammation. Spleen: Normal spleen. Adrenals/urinary tract: Adrenal glands and kidneys are normal. Stomach/Bowel: Stomach and limited of the small bowel is unremarkable Vascular/Lymphatic: Abdominal aortic normal caliber. No retroperitoneal periportal lymphadenopathy. Musculoskeletal: No aggressive osseous lesion IMPRESSION: 1. Large gallstone without evidence acute cholecystitis. 2. No choledocholithiasis. 3. No  biliary duct dilatation. 4. Normal pancreas. 5. Bibasilar effusions and atelectasis. Electronically Signed   By: Jackquline Boxer M.D.   On: 03/18/2024 15:51   US  Venous Img Lower Unilateral Right Result Date: 03/18/2024 CLINICAL DATA:  Right lower extremity pain from the right hip to the right ankle. EXAM: RIGHT LOWER EXTREMITY VENOUS DOPPLER ULTRASOUND TECHNIQUE: Gray-scale sonography with graded compression, as well as color Doppler and duplex ultrasound were performed to evaluate the lower extremity deep venous systems from the level of the common femoral vein and including the common femoral, femoral, profunda femoral, popliteal and calf veins including the posterior tibial, peroneal and gastrocnemius veins when visible. The superficial great saphenous vein was also interrogated. Spectral Doppler was utilized to evaluate flow at rest and with distal augmentation maneuvers in the common femoral, femoral and popliteal veins. COMPARISON:  None Available. FINDINGS: Contralateral Common Femoral Vein: Respiratory phasicity is normal and symmetric with the symptomatic side. No evidence of thrombus. Normal compressibility. Common Femoral Vein: No evidence of thrombus. Normal compressibility, respiratory phasicity and response to augmentation. Saphenofemoral Junction: No evidence of thrombus. Normal compressibility and flow on color Doppler imaging. Profunda  Femoral Vein: No evidence of thrombus. Normal compressibility and flow on color Doppler imaging. Femoral Vein: No evidence of thrombus. Normal compressibility, respiratory phasicity and response to augmentation. Popliteal Vein: No evidence of thrombus. Normal compressibility, respiratory phasicity and response to augmentation. Calf Veins: No evidence of thrombus. Normal compressibility and flow on color Doppler imaging. Superficial Great Saphenous Vein: No evidence of thrombus. Normal compressibility. Venous Reflux:  None. Other Findings:  None. IMPRESSION: No evidence of deep venous thrombosis within the RIGHT lower extremity. Electronically Signed   By: Suzen Dials M.D.   On: 03/18/2024 14:42   CT Angio Chest PE W and/or Wo Contrast Result Date: 03/18/2024 EXAM: CTA CHEST 03/18/2024 12:56:00 PM TECHNIQUE: CTA of the chest was performed without and with the administration of 75 mL of intravenous contrast (iohexol  (OMNIPAQUE ) 350 MG/ML injection). Multiplanar reformatted images are provided for review. MIP images are provided for review. Automated exposure control, iterative reconstruction, and/or weight based adjustment of the mA/kV was utilized to reduce the radiation dose to as low as reasonably achievable. COMPARISON: 10/26/2023 CLINICAL HISTORY: Pulmonary embolism (PE) suspected, low to intermediate prob, positive D-dimer. PE suspected; Positive D-dimer; Omnipaque  350; 75 cc; bib [facility] from [facility] for RUQ pain x2 days that has progressively gotten worse. Hx of gallstones. C/o of nausea also. FINDINGS: PULMONARY ARTERIES: Pulmonary arteries are adequately opacified for evaluation. No acute pulmonary embolus. Main pulmonary artery is normal in caliber. MEDIASTINUM: The heart and pericardium demonstrate multivessel coronary artery calcifications. There is aortic atherosclerosis. No acute abnormality of the thoracic aorta. LYMPH NODES: Enlarged mediastinal and bilateral hilar lymph nodes are  identified. For example, right hilar node measures 1.7 cm (image 127/5), previously 1.5 cm. Left paratracheal lymph node measures 1.7 cm, previously 1 cm. Subcarinal node measures 1.6 cm (image 134/5), previously 1.4 cm. LUNGS AND PLEURA: Small bilateral pleural effusions, left greater than right. No pneumothorax identified. Diffuse interlobular septal thickening with ground glass attenuation noted throughout both lungs compatible with pulmonary edema. Bilateral lower lobe airspace consolidation concerning for pneumonia or aspiration. Persistent non-solid nodule within the left upper lobe containing ground glass and cystic components is stable measuring 1.3 cm (image 58/6). UPPER ABDOMEN: Large calcified stone within the gallbladder neck measures 3.7 cm. SOFT TISSUES AND BONES: No acute bone or soft tissue abnormality. No acute or suspicious osseous findings. IMPRESSION:  1. No evidence of pulmonary embolism. 2. Bilateral pleural effusions and moderate diffuse pulmonary edema. 3. Bilateral lower lobe airspace consolidation concerning for pneumonia or aspiration. 4. Enlarged mediastinal and bilateral hilar lymph nodes, including right hilar (1.7 cm), left paratracheal (1.7 cm), and subcarinal (1.6 cm) nodes. Likely reactive in the setting of chf and pneumonia. 5. Large calcified gallstone within the gallbladder neck measuring 3.7 cm. 6. Aortic atherosclerosis and multivessel coronary artery calcifications. Electronically signed by: Waddell Calk MD 03/18/2024 01:35 PM EDT RP Workstation: [id number]   US  ABDOMEN LIMITED RUQ (LIVER/GB) Result Date: 03/18/2024 EXAM: Right Upper Quadrant Abdominal Ultrasound 03/18/2024 12:29:49 PM TECHNIQUE: Real-time ultrasonography of the right upper quadrant of the abdomen was performed. COMPARISON: CT of the abdomen and pelvis from earlier today. CLINICAL HISTORY: RUQ abdominal pain. FINDINGS: LIVER: Increased parenchymal echogenicity of the liver. The portal vein is patent with  normal flow towards the liver. No intrahepatic biliary ductal dilatation. No evidence of mass. BILIARY SYSTEM: The gallbladder wall measures 3.5 mm in thickness. There is a large shadowing stone within the gallbladder neck measuring 3.4 cm. No pericholecystic fluid or sonographic Murphy's sign. The common bile duct is increasing caliber measuring 9 mm. No stones identified within the visualized proximal portion of the CBD. RIGHT KIDNEY: The right kidney is grossly unremarkable in appearances without evidence of hydronephrosis, echogenic calculi or worrisome mass lesions. PANCREAS: The pancreas is not well visualized. OTHER: Small right pleural effusion is incidentally noted. No right upper quadrant ascites. IMPRESSION: 1. Large shadowing gallstone within the gallbladder neck measuring 3.4 cm. The gallbladder wall is mildly thickened, measuring 3.5 mm. No pericholecystic fluid or sonographic murphy sign. 2. Mildly dilated common bile duct measuring 9 mm without visible choledocholithiasis in the visualized proximal segment; correlate with liver function tests and consider further evaluation with MRCP if clinically indicated. 3. Increased hepatic parenchymal echogenicity, compatible with hepatic steatosis. 4. Small right pleural effusion incidentally noted. Electronically signed by: Waddell Calk MD 03/18/2024 12:51 PM EDT RP Workstation: [id number]   Scheduled Meds:  furosemide   30 mg Intravenous TID   heparin  5,000 Units Subcutaneous Q8H   insulin  aspart  0-9 Units Subcutaneous TID WC   nicotine   21 mg Transdermal Daily   pantoprazole  40 mg Oral QPM   Continuous Infusions:  ceFEPime (MAXIPIME) IV 2 g (03/20/24 0906)   metronidazole  500 mg (03/20/24 0254)     LOS: 2 days   Time spent: 57 mins  Kanishk Stroebel Vicci, MD How to contact the [facility] Attending or Consulting provider 7A - 7P or covering provider during after hours 7P -7A, for this patient?  Check the care team in [facility] and look for a)  attending/consulting [facility] provider listed and b) the [facility] team listed Log into [email or web address] to find provider on call.  Locate the [facility] provider you are looking for under [facility] and page to a number that you can be directly reached. If you still have difficulty reaching the provider, please page the [facility] (Director on Call) for the Hospitalists listed on amion for assistance.  03/20/2024, 11:35 AM

## 2024-03-20 NOTE — Plan of Care (Signed)

## 2024-03-20 NOTE — Plan of Care (Signed)
  Problem: Education: Goal: Knowledge of General Education information will improve Description: Including pain rating scale, medication(s)/side effects and non-pharmacologic comfort measures Outcome: Progressing   Problem: Health Behavior/Discharge Planning: Goal: Ability to manage health-related needs will improve Outcome: Progressing   Problem: Clinical Measurements: Goal: Will remain free from infection Outcome: Progressing Goal: Respiratory complications will improve Outcome: Progressing   Problem: Activity: Goal: Risk for activity intolerance will decrease Outcome: Progressing   Problem: Coping: Goal: Level of anxiety will decrease Outcome: Progressing   Problem: Elimination: Goal: Will not experience complications related to urinary retention Outcome: Progressing   Problem: Pain Managment: Goal: General experience of comfort will improve and/or be controlled Outcome: Progressing   Problem: Safety: Goal: Ability to remain free from injury will improve Outcome: Progressing   Problem: Skin Integrity: Goal: Risk for impaired skin integrity will decrease Outcome: Progressing   Problem: Health Behavior/Discharge Planning: Goal: Ability to manage health-related needs will improve Outcome: Progressing   Problem: Metabolic: Goal: Ability to maintain appropriate glucose levels will improve Outcome: Progressing   Problem: Skin Integrity: Goal: Risk for impaired skin integrity will decrease Outcome: Progressing   Problem: Tissue Perfusion: Goal: Adequacy of tissue perfusion will improve Outcome: Progressing

## 2024-03-21 DIAGNOSIS — A0472 Enterocolitis due to Clostridium difficile, not specified as recurrent: Secondary | ICD-10-CM | POA: Diagnosis not present

## 2024-03-21 DIAGNOSIS — J9601 Acute respiratory failure with hypoxia: Secondary | ICD-10-CM | POA: Diagnosis not present

## 2024-03-21 DIAGNOSIS — I5031 Acute diastolic (congestive) heart failure: Secondary | ICD-10-CM

## 2024-03-21 DIAGNOSIS — K8020 Calculus of gallbladder without cholecystitis without obstruction: Secondary | ICD-10-CM | POA: Diagnosis not present

## 2024-03-21 LAB — COMPREHENSIVE METABOLIC PANEL WITH GFR
ALT: 77 U/L — ABNORMAL HIGH (ref 0–44)
AST: 30 U/L (ref 15–41)
Albumin: 3.7 g/dL (ref 3.5–5.0)
Alkaline Phosphatase: 179 U/L — ABNORMAL HIGH (ref 38–126)
Anion gap: 11 (ref 5–15)
BUN: 8 mg/dL (ref 6–20)
CO2: 26 mmol/L (ref 22–32)
Calcium: 9.4 mg/dL (ref 8.9–10.3)
Chloride: 97 mmol/L — ABNORMAL LOW (ref 98–111)
Creatinine, Ser: 0.47 mg/dL (ref 0.44–1.00)
GFR, Estimated: >60 mL/min (ref >60)
Glucose, Bld: 275 mg/dL — ABNORMAL HIGH (ref 70–99)
Potassium: 4.1 mmol/L (ref 3.5–5.1)
Sodium: 134 mmol/L — ABNORMAL LOW (ref 135–145)
Total Bilirubin: 0.4 mg/dL (ref 0.0–1.2)
Total Protein: 6.4 g/dL — ABNORMAL LOW (ref 6.5–8.1)

## 2024-03-21 LAB — CBC
HCT: 33.1 % — ABNORMAL LOW (ref 36.0–46.0)
Hemoglobin: 11 g/dL — ABNORMAL LOW (ref 12.0–15.0)
MCH: 30.6 pg (ref 26.0–34.0)
MCHC: 33.2 g/dL (ref 30.0–36.0)
MCV: 92.2 fL (ref 80.0–100.0)
Platelets: 313 K/uL (ref 150–400)
RBC: 3.59 MIL/uL — ABNORMAL LOW (ref 3.87–5.11)
RDW: 11.9 % (ref 11.5–15.5)
WBC: 8.7 K/uL (ref 4.0–10.5)
nRBC: 0 % (ref 0.0–0.2)

## 2024-03-21 LAB — GLUCOSE, CAPILLARY
Glucose-Capillary: 271 mg/dL — ABNORMAL HIGH (ref 70–99)
Glucose-Capillary: 250 mg/dL — ABNORMAL HIGH (ref 70–99)
Glucose-Capillary: 224 mg/dL — ABNORMAL HIGH (ref 70–99)
Glucose-Capillary: 298 mg/dL — ABNORMAL HIGH (ref 70–99)
Glucose-Capillary: 358 mg/dL — ABNORMAL HIGH (ref 70–99)

## 2024-03-21 LAB — CLOSTRIDIUM DIFFICILE BY PCR, REFLEXED
Hypervirulent Strain: NEGATIVE
Toxigenic C. Difficile by PCR: POSITIVE — AB

## 2024-03-21 MED ORDER — FUROSEMIDE 10 MG/ML IJ SOLN
60.0000 mg | Freq: Two times a day (BID) | INTRAMUSCULAR | Status: DC
  Administered 2024-03-21 – 2024-03-22 (×2): 60 mg via INTRAVENOUS
  Filled 2024-03-21 (×2): qty 6

## 2024-03-21 MED ORDER — INSULIN GLARGINE 100 UNIT/ML ~~LOC~~ SOLN
7.0000 [IU] | Freq: Every day | SUBCUTANEOUS | Status: DC
  Administered 2024-03-21: 7 [IU] via SUBCUTANEOUS
  Filled 2024-03-21 (×3): qty 0.07

## 2024-03-21 MED ORDER — INSULIN ASPART 100 UNIT/ML IJ SOLN
0.0000 [IU] | Freq: Every day | INTRAMUSCULAR | Status: DC
  Administered 2024-03-21: 5 [IU] via SUBCUTANEOUS

## 2024-03-21 MED ORDER — INSULIN ASPART 100 UNIT/ML IJ SOLN
0.0000 [IU] | Freq: Three times a day (TID) | INTRAMUSCULAR | Status: DC
  Administered 2024-03-21: 3 [IU] via SUBCUTANEOUS
  Administered 2024-03-21: 5 [IU] via SUBCUTANEOUS
  Administered 2024-03-22: 3 [IU] via SUBCUTANEOUS

## 2024-03-21 MED ORDER — VANCOMYCIN HCL 125 MG PO CAPS
125.0000 mg | ORAL_CAPSULE | Freq: Four times a day (QID) | ORAL | Status: DC
  Administered 2024-03-21 – 2024-03-22 (×5): 125 mg via ORAL
  Filled 2024-03-21 (×5): qty 1

## 2024-03-21 NOTE — Progress Notes (Addendum)
 PROGRESS NOTE  Nancy [name] FMW:[id number] DOB: 06/07/1966 DOA: 03/18/2024 PCP: Earvin Johnston PARAS, FNP  Brief History:  58 year old female with history of alcohol use disorder, detoxed approximately 1 month ago and currently residing at [facility], recently discharged from [facility] family medicine service 1 month ago after being treated for pyelonephritis, past medical history significant for hepatic steatosis, substance abuse, currently on Suboxone, poorly controlled type 2 diabetes mellitus with hyperglycemia, osteoarthritis of the hips, hypoxia, history of DKA, depression and anxiety, chronic constipation who was brought in by [facility] from [facility] for 2 days of colicky right upper quadrant abdominal pain that is progressively worsened.  Patient reportedly has a history of cholelithiasis.  Patient has complained of nausea but no emesis.  Patient reports she had a bout of severe flank pain on the right side that radiated down into her back and right leg.  Patient denies chest pain and shortness of breath.  Patient denies dysuria.  Patient denies fever and chills.  On arrival to the ED she was noted to be hypoxic with a pulse ox of 84% on room air.  Her workup in the ED significant for hypoglycemia, blood sugar 33, CTA chest PE with findings of bilateral pleural effusions and moderate bilateral pulmonary edema, no PE found, bilateral lower lobe airspace disease suggesting pneumonia and/or aspiration.  Abdominal ultrasound with findings of large gallstone within the gallbladder neck measuring 3.4 cm, Mildly dilated common bile duct measuring 9 mm without visible choledocholithiasis in the visualized proximal segment, gallbladder enlargement, bladder wall thickening with concern for cystitis, hepatic steatosis.  She was started on IV antibiotics for pneumonia and admission requested for further management.    Assessment/Plan: Biliary Colic -- Patient presenting with symptoms of  biliary colic -- had elevated LFTs but normal bili -- Imaging findings of cholelithiasis and common bile duct dilatation -- MRCP--no choledocholithiasis;  cholelithiasis without cholecystitis -- HIDA scan-patent CBD and early filling of GB -- continue IV antibiotics, IV metronidazole  to regimen -- diet advanced to soft diet>>tolerating -- surgery consulted, recommend outpatient follow up    Elevated LFTs--trending down [improving] -- choledocholithiasis ruled out, she may have passed a gallstone  -- Patient does have a history of hepatic steatosis -- RUQ US  GB neck gallstone without pericholecystic fluid -- Follow daily LFTs--trending down -- Lipase 29  Cdiff colitis -Cdiff antigen and PCR positive -start po vanc   Uncontrolled type 2 diabetes mellitus with hyperglycemia Hypoglycemia -- Add glargine 7 units, novolog  3 units with meals -- novolog  sliding scale -- 01/24/24  A1c>14%    Bilateral lobar pneumonia -- Provide coverage for HCAP and aspiration given image findings -- Continue IV antibiotics as ordered -- Continue supportive measures -- Obtain blood cultures x 2--neg to date   Acute HFpEF -- evidence of pulmonary edema and bilateral pleural effusions on imaging -- increased lasix  to 60 mg bid -- NEG 6.6L -- 02/11/24 Echo EF 60-65%, G1DD, normal RVF    Chronic pain Opioid dependence -- pt reports she is on belbuca -- resume when meds reconciled -- oxycodone  5 mg, #26, last refill 03/12/24 -- use nonopioid modalities for pain mgmt as much as possible -- toradol  ordered prn pain   Tobacco use -- nicotine  patch 21 mg daily ordered    GERD  -- pantoprazole ordered for GI protection        Family Communication:   Family at bedside  Consultants:  general surgery  Code Status:  FULL DVT Prophylaxis:  [place] Heparin    Procedures: As Listed in Progress Note Above  Antibiotics: Cefepime 9/28>> Metronidazole  9/28>> Vanco po 10/1>>   Subjective: Pt had  one loose stool in last 24 hours.  No hematochezia, no melena.  Abd pain is improved.  Denies f/c, cp, sob, cough  Objective: Vitals:   03/20/24 1955 03/21/24 0250 03/21/24 0500 03/21/24 0813  BP: 112/70 114/73  109/68  Pulse: 86 83  86  Resp: 20 16  18   Temp: 98 F (36.7 C) 98 F (36.7 C)  98.1 F (36.7 C)  TempSrc: Oral Oral  Oral  SpO2: 96% 93%  94%  Weight:   69.5 kg   Height:        Intake/Output Summary (Last 24 hours) at 03/21/2024 1115 Last data filed at 03/21/2024 0914 Gross per 24 hour  Intake 960 ml  Output 5600 ml  Net -4640 ml   Weight change: -5.8 kg Exam:  General:  Pt is alert, follows commands appropriately, not in acute distress HEENT: No icterus, No thrush, No neck mass, [place]/AT Cardiovascular: RRR, S1/S2, no rubs, no gallops Respiratory: bibasilar crackles. No wheeze Abdomen: Soft/+BS, non tender, non distended, no guarding Extremities: No edema, No lymphangitis, No petechiae, No rashes, no synovitis   Data Reviewed: I have personally reviewed following labs and imaging studies Basic Metabolic Panel: Recent Labs  Lab 03/18/24 1114 03/19/24 0507 03/20/24 0447 03/21/24 0435  NA 136 135 134* 134*  K 4.0 3.9 3.7 4.1  CL 98 100 97* 97*  CO2 27 26 28 26   GLUCOSE 52* 88 165* 275*  BUN 11 11 9 8   CREATININE 0.34* 0.42* 0.46 0.47  CALCIUM  9.2 8.4* 8.7* 9.4   Liver Function Tests: Recent Labs  Lab 03/18/24 1114 03/19/24 0507 03/20/24 0447 03/21/24 0435  AST 86* 58* 44* 30  ALT 143* 98* 88* 77*  ALKPHOS 197* 160* 165* 179*  BILITOT 0.4 0.4 0.5 0.4  PROT 7.0 5.6* 6.1* 6.4*  ALBUMIN 3.4* 2.8* 3.1* 3.7   Recent Labs  Lab 03/18/24 1114  LIPASE 29   No results for input(s): AMMONIA in the last 168 hours. Coagulation Profile: No results for input(s): INR, PROTIME in the last 168 hours. CBC: Recent Labs  Lab 03/18/24 1114 03/18/24 1620 03/19/24 0507 03/20/24 0447 03/21/24 0435  WBC 9.3 8.6 6.5 8.7 8.7  NEUTROABS 6.1  --   --    --   --   HGB 11.5* 10.8* 10.1* 10.6* 11.0*  HCT 35.8* 33.2* 31.5* 32.5* 33.1*  MCV 94.5 94.6 94.6 93.4 92.2  PLT 307 310 276 303 313   Cardiac Enzymes: No results for input(s): CKTOTAL, CKMB, CKMBINDEX, TROPONINI in the last 168 hours. BNP: Invalid input(s): POCBNP CBG: Recent Labs  Lab 03/20/24 1537 03/20/24 1725 03/20/24 2102 03/21/24 0246 03/21/24 0724  GLUCAP 266* 270* 228* 271* 250*   HbA1C: No results for input(s): HGBA1C in the last 72 hours. Urine analysis:    Component Value Date/Time   COLORURINE YELLOW 03/18/2024 1122   APPEARANCEUR CLOUDY (A) 03/18/2024 1122   LABSPEC 1.011 03/18/2024 1122   PHURINE 5.0 03/18/2024 1122   GLUCOSEU NEGATIVE 03/18/2024 1122   HGBUR NEGATIVE 03/18/2024 1122   BILIRUBINUR NEGATIVE 03/18/2024 1122   KETONESUR NEGATIVE 03/18/2024 1122   PROTEINUR NEGATIVE 03/18/2024 1122   UROBILINOGEN 0.2 02/11/2012 0858   NITRITE NEGATIVE 03/18/2024 1122   LEUKOCYTESUR LARGE (A) 03/18/2024 1122   Sepsis Labs: @LABRCNTIP (procalcitonin:4,lacticidven:4) ) Recent Results (from the  past 240 hours)  Culture, blood (Routine X 2) w Reflex to ID Panel     Status: None (Preliminary result)   Collection Time: 03/18/24  4:20 PM   Specimen: Left Antecubital; Blood  Result Value Ref Range Status   Specimen Description   Final    LEFT ANTECUBITAL BOTTLES DRAWN AEROBIC AND ANAEROBIC   Special Requests Blood Culture adequate volume  Final   Culture   Final    NO GROWTH 3 DAYS Performed at [facility], [address], [place], [place] [address]    Report Status PENDING  Incomplete  Culture, blood (Routine X 2) w Reflex to ID Panel     Status: None (Preliminary result)   Collection Time: 03/18/24  4:20 PM   Specimen: BLOOD RIGHT HAND  Result Value Ref Range Status   Specimen Description   Final    BLOOD RIGHT HAND BOTTLES DRAWN AEROBIC AND ANAEROBIC   Special Requests Blood Culture adequate volume  Final   Culture   Final    NO GROWTH 3  DAYS Performed at [facility], [address], [place], [place] [address]    Report Status PENDING  Incomplete  MRSA Next Gen by PCR, Nasal     Status: None   Collection Time: 03/19/24  2:40 PM   Specimen: Nasal Mucosa; Nasal Swab  Result Value Ref Range Status   MRSA by PCR Next Gen NOT DETECTED NOT DETECTED Final    Comment: (NOTE) The GeneXpert MRSA Assay (FDA approved for NASAL specimens only), is one component of a comprehensive MRSA colonization surveillance program. It is not intended to diagnose MRSA infection nor to guide or monitor treatment for MRSA infections. Test performance is not FDA approved in patients less than 94 years old. Performed at [facility], [address] [place], [place] [address]   C Difficile Quick Screen w PCR reflex     Status: Abnormal   Collection Time: 03/20/24  8:28 PM   Specimen: STOOL  Result Value Ref Range Status   C Diff antigen POSITIVE (A) NEGATIVE Final   C Diff toxin NEGATIVE NEGATIVE Final   C Diff interpretation Results are indeterminate. See PCR results.  Final    Comment: Performed at [facility], [address] [place], [place] [address]  C. Diff by PCR, Reflexed     Status: Abnormal   Collection Time: 03/20/24  8:28 PM  Result Value Ref Range Status   Toxigenic C. Difficile by PCR POSITIVE (A) NEGATIVE Final    Comment: Positive for toxigenic C. difficile with little to no toxin production. Only treat if clinical presentation suggests symptomatic illness.   Hypervirulent Strain PRESUMPTIVE NEGATIVE PRESUMPTIVE NEGATIVE Final    Comment: Performed at [facility], [address] , [place] [address]     Scheduled Meds:  furosemide   30 mg Intravenous BID   heparin  5,000 Units Subcutaneous Q8H   insulin  aspart  0-5 Units Subcutaneous QHS   insulin  aspart  0-9 Units Subcutaneous TID WC   insulin  aspart  3 Units Subcutaneous TID WC   insulin  glargine  7 Units Subcutaneous Daily   nicotine   21 mg Transdermal Daily    pantoprazole  40 mg Oral QPM   vancomycin  125 mg Oral QID   Continuous Infusions:  ceFEPime (MAXIPIME) IV 2 g (03/21/24 0818)   metronidazole  500 mg (03/21/24 0254)    Procedures/Studies: DG CHEST PORT 1 VIEW Result Date: 03/20/2024 CLINICAL DATA:  Shortness of breath. History of  congestive heart failure. EXAM: PORTABLE CHEST 1 VIEW COMPARISON:  Yesterday FINDINGS: Midline trachea. Mild cardiomegaly. Increased small left pleural effusion. Probable trace right pleural fluid. No pneumothorax. Improved interstitial edema with mild pulmonary venous congestion remaining. Increased bibasilar airspace disease. IMPRESSION: 1. Improved interstitial edema with mild pulmonary venous congestion remaining. 2. Increased small left and trace right pleural effusions with adjacent airspace disease, likely atelectasis. Cannot exclude developing left base airspace pneumonia or aspiration. Electronically Signed   By: Rockey Kilts M.D.   On: 03/20/2024 16:11   NM Hepato W/EF Result Date: 03/20/2024 CLINICAL DATA:  Cholelithiasis. RIGHT upper quadrant pain. No leukocytosis. Normal bilirubin is EXAM: NUCLEAR MEDICINE HEPATOBILIARY IMAGING TECHNIQUE: Sequential images of the abdomen were obtained out to 60 minutes following intravenous administration of radiopharmaceutical. Patient extremely claustrophobic.  Only 10 minutes of imaging. RADIOPHARMACEUTICALS:  5.4 mCi Tc-8m  Choletec IV COMPARISON:  CT and MRI 03/18/24 FINDINGS: Prompt clearance radiotracer from blood pool and homogeneous uptake in liver. Counts are evident in the common bile duct and small bowel by 10 minutes. The gallbladder appears to begin to fill. Exam was terminated after only 10 minutes imaging due to claustrophobia. IMPRESSION: 1. Limited exam due to short duration imaging as above. 2. Patent common bile duct. 3. Suggestion of early filling of the gallbladder. Electronically Signed   By: Jackquline Boxer M.D.   On: 03/20/2024 09:11   DG Chest Port  1 View Result Date: 03/19/2024 CLINICAL DATA:  58 year old female with pulmonary edema, heart failure. EXAM: PORTABLE CHEST 1 VIEW COMPARISON:  Chest CTA yesterday. FINDINGS: Portable AP supine views at 0537 hours. Bilateral pulmonary edema, with small layering pleural effusions better demonstrated on CTA. Compared to portable chest yesterday there is decreased fluid in the right minor fissure. Pulmonary vascular congestion has not improved. Larger lung volumes. Stable cardiac size and mediastinal contours. No pneumothorax or consolidation. Visualized tracheal air column is within normal limits. Stable visualized osseous structures. Negative visible bowel gas. IMPRESSION: Ongoing pulmonary edema with larger lung volumes and mildly decreased appearance of pleural fluid. Electronically Signed   By: VEAR Hurst M.D.   On: 03/19/2024 06:14   MR ABDOMEN MRCP W WO CONTAST Result Date: 03/18/2024 CLINICAL DATA:  Cholelithiasis.  RIGHT upper quadrant pain. EXAM: MRI ABDOMEN WITHOUT AND WITH CONTRAST (INCLUDING MRCP) TECHNIQUE: Multiplanar multisequence MR imaging of the abdomen was performed both before and after the administration of intravenous contrast. Heavily T2-weighted images of the biliary and pancreatic ducts were obtained, and three-dimensional MRCP images were rendered by post processing. CONTRAST:  7mL GADAVIST GADOBUTROL 1 MMOL/ML IV SOLN COMPARISON:  Ultrasound same day, CT chest same day FINDINGS: Lower chest:  Bibasilar effusions and atelectasis. Hepatobiliary: No intrahepatic or extrahepatic biliary duct dilatation. No filling defect within the common bile duct. The common hepatic duct measures 8 mm. The common bile duct measures 5 mm. Large ovoid stone within the proximal gallbladder measures 3.6 cm. No pericholecystic fluid. No gallbladder wall thickening. Pancreas: Normal pancreatic parenchymal intensity. No ductal dilatation or inflammation. Spleen: Normal spleen. Adrenals/urinary tract: Adrenal glands  and kidneys are normal. Stomach/Bowel: Stomach and limited of the small bowel is unremarkable Vascular/Lymphatic: Abdominal aortic normal caliber. No retroperitoneal periportal lymphadenopathy. Musculoskeletal: No aggressive osseous lesion IMPRESSION: 1. Large gallstone without evidence acute cholecystitis. 2. No choledocholithiasis. 3. No biliary duct dilatation. 4. Normal pancreas. 5. Bibasilar effusions and atelectasis. Electronically Signed   By: Jackquline Boxer M.D.   On: 03/18/2024 15:51   MR 3D Recon At Scanner Result  Date: 03/18/2024 CLINICAL DATA:  Cholelithiasis.  RIGHT upper quadrant pain. EXAM: MRI ABDOMEN WITHOUT AND WITH CONTRAST (INCLUDING MRCP) TECHNIQUE: Multiplanar multisequence MR imaging of the abdomen was performed both before and after the administration of intravenous contrast. Heavily T2-weighted images of the biliary and pancreatic ducts were obtained, and three-dimensional MRCP images were rendered by post processing. CONTRAST:  7mL GADAVIST GADOBUTROL 1 MMOL/ML IV SOLN COMPARISON:  Ultrasound same day, CT chest same day FINDINGS: Lower chest:  Bibasilar effusions and atelectasis. Hepatobiliary: No intrahepatic or extrahepatic biliary duct dilatation. No filling defect within the common bile duct. The common hepatic duct measures 8 mm. The common bile duct measures 5 mm. Large ovoid stone within the proximal gallbladder measures 3.6 cm. No pericholecystic fluid. No gallbladder wall thickening. Pancreas: Normal pancreatic parenchymal intensity. No ductal dilatation or inflammation. Spleen: Normal spleen. Adrenals/urinary tract: Adrenal glands and kidneys are normal. Stomach/Bowel: Stomach and limited of the small bowel is unremarkable Vascular/Lymphatic: Abdominal aortic normal caliber. No retroperitoneal periportal lymphadenopathy. Musculoskeletal: No aggressive osseous lesion IMPRESSION: 1. Large gallstone without evidence acute cholecystitis. 2. No choledocholithiasis. 3. No biliary  duct dilatation. 4. Normal pancreas. 5. Bibasilar effusions and atelectasis. Electronically Signed   By: Jackquline Boxer M.D.   On: 03/18/2024 15:51   US  Venous Img Lower Unilateral Right Result Date: 03/18/2024 CLINICAL DATA:  Right lower extremity pain from the right hip to the right ankle. EXAM: RIGHT LOWER EXTREMITY VENOUS DOPPLER ULTRASOUND TECHNIQUE: Gray-scale sonography with graded compression, as well as color Doppler and duplex ultrasound were performed to evaluate the lower extremity deep venous systems from the level of the common femoral vein and including the common femoral, femoral, profunda femoral, popliteal and calf veins including the posterior tibial, peroneal and gastrocnemius veins when visible. The superficial great saphenous vein was also interrogated. Spectral Doppler was utilized to evaluate flow at rest and with distal augmentation maneuvers in the common femoral, femoral and popliteal veins. COMPARISON:  None Available. FINDINGS: Contralateral Common Femoral Vein: Respiratory phasicity is normal and symmetric with the symptomatic side. No evidence of thrombus. Normal compressibility. Common Femoral Vein: No evidence of thrombus. Normal compressibility, respiratory phasicity and response to augmentation. Saphenofemoral Junction: No evidence of thrombus. Normal compressibility and flow on color Doppler imaging. Profunda Femoral Vein: No evidence of thrombus. Normal compressibility and flow on color Doppler imaging. Femoral Vein: No evidence of thrombus. Normal compressibility, respiratory phasicity and response to augmentation. Popliteal Vein: No evidence of thrombus. Normal compressibility, respiratory phasicity and response to augmentation. Calf Veins: No evidence of thrombus. Normal compressibility and flow on color Doppler imaging. Superficial Great Saphenous Vein: No evidence of thrombus. Normal compressibility. Venous Reflux:  None. Other Findings:  None. IMPRESSION: No evidence  of deep venous thrombosis within the RIGHT lower extremity. Electronically Signed   By: Suzen Dials M.D.   On: 03/18/2024 14:42   CT Angio Chest PE W and/or Wo Contrast Result Date: 03/18/2024 EXAM: CTA CHEST 03/18/2024 12:56:00 PM TECHNIQUE: CTA of the chest was performed without and with the administration of 75 mL of intravenous contrast (iohexol  (OMNIPAQUE ) 350 MG/ML injection). Multiplanar reformatted images are provided for review. MIP images are provided for review. Automated exposure control, iterative reconstruction, and/or weight based adjustment of the mA/kV was utilized to reduce the radiation dose to as low as reasonably achievable. COMPARISON: 10/26/2023 CLINICAL HISTORY: Pulmonary embolism (PE) suspected, low to intermediate prob, positive D-dimer. PE suspected; Positive D-dimer; Omnipaque  350; 75 cc; bib [facility]ms from [facility] for RUQ pain x2  days that has progressively gotten worse. Hx of gallstones. C/o of nausea also. FINDINGS: PULMONARY ARTERIES: Pulmonary arteries are adequately opacified for evaluation. No acute pulmonary embolus. Main pulmonary artery is normal in caliber. MEDIASTINUM: The heart and pericardium demonstrate multivessel coronary artery calcifications. There is aortic atherosclerosis. No acute abnormality of the thoracic aorta. LYMPH NODES: Enlarged mediastinal and bilateral hilar lymph nodes are identified. For example, right hilar node measures 1.7 cm (image 127/5), previously 1.5 cm. Left paratracheal lymph node measures 1.7 cm, previously 1 cm. Subcarinal node measures 1.6 cm (image 134/5), previously 1.4 cm. LUNGS AND PLEURA: Small bilateral pleural effusions, left greater than right. No pneumothorax identified. Diffuse interlobular septal thickening with ground glass attenuation noted throughout both lungs compatible with pulmonary edema. Bilateral lower lobe airspace consolidation concerning for pneumonia or aspiration. Persistent non-solid nodule within the  left upper lobe containing ground glass and cystic components is stable measuring 1.3 cm (image 58/6). UPPER ABDOMEN: Large calcified stone within the gallbladder neck measures 3.7 cm. SOFT TISSUES AND BONES: No acute bone or soft tissue abnormality. No acute or suspicious osseous findings. IMPRESSION: 1. No evidence of pulmonary embolism. 2. Bilateral pleural effusions and moderate diffuse pulmonary edema. 3. Bilateral lower lobe airspace consolidation concerning for pneumonia or aspiration. 4. Enlarged mediastinal and bilateral hilar lymph nodes, including right hilar (1.7 cm), left paratracheal (1.7 cm), and subcarinal (1.6 cm) nodes. Likely reactive in the setting of chf and pneumonia. 5. Large calcified gallstone within the gallbladder neck measuring 3.7 cm. 6. Aortic atherosclerosis and multivessel coronary artery calcifications. Electronically signed by: Waddell Calk MD 03/18/2024 01:35 PM EDT RP Workstation: [id number]   US  ABDOMEN LIMITED RUQ (LIVER/GB) Result Date: 03/18/2024 EXAM: Right Upper Quadrant Abdominal Ultrasound 03/18/2024 12:29:49 PM TECHNIQUE: Real-time ultrasonography of the right upper quadrant of the abdomen was performed. COMPARISON: CT of the abdomen and pelvis from earlier today. CLINICAL HISTORY: RUQ abdominal pain. FINDINGS: LIVER: Increased parenchymal echogenicity of the liver. The portal vein is patent with normal flow towards the liver. No intrahepatic biliary ductal dilatation. No evidence of mass. BILIARY SYSTEM: The gallbladder wall measures 3.5 mm in thickness. There is a large shadowing stone within the gallbladder neck measuring 3.4 cm. No pericholecystic fluid or sonographic Murphy's sign. The common bile duct is increasing caliber measuring 9 mm. No stones identified within the visualized proximal portion of the CBD. RIGHT KIDNEY: The right kidney is grossly unremarkable in appearances without evidence of hydronephrosis, echogenic calculi or worrisome mass lesions.  PANCREAS: The pancreas is not well visualized. OTHER: Small right pleural effusion is incidentally noted. No right upper quadrant ascites. IMPRESSION: 1. Large shadowing gallstone within the gallbladder neck measuring 3.4 cm. The gallbladder wall is mildly thickened, measuring 3.5 mm. No pericholecystic fluid or sonographic murphy sign. 2. Mildly dilated common bile duct measuring 9 mm without visible choledocholithiasis in the visualized proximal segment; correlate with liver function tests and consider further evaluation with MRCP if clinically indicated. 3. Increased hepatic parenchymal echogenicity, compatible with hepatic steatosis. 4. Small right pleural effusion incidentally noted. Electronically signed by: Waddell Calk MD 03/18/2024 12:51 PM EDT RP Workstation: [id number]   CT Renal Stone Study Result Date: 03/18/2024 CLINICAL DATA:  Abdominal/flank pain, stone suspected EXAM: CT ABDOMEN AND PELVIS WITHOUT CONTRAST TECHNIQUE: Multidetector CT imaging of the abdomen and pelvis was performed following the standard protocol without IV contrast. RADIATION DOSE REDUCTION: This exam was performed according to the departmental dose-optimization program which includes automated exposure control, adjustment of the mA and/or kV  according to patient size and/or use of iterative reconstruction technique. COMPARISON:  January 24, 2024 FINDINGS: Evaluation is limited by lack of IV contrast. Lower chest: Small bilateral pleural effusions with bibasilar heterogeneous airspace opacities and bronchial wall thickening. Mild interlobular septal thickening. Hepatobiliary: Large cholelithiasis. The gallbladder is distended. No definitive pericholecystic fluid. There is a single with area of fat stranding along the inferior margin of the gallbladder, nonspecific (series 5, image 35). Fluid. No extrahepatic biliary ductal dilation. Periportal edema. Pancreas: No new peripancreatic fat stranding. Relatively atrophic appearance of  the pancreas. Spleen: Unremarkable. Adrenals/Urinary Tract: Adrenal glands are unremarkable. No hydronephrosis. No obstructing nephrolithiasis. Circumferential wall thickening with faint adjacent fat stranding of the bladder. Stomach/Bowel: No evidence of bowel obstruction. Appendix is unremarkable. Stomach is decompressed, limiting evaluation. Favor small hiatal hernia. Vascular/Lymphatic: Atherosclerotic calcifications of the nonaneurysmal abdominal aorta. Prominent retroperitoneal lymph nodes with representative LEFT periaortic lymph node measuring 9 mm in the short axis, similar compared to prior (series 2, image 28). Perigastric lymph node measuring 12 mm in the short axis, previously 11 mm (series 2, image 15). Aortocaval node measuring 8 mm in the short axis, unchanged (series 2, image 29). Representative RIGHT inguinal lymph node measures 13 mm in the short axis, similar compared to prior (series 2, image 86). Reproductive: Status post hysterectomy. No adnexal masses. Other: Trace free fluid in the pelvis.  Anasarca. Musculoskeletal: Advanced degenerative changes of bilateral hips. Linear horizontal lucency traversing the RIGHT sacrum (series 5, image 85). IMPRESSION: 1. No hydronephrosis or obstructing nephrolithiasis. 2. Circumferential wall thickening of the bladder with faint adjacent fat stranding. Recommend correlation with urinalysis to exclude cystitis. 3. Large cholelithiasis with distended gallbladder. There is a single focus of fat stranding along the inferior margin of the gallbladder, nonspecific in a background of anasarca and favored to be incidental. If there is persistent clinical concern for acute cholecystitis, HIDA scan would be the modality of choice for further evaluation. 4. Small bilateral pleural effusions with bibasilar heterogeneous airspace opacities and bronchial wall thickening. Findings are favored to reflect pulmonary edema with superimposed atelectasis. 5. Linear horizontal  lucency traversing the RIGHT sacrum. This could reflect a nondisplaced fracture versus prominent nutrient foramen. Recommend correlation with point tenderness. 6. Prominent retroperitoneal and inguinal adenopathy, nonspecific and possibly reactive. Aortic Atherosclerosis (ICD10-I70.0). Electronically Signed   By: Corean Salter M.D.   On: 03/18/2024 12:07   DG Chest Port 1 View Result Date: 03/18/2024 EXAM: 1 VIEW(S) XRAY OF THE CHEST 03/18/2024 11:00:00 AM COMPARISON: 02/14/2024 CLINICAL HISTORY: hypoxia. Per chart; Pt bib [facility]ms from [facility] for RUQ pain x2 days that has progressively gotten worse. Hx of gallstones. C/o of nausea also. FINDINGS: LUNGS AND PLEURA: Small bilateral pleural effusions. Mild interstitial edema. Bibasilar atelectasis. No focal pulmonary opacity. No pneumothorax. HEART AND MEDIASTINUM: No acute abnormality of the cardiac and mediastinal silhouettes. BONES AND SOFT TISSUES: No acute osseous abnormality. IMPRESSION: 1. Small bilateral pleural effusions. 2. Mild interstitial edema. 3. Bibasilar atelectasis. Electronically signed by: Waddell Calk MD 03/18/2024 11:13 AM EDT RP Workstation: [id number]    Alm Schneider, DO  [facility]  If 7PM-7AM, please contact night-coverage [email or web address] Password [email or web address] 03/21/2024, 11:15 AM   LOS: 3 days

## 2024-03-21 NOTE — Progress Notes (Signed)
 Subjective: No right upper quadrant abdominal pain and nausea.  Patient has not had breakfast yet.  Objective: Vital signs in last 24 hours: Temp:  [98 F (36.7 C)-98.1 F (36.7 C)] 98.1 F (36.7 C) (10/01 0813) Pulse Rate:  [81-86] 86 (10/01 0813) Resp:  [16-20] 18 (10/01 0813) BP: (109-116)/(68-73) 109/68 (10/01 0813) SpO2:  [93 %-96 %] 94 % (10/01 0813) Weight:  [69.5 kg] 69.5 kg (10/01 0500) Last BM Date : 03/09/24  Intake/Output from previous day: 09/30 0701 - 10/01 0700 In: 960 [P.O.:960] Out: 5700 [Urine:5700] Intake/Output this shift: Total I/O In: -  Out: 1050 [Urine:1050]  General appearance: alert, cooperative, and no distress GI: soft, non-tender; bowel sounds normal; no masses,  no organomegaly  Lab Results:  Recent Labs    03/20/24 0447 03/21/24 0435  WBC 8.7 8.7  HGB 10.6* 11.0*  HCT 32.5* 33.1*  PLT 303 313   BMET Recent Labs    03/20/24 0447 03/21/24 0435  NA 134* 134*  K 3.7 4.1  CL 97* 97*  CO2 28 26  GLUCOSE 165* 275*  BUN 9 8  CREATININE 0.46 0.47  CALCIUM  8.7* 9.4   PT/INR No results for input(s): LABPROT, INR in the last 72 hours.  Studies/Results: DG CHEST PORT 1 VIEW Result Date: 03/20/2024 CLINICAL DATA:  Shortness of breath. History of congestive heart failure. EXAM: PORTABLE CHEST 1 VIEW COMPARISON:  Yesterday FINDINGS: Midline trachea. Mild cardiomegaly. Increased small left pleural effusion. Probable trace right pleural fluid. No pneumothorax. Improved interstitial edema with mild pulmonary venous congestion remaining. Increased bibasilar airspace disease. IMPRESSION: 1. Improved interstitial edema with mild pulmonary venous congestion remaining. 2. Increased small left and trace right pleural effusions with adjacent airspace disease, likely atelectasis. Cannot exclude developing left base airspace pneumonia or aspiration. Electronically Signed   By: Rockey Kilts M.D.   On: 03/20/2024 16:11   NM Hepato W/EF Result Date:  03/20/2024 CLINICAL DATA:  Cholelithiasis. RIGHT upper quadrant pain. No leukocytosis. Normal bilirubin is EXAM: NUCLEAR MEDICINE HEPATOBILIARY IMAGING TECHNIQUE: Sequential images of the abdomen were obtained out to 60 minutes following intravenous administration of radiopharmaceutical. Patient extremely claustrophobic.  Only 10 minutes of imaging. RADIOPHARMACEUTICALS:  5.4 mCi Tc-2m  Choletec IV COMPARISON:  CT and MRI 03/18/24 FINDINGS: Prompt clearance radiotracer from blood pool and homogeneous uptake in liver. Counts are evident in the common bile duct and small bowel by 10 minutes. The gallbladder appears to begin to fill. Exam was terminated after only 10 minutes imaging due to claustrophobia. IMPRESSION: 1. Limited exam due to short duration imaging as above. 2. Patent common bile duct. 3. Suggestion of early filling of the gallbladder. Electronically Signed   By: Jackquline Boxer M.D.   On: 03/20/2024 09:11    Anti-infectives: Anti-infectives (From admission, onward)    Start     Dose/Rate Route Frequency Ordered Stop   03/19/24 0600  vancomycin (VANCOCIN) IVPB 1000 mg/200 mL premix  Status:  Discontinued        1,000 mg 200 mL/hr over 60 Minutes Intravenous Every 12 hours 03/18/24 1544 03/20/24 1129   03/19/24 0000  ceFEPIme (MAXIPIME) 2 g in sodium chloride  0.9 % 100 mL IVPB        2 g 200 mL/hr over 30 Minutes Intravenous Every 8 hours 03/18/24 1543     03/18/24 1500  metroNIDAZOLE  (FLAGYL ) IVPB 500 mg        500 mg 100 mL/hr over 60 Minutes Intravenous Every 12 hours 03/18/24 1434  03/18/24 1430  vancomycin (VANCOREADY) IVPB 1500 mg/300 mL        1,500 mg 150 mL/hr over 120 Minutes Intravenous  Once 03/18/24 1416 03/18/24 1812   03/18/24 1415  vancomycin (VANCOCIN) IVPB 1000 mg/200 mL premix  Status:  Discontinued        1,000 mg 200 mL/hr over 60 Minutes Intravenous  Once 03/18/24 1413 03/18/24 1416   03/18/24 1415  ceFEPIme (MAXIPIME) 2 g in sodium chloride  0.9 % 100 mL  IVPB        2 g 200 mL/hr over 30 Minutes Intravenous  Once 03/18/24 1413 03/18/24 1641       Assessment/Plan: Impression: Cholelithiasis with transaminitis.  LFTs have almost normalized.  Patient tolerating regular diet well.  Stable from surgical standpoint to be discharged.  Would have to wait at least 2 weeks for her pulmonary status to stabilize.  Follow-up in my office as needed.  Discussed with Dr. Evonnie.  LOS: 3 days    Oneil Budge 03/21/2024

## 2024-03-21 NOTE — Plan of Care (Signed)
  Problem: Health Behavior/Discharge Planning: Goal: Ability to manage health-related needs will improve Outcome: Progressing   Problem: Clinical Measurements: Goal: Ability to maintain clinical measurements within normal limits will improve Outcome: Progressing Goal: Will remain free from infection Outcome: Progressing   Problem: Activity: Goal: Risk for activity intolerance will decrease Outcome: Progressing   Problem: Coping: Goal: Level of anxiety will decrease Outcome: Progressing   Problem: Pain Managment: Goal: General experience of comfort will improve and/or be controlled Outcome: Progressing   Problem: Skin Integrity: Goal: Risk for impaired skin integrity will decrease Outcome: Progressing

## 2024-03-21 NOTE — Progress Notes (Signed)
 Mobility Specialist Progress Note:    03/21/24 1410  Mobility  Activity Ambulated with assistance  Level of Assistance Standby assist, set-up cues, supervision of patient - no hands on  Assistive Device Front wheel walker  Distance Ambulated (ft) 110 ft  Range of Motion/Exercises Active;All extremities  Activity Response Tolerated well  Mobility Referral Yes  Mobility visit 1 Mobility  Mobility Specialist Start Time (ACUTE ONLY) 1410  Mobility Specialist Stop Time (ACUTE ONLY) 1430  Mobility Specialist Time Calculation (min) (ACUTE ONLY) 20 min   Pt received sitting EOB, agreeable to mobility. Required supervision to stand and ambulate with RW. Tolerated well,asx throughout. Left supine, all needs met.  Milah Recht Mobility Specialist Please contact via Special educational needs teacher or  Rehab office at [phone]

## 2024-03-21 NOTE — Inpatient Diabetes Management (Signed)
 Inpatient Diabetes Program Recommendations  AACE/ADA: New Consensus Statement on Inpatient Glycemic Control   Target Ranges:  Prepandial:   less than 140 mg/dL      Peak postprandial:   less than 180 mg/dL (1-2 hours)      Critically ill patients:  140 - 180 mg/dL    Latest Reference Range & Units 03/20/24 07:30 03/20/24 11:27 03/20/24 15:37 03/20/24 17:25 03/20/24 21:02 03/21/24 02:46 03/21/24 07:24  Glucose-Capillary 70 - 99 mg/dL 824 (H) 796 (H) 733 (H) 270 (H) 228 (H) 271 (H) 250 (H)   Review of Glycemic Control  Diabetes history: DM2 Outpatient Diabetes medications: Lantus  40 units BID, Novolog  18 units TID with meals Current orders for Inpatient glycemic control: Novolog  0-9 units TID with meals,  Novolog  3 units TID with meals  Inpatient Diabetes Program Recommendations:    Insulin : Please consider ordering insulin  glargine 7 units Q24H (based on 69.5 kg x 0.1 units) and adding Novolog  0-5 units at bedtime.  Thanks, Earnie Gainer, RN, MSN, CDCES Diabetes Coordinator Inpatient Diabetes Program [phone] (Team Pager from 8am to 5pm)

## 2024-03-21 NOTE — Progress Notes (Signed)
 Mobility Specialist Progress Note:    03/21/24 1125  Mobility  Activity Ambulated with assistance  Level of Assistance Standby assist, set-up cues, supervision of patient - no hands on  Assistive Device Front wheel walker  Distance Ambulated (ft) 120 ft  Range of Motion/Exercises Active;All extremities  Activity Response Tolerated well  Mobility Referral Yes  Mobility visit 1 Mobility  Mobility Specialist Start Time (ACUTE ONLY) 1124  Mobility Specialist Stop Time (ACUTE ONLY) 1145  Mobility Specialist Time Calculation (min) (ACUTE ONLY) 21 min   Pt received in bed, agreeable to mobility. Required supervision to stand and ambulate with RW. Tolerated well, SpO2 97% 2L at rest. SpO2 91-95% on 2L during ambulation, returned supine. All needs met.  Danthony Kendrix Mobility Specialist Please contact via Special educational needs teacher or  Rehab office at [phone]

## 2024-03-21 NOTE — Plan of Care (Signed)

## 2024-03-22 DIAGNOSIS — I5031 Acute diastolic (congestive) heart failure: Secondary | ICD-10-CM | POA: Diagnosis not present

## 2024-03-22 DIAGNOSIS — J189 Pneumonia, unspecified organism: Principal | ICD-10-CM

## 2024-03-22 DIAGNOSIS — J9601 Acute respiratory failure with hypoxia: Secondary | ICD-10-CM | POA: Diagnosis not present

## 2024-03-22 DIAGNOSIS — A0472 Enterocolitis due to Clostridium difficile, not specified as recurrent: Secondary | ICD-10-CM | POA: Diagnosis not present

## 2024-03-22 LAB — COMPREHENSIVE METABOLIC PANEL WITH GFR
ALT: 74 U/L — ABNORMAL HIGH (ref 0–44)
AST: 33 U/L (ref 15–41)
Albumin: 4 g/dL (ref 3.5–5.0)
Alkaline Phosphatase: 177 U/L — ABNORMAL HIGH (ref 38–126)
Anion gap: 12 (ref 5–15)
BUN: 11 mg/dL (ref 6–20)
CO2: 29 mmol/L (ref 22–32)
Calcium: 9.8 mg/dL (ref 8.9–10.3)
Chloride: 94 mmol/L — ABNORMAL LOW (ref 98–111)
Creatinine, Ser: 0.48 mg/dL (ref 0.44–1.00)
GFR, Estimated: >60 mL/min (ref >60)
Glucose, Bld: 215 mg/dL — ABNORMAL HIGH (ref 70–99)
Potassium: 3.9 mmol/L (ref 3.5–5.1)
Sodium: 135 mmol/L (ref 135–145)
Total Bilirubin: 0.4 mg/dL (ref 0.0–1.2)
Total Protein: 7 g/dL (ref 6.5–8.1)

## 2024-03-22 LAB — CBC
HCT: 36.7 % (ref 36.0–46.0)
Hemoglobin: 11.9 g/dL — ABNORMAL LOW (ref 12.0–15.0)
MCH: 30.1 pg (ref 26.0–34.0)
MCHC: 32.4 g/dL (ref 30.0–36.0)
MCV: 92.9 fL (ref 80.0–100.0)
Platelets: 372 K/uL (ref 150–400)
RBC: 3.95 MIL/uL (ref 3.87–5.11)
RDW: 12 % (ref 11.5–15.5)
WBC: 8.2 K/uL (ref 4.0–10.5)
nRBC: 0 % (ref 0.0–0.2)

## 2024-03-22 LAB — GLUCOSE, CAPILLARY
Glucose-Capillary: 250 mg/dL — ABNORMAL HIGH (ref 70–99)
Glucose-Capillary: 448 mg/dL — ABNORMAL HIGH (ref 70–99)
Glucose-Capillary: 221 mg/dL — ABNORMAL HIGH (ref 70–99)

## 2024-03-22 LAB — MAGNESIUM: Magnesium: 1.7 mg/dL (ref 1.7–2.4)

## 2024-03-22 MED ORDER — CEFDINIR 300 MG PO CAPS: 300.0000 mg | ORAL_CAPSULE | Freq: Two times a day (BID) | ORAL | Status: DC

## 2024-03-22 MED ORDER — LAMOTRIGINE 25 MG PO TABS: 25.0000 mg | ORAL_TABLET | Freq: Two times a day (BID) | ORAL | Status: DC

## 2024-03-22 MED ORDER — MAGNESIUM SULFATE 2 GM/50ML IV SOLN
2.0000 g | Freq: Once | INTRAVENOUS | Status: AC
  Administered 2024-03-22: 2 g via INTRAVENOUS
  Filled 2024-03-22: qty 50

## 2024-03-22 MED ORDER — INSULIN GLARGINE 100 UNIT/ML ~~LOC~~ SOLN
14.0000 [IU] | Freq: Every day | SUBCUTANEOUS | Status: DC
  Administered 2024-03-22: 14 [IU] via SUBCUTANEOUS
  Filled 2024-03-22 (×2): qty 0.14

## 2024-03-22 MED ORDER — VANCOMYCIN HCL 125 MG PO CAPS: 125.0000 mg | ORAL_CAPSULE | Freq: Four times a day (QID) | ORAL | Status: DC

## 2024-03-22 MED ORDER — LAMOTRIGINE 25 MG PO TABS: 25.0000 mg | ORAL_TABLET | Freq: Two times a day (BID) | ORAL | Status: AC

## 2024-03-22 MED ORDER — INSULIN ASPART 100 UNIT/ML IJ SOLN
15.0000 [IU] | Freq: Once | INTRAMUSCULAR | Status: AC
  Administered 2024-03-22: 15 [IU] via SUBCUTANEOUS

## 2024-03-22 MED ORDER — INSULIN ASPART 100 UNIT/ML IJ SOLN
5.0000 [IU] | Freq: Three times a day (TID) | INTRAMUSCULAR | Status: DC
Start: 2024-03-22 — End: 2024-03-22

## 2024-03-22 NOTE — TOC Transition Note (Signed)
 Transition of Care [facility]) - Discharge Note   Patient Details  Name: Nancy [name] MRN: [id number] Date of Birth: Nov 03, 1965  [facility] CM/SW Contact:  Lucie Lunger, LCSWA Phone Number: 03/22/2024, 1:54 PM   Clinical Narrative:    CSW updated pt is medically stable for D/C back to [facility]. CSW spoke to [place] with facility who states they are ready for her to return. Med necessity sent to floor for RN. RN provided with room and report numbers. EMS called for transport. TOC signing off.   Final next level of care: Long Term Nursing Home Barriers to Discharge: Barriers Resolved   Patient Goals and CMS Choice Patient states their goals for this hospitalization and ongoing recovery are:: return to LTC CMS Medicare.gov Compare Post Acute Care list provided to:: Patient Choice offered to / list presented to : Patient      Discharge Placement                Patient to be transferred to facility by: EMS Name of family member notified: patient aware Patient and family notified of of transfer: 03/22/24  Discharge Plan and Services Additional resources added to the After Visit Summary for   In-house Referral: Clinical Social Work Discharge Planning Services: CM Consult Post Acute Care Choice: Nursing Home                               Social Drivers of Health (SDOH) Interventions SDOH Screenings   Food Insecurity: No Food Insecurity (03/20/2024)  Recent Concern: Food Insecurity - Food Insecurity Present (01/24/2024)  Housing: Unknown (03/20/2024)  Recent Concern: Housing - High Risk (03/18/2024)  Transportation Needs: Unmet Transportation Needs (03/18/2024)  Utilities: At Risk (03/18/2024)  Financial Resource Strain: Not on File (10/08/2021)   Received from [facility]  Physical Activity: Not on File (10/08/2021)   Received from [facility]  Social Connections: Socially Isolated (03/18/2024)  Stress: Not on File (10/08/2021)   Received from [facility]  Tobacco Use:  High Risk (03/20/2024)     Readmission Risk Interventions    03/19/2024    1:54 PM  Readmission Risk Prevention Plan  Transportation Screening Complete  HRI or Home Care Consult Complete  Social Work Consult for Recovery Care Planning/Counseling Complete  Palliative Care Screening Not Applicable  Medication Review Oceanographer) Complete

## 2024-03-22 NOTE — Plan of Care (Signed)
  Problem: Clinical Measurements: Goal: Ability to maintain clinical measurements within normal limits will improve Outcome: Progressing   Problem: Coping: Goal: Level of anxiety will decrease Outcome: Progressing   Problem: Pain Managment: Goal: General experience of comfort will improve and/or be controlled Outcome: Progressing   Problem: Safety: Goal: Ability to remain free from injury will improve Outcome: Progressing   Problem: Education: Goal: Ability to demonstrate management of disease process will improve Outcome: Progressing   Problem: Cardiac: Goal: Ability to achieve and maintain adequate cardiopulmonary perfusion will improve Outcome: Progressing   Problem: Metabolic: Goal: Ability to maintain appropriate glucose levels will improve Outcome: Progressing

## 2024-03-22 NOTE — Progress Notes (Signed)
 Mobility Specialist Progress Note:    03/22/24 1100  Mobility  Activity Ambulated with assistance  Level of Assistance Standby assist, set-up cues, supervision of patient - no hands on  Assistive Device Front wheel walker  Distance Ambulated (ft) 130 ft  Range of Motion/Exercises Active;All extremities  Activity Response Tolerated well  Mobility Referral Yes  Mobility visit 1 Mobility  Mobility Specialist Start Time (ACUTE ONLY) 1100  Mobility Specialist Stop Time (ACUTE ONLY) 1120  Mobility Specialist Time Calculation (min) (ACUTE ONLY) 20 min   Pt received in bed, agreeable to mobility. Required supervision to stand and ambulate with RW. Tolerated well,asx throughout. Returned supine, all needs met.  Laurance Heide Mobility Specialist Please contact via Special educational needs teacher or  Rehab office at [phone]

## 2024-03-22 NOTE — Inpatient Diabetes Management (Signed)
 Inpatient Diabetes Program Recommendations  AACE/ADA: New Consensus Statement on Inpatient Glycemic Control (2015)  Target Ranges:  Prepandial:   less than 140 mg/dL      Peak postprandial:   less than 180 mg/dL (1-2 hours)      Critically ill patients:  140 - 180 mg/dL    Latest Reference Range & Units 03/21/24 07:24 03/21/24 11:14 03/21/24 16:32 03/21/24 20:03  Glucose-Capillary 70 - 99 mg/dL 749 (H)  6 units Novolog   224 (H)  6 units Novolog   7 units Lantus   298 (H)  8 units Novolog   358 (H)  5 units Novolog    (H): Data is abnormally high  Latest Reference Range & Units 03/22/24 07:28  Glucose-Capillary 70 - 99 mg/dL 749 (H)  (H): Data is abnormally high    Home DM Meds: Lantus  42 units BID Novolog  0-5 units TID with meals  Amaryl  2 mg daily  Current Orders: Lantus  7 units daily Novolog  Sensitive Correction Scale/ SSI (0-9 units) TID AC + HS Novolog  3 units TID with meals     MD- Note Lantus  7 units daily started yesterday  CBG 250 this AM  Please consider:  1. Increase Lantus  to 14 units daily (0.2 units/kg)  2. Increase Novolog  Meal Coverage to 5 units TID with meals    --Will follow patient during hospitalization--  Adina Rudolpho Arrow RN, MSN, CDCES Diabetes Coordinator Inpatient Glycemic Control Team Team Pager: [phone] (8a-5p)

## 2024-03-22 NOTE — Discharge Summary (Signed)
 Physician Discharge Summary   Patient: Nancy [name] MRN: [id number] DOB: June 10, 1966  Admit date:     03/18/2024  Discharge date: 03/22/24  Discharge Physician: Alm Cardelia Sassano   PCP: Earvin Johnston PARAS, FNP   Recommendations at discharge:   Please follow up with primary care provider within 1-2 weeks  Please repeat BMP, LFTs and CBC in one week     Hospital Course: 58 year old female with history of alcohol use disorder, detoxed approximately 1 month ago and currently residing at [place] [facility], recently discharged from [facility] family medicine service 1 month ago after being treated for pyelonephritis, past medical history significant for hepatic steatosis, substance abuse, currently on Suboxone, poorly controlled type 2 diabetes mellitus with hyperglycemia, osteoarthritis of the hips, hypoxia, history of DKA, depression and anxiety, chronic constipation who was brought in by [facility] from [place] [facility] for 2 days of colicky right upper quadrant abdominal pain that is progressively worsened.  Patient reportedly has a history of cholelithiasis.  Patient has complained of nausea but no emesis.  Patient reports she had a bout of severe flank pain on the right side that radiated down into her back and right leg.  Patient denies chest pain and shortness of breath.  Patient denies dysuria.  Patient denies fever and chills.  On arrival to the ED she was noted to be hypoxic with a pulse ox of 84% on room air.  Her workup in the ED significant for hypoglycemia, blood sugar 33, CTA chest PE with findings of bilateral pleural effusions and moderate bilateral pulmonary edema, no PE found, bilateral lower lobe airspace disease suggesting pneumonia and/or aspiration.  Abdominal ultrasound with findings of large gallstone within the gallbladder neck measuring 3.4 cm, Mildly dilated common bile duct measuring 9 mm without visible choledocholithiasis in the visualized proximal segment, gallbladder enlargement,  bladder wall thickening with concern for cystitis, hepatic steatosis.  She was started on IV antibiotics for pneumonia and admission requested for further management.   Assessment and Plan: Biliary Colic -- Patient presenting with symptoms of biliary colic -- had elevated LFTs but normal bili -- Imaging findings of cholelithiasis and common bile duct dilatation -- MRCP--no choledocholithiasis;  cholelithiasis without cholecystitis -- HIDA scan-patent CBD and early filling of GB -- continue IV antibiotics, IV metronidazole  to regimen initially -- diet advanced to soft diet>>tolerating -- surgery consulted, recommend outpatient follow up    Elevated LFTs--trending down [improving] -- choledocholithiasis ruled out, she may have passed a gallstone  -- Patient does have a history of hepatic steatosis -- RUQ US  GB neck gallstone without pericholecystic fluid -- Follow daily LFTs--trending down -- Lipase 29 -- abd pain resolved and tolerating diet at time of dc   Cdiff colitis -Cdiff antigen and PCR positive -start po vanc>>plan 10 days after d/c   Uncontrolled type 2 diabetes mellitus with hyperglycemia Hypoglycemia -- Add glargine 14 units, novolog  5 units with meals -- novolog  sliding scale -- 01/24/24  A1c>14%  -- d/c with PTA dose of glargine   Bilateral lobar pneumonia -- Provide coverage for HCAP and aspiration given image findings -- Continue IV antibiotics--cefepime -- Continue supportive measures -- Obtain blood cultures x 2--neg to date -- d/c with 2 more days cefdinir   Acute HFpEF -- evidence of pulmonary edema and bilateral pleural effusions on imaging -- increased lasix  to 60 mg bid -- NEG 7.4L --NEG 8 lbs--154 lbs at time of dc -- 02/11/24 Echo EF 60-65%, G1DD, normal RVF    Chronic pain  Opioid dependence -- PDMP reviewed-- belbuca 150 mcg, #6, last refill 03/15/24, 75 mcg on 03/12/24 -- resume when meds reconciled -- oxycodone  5 mg, #26, last refill 03/12/24 --  use nonopioid modalities for pain mgmt as much as possible -- toradol  ordered prn pain -restart cymbalta  and elavil   Tobacco use -- nicotine  patch 21 mg daily ordered    GERD  -- pantoprazole ordered for GI protection     Mood disorder -pt did not receive lamictal during admission -plan to restart titration after d/c -25 mg bid x 1 week, then increase to 50 mg bid there after        Consultants: general surgery Procedures performed: none  Disposition: Skilled nursing facility Diet recommendation:  Cardiac and Carb modified diet DISCHARGE MEDICATION: Allergies as of 03/22/2024       Reactions   Glucophage  [metformin ] Diarrhea        Medication List     STOP taking these medications    Constulose 10 GM/15ML solution Generic drug: lactulose   Journavx 50 MG Tabs Generic drug: Suzetrigine   oxyCODONE  5 MG immediate release tablet Commonly known as: Oxy IR/ROXICODONE    sulfamethoxazole -trimethoprim  800-160 MG tablet Commonly known as: BACTRIM  DS   Vitamin D (Ergocalciferol) 1.25 MG (50000 UNIT) Caps capsule Commonly known as: DRISDOL       TAKE these medications    Accu-Chek Guide Test test strip Generic drug: glucose blood Use to check blood sugar 4 (four) times daily -  before meals and at bedtime.   Accu-Chek Softclix Lancets lancets Use as instructed   acetaminophen  500 MG tablet Commonly known as: TYLENOL  Take 2 tablets (1,000 mg total) by mouth every 6 (six) hours as needed.   amitriptyline 75 MG tablet Commonly known as: ELAVIL Take 75 mg by mouth at bedtime.   atorvastatin  40 MG tablet Commonly known as: LIPITOR Take 1 tablet (40 mg total) by mouth daily.   Buprenorphine HCl 150 MCG Film Place 1 Film inside cheek 2 (two) times daily.   cefdinir 300 MG capsule Commonly known as: OMNICEF Take 1 capsule (300 mg total) by mouth 2 (two) times daily. X 2 days   Droplet Pen Needles 31G X 5 MM Misc Generic drug: Insulin  Pen Needle Use  with insulin  4 (four) times daily -  before meals and at bedtime.   DULoxetine  60 MG capsule Commonly known as: CYMBALTA  Take 1 capsule (60 mg total) by mouth daily.   Enulose 10 GM/15ML Soln Generic drug: lactulose (encephalopathy) Take 20 g by mouth daily.   fluconazole  150 MG tablet Commonly known as: DIFLUCAN  Take 150 mg by mouth every 3 (three) days.   fluticasone 50 MCG/ACT nasal spray Commonly known as: FLONASE Place 1 spray into both nostrils daily.   furosemide  40 MG tablet Commonly known as: LASIX  Take 80 mg by mouth 2 (two) times daily.   gabapentin  800 MG tablet Commonly known as: NEURONTIN  Take 800 mg by mouth 4 (four) times daily.   glimepiride  2 MG tablet Commonly known as: AMARYL  Take 2 mg by mouth daily.   GlycoLax  17 GM/SCOOP powder Generic drug: polyethylene glycol powder Take 17 g by mouth daily.   hydrOXYzine  25 MG tablet Commonly known as: ATARAX  Take 1 tablet (25 mg total) by mouth 3 (three) times daily as needed for anxiety.   insulin  aspart 100 UNIT/ML injection Commonly known as: novoLOG  Inject 0-15 Units into the skin 3 (three) times daily with meals. What changed:  how much to take  additional instructions   insulin  glargine 100 UNIT/ML injection Commonly known as: LANTUS  Inject 0.4 mLs (40 Units total) into the skin 2 (two) times daily. What changed: how much to take   lamoTRIgine 25 MG tablet Commonly known as: LAMICTAL Take 1 tablet (25 mg total) by mouth 2 (two) times daily. X one week, then increase to 50 mg bid What changed:  how much to take additional instructions Another medication with the same name was removed. Continue taking this medication, and follow the directions you see here.   lidocaine  5 % Commonly known as: LIDODERM  Place 1 patch onto the skin daily. Remove & Discard patch within 12 hours or as directed by MD   loratadine 10 MG tablet Commonly known as: CLARITIN Take 10 mg by mouth daily.   melatonin 3  MG Tabs tablet Take 3 mg by mouth at bedtime.   methocarbamol  750 MG tablet Commonly known as: ROBAXIN  Take 1 tablet (750 mg total) by mouth every 6 (six) hours as needed for muscle spasms.   multivitamin with minerals Tabs tablet Take 1 tablet by mouth daily.   naltrexone 50 MG tablet Commonly known as: DEPADE Take 50 mg by mouth daily.   nicotine  14 mg/24hr patch Commonly known as: NICODERM CQ  - dosed in mg/24 hours Place 1 patch (14 mg total) onto the skin daily.   saccharomyces boulardii 250 MG capsule Commonly known as: FLORASTOR Take 250 mg by mouth 2 (two) times daily.   senna 8.6 MG tablet Commonly known as: SENOKOT Take 2 tablets by mouth 2 (two) times daily.   thiamine  100 MG tablet Commonly known as: Vitamin B-1 Take 1 tablet (100 mg total) by mouth daily.   vancomycin 125 MG capsule Commonly known as: VANCOCIN Take 1 capsule (125 mg total) by mouth 4 (four) times daily. X 10 days   Ventolin  HFA 108 (90 Base) MCG/ACT inhaler Generic drug: albuterol  Inhale 1-2 puffs into the lungs every 4 (four) hours as needed for wheezing or shortness of breath.        Follow-up Information     [facility]. Go on 04/10/2024.   Specialty: Cardiology Why: Hospital Follow-Up 04/10/24 @ 12:00 [facility] , [address] Floor, [address] Contact information: [address] [place]  [address] [phone]               Discharge Exam: Fredricka Weights   03/20/24 0325 03/21/24 0500 03/22/24 0329  Weight: 75.3 kg 69.5 kg 70 kg   HEENT:  [place]/AT, No thrush, no icterus CV:  RRR, no rub, no S3, no S4 Lung:  CTA, no wheeze, no rhonchi Abd:  soft/+BS, NT Ext:  No edema, no lymphangitis, no synovitis, no rash   Condition at discharge: stable  The results of significant diagnostics from this hospitalization (including imaging, microbiology, ancillary and  laboratory) are listed below for reference.   Imaging Studies: DG CHEST PORT 1 VIEW Result Date: 03/20/2024 CLINICAL DATA:  Shortness of breath. History of congestive heart failure. EXAM: PORTABLE CHEST 1 VIEW COMPARISON:  Yesterday FINDINGS: Midline trachea. Mild cardiomegaly. Increased small left pleural effusion. Probable trace right pleural fluid. No pneumothorax. Improved interstitial edema with mild pulmonary venous congestion remaining. Increased bibasilar airspace disease. IMPRESSION: 1. Improved interstitial edema with mild pulmonary venous congestion remaining. 2. Increased small left and trace right pleural effusions with adjacent airspace disease, likely atelectasis. Cannot exclude developing left base airspace pneumonia or aspiration.  Electronically Signed   By: Rockey Kilts M.D.   On: 03/20/2024 16:11   NM Hepato W/EF Result Date: 03/20/2024 CLINICAL DATA:  Cholelithiasis. RIGHT upper quadrant pain. No leukocytosis. Normal bilirubin is EXAM: NUCLEAR MEDICINE HEPATOBILIARY IMAGING TECHNIQUE: Sequential images of the abdomen were obtained out to 60 minutes following intravenous administration of radiopharmaceutical. Patient extremely claustrophobic.  Only 10 minutes of imaging. RADIOPHARMACEUTICALS:  5.4 mCi Tc-70m  Choletec IV COMPARISON:  CT and MRI 03/18/24 FINDINGS: Prompt clearance radiotracer from blood pool and homogeneous uptake in liver. Counts are evident in the common bile duct and small bowel by 10 minutes. The gallbladder appears to begin to fill. Exam was terminated after only 10 minutes imaging due to claustrophobia. IMPRESSION: 1. Limited exam due to short duration imaging as above. 2. Patent common bile duct. 3. Suggestion of early filling of the gallbladder. Electronically Signed   By: Jackquline Boxer M.D.   On: 03/20/2024 09:11   DG Chest Port 1 View Result Date: 03/19/2024 CLINICAL DATA:  58 year old female with pulmonary edema, heart failure. EXAM: PORTABLE CHEST 1 VIEW  COMPARISON:  Chest CTA yesterday. FINDINGS: Portable AP supine views at 0537 hours. Bilateral pulmonary edema, with small layering pleural effusions better demonstrated on CTA. Compared to portable chest yesterday there is decreased fluid in the right minor fissure. Pulmonary vascular congestion has not improved. Larger lung volumes. Stable cardiac size and mediastinal contours. No pneumothorax or consolidation. Visualized tracheal air column is within normal limits. Stable visualized osseous structures. Negative visible bowel gas. IMPRESSION: Ongoing pulmonary edema with larger lung volumes and mildly decreased appearance of pleural fluid. Electronically Signed   By: VEAR Hurst M.D.   On: 03/19/2024 06:14   MR ABDOMEN MRCP W WO CONTAST Result Date: 03/18/2024 CLINICAL DATA:  Cholelithiasis.  RIGHT upper quadrant pain. EXAM: MRI ABDOMEN WITHOUT AND WITH CONTRAST (INCLUDING MRCP) TECHNIQUE: Multiplanar multisequence MR imaging of the abdomen was performed both before and after the administration of intravenous contrast. Heavily T2-weighted images of the biliary and pancreatic ducts were obtained, and three-dimensional MRCP images were rendered by post processing. CONTRAST:  7mL GADAVIST GADOBUTROL 1 MMOL/ML IV SOLN COMPARISON:  Ultrasound same day, CT chest same day FINDINGS: Lower chest:  Bibasilar effusions and atelectasis. Hepatobiliary: No intrahepatic or extrahepatic biliary duct dilatation. No filling defect within the common bile duct. The common hepatic duct measures 8 mm. The common bile duct measures 5 mm. Large ovoid stone within the proximal gallbladder measures 3.6 cm. No pericholecystic fluid. No gallbladder wall thickening. Pancreas: Normal pancreatic parenchymal intensity. No ductal dilatation or inflammation. Spleen: Normal spleen. Adrenals/urinary tract: Adrenal glands and kidneys are normal. Stomach/Bowel: Stomach and limited of the small bowel is unremarkable Vascular/Lymphatic: Abdominal aortic  normal caliber. No retroperitoneal periportal lymphadenopathy. Musculoskeletal: No aggressive osseous lesion IMPRESSION: 1. Large gallstone without evidence acute cholecystitis. 2. No choledocholithiasis. 3. No biliary duct dilatation. 4. Normal pancreas. 5. Bibasilar effusions and atelectasis. Electronically Signed   By: Jackquline Boxer M.D.   On: 03/18/2024 15:51   MR 3D Recon At Scanner Result Date: 03/18/2024 CLINICAL DATA:  Cholelithiasis.  RIGHT upper quadrant pain. EXAM: MRI ABDOMEN WITHOUT AND WITH CONTRAST (INCLUDING MRCP) TECHNIQUE: Multiplanar multisequence MR imaging of the abdomen was performed both before and after the administration of intravenous contrast. Heavily T2-weighted images of the biliary and pancreatic ducts were obtained, and three-dimensional MRCP images were rendered by post processing. CONTRAST:  7mL GADAVIST GADOBUTROL 1 MMOL/ML IV SOLN COMPARISON:  Ultrasound same day, CT chest same  day FINDINGS: Lower chest:  Bibasilar effusions and atelectasis. Hepatobiliary: No intrahepatic or extrahepatic biliary duct dilatation. No filling defect within the common bile duct. The common hepatic duct measures 8 mm. The common bile duct measures 5 mm. Large ovoid stone within the proximal gallbladder measures 3.6 cm. No pericholecystic fluid. No gallbladder wall thickening. Pancreas: Normal pancreatic parenchymal intensity. No ductal dilatation or inflammation. Spleen: Normal spleen. Adrenals/urinary tract: Adrenal glands and kidneys are normal. Stomach/Bowel: Stomach and limited of the small bowel is unremarkable Vascular/Lymphatic: Abdominal aortic normal caliber. No retroperitoneal periportal lymphadenopathy. Musculoskeletal: No aggressive osseous lesion IMPRESSION: 1. Large gallstone without evidence acute cholecystitis. 2. No choledocholithiasis. 3. No biliary duct dilatation. 4. Normal pancreas. 5. Bibasilar effusions and atelectasis. Electronically Signed   By: Jackquline Boxer M.D.   On:  03/18/2024 15:51   US  Venous Img Lower Unilateral Right Result Date: 03/18/2024 CLINICAL DATA:  Right lower extremity pain from the right hip to the right ankle. EXAM: RIGHT LOWER EXTREMITY VENOUS DOPPLER ULTRASOUND TECHNIQUE: Gray-scale sonography with graded compression, as well as color Doppler and duplex ultrasound were performed to evaluate the lower extremity deep venous systems from the level of the common femoral vein and including the common femoral, femoral, profunda femoral, popliteal and calf veins including the posterior tibial, peroneal and gastrocnemius veins when visible. The superficial great saphenous vein was also interrogated. Spectral Doppler was utilized to evaluate flow at rest and with distal augmentation maneuvers in the common femoral, femoral and popliteal veins. COMPARISON:  None Available. FINDINGS: Contralateral Common Femoral Vein: Respiratory phasicity is normal and symmetric with the symptomatic side. No evidence of thrombus. Normal compressibility. Common Femoral Vein: No evidence of thrombus. Normal compressibility, respiratory phasicity and response to augmentation. Saphenofemoral Junction: No evidence of thrombus. Normal compressibility and flow on color Doppler imaging. Profunda Femoral Vein: No evidence of thrombus. Normal compressibility and flow on color Doppler imaging. Femoral Vein: No evidence of thrombus. Normal compressibility, respiratory phasicity and response to augmentation. Popliteal Vein: No evidence of thrombus. Normal compressibility, respiratory phasicity and response to augmentation. Calf Veins: No evidence of thrombus. Normal compressibility and flow on color Doppler imaging. Superficial Great Saphenous Vein: No evidence of thrombus. Normal compressibility. Venous Reflux:  None. Other Findings:  None. IMPRESSION: No evidence of deep venous thrombosis within the RIGHT lower extremity. Electronically Signed   By: Suzen Dials M.D.   On: 03/18/2024 14:42    CT Angio Chest PE W and/or Wo Contrast Result Date: 03/18/2024 EXAM: CTA CHEST 03/18/2024 12:56:00 PM TECHNIQUE: CTA of the chest was performed without and with the administration of 75 mL of intravenous contrast (iohexol  (OMNIPAQUE ) 350 MG/ML injection). Multiplanar reformatted images are provided for review. MIP images are provided for review. Automated exposure control, iterative reconstruction, and/or weight based adjustment of the mA/kV was utilized to reduce the radiation dose to as low as reasonably achievable. COMPARISON: 10/26/2023 CLINICAL HISTORY: Pulmonary embolism (PE) suspected, low to intermediate prob, positive D-dimer. PE suspected; Positive D-dimer; Omnipaque  350; 75 cc; bib [facility] from [place] [facility] for RUQ pain x2 days that has progressively gotten worse. Hx of gallstones. C/o of nausea also. FINDINGS: PULMONARY ARTERIES: Pulmonary arteries are adequately opacified for evaluation. No acute pulmonary embolus. Main pulmonary artery is normal in caliber. MEDIASTINUM: The heart and pericardium demonstrate multivessel coronary artery calcifications. There is aortic atherosclerosis. No acute abnormality of the thoracic aorta. LYMPH NODES: Enlarged mediastinal and bilateral hilar lymph nodes are identified. For example, right hilar node measures 1.7 cm (image 127/5),  previously 1.5 cm. Left paratracheal lymph node measures 1.7 cm, previously 1 cm. Subcarinal node measures 1.6 cm (image 134/5), previously 1.4 cm. LUNGS AND PLEURA: Small bilateral pleural effusions, left greater than right. No pneumothorax identified. Diffuse interlobular septal thickening with ground glass attenuation noted throughout both lungs compatible with pulmonary edema. Bilateral lower lobe airspace consolidation concerning for pneumonia or aspiration. Persistent non-solid nodule within the left upper lobe containing ground glass and cystic components is stable measuring 1.3 cm (image 58/6). UPPER ABDOMEN: Large calcified  stone within the gallbladder neck measures 3.7 cm. SOFT TISSUES AND BONES: No acute bone or soft tissue abnormality. No acute or suspicious osseous findings. IMPRESSION: 1. No evidence of pulmonary embolism. 2. Bilateral pleural effusions and moderate diffuse pulmonary edema. 3. Bilateral lower lobe airspace consolidation concerning for pneumonia or aspiration. 4. Enlarged mediastinal and bilateral hilar lymph nodes, including right hilar (1.7 cm), left paratracheal (1.7 cm), and subcarinal (1.6 cm) nodes. Likely reactive in the setting of chf and pneumonia. 5. Large calcified gallstone within the gallbladder neck measuring 3.7 cm. 6. Aortic atherosclerosis and multivessel coronary artery calcifications. Electronically signed by: Waddell Calk MD 03/18/2024 01:35 PM EDT RP Workstation: [id number]   US  ABDOMEN LIMITED RUQ (LIVER/GB) Result Date: 03/18/2024 EXAM: Right Upper Quadrant Abdominal Ultrasound 03/18/2024 12:29:49 PM TECHNIQUE: Real-time ultrasonography of the right upper quadrant of the abdomen was performed. COMPARISON: CT of the abdomen and pelvis from earlier today. CLINICAL HISTORY: RUQ abdominal pain. FINDINGS: LIVER: Increased parenchymal echogenicity of the liver. The portal vein is patent with normal flow towards the liver. No intrahepatic biliary ductal dilatation. No evidence of mass. BILIARY SYSTEM: The gallbladder wall measures 3.5 mm in thickness. There is a large shadowing stone within the gallbladder neck measuring 3.4 cm. No pericholecystic fluid or sonographic Murphy's sign. The common bile duct is increasing caliber measuring 9 mm. No stones identified within the visualized proximal portion of the CBD. RIGHT KIDNEY: The right kidney is grossly unremarkable in appearances without evidence of hydronephrosis, echogenic calculi or worrisome mass lesions. PANCREAS: The pancreas is not well visualized. OTHER: Small right pleural effusion is incidentally noted. No right upper quadrant ascites.  IMPRESSION: 1. Large shadowing gallstone within the gallbladder neck measuring 3.4 cm. The gallbladder wall is mildly thickened, measuring 3.5 mm. No pericholecystic fluid or sonographic murphy sign. 2. Mildly dilated common bile duct measuring 9 mm without visible choledocholithiasis in the visualized proximal segment; correlate with liver function tests and consider further evaluation with MRCP if clinically indicated. 3. Increased hepatic parenchymal echogenicity, compatible with hepatic steatosis. 4. Small right pleural effusion incidentally noted. Electronically signed by: Waddell Calk MD 03/18/2024 12:51 PM EDT RP Workstation: [id number]   CT Renal Stone Study Result Date: 03/18/2024 CLINICAL DATA:  Abdominal/flank pain, stone suspected EXAM: CT ABDOMEN AND PELVIS WITHOUT CONTRAST TECHNIQUE: Multidetector CT imaging of the abdomen and pelvis was performed following the standard protocol without IV contrast. RADIATION DOSE REDUCTION: This exam was performed according to the departmental dose-optimization program which includes automated exposure control, adjustment of the mA and/or kV according to patient size and/or use of iterative reconstruction technique. COMPARISON:  January 24, 2024 FINDINGS: Evaluation is limited by lack of IV contrast. Lower chest: Small bilateral pleural effusions with bibasilar heterogeneous airspace opacities and bronchial wall thickening. Mild interlobular septal thickening. Hepatobiliary: Large cholelithiasis. The gallbladder is distended. No definitive pericholecystic fluid. There is a single with area of fat stranding along the inferior margin of the gallbladder, nonspecific (series 5, image 35). Fluid.  No extrahepatic biliary ductal dilation. Periportal edema. Pancreas: No new peripancreatic fat stranding. Relatively atrophic appearance of the pancreas. Spleen: Unremarkable. Adrenals/Urinary Tract: Adrenal glands are unremarkable. No hydronephrosis. No obstructing  nephrolithiasis. Circumferential wall thickening with faint adjacent fat stranding of the bladder. Stomach/Bowel: No evidence of bowel obstruction. Appendix is unremarkable. Stomach is decompressed, limiting evaluation. Favor small hiatal hernia. Vascular/Lymphatic: Atherosclerotic calcifications of the nonaneurysmal abdominal aorta. Prominent retroperitoneal lymph nodes with representative LEFT periaortic lymph node measuring 9 mm in the short axis, similar compared to prior (series 2, image 28). Perigastric lymph node measuring 12 mm in the short axis, previously 11 mm (series 2, image 15). Aortocaval node measuring 8 mm in the short axis, unchanged (series 2, image 29). Representative RIGHT inguinal lymph node measures 13 mm in the short axis, similar compared to prior (series 2, image 86). Reproductive: Status post hysterectomy. No adnexal masses. Other: Trace free fluid in the pelvis.  Anasarca. Musculoskeletal: Advanced degenerative changes of bilateral hips. Linear horizontal lucency traversing the RIGHT sacrum (series 5, image 85). IMPRESSION: 1. No hydronephrosis or obstructing nephrolithiasis. 2. Circumferential wall thickening of the bladder with faint adjacent fat stranding. Recommend correlation with urinalysis to exclude cystitis. 3. Large cholelithiasis with distended gallbladder. There is a single focus of fat stranding along the inferior margin of the gallbladder, nonspecific in a background of anasarca and favored to be incidental. If there is persistent clinical concern for acute cholecystitis, HIDA scan would be the modality of choice for further evaluation. 4. Small bilateral pleural effusions with bibasilar heterogeneous airspace opacities and bronchial wall thickening. Findings are favored to reflect pulmonary edema with superimposed atelectasis. 5. Linear horizontal lucency traversing the RIGHT sacrum. This could reflect a nondisplaced fracture versus prominent nutrient foramen. Recommend  correlation with point tenderness. 6. Prominent retroperitoneal and inguinal adenopathy, nonspecific and possibly reactive. Aortic Atherosclerosis (ICD10-I70.0). Electronically Signed   By: Corean Salter M.D.   On: 03/18/2024 12:07   DG Chest Port 1 View Result Date: 03/18/2024 EXAM: 1 VIEW(S) XRAY OF THE CHEST 03/18/2024 11:00:00 AM COMPARISON: 02/14/2024 CLINICAL HISTORY: hypoxia. Per chart; Pt bib ccems from [place] [facility] for RUQ pain x2 days that has progressively gotten worse. Hx of gallstones. C/o of nausea also. FINDINGS: LUNGS AND PLEURA: Small bilateral pleural effusions. Mild interstitial edema. Bibasilar atelectasis. No focal pulmonary opacity. No pneumothorax. HEART AND MEDIASTINUM: No acute abnormality of the cardiac and mediastinal silhouettes. BONES AND SOFT TISSUES: No acute osseous abnormality. IMPRESSION: 1. Small bilateral pleural effusions. 2. Mild interstitial edema. 3. Bibasilar atelectasis. Electronically signed by: Waddell Calk MD 03/18/2024 11:13 AM EDT RP Workstation: [id number]    Microbiology: Results for orders placed or performed during the hospital encounter of 03/18/24  Culture, blood (Routine X 2) w Reflex to ID Panel     Status: None (Preliminary result)   Collection Time: 03/18/24  4:20 PM   Specimen: Left Antecubital; Blood  Result Value Ref Range Status   Specimen Description   Final    LEFT ANTECUBITAL BOTTLES DRAWN AEROBIC AND ANAEROBIC   Special Requests Blood Culture adequate volume  Final   Culture   Final    NO GROWTH 4 DAYS Performed at [facility], [address], [place], [place] [address]    Report Status PENDING  Incomplete  Culture, blood (Routine X 2) w Reflex to ID Panel     Status: None (Preliminary result)   Collection Time: 03/18/24  4:20 PM   Specimen: BLOOD RIGHT HAND  Result Value Ref Range  Status   Specimen Description   Final    BLOOD RIGHT HAND BOTTLES DRAWN AEROBIC AND ANAEROBIC   Special Requests Blood Culture adequate  volume  Final   Culture   Final    NO GROWTH 4 DAYS Performed at [facility], [address] [place], [place] [address]    Report Status PENDING  Incomplete  MRSA Next Gen by PCR, Nasal     Status: None   Collection Time: 03/19/24  2:40 PM   Specimen: Nasal Mucosa; Nasal Swab  Result Value Ref Range Status   MRSA by PCR Next Gen NOT DETECTED NOT DETECTED Final    Comment: (NOTE) The GeneXpert MRSA Assay (FDA approved for NASAL specimens only), is one component of a comprehensive MRSA colonization surveillance program. It is not intended to diagnose MRSA infection nor to guide or monitor treatment for MRSA infections. Test performance is not FDA approved in patients less than 36 years old. Performed at [facility], [address], [place], [place] [address]   C Difficile Quick Screen w PCR reflex     Status: Abnormal   Collection Time: 03/20/24  8:28 PM   Specimen: STOOL  Result Value Ref Range Status   C Diff antigen POSITIVE (A) NEGATIVE Final   C Diff toxin NEGATIVE NEGATIVE Final   C Diff interpretation Results are indeterminate. See PCR results.  Final    Comment: Performed at [facility], [address] [place], [place] [address]  C. Diff by PCR, Reflexed     Status: Abnormal   Collection Time: 03/20/24  8:28 PM  Result Value Ref Range Status   Toxigenic C. Difficile by PCR POSITIVE (A) NEGATIVE Final    Comment: Positive for toxigenic C. difficile with little to no toxin production. Only treat if clinical presentation suggests symptomatic illness.   Hypervirulent Strain PRESUMPTIVE NEGATIVE PRESUMPTIVE NEGATIVE Final    Comment: Performed at [facility], [address] [place], [place] [address]    Labs: CBC: Recent Labs  Lab 03/18/24 1114 03/18/24 1620 03/19/24 0507 03/20/24 0447 03/21/24 0435 03/22/24 0439  WBC 9.3 8.6 6.5 8.7 8.7 8.2  NEUTROABS 6.1  --   --   --   --   --   HGB 11.5* 10.8* 10.1* 10.6* 11.0* 11.9*  HCT 35.8* 33.2* 31.5* 32.5* 33.1*  36.7  MCV 94.5 94.6 94.6 93.4 92.2 92.9  PLT 307 310 276 303 313 372   Basic Metabolic Panel: Recent Labs  Lab 03/18/24 1114 03/19/24 0507 03/20/24 0447 03/21/24 0435 03/22/24 0439  NA 136 135 134* 134* 135  K 4.0 3.9 3.7 4.1 3.9  CL 98 100 97* 97* 94*  CO2 27 26 28 26 29   GLUCOSE 52* 88 165* 275* 215*  BUN 11 11 9 8 11   CREATININE 0.34* 0.42* 0.46 0.47 0.48  CALCIUM  9.2 8.4* 8.7* 9.4 9.8  MG  --   --   --   --  1.7   Liver Function Tests: Recent Labs  Lab 03/18/24 1114 03/19/24 0507 03/20/24 0447 03/21/24 0435 03/22/24 0439  AST 86* 58* 44* 30 33  ALT 143* 98* 88* 77* 74*  ALKPHOS 197* 160* 165* 179* 177*  BILITOT 0.4 0.4 0.5 0.4 0.4  PROT 7.0 5.6* 6.1* 6.4* 7.0  ALBUMIN 3.4* 2.8* 3.1* 3.7 4.0   CBG: Recent Labs  Lab 03/21/24 1114 03/21/24 1632 03/21/24 2003 03/22/24 0728 03/22/24 1137  GLUCAP 224* 298* 358* 250* 448*    Discharge time spent: greater  than 30 minutes.  Signed: Alm Schneider, MD [facility] 03/22/2024

## 2024-03-23 LAB — CULTURE, BLOOD (ROUTINE X 2)
Culture: NO GROWTH
Culture: NO GROWTH
Special Requests: ADEQUATE
Special Requests: ADEQUATE

## 2024-03-27 ENCOUNTER — Encounter: Admitting: Family

## 2024-03-27 LAB — CBC WITH DIFFERENTIAL: A1c: 8.5

## 2024-04-09 ENCOUNTER — Telehealth: Payer: Self-pay | Admitting: Family

## 2024-04-09 NOTE — Progress Notes (Unsigned)
 Advanced Heart Failure Clinic Note   Referring Physician: PCP: Earvin Johnston PARAS, FNP PCP-Cardiologist: None   Chief Complaint: Occasional PND and orthopnea   HPI: Ms. Finkbiner is a 58 yo female with a history of alcohol use, substance abuse, hepatic steatosis, uncontrolled T2DM, DKA,OA, HLD, asthma, depression, anxiety, and cholelithiasis.   ED visit 10/25/23 for left sided chest pain and pressure. Labs showed blood glucose of 604, anion gap of 14, hydroxybutyrate acid of 3.8, and ketones urine. Diagnosed with DKA. Troponin and CXR negative. EKG NSR. Given IV insulin  and discharged.   Echo 10/26/23 EF: 55-60%   ED visit 01/04/24 and 01/24/24 for falls.   Echo 02/11/24 EF: 60-65% with MVD, trivial MR, and AVR. Mobile calcified material with non-coronary cusp.   ED visit 03/18/24 with presentation of hypoxemia O2 of 84%, RA and hypogylcemia with blood sugar of 33. Diagnosed with acute respiratory failure with hypoxia. CT Chest showed bilateral pleural effusions and moderate pulmonary edema, with no PE. Abdominal US  showed large gallstone without cholelithiasis. Given IV and oral antibiotics.    She presents today in the [facility] for an initial TOC visit with a chief complaint of occasional PND and orthopnea. Has associated intermittent fatigue, dizziness, and palpitations. Believes she has sleep apnea and needs a sleep study. Denies chest pain, cough, abdominal distention, and edema. Overall, has had no worsening symptoms since discharge.   Currently resides in nursing rehab facility in [place]. Medications are managed by facility.   She is adding salt to meals and has limited in options due to meals being served a facility. She is weighing consistently. Weight has ranged from 150.2 - 154.4lbs over the last 2 weeks. BP and Glucose is stable is monitored daily at facility.   Does have pain in hips and knees due to OA. Plans to have hip surgery wants A1C <7. Further reports muscle spasms with no specific  origin.   Has been on suboxone for ~ 3 weeks. No alcohol or drug use in the last 4 months. Smokes on average 1ppd when affordable.   Review of Systems: [y] = yes, [ ]  = no   General: Weight gain [ ] ; Weight loss [ ] ; Anorexia [ ] ; Fatigue [ ] ; Fever [ ] ; Chills [ ] ; Weakness [X]   Cardiac: Chest pain/pressure [ ] ; Resting SOB [ ] ; Exertional SOB [ ] ; Orthopnea Galerius.Gant ]; Pedal Edema [ ] ; Palpitations [ ] ; Syncope [ ] ; Presyncope [ ] ; Paroxysmal nocturnal dyspnea[X]   Pulmonary: Cough [ ] ; Wheezing[ ] ; Hemoptysis[ ] ; Sputum [ ] ; Snoring [ ]   GI: Vomiting[ ] ; Dysphagia[ ] ; Melena[ ] ; Hematochezia [ ] ; Heartburn[ ] ; Abdominal pain [ ] ; Constipation [ ] ; Diarrhea [ ] ; BRBPR [ ]   GU: Hematuria[ ] ; Dysuria [ ] ; Nocturia[ ]   Vascular: Pain in legs with walking [] ; Pain in feet with lying flat [ ] ; Non-healing sores [ ] ; Stroke [ ] ; TIA [ ] ; Slurred speech [ ] ;  Neuro: Headaches[ ] ; Vertigo[ ] ; Seizures[ ] ; Paresthesias[ ] ;Blurred vision [ ] ; Diplopia [ ] ; Vision changes [ ]   Ortho/Skin: Arthritis [X] ; Joint pain [ ] ; Muscle pain [X] ; Joint swelling [ ] ; Back Pain [ ] ; Rash [ ]   Psych: Depression[ X]; Anxiety[X ]  Heme: Bleeding problems [ ] ; Clotting disorders [ ] ; Anemia [ ]   Endocrine: Diabetes [X] ; Thyroid  dysfunction[ ]    Past Medical History:  Diagnosis Date   Abnormal Pap smear    Bronchitis    Depression    Diabetes (HCC)  GERD (gastroesophageal reflux disease)    tums prn   Headache(784.0)    Polysubstance abuse (HCC)     Current Outpatient Medications  Medication Sig Dispense Refill   Accu-Chek Softclix Lancets lancets Use as instructed 100 each 12   acetaminophen  (TYLENOL ) 500 MG tablet Take 2 tablets (1,000 mg total) by mouth every 6 (six) hours as needed.     albuterol  (VENTOLIN  HFA) 108 (90 Base) MCG/ACT inhaler Inhale 1-2 puffs into the lungs every 4 (four) hours as needed for wheezing or shortness of breath. 18 g 2   amitriptyline (ELAVIL) 75 MG tablet Take 75 mg by mouth at  bedtime.     atorvastatin  (LIPITOR) 40 MG tablet Take 1 tablet (40 mg total) by mouth daily. 90 tablet 3   Buprenorphine HCl 150 MCG FILM Place 1 Film inside cheek 2 (two) times daily.     cefdinir (OMNICEF) 300 MG capsule Take 1 capsule (300 mg total) by mouth 2 (two) times daily. X 2 days     DULoxetine  (CYMBALTA ) 60 MG capsule Take 1 capsule (60 mg total) by mouth daily.     fluconazole  (DIFLUCAN ) 150 MG tablet Take 150 mg by mouth every 3 (three) days.     fluticasone (FLONASE) 50 MCG/ACT nasal spray Place 1 spray into both nostrils daily.     furosemide  (LASIX ) 40 MG tablet Take 80 mg by mouth 2 (two) times daily.     gabapentin  (NEURONTIN ) 800 MG tablet Take 800 mg by mouth 4 (four) times daily.     glimepiride  (AMARYL ) 2 MG tablet Take 2 mg by mouth daily.     Glucose Blood (BLOOD GLUCOSE TEST STRIPS) STRP Use to check blood sugar 4 (four) times daily -  before meals and at bedtime. 200 strip 3   hydrOXYzine  (ATARAX ) 25 MG tablet Take 1 tablet (25 mg total) by mouth 3 (three) times daily as needed for anxiety.     insulin  aspart (NOVOLOG ) 100 UNIT/ML injection Inject 0-15 Units into the skin 3 (three) times daily with meals. (Patient taking differently: Inject 0-5 Units into the skin 3 (three) times daily with meals. Inject as per sliding scale: if 70-150=0,151-200=1.201-250=2,251-300=3,301-350=4,351-400=5 greater than 401 call md, subcutaneously with meals)     insulin  glargine (LANTUS ) 100 UNIT/ML injection Inject 0.4 mLs (40 Units total) into the skin 2 (two) times daily. (Patient taking differently: Inject 42 Units into the skin 2 (two) times daily.)     Insulin  Pen Needle (PEN NEEDLES) 31G X 5 MM MISC Use with insulin  4 (four) times daily -  before meals and at bedtime. 100 each 3   lactulose, encephalopathy, (ENULOSE) 10 GM/15ML SOLN Take 20 g by mouth daily.     lamoTRIgine (LAMICTAL) 25 MG tablet Take 1 tablet (25 mg total) by mouth 2 (two) times daily. X one week, then increase to 50  mg bid     lidocaine  (LIDODERM ) 5 % Place 1 patch onto the skin daily. Remove & Discard patch within 12 hours or as directed by MD     loratadine (CLARITIN) 10 MG tablet Take 10 mg by mouth daily.     melatonin 3 MG TABS tablet Take 3 mg by mouth at bedtime.     methocarbamol  (ROBAXIN ) 750 MG tablet Take 1 tablet (750 mg total) by mouth every 6 (six) hours as needed for muscle spasms.     Multiple Vitamin (MULTIVITAMIN WITH MINERALS) TABS tablet Take 1 tablet by mouth daily.     naltrexone (DEPADE)  50 MG tablet Take 50 mg by mouth daily.     nicotine  (NICODERM CQ  - DOSED IN MG/24 HOURS) 14 mg/24hr patch Place 1 patch (14 mg total) onto the skin daily.     polyethylene glycol powder (GLYCOLAX ) 17 GM/SCOOP powder Take 17 g by mouth daily.     saccharomyces boulardii (FLORASTOR) 250 MG capsule Take 250 mg by mouth 2 (two) times daily.     senna (SENOKOT) 8.6 MG tablet Take 2 tablets by mouth 2 (two) times daily.     thiamine  (VITAMIN B-1) 100 MG tablet Take 1 tablet (100 mg total) by mouth daily.     vancomycin (VANCOCIN) 125 MG capsule Take 1 capsule (125 mg total) by mouth 4 (four) times daily. X 10 days     No current facility-administered medications for this visit.    Allergies  Allergen Reactions   Glucophage  Cale.[place] ] Diarrhea      Social History   Socioeconomic History   Marital status: Single    Spouse name: Not on file   Number of children: Not on file   Years of education: Not on file   Highest education level: 9th grade  Occupational History   Not on file  Tobacco Use   Smoking status: Every Day    Current packs/day: 1.00    Average packs/day: 1 pack/day for 20.0 years (20.0 ttl pk-yrs)    Types: Cigarettes   Smokeless tobacco: Never  Substance and Sexual Activity   Alcohol use: Not Currently    Alcohol/week: 7.0 standard drinks of alcohol    Types: 7 Cans of beer per week    Comment: couple of beers a week prior to SNF   Drug use: Not Currently    Types:  Crack cocaine, Marijuana    Comment: Pills(Vicodin) heroin (states no today 09/04/15)   Sexual activity: Yes    Birth control/protection: None    Comment: Pt in rehab for drug and alcohol use  Other Topics Concern   Not on file  Social History Narrative   Not on file   Social Drivers of Health   Financial Resource Strain: Not on File (10/08/2021)   Received from [employer]    Financial Resource Strain: 0  Food Insecurity: No Food Insecurity (03/20/2024)   Hunger Vital Sign    Worried About Running Out of Food in the Last Year: Never true    Ran Out of Food in the Last Year: Never true  Recent Concern: Food Insecurity - Food Insecurity Present (01/24/2024)   Hunger Vital Sign    Worried About Running Out of Food in the Last Year: Often true    Ran Out of Food in the Last Year: Often true  Transportation Needs: Unmet Transportation Needs (03/18/2024)   PRAPARE - Administrator, Civil Service (Medical): Yes    Lack of Transportation (Non-Medical): Yes  Physical Activity: Not on File (10/08/2021)   Received from [facility]   Physical Activity    Physical Activity: 0  Stress: Not on File (10/08/2021)   Received from [facility]   Stress    Stress: 0  Social Connections: Socially Isolated (03/18/2024)   Social Connection and Isolation Panel    Frequency of Communication with Friends and Family: Once a week    Frequency of Social Gatherings with Friends and Family: Never    Attends Religious Services: Never    Database administrator or Organizations: No    Attends Banker  Meetings: Never    Marital Status: Separated  Intimate Partner Violence: Not At Risk (03/18/2024)   Humiliation, Afraid, Rape, and Kick questionnaire    Fear of Current or Ex-Partner: No    Emotionally Abused: No    Physically Abused: No    Sexually Abused: No      Family History  Problem Relation Age of Onset   Cancer Mother        unsure    There were no vitals filed  for this visit. Vitals:   04/10/24 1143  BP: 105/75  Pulse: (!) 111  SpO2: 96%  Weight: 68.6 kg  Height: 5' 8.5 (1.74 m)   Wt Readings from Last 3 Encounters:  04/10/24 68.6 kg  03/22/24 70 kg  02/15/24 68.5 kg    ECG: Sinus Tachycardia Personally reviewed   PHYSICAL EXAM: General: Well appearing female in wheelchair. No acute signs of distress.  Cor: No JVD. Regular rhythm, rate.  Lungs: Clear bilaterally. Symmetrical chest expansion.  Abdomen: Soft, nontender, nondistended. Extremities: No edema, cyanosis, or clubbing. Limited mobility due to OA.  Neuro:. AO x 3. Affect pleasant   ECG: Sinus Tachycardia Personally reviewed.   ASSESSMENT & PLAN:  (HFpEF) Acute heart failure with preserved ejection fraction - suspect due to alcohol and substance abuse? - NYHA II - euvolemic  - Echo 02/11/24 EF: 60-65% with MVD, trivial MR, and AVR. Mobile calcified material with non-coronary cusp.  - continue weighing daily - weight stable over the last 2 weeks per self reported tracking  - maintain fluid restriction 60-64 oz and sodium restriction 2000mg   - report weight gain of 2-3lbs/day or >5lbs in a week  - continue lasix  80mg  BID - BMET 03/22/24 reviewed: sodium: 135, potassium: 3.9, creatnine: 0.48, and GFR >60.  - BNP 03/19/24 was 54 - consider starting MRA following BMET today and BP monitoring  - A1C elevated therefore SGLT2 held at this time  - BMET today   2. T2DM - history of uncontrolled DM with hyperglycemia  - continue insulin  and close glucose monitoring   - Reinforced medication adherence and dietary modifications  -  A1C 14.3 on 01/24/24, recheck in 3 months   3. Substance abuse  - has begun detoxying ~ 1 month ago  - currently on suboxone  - smokes on average 1ppd , smoking cessation encouraged and discussed nicotine  patches  - No alcohol or substance use in 4 months   4. HLD - continue atorvastatin  40mg  daily  - LDL 165 on 9/20  5. Muscle spasms  -  magnesium  labs today   6. Sleep apnea  - will follow up at next visit   7. Sinus Tachycardia  - will monitor, asymptomatic today.     Ellouise Class, FNP/Khallid Pasillas, FNP-S  04/10/24

## 2024-04-09 NOTE — Telephone Encounter (Signed)
 Called to confirm/remind patient of their appointment at the [facility] Clinic on 04/10/24.   Appointment:   [] Confirmed  [] Left mess   [x] No answer/No voice mail  [] VM Full/unable to leave message  [] Phone not in service  Patient reminded to bring all medications and/or complete list.  Confirmed patient has transportation. Gave directions, instructed to utilize valet parking.

## 2024-04-10 ENCOUNTER — Other Ambulatory Visit
Admission: RE | Admit: 2024-04-10 | Discharge: 2024-04-10 | Disposition: A | Discharge status: Home / Self Care | Source: Ambulatory Visit | Attending: Family | Admitting: Family

## 2024-04-10 ENCOUNTER — Ambulatory Visit: Admitting: Family

## 2024-04-10 ENCOUNTER — Ambulatory Visit: Payer: Self-pay | Admitting: Family

## 2024-04-10 ENCOUNTER — Encounter: Payer: Self-pay | Admitting: Family

## 2024-04-10 VITALS — BP 105/75 | HR 111 | Ht 68.5 in | Wt 151.2 lb

## 2024-04-10 DIAGNOSIS — I5032 Chronic diastolic (congestive) heart failure: Secondary | ICD-10-CM

## 2024-04-10 DIAGNOSIS — R000 Tachycardia, unspecified: Secondary | ICD-10-CM | POA: Diagnosis not present

## 2024-04-10 DIAGNOSIS — E782 Mixed hyperlipidemia: Secondary | ICD-10-CM

## 2024-04-10 DIAGNOSIS — E1165 Type 2 diabetes mellitus with hyperglycemia: Secondary | ICD-10-CM

## 2024-04-10 DIAGNOSIS — G4730 Sleep apnea, unspecified: Secondary | ICD-10-CM

## 2024-04-10 DIAGNOSIS — R252 Cramp and spasm: Secondary | ICD-10-CM | POA: Insufficient documentation

## 2024-04-10 DIAGNOSIS — F1910 Other psychoactive substance abuse, uncomplicated: Secondary | ICD-10-CM

## 2024-04-10 LAB — BASIC METABOLIC PANEL WITH GFR
Anion gap: 12 (ref 5–15)
BUN: 24 mg/dL — ABNORMAL HIGH (ref 6–20)
CO2: 27 mmol/L (ref 22–32)
Calcium: 9.9 mg/dL (ref 8.9–10.3)
Chloride: 99 mmol/L (ref 98–111)
Creatinine, Ser: 0.48 mg/dL (ref 0.44–1.00)
GFR, Estimated: >60 mL/min (ref >60)
Glucose, Bld: 90 mg/dL (ref 70–99)
Potassium: 3.6 mmol/L (ref 3.5–5.1)
Sodium: 138 mmol/L (ref 135–145)

## 2024-04-10 LAB — MAGNESIUM: Magnesium: 1.6 mg/dL — ABNORMAL LOW (ref 1.7–2.4)

## 2024-04-10 NOTE — Patient Instructions (Addendum)
 It was nice to meet you today!  Medication Changes:  No medication changes today!  Lab Work:  Go over to the [facility]. Go pass the gift shop and have your blood work completed.  We will only call you if the results are abnormal or if the provider would like to make medication changes.  No news is good news.   Follow-Up in: Please follow up with the Advanced [facility] Clinic in 3 weeks with Ellouise Class, FNP.   Thank you for choosing [facility].    At the Advanced Heart Failure Clinic, you and your health needs are our priority. We have a designated team specialized in the treatment of Heart Failure. This Care Team includes your primary Heart Failure Specialized Cardiologist (physician), Advanced Practice Providers (APPs- Physician Assistants and Nurse Practitioners), and Pharmacist who all work together to provide you with the care you need, when you need it.   You may see any of the following providers on your designated Care Team at your next follow up:  Dr. Toribio Fuel Dr. Ezra Shuck Dr. Ria Commander Dr. Morene Brownie Ellouise Class, FNP Jaun Bash, RPH-CPP  Please be sure to bring in all your medications bottles to every appointment.   Need to Contact Us :  If you have any questions or concerns before your next appointment please send us  a message through [place] or call our office at [phone].    TO LEAVE A MESSAGE FOR THE NURSE SELECT OPTION 2, PLEASE LEAVE A MESSAGE INCLUDING: YOUR NAME DATE OF BIRTH CALL BACK NUMBER REASON FOR CALL**this is important as we prioritize the call backs  YOU WILL RECEIVE A CALL BACK THE SAME DAY AS LONG AS YOU CALL BEFORE 4:00 PM

## 2024-04-30 ENCOUNTER — Telehealth: Payer: Self-pay | Admitting: Family

## 2024-04-30 NOTE — Telephone Encounter (Signed)
 Called to confirm/remind patient of their appointment at the Advanced [facility] Failure Clinic on 05/01/24.   Appointment:   [x] Confirmed  [] Left mess   [] No answer/No voice mail  [] VM Full/unable to leave message  [] Phone not in service  Patient reminded to bring all medications and/or complete list.  Confirmed patient has transportation. Gave directions, instructed to utilize valet parking.

## 2024-04-30 NOTE — Progress Notes (Unsigned)
 [facility] Note   Referring Physician: PCP: Earvin Johnston PARAS, FNP PCP-Cardiologist: None   Chief Complaint: Occasional PND and orthopnea   HPI: [name] is a 58 yo female with a history of alcohol use, substance abuse, hepatic steatosis, uncontrolled T2DM, DKA,OA, HLD, asthma, depression, anxiety, and cholelithiasis.   ED visit 10/25/23 for left sided chest pain and pressure. Labs showed blood glucose of 604, anion gap of 14, hydroxybutyrate acid of 3.8, and ketones urine. Diagnosed with DKA. Troponin and CXR negative. EKG NSR. Given IV insulin  and discharged.   Echo 10/26/23 EF: 55-60%   ED visit 01/04/24 and 01/24/24 for falls.   Echo 02/11/24 EF: 60-65% with MVD, trivial MR, and AVR. Mobile calcified material with non-coronary cusp.   ED visit 03/18/24 with presentation of hypoxemia O2 of 84%, RA and hypogylcemia with blood sugar of 33. Diagnosed with acute respiratory failure with hypoxia. CT Chest showed bilateral pleural effusions and moderate pulmonary edema, with no PE. Abdominal US  showed large gallstone without cholelithiasis. Given IV and oral antibiotics.    She presents today in the [facility] for an initial TOC visit with a chief complaint of occasional PND and orthopnea. Has associated intermittent fatigue, dizziness, and palpitations. Believes she has sleep apnea and needs a sleep study. Denies chest pain, cough, abdominal distention, and edema. Overall, has had no worsening symptoms since discharge.   Currently resides in nursing rehab facility in [place]. Medications are managed by facility.   She is adding salt to meals and has limited in options due to meals being served a facility. She is weighing consistently. Weight has ranged from 150.2 - 154.4lbs over the last 2 weeks. BP and Glucose is stable is monitored daily at facility.   Does have pain in hips and knees due to OA. Plans to have hip surgery wants A1C <7. Further reports muscle spasms with no specific  origin.   Has been on suboxone for ~ 3 weeks. No alcohol or drug use in the last 4 months. Smokes on average 1ppd when affordable.   Review of Systems: [y] = yes, [ ]  = no   General: Weight gain [ ] ; Weight loss [ ] ; Anorexia [ ] ; Fatigue [ ] ; Fever [ ] ; Chills [ ] ; Weakness [X]   Cardiac: Chest pain/pressure [ ] ; Resting SOB [ ] ; Exertional SOB [ ] ; Orthopnea GALERIUS.GANT ]; Pedal Edema [ ] ; Palpitations [ ] ; Syncope [ ] ; Presyncope [ ] ; Paroxysmal nocturnal dyspnea[X]   Pulmonary: Cough [ ] ; Wheezing[ ] ; Hemoptysis[ ] ; Sputum [ ] ; Snoring [ ]   GI: Vomiting[ ] ; Dysphagia[ ] ; Melena[ ] ; Hematochezia [ ] ; Heartburn[ ] ; Abdominal pain [ ] ; Constipation [ ] ; Diarrhea [ ] ; BRBPR [ ]   GU: Hematuria[ ] ; Dysuria [ ] ; Nocturia[ ]   Vascular: Pain in legs with walking [] ; Pain in feet with lying flat [ ] ; Non-healing sores [ ] ; Stroke [ ] ; TIA [ ] ; Slurred speech [ ] ;  Neuro: Headaches[ ] ; Vertigo[ ] ; Seizures[ ] ; Paresthesias[ ] ;Blurred vision [ ] ; Diplopia [ ] ; Vision changes [ ]   Ortho/Skin: Arthritis [X] ; Joint pain [ ] ; Muscle pain [X] ; Joint swelling [ ] ; Back Pain [ ] ; Rash [ ]   Psych: Depression[ X]; Anxiety[X ]  Heme: Bleeding problems [ ] ; Clotting disorders [ ] ; Anemia [ ]   Endocrine: Diabetes [X] ; Thyroid  dysfunction[ ]    Past Medical History:  Diagnosis Date   Abnormal Pap smear    Bronchitis    Depression    Diabetes (HCC)  GERD (gastroesophageal reflux disease)    tums prn   Headache(784.0)    Polysubstance abuse (HCC)     Current Outpatient Medications  Medication Sig Dispense Refill   Accu-Chek Softclix Lancets lancets Use as instructed 100 each 12   acetaminophen  (TYLENOL ) 500 MG tablet Take 2 tablets (1,000 mg total) by mouth every 6 (six) hours as needed.     albuterol  (VENTOLIN  HFA) 108 (90 Base) MCG/ACT inhaler Inhale 1-2 puffs into the lungs every 4 (four) hours as needed for wheezing or shortness of breath. 18 g 2   amitriptyline (ELAVIL) 75 MG tablet Take 75 mg by mouth at  bedtime.     atorvastatin  (LIPITOR) 40 MG tablet Take 1 tablet (40 mg total) by mouth daily. 90 tablet 3   Buprenorphine HCl 150 MCG FILM Place 1 Film inside cheek 2 (two) times daily.     cefdinir (OMNICEF) 300 MG capsule Take 1 capsule (300 mg total) by mouth 2 (two) times daily. X 2 days     DULoxetine  (CYMBALTA ) 60 MG capsule Take 1 capsule (60 mg total) by mouth daily.     fluconazole  (DIFLUCAN ) 150 MG tablet Take 150 mg by mouth every 3 (three) days.     fluticasone (FLONASE) 50 MCG/ACT nasal spray Place 1 spray into both nostrils daily.     furosemide  (LASIX ) 40 MG tablet Take 80 mg by mouth 2 (two) times daily.     gabapentin  (NEURONTIN ) 800 MG tablet Take 800 mg by mouth 4 (four) times daily.     glimepiride  (AMARYL ) 2 MG tablet Take 2 mg by mouth daily.     Glucose Blood (BLOOD GLUCOSE TEST STRIPS) STRP Use to check blood sugar 4 (four) times daily -  before meals and at bedtime. 200 strip 3   hydrOXYzine  (ATARAX ) 25 MG tablet Take 1 tablet (25 mg total) by mouth 3 (three) times daily as needed for anxiety.     insulin  aspart (NOVOLOG ) 100 UNIT/ML injection Inject 0-15 Units into the skin 3 (three) times daily with meals. (Patient taking differently: Inject 0-5 Units into the skin 3 (three) times daily with meals. Inject as per sliding scale: if 70-150=0,151-200=1.201-250=2,251-300=3,301-350=4,351-400=5 greater than 401 call md, subcutaneously with meals)     insulin  glargine (LANTUS ) 100 UNIT/ML injection Inject 0.4 mLs (40 Units total) into the skin 2 (two) times daily. (Patient taking differently: Inject 42 Units into the skin 2 (two) times daily.)     Insulin  Pen Needle (PEN NEEDLES) 31G X 5 MM MISC Use with insulin  4 (four) times daily -  before meals and at bedtime. 100 each 3   lactulose, encephalopathy, (ENULOSE) 10 GM/15ML SOLN Take 20 g by mouth daily.     lamoTRIgine (LAMICTAL) 25 MG tablet Take 1 tablet (25 mg total) by mouth 2 (two) times daily. X one week, then increase to 50  mg bid (Patient taking differently: Take 50 mg by mouth 2 (two) times daily. X one week, then increase to 50 mg bid)     lidocaine  (LIDODERM ) 5 % Place 1 patch onto the skin daily. Remove & Discard patch within 12 hours or as directed by MD     loratadine (CLARITIN) 10 MG tablet Take 10 mg by mouth daily.     melatonin 3 MG TABS tablet Take 3 mg by mouth at bedtime.     methocarbamol  (ROBAXIN ) 750 MG tablet Take 1 tablet (750 mg total) by mouth every 6 (six) hours as needed for muscle spasms.  Multiple Vitamin (MULTIVITAMIN WITH MINERALS) TABS tablet Take 1 tablet by mouth daily.     naltrexone (DEPADE) 50 MG tablet Take 50 mg by mouth daily.     nicotine  (NICODERM CQ  - DOSED IN MG/24 HOURS) 14 mg/24hr patch Place 1 patch (14 mg total) onto the skin daily.     polyethylene glycol powder (GLYCOLAX ) 17 GM/SCOOP powder Take 17 g by mouth daily.     saccharomyces boulardii (FLORASTOR) 250 MG capsule Take 250 mg by mouth 2 (two) times daily.     senna (SENOKOT) 8.6 MG tablet Take 2 tablets by mouth 2 (two) times daily.     thiamine  (VITAMIN B-1) 100 MG tablet Take 1 tablet (100 mg total) by mouth daily.     vancomycin (VANCOCIN) 125 MG capsule Take 1 capsule (125 mg total) by mouth 4 (four) times daily. X 10 days     No current facility-administered medications for this visit.    Allergies  Allergen Reactions   Glucophage  [Metformin ] Diarrhea      Social History   Socioeconomic History   Marital status: Single    Spouse name: Not on file   Number of children: Not on file   Years of education: Not on file   Highest education level: 9th grade  Occupational History   Not on file  Tobacco Use   Smoking status: Every Day    Current packs/day: 1.00    Average packs/day: 1 pack/day for 20.0 years (20.0 ttl pk-yrs)    Types: Cigarettes   Smokeless tobacco: Never  Substance and Sexual Activity   Alcohol use: Not Currently    Alcohol/week: 7.0 standard drinks of alcohol    Types: 7 Cans  of beer per week    Comment: couple of beers a week prior to SNF   Drug use: Not Currently    Types: Crack cocaine, Marijuana    Comment: Pills(Vicodin) heroin (states no today 09/04/15)   Sexual activity: Yes    Birth control/protection: None    Comment: Pt in rehab for drug and alcohol use  Other Topics Concern   Not on file  Social History Narrative   Not on file   Social Drivers of Health   Financial Resource Strain: Not on File (10/08/2021)   Received from [employer]    Financial Resource Strain: 0  Food Insecurity: No Food Insecurity (03/20/2024)   Hunger Vital Sign    Worried About Running Out of Food in the Last Year: Never true    Ran Out of Food in the Last Year: Never true  Recent Concern: Food Insecurity - Food Insecurity Present (01/24/2024)   Hunger Vital Sign    Worried About Running Out of Food in the Last Year: Often true    Ran Out of Food in the Last Year: Often true  Transportation Needs: Unmet Transportation Needs (03/18/2024)   PRAPARE - Administrator, Civil Service (Medical): Yes    Lack of Transportation (Non-Medical): Yes  Physical Activity: Not on File (10/08/2021)   [facility]   Physical Activity    Physical Activity: 0  Stress: Not on File (10/08/2021)   Received from [facility]   Stress    Stress: 0  Social Connections: Socially Isolated (03/18/2024)   Social Connection and Isolation Panel    Frequency of Communication with Friends and Family: Once a week    Frequency of Social Gatherings with Friends and Family: Never    Attends Religious  Services: Never    Active Member of Clubs or Organizations: No    Attends Banker Meetings: Never    Marital Status: Separated  Intimate Partner Violence: Not At Risk (03/18/2024)   Humiliation, Afraid, Rape, and Kick questionnaire    Fear of Current or Ex-Partner: No    Emotionally Abused: No    Physically Abused: No    Sexually Abused: No      Family  History  Problem Relation Age of Onset   Cancer Mother        unsure    There were no vitals filed for this visit. There were no vitals filed for this visit.  Wt Readings from Last 3 Encounters:  04/10/24 151 lb 3.2 oz (68.6 kg)  03/22/24 154 lb 5.2 oz (70 kg)  02/15/24 151 lb 0.2 oz (68.5 kg)    ECG: Sinus Tachycardia Personally reviewed   PHYSICAL EXAM: General: Well appearing female in wheelchair. No acute signs of distress.  Cor: No JVD. Regular rhythm, rate.  Lungs: Clear bilaterally. Symmetrical chest expansion.  Abdomen: Soft, nontender, nondistended. Extremities: No edema, cyanosis, or clubbing. Limited mobility due to OA.  Neuro:. AO x 3. Affect pleasant   ECG: Sinus Tachycardia Personally reviewed.   ASSESSMENT & PLAN:  (HFpEF) Acute heart failure with preserved ejection fraction - suspect due to alcohol and substance abuse? - NYHA II - euvolemic  - Echo 02/11/24 EF: 60-65% with MVD, trivial MR, and AVR. Mobile calcified material with non-coronary cusp.  - continue weighing daily - weight stable over the last 2 weeks per self reported tracking  - maintain fluid restriction 60-64 oz and sodium restriction 2000mg   - report weight gain of 2-3lbs/day or >5lbs in a week  - continue lasix  80mg  BID - BMET 03/22/24 reviewed: sodium: 135, potassium: 3.9, creatnine: 0.48, and GFR >60.  - BNP 03/19/24 was 54 - consider starting MRA following BMET today and BP monitoring  - A1C elevated therefore SGLT2 held at this time  - BMET today   2. T2DM - history of uncontrolled DM with hyperglycemia  - continue insulin  and close glucose monitoring   - Reinforced medication adherence and dietary modifications  -  A1C 14.3 on 01/24/24, recheck in 3 months   3. Substance abuse  - has begun detoxying ~ 1 month ago  - currently on suboxone  - smokes on average 1ppd , smoking cessation encouraged and discussed nicotine  patches  - No alcohol or substance use in 4 months   4.  HLD - continue atorvastatin  40mg  daily  - LDL 165 on 9/20  5. Muscle spasms  - magnesium  labs today   6. Sleep apnea  - will follow up at next visit   7. Sinus Tachycardia  - will monitor, asymptomatic today.     Ellouise Class, FNP/Chiquasia Morris, FNP-S  04/10/24

## 2024-05-01 ENCOUNTER — Telehealth: Payer: Self-pay | Admitting: Family

## 2024-05-01 ENCOUNTER — Encounter: Payer: Self-pay | Admitting: Family

## 2024-05-01 ENCOUNTER — Ambulatory Visit: Attending: Family | Admitting: Family

## 2024-05-01 VITALS — BP 124/76 | HR 103 | Wt 152.0 lb

## 2024-05-01 DIAGNOSIS — M160 Bilateral primary osteoarthritis of hip: Secondary | ICD-10-CM | POA: Insufficient documentation

## 2024-05-01 DIAGNOSIS — F17210 Nicotine dependence, cigarettes, uncomplicated: Secondary | ICD-10-CM | POA: Diagnosis not present

## 2024-05-01 DIAGNOSIS — E785 Hyperlipidemia, unspecified: Secondary | ICD-10-CM | POA: Insufficient documentation

## 2024-05-01 DIAGNOSIS — Z79899 Other long term (current) drug therapy: Secondary | ICD-10-CM | POA: Insufficient documentation

## 2024-05-01 DIAGNOSIS — Z794 Long term (current) use of insulin: Secondary | ICD-10-CM | POA: Insufficient documentation

## 2024-05-01 DIAGNOSIS — G4730 Sleep apnea, unspecified: Secondary | ICD-10-CM | POA: Diagnosis not present

## 2024-05-01 DIAGNOSIS — Z7984 Long term (current) use of oral hypoglycemic drugs: Secondary | ICD-10-CM | POA: Diagnosis not present

## 2024-05-01 DIAGNOSIS — I5030 Unspecified diastolic (congestive) heart failure: Secondary | ICD-10-CM | POA: Insufficient documentation

## 2024-05-01 DIAGNOSIS — M170 Bilateral primary osteoarthritis of knee: Secondary | ICD-10-CM | POA: Diagnosis not present

## 2024-05-01 DIAGNOSIS — Z5982 Transportation insecurity: Secondary | ICD-10-CM | POA: Insufficient documentation

## 2024-05-01 DIAGNOSIS — I5032 Chronic diastolic (congestive) heart failure: Secondary | ICD-10-CM | POA: Diagnosis not present

## 2024-05-01 DIAGNOSIS — E782 Mixed hyperlipidemia: Secondary | ICD-10-CM | POA: Diagnosis not present

## 2024-05-01 DIAGNOSIS — F1910 Other psychoactive substance abuse, uncomplicated: Secondary | ICD-10-CM | POA: Diagnosis not present

## 2024-05-01 DIAGNOSIS — K760 Fatty (change of) liver, not elsewhere classified: Secondary | ICD-10-CM | POA: Diagnosis not present

## 2024-05-01 DIAGNOSIS — J45909 Unspecified asthma, uncomplicated: Secondary | ICD-10-CM | POA: Insufficient documentation

## 2024-05-01 DIAGNOSIS — M62838 Other muscle spasm: Secondary | ICD-10-CM | POA: Insufficient documentation

## 2024-05-01 DIAGNOSIS — R252 Cramp and spasm: Secondary | ICD-10-CM

## 2024-05-01 DIAGNOSIS — E1165 Type 2 diabetes mellitus with hyperglycemia: Secondary | ICD-10-CM | POA: Insufficient documentation

## 2024-05-01 MED ORDER — SPIRONOLACTONE 25 MG PO TABS: 25.0000 mg | ORAL_TABLET | Freq: Every day | ORAL | 3 refills | Status: AC

## 2024-05-01 MED ORDER — FUROSEMIDE 40 MG PO TABS: 80.0000 mg | ORAL_TABLET | Freq: Every day | ORAL | 2 refills | Status: DC

## 2024-05-01 MED ORDER — MAGNESIUM 400 MG PO CAPS: 400.0000 mg | ORAL_CAPSULE | Freq: Every day | ORAL | 3 refills | Status: DC

## 2024-05-01 NOTE — Patient Instructions (Signed)
 Medication Changes:  START Magnesium  400mg  daily  START Spironolactone 25mg  daily  DECREASE Furosemide  to 80mg  daily  Lab Work:  Please have complete metabolic panel on 05/11/24 and fax results to [phone].   Follow-Up in: Please follow up with the [facility] Clinic in 1 month with Ellouise Class, FNP.   Thank you for choosing [facility].    At the [facility] Clinic, you and your health needs are our priority. We have a designated team specialized in the treatment of Heart Failure. This Care Team includes your primary Heart Failure Specialized Cardiologist (physician), Advanced Practice Providers (APPs- Physician Assistants and Nurse Practitioners), and Pharmacist who all work together to provide you with the care you need, when you need it.   You may see any of the following providers on your designated Care Team at your next follow up:  Dr. Toribio Fuel Dr. Ezra Shuck Dr. Ria Commander Dr. Morene Brownie Ellouise Class, FNP Jaun Bash, RPH-CPP  Please be sure to bring in all your medications bottles to every appointment.   Need to Contact Us :  If you have any questions or concerns before your next appointment please send us  a message through [place] or call our office at [phone].    TO LEAVE A MESSAGE FOR THE NURSE SELECT OPTION 2, PLEASE LEAVE A MESSAGE INCLUDING: YOUR NAME DATE OF BIRTH CALL BACK NUMBER REASON FOR CALL**this is important as we prioritize the call backs  YOU WILL RECEIVE A CALL BACK THE SAME DAY AS LONG AS YOU CALL BEFORE 4:00 PM

## 2024-05-01 NOTE — Telephone Encounter (Signed)
 Labs received from SNF dated 03/27/24:   Hg 12.3 WBC 9.4 A1c 8.5%

## 2024-05-11 LAB — COMPREHENSIVE METABOLIC PANEL WITH GFR

## 2024-05-22 ENCOUNTER — Encounter: Payer: Self-pay | Admitting: Gastroenterology

## 2024-05-23 ENCOUNTER — Telehealth: Payer: Self-pay | Admitting: Family

## 2024-05-23 NOTE — Telephone Encounter (Signed)
 Labs from SNF dated 05/11/24 notable for: (full copy scanned into medical record)  Sodium 139 Potassium 3.7 Creatinine 0.64 eGFR >90

## 2024-06-04 ENCOUNTER — Telehealth: Payer: Self-pay | Admitting: Family

## 2024-06-04 NOTE — Progress Notes (Unsigned)
 [facility] Note   Referring Physician: PCP: Earvin Johnston PARAS, FNP Cardiologist: None   Chief Complaint: shortness of breath   HPI:  Ms. Carvell is a 58 yo female with a history of alcohol use, substance abuse, hepatic steatosis, uncontrolled T2DM, DKA,OA, HLD, asthma, depression, anxiety, and cholelithiasis.   ED visit 10/25/23 for left sided chest pain and pressure. Labs showed blood glucose of 604, anion gap of 14, hydroxybutyrate acid of 3.8, and ketones urine. Diagnosed with DKA. Troponin and CXR negative. EKG NSR. Given IV insulin  and discharged.   Echo 10/26/23 EF: 55-60%   ED visit 01/04/24 and 01/24/24 for falls.   Echo 02/11/24 EF: 60-65% with MVD, trivial MR, and AVR. Mobile calcified material with non-coronary cusp.   ED visit 03/18/24 with presentation of hypoxemia O2 of 84%, RA and hypogylcemia with blood sugar of 33. Diagnosed with acute respiratory failure with hypoxia. CT Chest showed bilateral pleural effusions and moderate pulmonary edema, with no PE. Abdominal US  showed large gallstone without cholelithiasis. Given IV and oral antibiotics.    She presents today, with a transportation person, for a HF follow-up visit with a chief complaint of minimal shortness of breath with exertion. Has associated fatigue, occasional palpitations, muscle spasms, occasional burning sensation in legs/ fingers. Denies numbness/ tingling in extremities and says that her recent A1c is down to 8.5%. Does have pain in hips and knees due to OA. Plans to have hip surgery wants A1C <7.    Using less salt than she was previously and says that she can now taste the salt in some foods. Currently resides in nursing rehab facility in [place]. Medications are managed by facility.   Remains on naltrexone.   ROS: All systems negative except what is listed in HPI, PMH and Problem List   Past Medical History:  Diagnosis Date   Abnormal Pap smear    Bronchitis    Depression     Diabetes (HCC)    GERD (gastroesophageal reflux disease)    tums prn   Headache(784.0)    Polysubstance abuse (HCC)     Current Outpatient Medications  Medication Sig Dispense Refill   Accu-Chek Softclix Lancets lancets Use as instructed 100 each 12   acetaminophen  (TYLENOL ) 500 MG tablet Take 2 tablets (1,000 mg total) by mouth every 6 (six) hours as needed.     albuterol  (VENTOLIN  HFA) 108 (90 Base) MCG/ACT inhaler Inhale 1-2 puffs into the lungs every 4 (four) hours as needed for wheezing or shortness of breath. 18 g 2   amitriptyline (ELAVIL) 75 MG tablet Take 75 mg by mouth at bedtime.     atorvastatin  (LIPITOR) 40 MG tablet Take 1 tablet (40 mg total) by mouth daily. 90 tablet 3   Buprenorphine HCl 150 MCG FILM Place 1 Film inside cheek 2 (two) times daily. (Patient not taking: Reported on 05/01/2024)     cefdinir  (OMNICEF ) 300 MG capsule Take 1 capsule (300 mg total) by mouth 2 (two) times daily. X 2 days (Patient not taking: Reported on 05/01/2024)     DULoxetine  (CYMBALTA ) 60 MG capsule Take 1 capsule (60 mg total) by mouth daily.     fluconazole  (DIFLUCAN ) 150 MG tablet Take 150 mg by mouth every 3 (three) days.     fluticasone (FLONASE) 50 MCG/ACT nasal spray Place 1 spray into both nostrils daily.     furosemide  (LASIX ) 40 MG tablet Take 2 tablets (80 mg total) by mouth daily. 90 tablet 2   gabapentin  (NEURONTIN )  800 MG tablet Take 800 mg by mouth 4 (four) times daily.     glimepiride  (AMARYL ) 2 MG tablet Take 2 mg by mouth daily.     Glucose Blood (BLOOD GLUCOSE TEST STRIPS) STRP Use to check blood sugar 4 (four) times daily -  before meals and at bedtime. 200 strip 3   hydrOXYzine  (ATARAX ) 25 MG tablet Take 1 tablet (25 mg total) by mouth 3 (three) times daily as needed for anxiety.     insulin  aspart (NOVOLOG ) 100 UNIT/ML injection Inject 0-15 Units into the skin 3 (three) times daily with meals.     insulin  glargine (LANTUS ) 100 UNIT/ML injection Inject 0.4 mLs (40 Units  total) into the skin 2 (two) times daily.     Insulin  Pen Needle (PEN NEEDLES) 31G X 5 MM MISC Use with insulin  4 (four) times daily -  before meals and at bedtime. 100 each 3   lactulose, encephalopathy, (ENULOSE) 10 GM/15ML SOLN Take 20 g by mouth daily.     lamoTRIgine  (LAMICTAL ) 25 MG tablet Take 1 tablet (25 mg total) by mouth 2 (two) times daily. X one week, then increase to 50 mg bid     lidocaine  (LIDODERM ) 5 % Place 1 patch onto the skin daily. Remove & Discard patch within 12 hours or as directed by MD     loratadine (CLARITIN) 10 MG tablet Take 10 mg by mouth daily.     Magnesium  400 MG CAPS Take 400 mg by mouth daily. 90 capsule 3   melatonin 3 MG TABS tablet Take 3 mg by mouth at bedtime.     methocarbamol  (ROBAXIN ) 750 MG tablet Take 1 tablet (750 mg total) by mouth every 6 (six) hours as needed for muscle spasms.     Multiple Vitamin (MULTIVITAMIN WITH MINERALS) TABS tablet Take 1 tablet by mouth daily.     naltrexone (DEPADE) 50 MG tablet Take 50 mg by mouth daily.     nicotine  (NICODERM CQ  - DOSED IN MG/24 HOURS) 14 mg/24hr patch Place 1 patch (14 mg total) onto the skin daily.     polyethylene glycol powder (GLYCOLAX ) 17 GM/SCOOP powder Take 17 g by mouth daily.     saccharomyces boulardii (FLORASTOR) 250 MG capsule Take 250 mg by mouth 2 (two) times daily.     senna (SENOKOT) 8.6 MG tablet Take 2 tablets by mouth 2 (two) times daily.     spironolactone  (ALDACTONE ) 25 MG tablet Take 1 tablet (25 mg total) by mouth daily. 90 tablet 3   thiamine  (VITAMIN B-1) 100 MG tablet Take 1 tablet (100 mg total) by mouth daily.     vancomycin  (VANCOCIN ) 125 MG capsule Take 1 capsule (125 mg total) by mouth 4 (four) times daily. X 10 days (Patient not taking: Reported on 05/01/2024)     No current facility-administered medications for this visit.    Allergies  Allergen Reactions   Glucophage  [Metformin ] Diarrhea      Social History   Socioeconomic History   Marital status: Single     Spouse name: Not on file   Number of children: Not on file   Years of education: Not on file   Highest education level: 9th grade  Occupational History   Not on file  Tobacco Use   Smoking status: Every Day    Current packs/day: 1.00    Average packs/day: 1 pack/day for 20.0 years (20.0 ttl pk-yrs)    Types: Cigarettes   Smokeless tobacco: Never  Substance and Sexual  Activity   Alcohol use: Not Currently    Alcohol/week: 7.0 standard drinks of alcohol    Types: 7 Cans of beer per week    Comment: couple of beers a week prior to SNF   Drug use: Not Currently    Types: Crack cocaine, Marijuana    Comment: Pills(Vicodin) heroin (states no today 09/04/15)   Sexual activity: Yes    Birth control/protection: None    Comment: Pt in rehab for drug and alcohol use  Other Topics Concern   Not on file  Social History Narrative   Not on file   Social Drivers of Health   Tobacco Use: High Risk (05/01/2024)   Patient History    Smoking Tobacco Use: Every Day    Smokeless Tobacco Use: Never    Passive Exposure: Not on file  Financial Resource Strain: Not on File (10/08/2021)   Received from [employer]    Financial Resource Strain: 0  Food Insecurity: No Food Insecurity (03/20/2024)   Epic    Worried About Programme Researcher, Broadcasting/film/video in the Last Year: Never true    [employer] of Food in the Last Year: Never true  Recent Concern: Food Insecurity - Food Insecurity Present (01/24/2024)   Epic    Worried About Programme Researcher, Broadcasting/film/video in the Last Year: Often true    Ran Out of Food in the Last Year: Often true  Transportation Needs: Unmet Transportation Needs (03/18/2024)   Epic    Lack of Transportation (Medical): Yes    Lack of Transportation (Non-Medical): Yes  Physical Activity: Not on File (10/08/2021)   Received from [facility]   Physical Activity    Physical Activity: 0  Stress: Not on File (10/08/2021)   Received from [facility]   Stress    Stress: 0  Social Connections:  Socially Isolated (03/18/2024)   Social Connection and Isolation Panel    Frequency of Communication with Friends and Family: Once a week    Frequency of Social Gatherings with Friends and Family: Never    Attends Religious Services: Never    Database Administrator or Organizations: No    Attends Banker Meetings: Never    Marital Status: Separated  Intimate Partner Violence: Not At Risk (03/18/2024)   Epic    Fear of Current or Ex-Partner: No    Emotionally Abused: No    Physically Abused: No    Sexually Abused: No  Depression (PHQ2-9): Not on file  Alcohol Screen: Not on file  Housing: Unknown (03/20/2024)   Epic    Unable to Pay for Housing in the Last Year: No    Number of Times Moved in the Last Year: Not on file    Homeless in the Last Year: No  Recent Concern: Housing - High Risk (03/18/2024)   Epic    Unable to Pay for Housing in the Last Year: Yes    Number of Times Moved in the Last Year: 1    Homeless in the Last Year: Yes  Utilities: At Risk (03/18/2024)   Epic    Threatened with loss of utilities: Yes  Health Literacy: Not on file      Family History  Problem Relation Age of Onset   Cancer Mother        unsure   There were no vitals filed for this visit.  Wt Readings from Last 3 Encounters:  05/01/24 152 lb (68.9 kg)  04/10/24 151 lb 3.2 oz (68.6  kg)  03/22/24 154 lb 5.2 oz (70 kg)   Lab Results  Component Value Date   CREATININE 0.48 04/10/2024   CREATININE 0.48 03/22/2024   CREATININE 0.47 03/21/2024    PHYSICAL EXAM:  General: Well appearing in wheelchair, appears older than stated age.   Cor: No JVD. Regular rhythm, tachycardic.  Lungs: clear Abdomen: soft, nontender, nondistended. Extremities: no edema Neuro:. Affect pleasant   ECG: Not done   ASSESSMENT & PLAN:  1: HFpEF- - suspect due to alcohol and substance abuse? - NYHA II - euvolemic  - Echo 02/11/24 EF: 60-65% with MVD, trivial MR, and AVR. Mobile calcified  material with non-coronary cusp.  - weight stable from last visit here 3 weeks ago - maintain fluid restriction 60-64 oz and sodium restriction 2000mg   - begin spironolactone  25mg  daily. Order written for facility to do [facility] 05/11/24 and fax results to our office. BMET at next OV as well.  - decrease furosemide  to 80mg  daily with starting spiro - defer SGLT2 as A1c is >10%. Will get results faxed from facility to confirm decline in A1c.  - BMET 04/10/24 reviewed: sodium: 138, potassium: 3.6 creatnine: 0.48, and GFR >60.  - BNP 03/19/24 was 54 - BP 124/76  2. T2DM - history of uncontrolled DM with hyperglycemia  - continue insulin  and close glucose monitoring   - Reinforced medication adherence and dietary modifications  -  A1C 14.3% on 01/24/24. Patient says that her A1c is now down to <9% but will get results faxed from facility for confirmation  3. Substance abuse  - has begun detoxying ~ 1 month ago  - currently on naltrexone   - smokes on average 1ppd , smoking cessation encouraged and discussed nicotine  patches  - No alcohol or substance use in 5 months   4. HLD - continue atorvastatin  40mg  daily  - LDL 165 on 9/20 - will check lipids at next OV  5. Muscle spasms  - Mg 04/10/24 was 1.6. Magnesium  was to be started but staff could never reach anyone at SNF - begin magnesium  400mg  daily.   6. Sleep apnea  - will follow up at next visit    Return in 1 month, sooner if needed.   I spent 38 minutes reviewing records, interviewing/ examing patient and managing plan/ orders.   Ellouise DELENA Class [facility] 06/04/2024

## 2024-06-04 NOTE — Telephone Encounter (Signed)
 Called to confirm/remind patient of their appointment at the [facility] on 06/05/24.   Appointment:   [] Confirmed  [] Left mess   [x] No answer/No voice mail  [] VM Full/unable to leave message  [] Phone not in service  Patient reminded to bring all medications and/or complete list.  Confirmed patient has transportation. Gave directions, instructed to utilize valet parking.

## 2024-06-05 ENCOUNTER — Encounter: Payer: Self-pay | Admitting: Family

## 2024-06-05 ENCOUNTER — Ambulatory Visit: Attending: Family | Admitting: Family

## 2024-06-05 ENCOUNTER — Other Ambulatory Visit: Payer: Self-pay

## 2024-06-05 VITALS — BP 128/81 | HR 104 | Wt 163.0 lb

## 2024-06-05 DIAGNOSIS — I5032 Chronic diastolic (congestive) heart failure: Secondary | ICD-10-CM | POA: Insufficient documentation

## 2024-06-05 DIAGNOSIS — E1165 Type 2 diabetes mellitus with hyperglycemia: Secondary | ICD-10-CM | POA: Insufficient documentation

## 2024-06-05 DIAGNOSIS — M62838 Other muscle spasm: Secondary | ICD-10-CM

## 2024-06-05 DIAGNOSIS — J45909 Unspecified asthma, uncomplicated: Secondary | ICD-10-CM | POA: Insufficient documentation

## 2024-06-05 DIAGNOSIS — Z7984 Long term (current) use of oral hypoglycemic drugs: Secondary | ICD-10-CM | POA: Diagnosis not present

## 2024-06-05 DIAGNOSIS — R0683 Snoring: Secondary | ICD-10-CM

## 2024-06-05 DIAGNOSIS — F1910 Other psychoactive substance abuse, uncomplicated: Secondary | ICD-10-CM

## 2024-06-05 DIAGNOSIS — M1990 Unspecified osteoarthritis, unspecified site: Secondary | ICD-10-CM | POA: Diagnosis not present

## 2024-06-05 DIAGNOSIS — F419 Anxiety disorder, unspecified: Secondary | ICD-10-CM | POA: Insufficient documentation

## 2024-06-05 DIAGNOSIS — E782 Mixed hyperlipidemia: Secondary | ICD-10-CM

## 2024-06-05 DIAGNOSIS — F32A Depression, unspecified: Secondary | ICD-10-CM | POA: Diagnosis not present

## 2024-06-05 DIAGNOSIS — Z79899 Other long term (current) drug therapy: Secondary | ICD-10-CM | POA: Insufficient documentation

## 2024-06-05 DIAGNOSIS — Z794 Long term (current) use of insulin: Secondary | ICD-10-CM | POA: Insufficient documentation

## 2024-06-05 DIAGNOSIS — R0602 Shortness of breath: Secondary | ICD-10-CM | POA: Diagnosis present

## 2024-06-05 DIAGNOSIS — G4730 Sleep apnea, unspecified: Secondary | ICD-10-CM | POA: Insufficient documentation

## 2024-06-05 DIAGNOSIS — F17210 Nicotine dependence, cigarettes, uncomplicated: Secondary | ICD-10-CM | POA: Diagnosis not present

## 2024-06-05 DIAGNOSIS — F1011 Alcohol abuse, in remission: Secondary | ICD-10-CM | POA: Diagnosis not present

## 2024-06-05 DIAGNOSIS — K8020 Calculus of gallbladder without cholecystitis without obstruction: Secondary | ICD-10-CM | POA: Diagnosis not present

## 2024-06-05 DIAGNOSIS — K760 Fatty (change of) liver, not elsewhere classified: Secondary | ICD-10-CM | POA: Insufficient documentation

## 2024-06-05 MED ORDER — FUROSEMIDE 40 MG PO TABS: ORAL_TABLET | ORAL | 2 refills | Status: AC

## 2024-06-05 NOTE — Patient Instructions (Signed)
 Medication Changes:  DECREASE Furosemide  to 80mg  (1 tab) every morning AND 40mg  (1/2 tab) every evening  Lab Work:   Go downstairs to [facility] on [address] to have your blood work completed.  We will only call you if the results are abnormal or if the provider would like to make medication changes.  No news is good news.     Testing/Procedures:  You have been ordered a sleep study. This will be done in facility and they will contact you in order to set up an appointment.    Follow-Up in: Please follow up with the [facility] in 1 month with Ellouise Class, FNP.   Thank you for choosing [facility].    At the [facility], you and your health needs are our priority. We have a designated team specialized in the treatment of Heart Failure. This Care Team includes your primary Heart Failure Specialized Cardiologist (physician), Advanced Practice Providers (APPs- Physician Assistants and Nurse Practitioners), and Pharmacist who all work together to provide you with the care you need, when you need it.   You may see any of the following providers on your designated Care Team at your next follow up:  Dr. Toribio Fuel Dr. Ezra Shuck Dr. Ria Commander Dr. Morene Brownie Ellouise Class, FNP Jaun Bash, RPH-CPP  Please be sure to bring in all your medications bottles to every appointment.   Need to Contact Us :  If you have any questions or concerns before your next appointment please send us  a message through [place] or call our office at [phone].    TO LEAVE A MESSAGE FOR THE NURSE SELECT OPTION 2, PLEASE LEAVE A MESSAGE INCLUDING: YOUR NAME DATE OF BIRTH CALL BACK NUMBER REASON FOR CALL**this is important as we prioritize the call backs  YOU WILL RECEIVE A CALL BACK THE SAME DAY AS LONG AS YOU CALL BEFORE 4:00 PM

## 2024-06-06 ENCOUNTER — Telehealth: Payer: Self-pay

## 2024-06-06 ENCOUNTER — Ambulatory Visit: Payer: Self-pay | Admitting: Family

## 2024-06-06 LAB — LIPID PANEL
Chol/HDL Ratio: 2.6 ratio (ref 0.0–4.4)
Cholesterol, Total: 138 mg/dL (ref 100–199)
HDL: 53 mg/dL (ref >39)
LDL Chol Calc (NIH): 66 mg/dL (ref 0–99)
Triglycerides: 104 mg/dL (ref 0–149)
VLDL Cholesterol Cal: 19 mg/dL (ref 5–40)

## 2024-06-06 LAB — BASIC METABOLIC PANEL WITH GFR
BUN/Creatinine Ratio: 17 (ref 9–23)
BUN: 10 mg/dL (ref 6–24)
CO2: 25 mmol/L (ref 20–29)
Calcium: 10.3 mg/dL — ABNORMAL HIGH (ref 8.7–10.2)
Chloride: 97 mmol/L (ref 96–106)
Creatinine, Ser: 0.6 mg/dL (ref 0.57–1.00)
Glucose: 150 mg/dL — ABNORMAL HIGH (ref 70–99)
Potassium: 4.3 mmol/L (ref 3.5–5.2)
Sodium: 137 mmol/L (ref 134–144)
eGFR: 105 mL/min/1.73 (ref >59)

## 2024-06-06 LAB — MAGNESIUM: Magnesium: 1.6 mg/dL (ref 1.6–2.3)

## 2024-06-06 MED ORDER — MAGNESIUM 400 MG PO CAPS: 400.0000 mg | ORAL_CAPSULE | Freq: Two times a day (BID) | ORAL | 3 refills | Status: AC

## 2024-06-06 NOTE — Telephone Encounter (Signed)
-----   Message from Ellouise DELENA Class sent at 06/06/2024  7:27 AM EST ----- Kidney function normal. Lipids look great. Magnesium  is still a bit low. Increase magnesium  to 1 tablet BID.

## 2024-06-06 NOTE — Telephone Encounter (Signed)
 Referral for sleep study faxed to [facility] with ov note and demos.

## 2024-06-06 NOTE — Telephone Encounter (Signed)
 Pt's RN at [facility] is aware and agreeable.  Result notes with provider recommendation also faxed to facility.  New magnesium  prescription sent to pharmacy.

## 2024-07-06 ENCOUNTER — Telehealth: Payer: Self-pay | Admitting: Family

## 2024-07-06 ENCOUNTER — Encounter: Payer: Self-pay | Admitting: Gastroenterology

## 2024-07-06 ENCOUNTER — Ambulatory Visit (INDEPENDENT_AMBULATORY_CARE_PROVIDER_SITE_OTHER): Admitting: Gastroenterology

## 2024-07-06 VITALS — BP 102/68 | HR 106 | Temp 97.8°F | Ht 69.0 in | Wt 162.8 lb

## 2024-07-06 DIAGNOSIS — A09 Infectious gastroenteritis and colitis, unspecified: Secondary | ICD-10-CM

## 2024-07-06 DIAGNOSIS — K529 Noninfective gastroenteritis and colitis, unspecified: Secondary | ICD-10-CM | POA: Diagnosis not present

## 2024-07-06 DIAGNOSIS — R1031 Right lower quadrant pain: Secondary | ICD-10-CM | POA: Diagnosis not present

## 2024-07-06 DIAGNOSIS — Z8619 Personal history of other infectious and parasitic diseases: Secondary | ICD-10-CM

## 2024-07-06 DIAGNOSIS — R197 Diarrhea, unspecified: Secondary | ICD-10-CM | POA: Insufficient documentation

## 2024-07-06 DIAGNOSIS — R1319 Other dysphagia: Secondary | ICD-10-CM

## 2024-07-06 DIAGNOSIS — K219 Gastro-esophageal reflux disease without esophagitis: Secondary | ICD-10-CM

## 2024-07-06 DIAGNOSIS — R10A1 Flank pain, right side: Secondary | ICD-10-CM | POA: Insufficient documentation

## 2024-07-06 DIAGNOSIS — K625 Hemorrhage of anus and rectum: Secondary | ICD-10-CM | POA: Insufficient documentation

## 2024-07-06 MED ORDER — PANTOPRAZOLE SODIUM 40 MG PO TBEC: 40.0000 mg | DELAYED_RELEASE_TABLET | Freq: Every day | ORAL | 3 refills | Status: DC

## 2024-07-06 NOTE — Progress Notes (Signed)
 "    GI Office Note    Referring Provider: Earvin Johnston PARAS, FNP Primary Care Physician:  Earvin Johnston PARAS, FNP  Primary Gastroenterologist: Carlin POUR. Cindie, DO   Chief Complaint   Chief Complaint  Patient presents with   Abdominal Pain    Lower right side abdominal pain and issues with loose stools and intermittent fecal incontinence     History of Present Illness   Nancy [name] is a 59 y.o. female presenting today at the request of Dr. Delilah for right flank pain.   Patient currently resides at [facility] She has history of several recent hospital admissions.  Admission 01/2024 with left-sided pyelonephritis. She has history of alcohol and cocaine abuse.  Presented to the hospital 03/2024 with suspected colicky abdominal pain, presenting from SNF.  Extensive workup as outlined below.  General surgery recommended outpatient follow-up as there was no evidence of acute cholecystitis and patient was being treated for pneumonia.  She had elevated LFTs at the time but no evidence of choledocholithiasis.  Noted to have gallbladder neck gallstone.  During admission she also tested positive for C. difficile and was given 10-day course of vancomycin .  Required antibiotic coverage for healthcare associated pneumonia and aspiration.  Discussed the use of AI scribe software for clinical note transcription with the patient, who gave verbal consent to proceed.  History of Present Illness   She has had persistent diarrhea for about 6 months with no formed stools. She has multiple bowel movements daily, including nocturnal episodes with fecal incontinence that soil clothing and bedding. Stools start thick and muddy then become watery, green, and foul-smelling. Dietary restriction, including skipping dinner, has not improved symptoms. Intermittent Imodium sometimes leads to constipation. She feels markedly fatigued and weak and is unable to rest because of symptoms.   She has  received several antibiotic courses in recent months, most recently for a urinary tract infection.  She notes occasional small amounts of bright blood on tissue after wiping and intermittent dark brown discharge in her pull-ups that she thinks could be old blood. She has right-sided abdominal pain that can radiate to the groin, worse at night and partially relieved by pressure or repositioning. She describes it as annoying and sometimes jabbing, present for about 5-6 months. She also has burning pain in the left buttock.  She has daily heartburn and indigestion and sometimes results in vomiting.  She has limited access to antacids at her facility and uses gas chewables or Pepto-Bismol when available but they have been out recently. She reports dysphagia with a sensation of food sitting in her chest.   Planned bilateral hip replacements and possible pelvic surgery are on hold because of her current health. She has avoided alcohol, cocaine, and cannabis for the past 6-8 months. She is unaware of any family history of liver disease or colon cancer but notes that she is really not familiar with much of her family history.      Prior Data   Results       Latest Ref Rng & Units 06/05/2024   11:52 AM 04/10/2024    1:12 PM 03/22/2024    4:39 AM  BMP  Glucose 70 - 99 mg/dL 849  90  784   BUN 6 - 24 mg/dL 10  24  11    Creatinine 0.57 - 1.00 mg/dL 9.39  9.51  9.51   BUN/Creat Ratio 9 - 23 17     Sodium 134 - 144 mmol/L 137  138  135   Potassium 3.5 - 5.2 mmol/L 4.3  3.6  3.9   Chloride 96 - 106 mmol/L 97  99  94   CO2 20 - 29 mmol/L 25  27  29    Calcium  8.7 - 10.2 mg/dL 89.6  9.9  9.8       Latest Ref Rng & Units 03/22/2024    4:39 AM 03/21/2024    4:35 AM 03/20/2024    4:47 AM  CBC  WBC 4.0 - 10.5 K/uL 8.2  8.7  8.7   Hemoglobin 12.0 - 15.0 g/dL 88.0  88.9  89.3   Hematocrit 36.0 - 46.0 % 36.7  33.1  32.5   Platelets 150 - 400 K/uL 372  313  303       Latest Ref Rng & Units 03/22/2024     4:39 AM 03/21/2024    4:35 AM 03/20/2024    4:47 AM  Hepatic Function  Total Protein 6.5 - 8.1 g/dL 7.0  6.4  6.1   Albumin 3.5 - 5.0 g/dL 4.0  3.7  3.1   AST 15 - 41 U/L 33  30  44   ALT 0 - 44 U/L 74  77  88   Alk Phosphatase 38 - 126 U/L 177  179  165   Total Bilirubin 0.0 - 1.2 mg/dL 0.4  0.4  0.5    Lab Results  Component Value Date   LIPASE 29 03/18/2024    HIDA March 20, 2024: Exam was terminated after only 10 minutes imaging due to claustrophobia. IMPRESSION: 1. Limited exam due to short duration imaging as above. 2. Patent common bile duct. 3. Suggestion of early filling of the gallbladder.  MRI abdomen MRCP with and without contrast March 18, 2024: IMPRESSION: 1. Large gallstone without evidence acute cholecystitis. 2. No choledocholithiasis. 3. No biliary duct dilatation. 4. Normal pancreas. 5. Bibasilar effusions and atelectasis.  Right upper quadrant ultrasound March 18, 2024: IMPRESSION: 1. Large shadowing gallstone within the gallbladder neck measuring 3.4 cm. The gallbladder wall is mildly thickened, measuring 3.5 mm. No pericholecystic fluid or sonographic murphy sign. 2. Mildly dilated common bile duct measuring 9 mm without visible choledocholithiasis in the visualized proximal segment; correlate with liver function tests and consider further evaluation with MRCP if clinically indicated. 3. Increased hepatic parenchymal echogenicity, compatible with hepatic steatosis. 4. Small right pleural effusion incidentally noted.  CT renal stone study March 18, 2024: IMPRESSION: 1. No hydronephrosis or obstructing nephrolithiasis. 2. Circumferential wall thickening of the bladder with faint adjacent fat stranding. Recommend correlation with urinalysis to exclude cystitis. 3. Large cholelithiasis with distended gallbladder. There is a single focus of fat stranding along the inferior margin of the gallbladder, nonspecific in a background of  anasarca and favored to be incidental. If there is persistent clinical concern for acute cholecystitis, HIDA scan would be the modality of choice for further evaluation. 4. Small bilateral pleural effusions with bibasilar heterogeneous airspace opacities and bronchial wall thickening. Findings are favored to reflect pulmonary edema with superimposed atelectasis. 5. Linear horizontal lucency traversing the RIGHT sacrum. This could reflect a nondisplaced fracture versus prominent nutrient foramen. Recommend correlation with point tenderness. 6. Prominent retroperitoneal and inguinal adenopathy, nonspecific and possibly reactive.  Medications   Current Outpatient Medications  Medication Sig Dispense Refill   Accu-Chek Softclix Lancets lancets Use as instructed 100 each 12   acetaminophen  (TYLENOL ) 500 MG tablet Take 2 tablets (1,000 mg total) by mouth every 6 (six) hours as needed.  albuterol  (VENTOLIN  HFA) 108 (90 Base) MCG/ACT inhaler Inhale 1-2 puffs into the lungs every 4 (four) hours as needed for wheezing or shortness of breath. 18 g 2   atorvastatin  (LIPITOR) 40 MG tablet Take 1 tablet (40 mg total) by mouth daily. 90 tablet 3   buPROPion (WELLBUTRIN XL) 150 MG 24 hr tablet Take 150 mg by mouth daily.     calcium  carbonate (TUMS - DOSED IN MG ELEMENTAL CALCIUM ) 500 MG chewable tablet Chew 1 tablet by mouth every 4 (four) hours as needed for indigestion or heartburn.     cycloSPORINE (RESTASIS) 0.05 % ophthalmic emulsion Place 1 drop into both eyes 2 (two) times daily.     ertugliflozin L-PyroglutamicAc (STEGLATRO) 15 MG TABS tablet Take 15 mg by mouth daily.     fluticasone (FLONASE) 50 MCG/ACT nasal spray Place 1 spray into both nostrils daily.     furosemide  (LASIX ) 40 MG tablet Take 2 tablets (80 mg total) by mouth every morning AND 1 tablet (40 mg total) every evening. 135 tablet 2   gabapentin  (NEURONTIN ) 800 MG tablet Take 800 mg by mouth 4 (four) times daily.     Glucose  Blood (BLOOD GLUCOSE TEST STRIPS) STRP Use to check blood sugar 4 (four) times daily -  before meals and at bedtime. 200 strip 3   insulin  aspart (NOVOLOG ) 100 UNIT/ML injection Inject 0-15 Units into the skin 3 (three) times daily with meals.     insulin  glargine (LANTUS ) 100 UNIT/ML injection Inject 0.4 mLs (40 Units total) into the skin 2 (two) times daily.     Insulin  Pen Needle (PEN NEEDLES) 31G X 5 MM MISC Use with insulin  4 (four) times daily -  before meals and at bedtime. 100 each 3   lamoTRIgine  (LAMICTAL ) 25 MG tablet Take 1 tablet (25 mg total) by mouth 2 (two) times daily. X one week, then increase to 50 mg bid (Patient taking differently: Take 150 mg by mouth 2 (two) times daily.)     lidocaine  (LIDODERM ) 5 % Place 1 patch onto the skin daily. Remove & Discard patch within 12 hours or as directed by MD     loperamide (IMODIUM) 2 MG capsule Take 2 mg by mouth as needed for diarrhea or loose stools.     loratadine (CLARITIN) 10 MG tablet Take 10 mg by mouth daily.     Magnesium  400 MG CAPS Take 400 mg by mouth 2 (two) times daily. 180 capsule 3   melatonin 3 MG TABS tablet Take 3 mg by mouth at bedtime.     mirtazapine (REMERON SOL-TAB) 30 MG disintegrating tablet Take 30 mg by mouth at bedtime.     Multiple Vitamin (MULTIVITAMIN WITH MINERALS) TABS tablet Take 1 tablet by mouth daily.     naltrexone (DEPADE) 50 MG tablet Take 50 mg by mouth daily.     polyethylene glycol powder (GLYCOLAX ) 17 GM/SCOOP powder Take 17 g by mouth daily.     senna (SENOKOT) 8.6 MG tablet Take 2 tablets by mouth 2 (two) times daily.     spironolactone  (ALDACTONE ) 25 MG tablet Take 1 tablet (25 mg total) by mouth daily. 90 tablet 3   thiamine  (VITAMIN B-1) 100 MG tablet Take 1 tablet (100 mg total) by mouth daily.     tizanidine (ZANAFLEX) 2 MG capsule Take 2 mg by mouth 3 (three) times daily.     No current facility-administered medications for this visit.    Allergies   Allergies as of 07/06/2024 -  Review Complete 07/06/2024  Allergen Reaction Noted   Glucophage  [metformin ] Diarrhea 01/25/2024    Past Medical History   Past Medical History:  Diagnosis Date   Abnormal Pap smear    Bronchitis    Depression    Diabetes (HCC)    GERD (gastroesophageal reflux disease)    tums prn   Headache(784.0)    Polysubstance abuse (HCC)     Past Surgical History   Past Surgical History:  Procedure Laterality Date   CERVICAL CONIZATION W/BX  12/13/2011   Procedure: CONIZATION CERVIX WITH BIOPSY;  Surgeon: Harland JAYSON Birkenhead, MD;  Location: [facility];  Service: Gynecology;  Laterality: N/A;   DENTAL SURGERY      Past Family History   Family History  Problem Relation Age of Onset   Cancer Mother        unsure    Past Social History   Social History   Socioeconomic History   Marital status: Single    Spouse name: Not on file   Number of children: Not on file   Years of education: Not on file   Highest education level: 9th grade  Occupational History   Not on file  Tobacco Use   Smoking status: Every Day    Current packs/day: 0.50    Average packs/day: 0.6 packs/day for 69.0 years (44.5 ttl pk-yrs)    Types: Cigarettes    Start date: 8   Smokeless tobacco: Never  Vaping Use   Vaping status: Unknown  Substance and Sexual Activity   Alcohol use: Not Currently    Comment: 40 ounces of beer daily prior to SNF   Drug use: Not Currently    Types: Crack cocaine, Marijuana    Comment: Pills(Vicodin) heroin (states no today 09/04/15)   Sexual activity: Not Currently    Birth control/protection: None    Comment: Pt in rehab for drug and alcohol use  Other Topics Concern   Not on file  Social History Narrative   Not on file   Social Drivers of Health   Tobacco Use: High Risk (07/06/2024)   Patient History    Smoking Tobacco Use: Every Day    Smokeless Tobacco Use: Never    Passive Exposure: Not on file  Financial Resource Strain: Not on File (10/08/2021)   Received from  [employer]    Financial Resource Strain: 0  Food Insecurity: No Food Insecurity (03/20/2024)   Epic    Worried About Programme Researcher, Broadcasting/film/video in the Last Year: Never true    [employer] of Food in the Last Year: Never true  Recent Concern: Food Insecurity - Food Insecurity Present (01/24/2024)   Epic    Worried About Programme Researcher, Broadcasting/film/video in the Last Year: Often true    Ran Out of Food in the Last Year: Often true  Transportation Needs: Unmet Transportation Needs (03/18/2024)   Epic    Lack of Transportation (Medical): Yes    Lack of Transportation (Non-Medical): Yes  Physical Activity: Not on File (10/08/2021)   Received from [facility]   Physical Activity    Physical Activity: 0  Stress: Not on File (10/08/2021)   Received from [facility]   Stress    Stress: 0  Social Connections: Socially Isolated (03/18/2024)   Social Connection and Isolation Panel    Frequency of Communication with Friends and Family: Once a week    Frequency of Social Gatherings with Friends and Family: Never    Attends Religious Services:  Never    Active Member of Clubs or Organizations: No    Attends Banker Meetings: Never    Marital Status: Separated  Intimate Partner Violence: Not At Risk (03/18/2024)   Epic    Fear of Current or Ex-Partner: No    Emotionally Abused: No    Physically Abused: No    Sexually Abused: No  Depression (PHQ2-9): Not on file  Alcohol Screen: Not on file  Housing: Unknown (03/20/2024)   Epic    Unable to Pay for Housing in the Last Year: No    Number of Times Moved in the Last Year: Not on file    Homeless in the Last Year: No  Recent Concern: Housing - High Risk (03/18/2024)   Epic    Unable to Pay for Housing in the Last Year: Yes    Number of Times Moved in the Last Year: 1    Homeless in the Last Year: Yes  Utilities: At Risk (03/18/2024)   Epic    Threatened with loss of utilities: Yes  Health Literacy: Not on file    Review of Systems   General:  Negative for anorexia, weight loss, fever, chills, fatigue, +weakness. Eyes: Negative for vision changes.  ENT: Negative for hoarseness, difficulty swallowing , nasal congestion. CV: Negative for chest pain, angina, palpitations, dyspnea on exertion, peripheral edema.  Respiratory: Negative for dyspnea at rest, dyspnea on exertion, cough, sputum, wheezing.  GI: See history of present illness. GU:  Negative for dysuria, hematuria, urinary incontinence, urinary frequency, nocturnal urination. Has to stand to urinate MS: +back/hip pain.  Derm: Negative for rash or itching.  Neuro: Negative for weakness, abnormal sensation, seizure, frequent headaches, memory loss,  confusion.  Psych: Negative for  suicidal ideation, hallucinations.  Endo: Negative for unusual weight change.  Heme: Negative for bruising or bleeding. Allergy: Negative for rash or hives.  Physical Exam   BP 102/68   Pulse (!) 106   Temp 97.8 F (36.6 C) (Oral)   Ht 5' 9 (1.753 m)   Wt 162 lb 12.8 oz (73.8 kg)   LMP  (LMP Unknown)   SpO2 92%   BMI 24.04 kg/m    General: Well-nourished, well-developed in no acute distress. Presents in wheelchair but able to stand and limited ambulation. Unable to get on exam table. Head: Normocephalic, atraumatic.   Eyes: Conjunctiva pink, no icterus. Mouth: Oropharyngeal mucosa moist and pink  Neck: Supple without thyromegaly, masses, or lymphadenopathy.  Lungs: Clear to auscultation bilaterally.  Heart: Regular rate and rhythm, no murmurs rubs or gallops.  Abdomen: Bowel sounds are normal, nontender, nondistended, no hepatosplenomegaly or masses,  no abdominal bruits or hernia, no rebound or guarding.   Rectal: not performed Extremities: No lower extremity edema. No clubbing or deformities.  Neuro: Alert and oriented x 4 , grossly normal neurologically.  Skin: Warm and dry, no rash or jaundice.   Psych: Alert and cooperative, normal mood and affect.  Labs   See  above  Imaging Studies   No results found.  Assessment/Plan:   Assessment & Plan Chronic diarrhea with history of Cdiff and repeated antibiotic use - Ordered stool studies for C. Difficile, GI pathogens. - TSH, TTG IgA, IgA, sed rate, CRP, CBC, CMET - stool for fecal elastase and calprotectin - Discussed colonoscopy if stool studies negative.   Gastroesophageal reflux disease Chronic reflux with dysphagia and emesis, inadequately managed with OTC therapy, requires prescription. - Pantoprazole  40mg  daily before breakfast  Chronic right lower quadrant  abdominal pain Chronic right lower quadrant pain, possibly musculoskeletal. Her pain if right flank, reproducible with palpation, goes around to the back. Unclear if related to her severe hip OA. This pain does not seem to be biliary at this time.  - will manage diarrhea for now - if she has any dysuria symptoms, SNF provider can rule out UTI -if she develops postprandial RUQ pain, consider follow up with general surgery to consider elective cholecystectomy       Sonny RAMAN. Ezzard, MHS, PA-C [facility]  "

## 2024-07-06 NOTE — Patient Instructions (Addendum)
 Complete stool tests: fecal calprotectin, GI profile, fecal elastase, Cdiff toxins A/B with reflex  Complete lab tests: CBC, CMET, TSH/free T4, Sed rate, CRP, TTG IgA, IgA  Start pantoprazole  40mg  daily before breakfast for acid reflux. RX sent.

## 2024-07-06 NOTE — Telephone Encounter (Signed)
 Called to confirm/remind patient of their appointment at [facility] on 07/09/24.   Appointment:   [] Confirmed  [] Left mess   [x] No answer/No voice mail  [] VM Full/unable to leave message  [] Phone not in service  Patient reminded to bring all medications and/or complete list.  Confirmed patient has transportation. Gave directions, instructed to utilize valet parking.

## 2024-07-08 NOTE — Progress Notes (Unsigned)
 "  Advanced [facility] Failure Clinic Note   Referring Physician: PCP: Earvin Johnston PARAS, FNP Cardiologist: None   Chief Complaint:   HPI:  Nancy [name] is a 59 yo female with a history of alcohol use, substance abuse, hepatic steatosis, uncontrolled T2DM, DKA,OA, HLD, asthma, depression, anxiety, and cholelithiasis.   ED visit 10/25/23 for left sided chest pain and pressure. Labs showed blood glucose of 604, anion gap of 14, hydroxybutyrate acid of 3.8, and ketones urine. Diagnosed with DKA. Troponin and CXR negative. EKG NSR. Given IV insulin  and discharged.   Echo 10/26/23 EF: 55-60%   ED visit 01/04/24 and 01/24/24 for falls.   Echo 02/11/24 EF: 60-65% with MVD, trivial MR, and AVR. Mobile calcified material with non-coronary cusp.   ED visit 03/18/24 with presentation of hypoxemia O2 of 84%, RA and hypogylcemia with blood sugar of 33. Diagnosed with acute respiratory failure with hypoxia. CT Chest showed bilateral pleural effusions and moderate pulmonary edema, with no PE. Abdominal US  showed large gallstone without cholelithiasis. Given IV and oral antibiotics.    Seen in [facility] 05/01/24 where spironolactone  25mg  daily was started with reduction of furosemide  to 80mg  daily.   She presents today for a HF follow-up visit with a chief complaint of     Plans to have hip surgery once A1C <7.    Using less salt than she was previously and says that she can now taste the salt in some foods. Currently resides in nursing rehab facility in [place]. Medications are managed by facility.   Remains on naltrexone.   ROS: All systems negative except what is listed in HPI, PMH and Problem List   Past Medical History:  Diagnosis Date   Abnormal Pap smear    Bronchitis    Depression    Diabetes (HCC)    GERD (gastroesophageal reflux disease)    tums prn   Headache(784.0)    Opioid dependence (HCC)    Polysubstance abuse (HCC)     Current Outpatient Medications  Medication Sig Dispense Refill    Accu-Chek Softclix Lancets lancets Use as instructed 100 each 12   acetaminophen  (TYLENOL ) 500 MG tablet Take 2 tablets (1,000 mg total) by mouth every 6 (six) hours as needed.     albuterol  (VENTOLIN  HFA) 108 (90 Base) MCG/ACT inhaler Inhale 1-2 puffs into the lungs every 4 (four) hours as needed for wheezing or shortness of breath. 18 g 2   atorvastatin  (LIPITOR) 40 MG tablet Take 1 tablet (40 mg total) by mouth daily. 90 tablet 3   buPROPion (WELLBUTRIN XL) 150 MG 24 hr tablet Take 150 mg by mouth daily.     calcium  carbonate (TUMS - DOSED IN MG ELEMENTAL CALCIUM ) 500 MG chewable tablet Chew 1 tablet by mouth every 4 (four) hours as needed for indigestion or heartburn.     cycloSPORINE (RESTASIS) 0.05 % ophthalmic emulsion Place 1 drop into both eyes 2 (two) times daily.     ertugliflozin L-PyroglutamicAc (STEGLATRO) 15 MG TABS tablet Take 15 mg by mouth daily.     fluticasone (FLONASE) 50 MCG/ACT nasal spray Place 1 spray into both nostrils daily.     furosemide  (LASIX ) 40 MG tablet Take 2 tablets (80 mg total) by mouth every morning AND 1 tablet (40 mg total) every evening. 135 tablet 2   gabapentin  (NEURONTIN ) 800 MG tablet Take 800 mg by mouth 4 (four) times daily.     Glucose Blood (BLOOD GLUCOSE TEST STRIPS) STRP Use to check blood sugar 4 (four)  times daily -  before meals and at bedtime. 200 strip 3   insulin  aspart (NOVOLOG ) 100 UNIT/ML injection Inject 0-15 Units into the skin 3 (three) times daily with meals.     insulin  glargine (LANTUS ) 100 UNIT/ML injection Inject 0.4 mLs (40 Units total) into the skin 2 (two) times daily.     Insulin  Pen Needle (PEN NEEDLES) 31G X 5 MM MISC Use with insulin  4 (four) times daily -  before meals and at bedtime. 100 each 3   lamoTRIgine  (LAMICTAL ) 25 MG tablet Take 1 tablet (25 mg total) by mouth 2 (two) times daily. X one week, then increase to 50 mg bid (Patient taking differently: Take 150 mg by mouth 2 (two) times daily.)     lidocaine  (LIDODERM ) 5  % Place 1 patch onto the skin daily. Remove & Discard patch within 12 hours or as directed by MD     loperamide (IMODIUM) 2 MG capsule Take 2 mg by mouth as needed for diarrhea or loose stools.     loratadine (CLARITIN) 10 MG tablet Take 10 mg by mouth daily.     Magnesium  400 MG CAPS Take 400 mg by mouth 2 (two) times daily. 180 capsule 3   melatonin 3 MG TABS tablet Take 3 mg by mouth at bedtime.     mirtazapine (REMERON SOL-TAB) 30 MG disintegrating tablet Take 30 mg by mouth at bedtime.     Multiple Vitamin (MULTIVITAMIN WITH MINERALS) TABS tablet Take 1 tablet by mouth daily.     naltrexone (DEPADE) 50 MG tablet Take 50 mg by mouth daily.     pantoprazole  (PROTONIX ) 40 MG tablet Take 1 tablet (40 mg total) by mouth daily before breakfast. 30 tablet 3   polyethylene glycol powder (GLYCOLAX ) 17 GM/SCOOP powder Take 17 g by mouth daily.     senna (SENOKOT) 8.6 MG tablet Take 2 tablets by mouth 2 (two) times daily.     spironolactone  (ALDACTONE ) 25 MG tablet Take 1 tablet (25 mg total) by mouth daily. 90 tablet 3   thiamine  (VITAMIN B-1) 100 MG tablet Take 1 tablet (100 mg total) by mouth daily.     tizanidine (ZANAFLEX) 2 MG capsule Take 2 mg by mouth 3 (three) times daily.     No current facility-administered medications for this visit.    Allergies  Allergen Reactions   Glucophage  [Metformin ] Diarrhea      Social History   Socioeconomic History   Marital status: Single    Spouse name: Not on file   Number of children: Not on file   Years of education: Not on file   Highest education level: 9th grade  Occupational History   Not on file  Tobacco Use   Smoking status: Every Day    Current packs/day: 0.50    Average packs/day: 0.6 packs/day for 69.0 years (44.5 ttl pk-yrs)    Types: Cigarettes    Start date: 89   Smokeless tobacco: Never  Vaping Use   Vaping status: Unknown  Substance and Sexual Activity   Alcohol use: Not Currently    Comment: 40 ounces of beer daily  prior to SNF   Drug use: Not Currently    Types: Crack cocaine, Marijuana    Comment: Pills(Vicodin) heroin (states no today 09/04/15)   Sexual activity: Not Currently    Birth control/protection: None    Comment: Pt in rehab for drug and alcohol use  Other Topics Concern   Not on file  Social History Narrative  Not on file   Social Drivers of Health   Tobacco Use: High Risk (07/06/2024)   Patient History    Smoking Tobacco Use: Every Day    Smokeless Tobacco Use: Never    Passive Exposure: Not on file  Financial Resource Strain: Not on File (10/08/2021)   Received from [employer]    Financial Resource Strain: 0  Food Insecurity: No Food Insecurity (03/20/2024)   Epic    Worried About Programme Researcher, Broadcasting/film/video in the Last Year: Never true    [employer] of Food in the Last Year: Never true  Recent Concern: Food Insecurity - Food Insecurity Present (01/24/2024)   Epic    Worried About Programme Researcher, Broadcasting/film/video in the Last Year: Often true    Ran Out of Food in the Last Year: Often true  Transportation Needs: Unmet Transportation Needs (03/18/2024)   Epic    Lack of Transportation (Medical): Yes    Lack of Transportation (Non-Medical): Yes  Physical Activity: Not on File (10/08/2021)   Received from [facility]   Physical Activity    Physical Activity: 0  Stress: Not on File (10/08/2021)   Received from [facility]   Stress    Stress: 0  Social Connections: Socially Isolated (03/18/2024)   Social Connection and Isolation Panel    Frequency of Communication with Friends and Family: Once a week    Frequency of Social Gatherings with Friends and Family: Never    Attends Religious Services: Never    Database Administrator or Organizations: No    Attends Banker Meetings: Never    Marital Status: Separated  Intimate Partner Violence: Not At Risk (03/18/2024)   Epic    Fear of Current or Ex-Partner: No    Emotionally Abused: No    Physically Abused: No    Sexually  Abused: No  Depression (PHQ2-9): Not on file  Alcohol Screen: Not on file  Housing: Unknown (03/20/2024)   Epic    Unable to Pay for Housing in the Last Year: No    Number of Times Moved in the Last Year: Not on file    Homeless in the Last Year: No  Recent Concern: Housing - High Risk (03/18/2024)   Epic    Unable to Pay for Housing in the Last Year: Yes    Number of Times Moved in the Last Year: 1    Homeless in the Last Year: Yes  Utilities: At Risk (03/18/2024)   Epic    Threatened with loss of utilities: Yes  Health Literacy: Not on file      Family History  Problem Relation Age of Onset   Cancer Mother        unsure, maybe cervix      PHYSICAL EXAM:  General: Well appearing female in wheelchair. Appears older than stated age  Cor: No JVD. Regular rhythm, tachycardic.  Lungs: clear Abdomen: soft, nontender, nondistended. Extremities: 1+ pitting edema bilateral lower legs Neuro:. Affect pleasant  ECG: Not done   ASSESSMENT & PLAN:  1: HFpEF- - suspect due to alcohol and substance abuse? - NYHA II - euvolemic  - Echo 02/11/24 EF: 60-65% with MVD, trivial MR, and AVR. Mobile calcified material with non-coronary cusp.  - weight 163 pounds from last visit here 1 month ago - maintain fluid restriction 60-64 oz and sodium restriction 2000mg   - continue spironolactone  25mg  daily.   - continue furosemide  80mg  QAM and begin 40mg  PM - A1c  is now <10 but has recently been treated for yeast infection so will defer SGLT2 at this time  - BMET 06/05/24 reviewed: sodium: 137, potassium 4.3, creatnine: 0.6, and GFR 105 - BNP 03/19/24 was 54 - BP 128/81  2. T2DM - history of uncontrolled DM with hyperglycemia  - continue insulin  and close glucose monitoring   - A1C 8.5% on 03/27/24 from SNF.   3. Substance abuse  - has begun detoxying ~ 1 month ago  - currently on naltrexone   - smokes on average 1ppd , smoking cessation encouraged and discussed nicotine  patches  - No  alcohol or substance use in 5 months   4. HLD - continue atorvastatin  40mg  daily  - LDL 06/05/24 was 66. Improved from 165 on 9/20   5. Muscle spasms  - Mg 06/05/24 was 1.6.  - continue magnesium  400mg  daily.   6. Sleep apnea  - endorses snoring - will fax order to sleep lab      Ellouise DELENA Class FNP-C 07/08/24  "

## 2024-07-09 ENCOUNTER — Ambulatory Visit: Attending: Family | Admitting: Family

## 2024-07-09 ENCOUNTER — Telehealth (HOSPITAL_COMMUNITY): Payer: Self-pay

## 2024-07-09 ENCOUNTER — Other Ambulatory Visit (HOSPITAL_COMMUNITY): Payer: Self-pay

## 2024-07-09 ENCOUNTER — Encounter: Payer: Self-pay | Admitting: Family

## 2024-07-09 VITALS — BP 103/66 | HR 97 | Wt 166.1 lb

## 2024-07-09 DIAGNOSIS — E119 Type 2 diabetes mellitus without complications: Secondary | ICD-10-CM | POA: Insufficient documentation

## 2024-07-09 DIAGNOSIS — F1910 Other psychoactive substance abuse, uncomplicated: Secondary | ICD-10-CM | POA: Diagnosis not present

## 2024-07-09 DIAGNOSIS — E785 Hyperlipidemia, unspecified: Secondary | ICD-10-CM | POA: Insufficient documentation

## 2024-07-09 DIAGNOSIS — F17210 Nicotine dependence, cigarettes, uncomplicated: Secondary | ICD-10-CM | POA: Diagnosis not present

## 2024-07-09 DIAGNOSIS — R0683 Snoring: Secondary | ICD-10-CM

## 2024-07-09 DIAGNOSIS — Z5982 Transportation insecurity: Secondary | ICD-10-CM | POA: Insufficient documentation

## 2024-07-09 DIAGNOSIS — Z79899 Other long term (current) drug therapy: Secondary | ICD-10-CM | POA: Diagnosis not present

## 2024-07-09 DIAGNOSIS — I5032 Chronic diastolic (congestive) heart failure: Secondary | ICD-10-CM

## 2024-07-09 DIAGNOSIS — E782 Mixed hyperlipidemia: Secondary | ICD-10-CM | POA: Diagnosis not present

## 2024-07-09 DIAGNOSIS — Z7984 Long term (current) use of oral hypoglycemic drugs: Secondary | ICD-10-CM | POA: Diagnosis not present

## 2024-07-09 DIAGNOSIS — Z794 Long term (current) use of insulin: Secondary | ICD-10-CM | POA: Diagnosis not present

## 2024-07-09 DIAGNOSIS — J45909 Unspecified asthma, uncomplicated: Secondary | ICD-10-CM | POA: Insufficient documentation

## 2024-07-09 DIAGNOSIS — M6281 Muscle weakness (generalized): Secondary | ICD-10-CM | POA: Diagnosis not present

## 2024-07-09 DIAGNOSIS — E1165 Type 2 diabetes mellitus with hyperglycemia: Secondary | ICD-10-CM | POA: Diagnosis not present

## 2024-07-09 DIAGNOSIS — K760 Fatty (change of) liver, not elsewhere classified: Secondary | ICD-10-CM | POA: Insufficient documentation

## 2024-07-09 DIAGNOSIS — G4730 Sleep apnea, unspecified: Secondary | ICD-10-CM | POA: Diagnosis not present

## 2024-07-09 LAB — BASIC METABOLIC PANEL WITH GFR
BUN/Creatinine Ratio: 17 (ref 9–23)
BUN: 14 mg/dL (ref 6–24)
CO2: 25 mmol/L (ref 20–29)
Calcium: 10 mg/dL (ref 8.7–10.2)
Chloride: 97 mmol/L (ref 96–106)
Creatinine, Ser: 0.81 mg/dL (ref 0.57–1.00)
Glucose: 137 mg/dL — ABNORMAL HIGH (ref 70–99)
Potassium: 4.2 mmol/L (ref 3.5–5.2)
Sodium: 139 mmol/L (ref 134–144)
eGFR: 85 mL/min/1.73

## 2024-07-09 LAB — MAGNESIUM: Magnesium: 2.2 mg/dL (ref 1.6–2.3)

## 2024-07-09 MED ORDER — DAPAGLIFLOZIN PROPANEDIOL 10 MG PO TABS: 10.0000 mg | ORAL_TABLET | Freq: Every day | ORAL | 11 refills | Status: AC

## 2024-07-09 NOTE — Telephone Encounter (Signed)
 Called [phone]) to f/u with referral for sleep study and Left message to call back

## 2024-07-09 NOTE — Patient Instructions (Signed)
 Medication Changes:  START Farxiga  10mg  daily  Lab Work:   Go downstairs to [facility] on [address] to have your blood work completed.  We will only call you if the results are abnormal or if the provider would like to make medication changes.  No news is good news.   Follow-Up in: Please follow up with the Advance [facility] failure Clinic in 1 month with Ellouise Class, FNP.   Thank you for choosing [facility].    At the Advanced [facility] Failure Clinic, you and your health needs are our priority. We have a designated team specialized in the treatment of Heart Failure. This Care Team includes your primary Heart Failure Specialized Cardiologist (physician), Advanced Practice Providers (APPs- Physician Assistants and Nurse Practitioners), and Pharmacist who all work together to provide you with the care you need, when you need it.   You may see any of the following providers on your designated Care Team at your next follow up:  Dr. Toribio Fuel Dr. Ezra Shuck Dr. Ria Commander Dr. Morene Brownie Ellouise Class, FNP Jaun Bash, RPH-CPP  Please be sure to bring in all your medications bottles to every appointment.   Need to Contact Us :  If you have any questions or concerns before your next appointment please send us  a message through [place] or call our office at [phone].    TO LEAVE A MESSAGE FOR THE NURSE SELECT OPTION 2, PLEASE LEAVE A MESSAGE INCLUDING: YOUR NAME DATE OF BIRTH CALL BACK NUMBER REASON FOR CALL**this is important as we prioritize the call backs  YOU WILL RECEIVE A CALL BACK THE SAME DAY AS LONG AS YOU CALL BEFORE 4:00 PM

## 2024-07-10 ENCOUNTER — Ambulatory Visit: Payer: Self-pay | Admitting: Family

## 2024-07-13 ENCOUNTER — Other Ambulatory Visit (HOSPITAL_COMMUNITY): Payer: Self-pay

## 2024-07-13 NOTE — Telephone Encounter (Signed)
 Advanced Heart Failure Patient Advocate Encounter  Prior authorization for Farxiga  (Key [id number]) is not showing up in Medicaid system. Phone prior auth submitted 07/13/2024 PA ID [id number].  Will continue to follow up.  Rachel DEL, CPhT Rx Patient Advocate Phone: [phone]

## 2024-07-13 NOTE — Telephone Encounter (Signed)
 Test billing returns $0 copay for 90 day supply, indicating prior auth has been approved (07/13/2024 until 07/12/2025)  Contacted pharmacy to reprocess, pharmacy stated rx from 01/19 was not received. Messaged office staff to resend rx. Patient number 'can not be completed as dialed' at this time, unable to inform patient.  Rachel DEL, CPhT Rx Patient Advocate Phone: [phone]

## 2024-08-09 ENCOUNTER — Ambulatory Visit: Admitting: Family
